# Patient Record
Sex: Female | Born: 1945 | Race: White | Hispanic: No | Smoking: Former smoker
Health system: Southern US, Community
[De-identification: ages and names within clinical notes are randomized; demographics above are authoritative.]

## PROBLEM LIST (undated history)

## (undated) DIAGNOSIS — I639 Cerebral infarction, unspecified: Secondary | ICD-10-CM

## (undated) DIAGNOSIS — G4730 Sleep apnea, unspecified: Secondary | ICD-10-CM

## (undated) DIAGNOSIS — E785 Hyperlipidemia, unspecified: Secondary | ICD-10-CM

## (undated) DIAGNOSIS — G20 Parkinson's disease: Secondary | ICD-10-CM

## (undated) DIAGNOSIS — F32A Depression, unspecified: Secondary | ICD-10-CM

## (undated) DIAGNOSIS — T4145XA Adverse effect of unspecified anesthetic, initial encounter: Secondary | ICD-10-CM

## (undated) DIAGNOSIS — N390 Urinary tract infection, site not specified: Secondary | ICD-10-CM

## (undated) DIAGNOSIS — K219 Gastro-esophageal reflux disease without esophagitis: Secondary | ICD-10-CM

## (undated) DIAGNOSIS — E559 Vitamin D deficiency, unspecified: Secondary | ICD-10-CM

## (undated) DIAGNOSIS — G459 Transient cerebral ischemic attack, unspecified: Secondary | ICD-10-CM

## (undated) DIAGNOSIS — R112 Nausea with vomiting, unspecified: Secondary | ICD-10-CM

## (undated) DIAGNOSIS — G20A1 Parkinson's disease without dyskinesia, without mention of fluctuations: Secondary | ICD-10-CM

## (undated) DIAGNOSIS — F329 Major depressive disorder, single episode, unspecified: Secondary | ICD-10-CM

## (undated) DIAGNOSIS — Z9889 Other specified postprocedural states: Secondary | ICD-10-CM

## (undated) DIAGNOSIS — F419 Anxiety disorder, unspecified: Secondary | ICD-10-CM

## (undated) DIAGNOSIS — T8859XA Other complications of anesthesia, initial encounter: Secondary | ICD-10-CM

## (undated) DIAGNOSIS — M1990 Unspecified osteoarthritis, unspecified site: Secondary | ICD-10-CM

## (undated) DIAGNOSIS — I10 Essential (primary) hypertension: Secondary | ICD-10-CM

## (undated) DIAGNOSIS — R4702 Dysphasia: Secondary | ICD-10-CM

## (undated) HISTORY — PX: WRIST SURGERY: SHX841

## (undated) HISTORY — PX: BACK SURGERY: SHX140

## (undated) HISTORY — PX: VAGINAL HYSTERECTOMY: SUR661

## (undated) HISTORY — DX: Gastro-esophageal reflux disease without esophagitis: K21.9

## (undated) HISTORY — PX: FOOT SURGERY: SHX648

## (undated) HISTORY — PX: OTHER SURGICAL HISTORY: SHX169

## (undated) HISTORY — DX: Parkinson's disease without dyskinesia, without mention of fluctuations: G20.A1

## (undated) HISTORY — DX: Parkinson's disease: G20

## (undated) HISTORY — PX: CARPAL TUNNEL RELEASE: SHX101

## (undated) HISTORY — DX: Transient cerebral ischemic attack, unspecified: G45.9

## (undated) HISTORY — PX: NECK SURGERY: SHX720

## (undated) HISTORY — PX: BURR HOLE W/ STEREOTACTIC INSERTION OF DBS LEADS / INTRAOP MICROELECTRODE RECORDING: SUR171

## (undated) HISTORY — PX: ELBOW SURGERY: SHX618

---

## 2000-02-21 ENCOUNTER — Ambulatory Visit (HOSPITAL_COMMUNITY): Admission: RE | Admit: 2000-02-21 | Discharge: 2000-02-21 | Payer: Self-pay | Admitting: Cardiology

## 2001-02-02 ENCOUNTER — Ambulatory Visit (HOSPITAL_BASED_OUTPATIENT_CLINIC_OR_DEPARTMENT_OTHER): Admission: RE | Admit: 2001-02-02 | Discharge: 2001-02-02 | Payer: Self-pay | Admitting: Orthopedic Surgery

## 2005-01-28 ENCOUNTER — Inpatient Hospital Stay (HOSPITAL_COMMUNITY): Admission: RE | Admit: 2005-01-28 | Discharge: 2005-02-02 | Payer: Self-pay | Admitting: Neurosurgery

## 2006-03-16 ENCOUNTER — Ambulatory Visit (HOSPITAL_COMMUNITY): Admission: RE | Admit: 2006-03-16 | Discharge: 2006-03-16 | Payer: Self-pay | Admitting: Neurosurgery

## 2006-05-07 ENCOUNTER — Ambulatory Visit (HOSPITAL_COMMUNITY): Admission: RE | Admit: 2006-05-07 | Discharge: 2006-05-07 | Payer: Self-pay | Admitting: Neurosurgery

## 2006-05-08 ENCOUNTER — Inpatient Hospital Stay (HOSPITAL_COMMUNITY): Admission: RE | Admit: 2006-05-08 | Discharge: 2006-05-09 | Payer: Self-pay | Admitting: Neurosurgery

## 2006-05-25 ENCOUNTER — Ambulatory Visit (HOSPITAL_COMMUNITY): Admission: RE | Admit: 2006-05-25 | Discharge: 2006-05-26 | Payer: Self-pay | Admitting: Neurosurgery

## 2007-09-23 ENCOUNTER — Ambulatory Visit (HOSPITAL_COMMUNITY): Admission: RE | Admit: 2007-09-23 | Discharge: 2007-09-23 | Payer: Self-pay | Admitting: Neurology

## 2007-10-01 ENCOUNTER — Ambulatory Visit (HOSPITAL_COMMUNITY): Admission: RE | Admit: 2007-10-01 | Discharge: 2007-10-01 | Payer: Self-pay | Admitting: Neurology

## 2007-12-06 ENCOUNTER — Ambulatory Visit: Payer: Self-pay | Admitting: Cardiology

## 2007-12-13 ENCOUNTER — Ambulatory Visit: Payer: Self-pay | Admitting: Cardiology

## 2008-01-04 ENCOUNTER — Ambulatory Visit: Payer: Self-pay | Admitting: Cardiology

## 2008-07-31 ENCOUNTER — Ambulatory Visit (HOSPITAL_COMMUNITY): Admission: RE | Admit: 2008-07-31 | Discharge: 2008-07-31 | Payer: Self-pay | Admitting: Neurology

## 2008-11-21 ENCOUNTER — Encounter: Admission: RE | Admit: 2008-11-21 | Discharge: 2008-11-21 | Payer: Self-pay | Admitting: Neurosurgery

## 2009-01-04 ENCOUNTER — Encounter: Admission: RE | Admit: 2009-01-04 | Discharge: 2009-01-04 | Payer: Self-pay | Admitting: Neurosurgery

## 2009-02-14 DIAGNOSIS — R079 Chest pain, unspecified: Secondary | ICD-10-CM | POA: Insufficient documentation

## 2009-02-14 DIAGNOSIS — E785 Hyperlipidemia, unspecified: Secondary | ICD-10-CM | POA: Insufficient documentation

## 2009-02-14 DIAGNOSIS — I10 Essential (primary) hypertension: Secondary | ICD-10-CM | POA: Insufficient documentation

## 2009-02-14 DIAGNOSIS — R0602 Shortness of breath: Secondary | ICD-10-CM | POA: Insufficient documentation

## 2009-02-14 DIAGNOSIS — E782 Mixed hyperlipidemia: Secondary | ICD-10-CM | POA: Insufficient documentation

## 2009-12-28 ENCOUNTER — Ambulatory Visit (HOSPITAL_COMMUNITY)
Admission: RE | Admit: 2009-12-28 | Discharge: 2009-12-28 | Payer: Self-pay | Source: Home / Self Care | Admitting: Neurosurgery

## 2010-06-15 ENCOUNTER — Encounter: Payer: Self-pay | Admitting: Neurosurgery

## 2010-06-16 ENCOUNTER — Encounter: Payer: Self-pay | Admitting: Neurology

## 2010-06-16 ENCOUNTER — Encounter: Payer: Self-pay | Admitting: Neurosurgery

## 2010-06-24 ENCOUNTER — Inpatient Hospital Stay (HOSPITAL_COMMUNITY)
Admission: EM | Admit: 2010-06-24 | Discharge: 2010-06-28 | DRG: 690 | Disposition: A | Discharge status: Home Health | Payer: Medicare PPO | Attending: Internal Medicine | Admitting: Internal Medicine

## 2010-06-24 DIAGNOSIS — G20 Parkinson's disease: Secondary | ICD-10-CM | POA: Diagnosis present

## 2010-06-24 DIAGNOSIS — N10 Acute tubulo-interstitial nephritis: Principal | ICD-10-CM | POA: Diagnosis present

## 2010-06-24 DIAGNOSIS — F3289 Other specified depressive episodes: Secondary | ICD-10-CM | POA: Diagnosis present

## 2010-06-24 DIAGNOSIS — A498 Other bacterial infections of unspecified site: Secondary | ICD-10-CM | POA: Diagnosis present

## 2010-06-24 DIAGNOSIS — E876 Hypokalemia: Secondary | ICD-10-CM | POA: Diagnosis present

## 2010-06-24 DIAGNOSIS — E871 Hypo-osmolality and hyponatremia: Secondary | ICD-10-CM | POA: Diagnosis present

## 2010-06-24 DIAGNOSIS — K219 Gastro-esophageal reflux disease without esophagitis: Secondary | ICD-10-CM | POA: Diagnosis present

## 2010-06-24 DIAGNOSIS — D696 Thrombocytopenia, unspecified: Secondary | ICD-10-CM | POA: Diagnosis not present

## 2010-06-24 DIAGNOSIS — I10 Essential (primary) hypertension: Secondary | ICD-10-CM | POA: Diagnosis present

## 2010-06-24 DIAGNOSIS — F329 Major depressive disorder, single episode, unspecified: Secondary | ICD-10-CM | POA: Diagnosis present

## 2010-06-24 DIAGNOSIS — G20A1 Parkinson's disease without dyskinesia, without mention of fluctuations: Secondary | ICD-10-CM | POA: Diagnosis present

## 2010-06-24 LAB — URINALYSIS, ROUTINE W REFLEX MICROSCOPIC
Bilirubin Urine: NEGATIVE
Ketones, ur: NEGATIVE mg/dL
Nitrite: POSITIVE — AB
Specific Gravity, Urine: 1.02 (ref 1.005–1.030)
Urine Glucose, Fasting: NEGATIVE mg/dL
Urobilinogen, UA: 0.2 mg/dL (ref 0.0–1.0)
pH: 6 (ref 5.0–8.0)

## 2010-06-24 LAB — COMPREHENSIVE METABOLIC PANEL
ALT: 10 U/L (ref 0–35)
AST: 19 U/L (ref 0–37)
Albumin: 3 g/dL — ABNORMAL LOW (ref 3.5–5.2)
Alkaline Phosphatase: 94 U/L (ref 39–117)
BUN: 9 mg/dL (ref 6–23)
CO2: 27 mEq/L (ref 19–32)
Calcium: 9 mg/dL (ref 8.4–10.5)
Chloride: 97 mEq/L (ref 96–112)
Creatinine, Ser: 0.8 mg/dL (ref 0.4–1.2)
GFR calc Af Amer: >60 mL/min (ref >60)
GFR calc non Af Amer: >60 mL/min (ref >60)
Glucose, Bld: 107 mg/dL — ABNORMAL HIGH (ref 70–99)
Potassium: 3.2 mEq/L — ABNORMAL LOW (ref 3.5–5.1)
Sodium: 133 mEq/L — ABNORMAL LOW (ref 135–145)
Total Bilirubin: 0.8 mg/dL (ref 0.3–1.2)
Total Protein: 6.6 g/dL (ref 6.0–8.3)

## 2010-06-24 LAB — DIFFERENTIAL
Basophils Absolute: 0 10*3/uL (ref 0.0–0.1)
Basophils Relative: 0 % (ref 0–1)
Eosinophils Absolute: 0.1 10*3/uL (ref 0.0–0.7)
Eosinophils Relative: 1 % (ref 0–5)
Lymphocytes Relative: 15 % (ref 12–46)
Lymphs Abs: 1.5 10*3/uL (ref 0.7–4.0)
Monocytes Absolute: 0.7 10*3/uL (ref 0.1–1.0)
Monocytes Relative: 8 % (ref 3–12)
Neutro Abs: 7.4 10*3/uL (ref 1.7–7.7)
Neutrophils Relative %: 76 % (ref 43–77)

## 2010-06-24 LAB — URINE MICROSCOPIC-ADD ON

## 2010-06-24 LAB — CBC
HCT: 39.2 % (ref 36.0–46.0)
Hemoglobin: 14 g/dL (ref 12.0–15.0)
MCH: 31.8 pg (ref 26.0–34.0)
MCHC: 35.7 g/dL (ref 30.0–36.0)
MCV: 89.1 fL (ref 78.0–100.0)
Platelets: 176 10*3/uL (ref 150–400)
RBC: 4.4 MIL/uL (ref 3.87–5.11)
RDW: 11.9 % (ref 11.5–15.5)
WBC: 9.7 10*3/uL (ref 4.0–10.5)

## 2010-06-24 LAB — MRSA PCR SCREENING: MRSA by PCR: NEGATIVE

## 2010-06-25 LAB — DIFFERENTIAL
Basophils Absolute: 0 10*3/uL (ref 0.0–0.1)
Basophils Relative: 0 % (ref 0–1)
Eosinophils Absolute: 0 10*3/uL (ref 0.0–0.7)
Eosinophils Relative: 0 % (ref 0–5)
Lymphocytes Relative: 18 % (ref 12–46)
Lymphs Abs: 1.5 10*3/uL (ref 0.7–4.0)
Monocytes Absolute: 0.8 10*3/uL (ref 0.1–1.0)
Monocytes Relative: 10 % (ref 3–12)
Neutro Abs: 5.7 10*3/uL (ref 1.7–7.7)
Neutrophils Relative %: 72 % (ref 43–77)

## 2010-06-25 LAB — CBC
HCT: 34.8 % — ABNORMAL LOW (ref 36.0–46.0)
Hemoglobin: 12.4 g/dL (ref 12.0–15.0)
MCH: 31.9 pg (ref 26.0–34.0)
MCHC: 35.6 g/dL (ref 30.0–36.0)
MCV: 89.5 fL (ref 78.0–100.0)
Platelets: 155 10*3/uL (ref 150–400)
RBC: 3.89 MIL/uL (ref 3.87–5.11)
RDW: 12 % (ref 11.5–15.5)
WBC: 8 10*3/uL (ref 4.0–10.5)

## 2010-06-25 LAB — BASIC METABOLIC PANEL
BUN: 7 mg/dL (ref 6–23)
CO2: 24 mEq/L (ref 19–32)
Calcium: 8.5 mg/dL (ref 8.4–10.5)
Chloride: 102 mEq/L (ref 96–112)
Creatinine, Ser: 0.92 mg/dL (ref 0.4–1.2)
GFR calc Af Amer: >60 mL/min (ref >60)
GFR calc non Af Amer: >60 mL/min (ref >60)
Glucose, Bld: 109 mg/dL — ABNORMAL HIGH (ref 70–99)
Potassium: 3.8 mEq/L (ref 3.5–5.1)
Sodium: 133 mEq/L — ABNORMAL LOW (ref 135–145)

## 2010-06-26 LAB — CBC
HCT: 35.6 % — ABNORMAL LOW (ref 36.0–46.0)
Hemoglobin: 12.5 g/dL (ref 12.0–15.0)
MCH: 31.5 pg (ref 26.0–34.0)
MCHC: 35.1 g/dL (ref 30.0–36.0)
MCV: 89.7 fL (ref 78.0–100.0)
Platelets: 149 10*3/uL — ABNORMAL LOW (ref 150–400)
RBC: 3.97 MIL/uL (ref 3.87–5.11)
RDW: 11.9 % (ref 11.5–15.5)
WBC: 6.8 10*3/uL (ref 4.0–10.5)

## 2010-06-26 LAB — URINE CULTURE
Colony Count: >=100000
Culture  Setup Time: 201201301700

## 2010-06-26 LAB — COMPREHENSIVE METABOLIC PANEL
ALT: <8 U/L (ref 0–35)
AST: 23 U/L (ref 0–37)
Albumin: 2.6 g/dL — ABNORMAL LOW (ref 3.5–5.2)
Alkaline Phosphatase: 73 U/L (ref 39–117)
BUN: 7 mg/dL (ref 6–23)
CO2: 28 mEq/L (ref 19–32)
Calcium: 8.9 mg/dL (ref 8.4–10.5)
Chloride: 99 mEq/L (ref 96–112)
Creatinine, Ser: 0.94 mg/dL (ref 0.4–1.2)
GFR calc Af Amer: >60 mL/min (ref >60)
GFR calc non Af Amer: 60 mL/min — ABNORMAL LOW (ref >60)
Glucose, Bld: 93 mg/dL (ref 70–99)
Potassium: 3.8 mEq/L (ref 3.5–5.1)
Sodium: 134 mEq/L — ABNORMAL LOW (ref 135–145)
Total Bilirubin: 0.6 mg/dL (ref 0.3–1.2)
Total Protein: 6 g/dL (ref 6.0–8.3)

## 2010-06-26 LAB — SEDIMENTATION RATE: Sed Rate: 61 mm/hr — ABNORMAL HIGH (ref 0–22)

## 2010-06-27 LAB — CBC
HCT: 35.3 % — ABNORMAL LOW (ref 36.0–46.0)
Hemoglobin: 12.4 g/dL (ref 12.0–15.0)
MCH: 31.1 pg (ref 26.0–34.0)
MCHC: 35.1 g/dL (ref 30.0–36.0)
MCV: 88.5 fL (ref 78.0–100.0)
Platelets: 183 10*3/uL (ref 150–400)
RBC: 3.99 MIL/uL (ref 3.87–5.11)
RDW: 11.7 % (ref 11.5–15.5)
WBC: 5 10*3/uL (ref 4.0–10.5)

## 2010-06-27 LAB — BASIC METABOLIC PANEL
BUN: 7 mg/dL (ref 6–23)
CO2: 27 mEq/L (ref 19–32)
Calcium: 8.8 mg/dL (ref 8.4–10.5)
Chloride: 98 mEq/L (ref 96–112)
Creatinine, Ser: 0.77 mg/dL (ref 0.4–1.2)
GFR calc Af Amer: >60 mL/min (ref >60)
GFR calc non Af Amer: >60 mL/min (ref >60)
Glucose, Bld: 133 mg/dL — ABNORMAL HIGH (ref 70–99)
Potassium: 3.4 mEq/L — ABNORMAL LOW (ref 3.5–5.1)
Sodium: 134 mEq/L — ABNORMAL LOW (ref 135–145)

## 2010-06-27 LAB — MAGNESIUM: Magnesium: 1.8 mg/dL (ref 1.5–2.5)

## 2010-06-27 LAB — DIFFERENTIAL
Basophils Absolute: 0 10*3/uL (ref 0.0–0.1)
Basophils Relative: 1 % (ref 0–1)
Eosinophils Absolute: 0.1 10*3/uL (ref 0.0–0.7)
Eosinophils Relative: 2 % (ref 0–5)
Lymphocytes Relative: 24 % (ref 12–46)
Lymphs Abs: 1.2 10*3/uL (ref 0.7–4.0)
Monocytes Absolute: 0.6 10*3/uL (ref 0.1–1.0)
Monocytes Relative: 13 % — ABNORMAL HIGH (ref 3–12)
Neutro Abs: 3.1 10*3/uL (ref 1.7–7.7)
Neutrophils Relative %: 61 % (ref 43–77)

## 2010-06-27 LAB — VITAMIN B12: Vitamin B-12: 557 pg/mL (ref 211–911)

## 2010-06-28 LAB — DIFFERENTIAL
Basophils Absolute: 0.1 10*3/uL (ref 0.0–0.1)
Basophils Relative: 1 % (ref 0–1)
Eosinophils Absolute: 0.2 10*3/uL (ref 0.0–0.7)
Eosinophils Relative: 3 % (ref 0–5)
Lymphocytes Relative: 22 % (ref 12–46)
Lymphs Abs: 1.3 10*3/uL (ref 0.7–4.0)
Monocytes Absolute: 0.8 10*3/uL (ref 0.1–1.0)
Monocytes Relative: 14 % — ABNORMAL HIGH (ref 3–12)
Neutro Abs: 3.5 10*3/uL (ref 1.7–7.7)
Neutrophils Relative %: 60 % (ref 43–77)

## 2010-06-28 LAB — BASIC METABOLIC PANEL
BUN: 6 mg/dL (ref 6–23)
CO2: 29 mEq/L (ref 19–32)
Calcium: 9 mg/dL (ref 8.4–10.5)
Chloride: 102 mEq/L (ref 96–112)
Creatinine, Ser: 0.74 mg/dL (ref 0.4–1.2)
GFR calc Af Amer: >60 mL/min (ref >60)
GFR calc non Af Amer: >60 mL/min (ref >60)
Glucose, Bld: 93 mg/dL (ref 70–99)
Potassium: 3.5 mEq/L (ref 3.5–5.1)
Sodium: 138 mEq/L (ref 135–145)

## 2010-06-28 LAB — CBC
HCT: 33.2 % — ABNORMAL LOW (ref 36.0–46.0)
Hemoglobin: 11.9 g/dL — ABNORMAL LOW (ref 12.0–15.0)
MCH: 31.6 pg (ref 26.0–34.0)
MCHC: 35.8 g/dL (ref 30.0–36.0)
MCV: 88.3 fL (ref 78.0–100.0)
Platelets: 183 10*3/uL (ref 150–400)
RBC: 3.76 MIL/uL — ABNORMAL LOW (ref 3.87–5.11)
RDW: 11.8 % (ref 11.5–15.5)
WBC: 5.8 10*3/uL (ref 4.0–10.5)

## 2010-06-28 LAB — MAGNESIUM: Magnesium: 1.8 mg/dL (ref 1.5–2.5)

## 2010-06-30 LAB — CULTURE, BLOOD (ROUTINE X 2)
Culture: NO GROWTH
Culture: NO GROWTH

## 2010-07-03 NOTE — H&P (Signed)
NAME:  Courtney Tucker, Courtney Tucker NO.:  [id number]  MEDICAL RECORD NO.:  [id number]           PATIENT TYPE:  LOCATION:                                 FACILITY:  PHYSICIAN:  Jeoffrey Massed, MD    DATE OF BIRTH:  1946/05/02  DATE OF ADMISSION: DATE OF DISCHARGE:  LH                             HISTORY & PHYSICAL   PRIMARY CARE PRACTITIONER:  Dr. Samuel Jester in [place], [place].  CHIEF COMPLAINT:  Lower abdominal pain and fever for the past 4-5 days.  HISTORY OF PRESENT ILLNESS:  The patient is a very pleasant 65 year old white female with a past medical history of Parkinson disease, hypertension, questionable ascites, TIAs, history of chronic low back pain status post 2 back surgeries was brought in with the above- mentioned complaint.  Per patient for the past 4-5 days, she has mostly been having lower abdominal pain which she describes as sharp in nature around 6/10 in intensity with no radiation.  It has no particular aggravating or relieving factors as well.  The patient does notice some amount of dysuria and polyuria with nocturia as well.  She claims that she also has a chronic low back pain; however, for the past 4-5 days, the back pain has been much worse than usual.  She describes the pain all over her lower back and not just on her mid back or nor just located to bilateral flank region.  The patient does give a history of intermittent cough; however, there is no history of chest pain or shortness of breath or palpitations.  She also gives a history of on and off headaches for the past 4-5 days.  She claims that she occasionally has loose stools, but has had none today as well.  She does give a history of nausea with no vomiting to me.  She was evaluated in the emergency room, found to be febrile and a urinalysis is suggestive of a urinary tract infection and the hospitalist service has now been asked to admit this patient for further evaluation  and treatment.  ALLERGIES: 1. ASPIRIN. 2. CODEINE.  PAST MEDICAL HISTORY:  Significant for; 1. Parkinson disease. 2. Hypertension. 3. Gastroesophageal reflux disease. 4. Depression. 5. Anxiety disorder. 6. Chronic low back pain. 7. TIA. 8. Dyslipidemia. 9. Chronic urinary incontinence.  PAST SURGICAL HISTORY: 1. L5-S1 laminectomy. 2. Deep brain stimulator implantation. 3. Hysterectomy. 4. Cholecystectomy.  MEDICATIONS AT HOME: 1. Sinemet 25/100 one tablet p.o. t.i.d. 2. Mirapex 0.5 mg 1 tablet p.o. t.i.d. 3. Nexium 40 mg p.o. daily. 4. Benicar 40 mg p.o. daily. 5. Plavix 75 mg p.o. daily. 6. Xanax 0.5 mg p.o. t.i.d. p.r.n. 7. Hydrocortisone/acetaminophen p.r.n. 8. Premarin at an unknown dose. 9. Oxybutynin at an unknown dose.  FAMILY HISTORY:  Her mother had some sort of brain tumor in her 28s. Father had coronary artery disease in his 49s.  SOCIAL HISTORY:  The patient lives with husband and denies any toxic habits.  REVIEW OF SYSTEMS:  A detailed review of 12 systems was done and these are negative except for the ones mentioned in the HPI.  PHYSICAL EXAMINATION:  VITAL SIGNS:  Initial  vital signs showed a temperature of 99.9, heart rate of 91, blood pressure 165/60, respiration of 20 and pulse ox of 95% on 2 L.  T-max in the emergency room is 101.5 degrees Fahrenheit. GENERAL:  Elderly white female lying in bed, does not appear to be in any distress.  She is awake, alert, following all commands and moving all of her extremities. HEENT:  Atraumatic, normocephalic.  Pupils equal, react to light accommodation. NECK:  Supple. CHEST:  Bilaterally clear to auscultation. CARDIOVASCULAR:  Heart sounds are regular.  No murmur is heard. ABDOMEN:  Soft.  There are good bowel sounds heard and there is diffuse lower abdominal tenderness particularly in the suprapubic area.  The rest of the abdomen is very minimally tender without any rigidity, rebound or  guarding. EXTREMITIES:  There is no edema.  She has intact sensation.  She has strong bilateral pedal pulses, which is symmetrical. NEUROLOGY:  The patient is alert, awake and oriented x3.  She is following all commands and easily able to give most of the history.  She has no focal deficits on exam.  She has intact sensation particularly in her lower extremities. MUSCULOSKELETAL:  There is diffuse tenderness in her midline in the lower back as well as in her bilateral flank region.  However, CVA angles are not tender.  LABORATORY DATA: 1. CBC shows a WBC of 9.7, hemoglobin of 14.0, hematocrit of 39.2 and     platelet count of 176. 2. Chemistry shows a sodium of 133, potassium of 3.2, chloride of 97,     bicarb of 27, glucose of 107, BUN of 9, creatinine of 0.80. 3. Total bilirubin is 0.8, alkaline phosphatase is 94, AST is 19, ALT     is 10, total protein is 6.6, albumin is 3.0 and a calcium of 9.0. 4. Urinalyses shows positive nitrites, moderate leukocytes and     microscopic analyzes shows many bacteria and too numerous to count     WBCs.  RADIOLOGICAL STUDIES:  CT of the head without contrast shows no acute intracranial abnormalities.ASSESSMENT: 1. Likely pyelonephritis. 2. History of Parkinson's, appears stable. 3. History of hypertension, relatively well controlled currently.  PLAN: 1. The patient will be admitted to regular floor. 2. She will be gently hydrated and we will provide Rocephin for her     urinary tract infection. 3. Urine cultures have already been sent to the emergency room.  If     she continues to have fever, blood cultures will also be drawn. 4. A chest x-ray and x-ray, KUB of abdomen will also be obtained. 5. We will resume her Sinemet and Mirapex for Parkinson's. 6. We will continue her Benicar for her hypertension. 7. DVT prophylaxis with Lovenox. 8. Code status full code. 9. The patient will be monitored closely and followed on a day to day      basis.  Further plan will depend as the patient's clinical     situation evolves, further laboratory data as well.  Total time spent equals 45 minutes.     Jeoffrey Massed, MD     SG/MEDQ  D:  06/24/2010  T:  06/24/2010  Job:  [id number]  cc:   Samuel Jester Fax: [phone]  Electronically Signed by Jeoffrey Massed  on 07/03/2010 04:29:43 PM

## 2010-07-05 NOTE — Discharge Summary (Signed)
NAME:  Courtney Tucker, BIENAIME NO.:  [id number]  MEDICAL RECORD NO.:  [id number]           PATIENT TYPE:  LOCATION:                                 FACILITY:  PHYSICIAN:  Elliot Cousin, M.D.    DATE OF BIRTH:  December 30, 1945  DATE OF ADMISSION:  06/24/2010 DATE OF DISCHARGE:  02/03/2012LH                              DISCHARGE SUMMARY   DISCHARGE DIAGNOSES: 1. Escherichia coli acute pyelonephritis. 2. Hypertension. 3. Thrombocytopenia, likely secondary to infection, resolved. 4. Hypokalemia. 5. Hyponatremia. 6. Parkinson's disease. 7. Mild deconditioning. 8. Transient rash.  HCTZ was considered but not confirmed as the     etiology.  DISCHARGE MEDICATIONS: 1. Levaquin 500 mg daily for 4 more days. 2. Potassium chloride 20 mEq daily for 7 more days (the patients serum     potassium and blood magnesium levels will need to be reassessed in     1-2 weeks). 3. Benicar 40 mg daily. 4. Hydrocodone/APAP 5/500 mg 1 tablet every 4 hours as needed for     pain. 5. Mirapex 0.5 mg three times daily. 6. Nexium 40 mg daily. 7. Oxybutynin 5 mg four times daily. 8. Plavix 75 mg daily. 9. Premarin 0.3 mg daily. 10.Sinemet 25/100 mg 1 tablet three times daily. 11.Trilipix 135 mg daily. 12.Viibryd 40 mg at bedtime. 13.Vitamin D3 1 tablet daily. 14.Xanax 0.5 mg half a tablet to 1 tablet b.i.d.  DISCHARGE DISPOSITION:  The patient was discharged to home in improved and stable condition on June 28, 2010.  She will follow up with her primary care physician, Dr. Samuel Jester, in 1 week.  CONSULTATIONS:  None.  PROCEDURE PERFORMED: 1. Chest x-ray on June 24, 2010.  The results revealed no acute     findings. 2. Abdominal x-ray on June 24, 2010.  The results revealed no acute     findings.  HISTORY OF PRESENT ILLNESS:  The patient is a 65 year old woman with a past medical history significant for Parkinson's disease, hypertension, depression, and anxiety.  She  presented to the emergency department on June 24, 2010, with a chief complaint of lower abdominal pain, fever, and discomfort with urination.  When she was initially evaluated, she was noted to be mildly hypertensive and febrile with a temperature of 101.5.  Her lab data were significant for a urinalysis that revealed positive nitrites and leukocytes.  She was admitted for further evaluation and management.  HOSPITAL COURSE: 1. E. COLI PYELONEPHRITIS.  A urine culture was ordered.  She received     1 g of Rocephin in the emergency department.  She was subsequently     continued on Rocephin.  After a couple of days of therapy with     Rocephin, the patient became febrile again.  The antibiotic was     changed to Ancef.  The sensitivities revealed that the E. coli was     sensitive to Levaquin.  Antibiotic therapy was subsequently changed     to oral Levaquin.  She became afebrile and remained afebrile during     the remainder of the hospitalization.  Blood cultures which had     been ordered on  admission remained negative.  She improved     symptomatically.  She was discharged to home on 4 more days of     antibiotic therapy with Levaquin after receiving 5 days of     treatment in the hospital. 2. THROMBOCYTOPENIA.  The patient's platelet count was 176 on     admission.  It decreased to a nadir of 149.  A vitamin B12 level     was ordered and it was within normal limits at 557.  Her platelet     count normalized to 183 prior to discharge. 3. HYPERTENSION.  The patient's blood pressure increased off of     Benicar.  Benicar was restarted.  Her blood pressure improved.     Hydrochlorothiazide was added empirically.  However, it was     discontinued as it may have caused a rash.  The patient did     develop a small rash on her back, which was treated with Benadryl     and a topical steroid.  It is unclear whether  the     hydrochlorothiazide was the culprit.  Nevertheless, it was      discontinued.  The patient's blood pressure improved as the Benicar     was increased back to her home dose. 4. HYPOKALEMIA AND HYPONATREMIA.  The patient's serum potassium was 3.2     and her serum sodium was 133 on admission.  She was repleted with     potassium chloride orally and in the IV fluids.  She was hydrated     with normal saline.  Her serum potassium improved to 3.5 and her     serum sodium improved to 138 prior to discharge.  She was     discharged on 7 more days of potassium chloride.  The patient's     potassium chloride level will need to be reassessed in 1-2 weeks.     This will be deferred to her primary care physician, Dr. Charm Barges. 5. PARKINSON'S DISEASE WITH DECONDITIONING.  The patient became     deconditioned due to the infection.  She was maintained on her     parkinsonian medications.  The physical therapist evaluated her.     Following the initial evaluation, the therapist     recommended skilled nursing facility short-term for strengthening.     However, over the course of the hospitalization, the patient's     strengthening and endurance improved.  The therapist no longer     recommended skilled nursing facility placement.  She rather     recommended home health physical therapy.  Home health physical     therapy was ordered.     Elliot Cousin, M.D.     DF/MEDQ  D:  06/28/2010  T:  06/29/2010  Job:  [id number]  cc:   Samuel Jester Fax: [phone]  Electronically Signed by Elliot Cousin M.D. on 07/05/2010 10:16:39 AM

## 2010-08-09 LAB — CBC
HCT: 40.4 % (ref 36.0–46.0)
Hemoglobin: 13.9 g/dL (ref 12.0–15.0)
MCH: 32.9 pg (ref 26.0–34.0)
MCHC: 34.4 g/dL (ref 30.0–36.0)
MCV: 95.7 fL (ref 78.0–100.0)
Platelets: 231 10*3/uL (ref 150–400)
RBC: 4.22 MIL/uL (ref 3.87–5.11)
RDW: 12.2 % (ref 11.5–15.5)
WBC: 6.6 10*3/uL (ref 4.0–10.5)

## 2010-08-09 LAB — BASIC METABOLIC PANEL
BUN: 15 mg/dL (ref 6–23)
CO2: 30 mEq/L (ref 19–32)
Calcium: 9.5 mg/dL (ref 8.4–10.5)
Chloride: 100 mEq/L (ref 96–112)
Creatinine, Ser: 0.71 mg/dL (ref 0.4–1.2)
GFR calc Af Amer: >60 mL/min (ref >60)
GFR calc non Af Amer: >60 mL/min (ref >60)
Glucose, Bld: 99 mg/dL (ref 70–99)
Potassium: 4.3 mEq/L (ref 3.5–5.1)
Sodium: 136 mEq/L (ref 135–145)

## 2010-08-09 LAB — SURGICAL PCR SCREEN
MRSA, PCR: NEGATIVE
Staphylococcus aureus: POSITIVE — AB

## 2010-08-09 LAB — PROTIME-INR
INR: 0.95 (ref 0.00–1.49)
Prothrombin Time: 12.9 seconds (ref 11.6–15.2)

## 2010-08-09 LAB — APTT: aPTT: 28 seconds (ref 24–37)

## 2010-10-01 ENCOUNTER — Emergency Department (HOSPITAL_COMMUNITY)
Admission: EM | Admit: 2010-10-01 | Discharge: 2010-10-01 | Disposition: A | Discharge status: Home / Self Care | Payer: Medicare PPO | Attending: Emergency Medicine | Admitting: Emergency Medicine

## 2010-10-01 ENCOUNTER — Emergency Department (HOSPITAL_COMMUNITY): Payer: Medicare PPO

## 2010-10-01 DIAGNOSIS — F3289 Other specified depressive episodes: Secondary | ICD-10-CM | POA: Insufficient documentation

## 2010-10-01 DIAGNOSIS — Z79899 Other long term (current) drug therapy: Secondary | ICD-10-CM | POA: Insufficient documentation

## 2010-10-01 DIAGNOSIS — G20 Parkinson's disease: Secondary | ICD-10-CM | POA: Insufficient documentation

## 2010-10-01 DIAGNOSIS — S8000XA Contusion of unspecified knee, initial encounter: Secondary | ICD-10-CM | POA: Insufficient documentation

## 2010-10-01 DIAGNOSIS — S7010XA Contusion of unspecified thigh, initial encounter: Secondary | ICD-10-CM | POA: Insufficient documentation

## 2010-10-01 DIAGNOSIS — G20A1 Parkinson's disease without dyskinesia, without mention of fluctuations: Secondary | ICD-10-CM | POA: Insufficient documentation

## 2010-10-01 DIAGNOSIS — Z7902 Long term (current) use of antithrombotics/antiplatelets: Secondary | ICD-10-CM | POA: Insufficient documentation

## 2010-10-01 DIAGNOSIS — Z8673 Personal history of transient ischemic attack (TIA), and cerebral infarction without residual deficits: Secondary | ICD-10-CM | POA: Insufficient documentation

## 2010-10-01 DIAGNOSIS — F329 Major depressive disorder, single episode, unspecified: Secondary | ICD-10-CM | POA: Insufficient documentation

## 2010-10-01 DIAGNOSIS — I10 Essential (primary) hypertension: Secondary | ICD-10-CM | POA: Insufficient documentation

## 2010-10-01 DIAGNOSIS — K219 Gastro-esophageal reflux disease without esophagitis: Secondary | ICD-10-CM | POA: Insufficient documentation

## 2010-10-02 ENCOUNTER — Ambulatory Visit (HOSPITAL_COMMUNITY)
Admit: 2010-10-02 | Discharge: 2010-10-02 | Disposition: A | Discharge status: Home / Self Care | Payer: Medicare PPO | Source: Ambulatory Visit | Attending: Emergency Medicine | Admitting: Emergency Medicine

## 2010-10-02 DIAGNOSIS — M79609 Pain in unspecified limb: Secondary | ICD-10-CM | POA: Insufficient documentation

## 2010-10-02 DIAGNOSIS — M7989 Other specified soft tissue disorders: Secondary | ICD-10-CM | POA: Insufficient documentation

## 2010-10-08 NOTE — Assessment & Plan Note (Signed)
[facility]                          [facility] NOTE   NAME:Courtney Tucker, Courtney Tucker                  MRN:          [id number]  DATE:01/04/2008                            DOB:          November 29, 1945    PRIMARY CARDIOLOGIST:  Luis Abed, MD, [facility]   REASON FOR VISIT:  Scheduled clinic follow up.  Please refer to my  initial consultation note of December 06, 2007, for full details.   At that time, Ms. Taffe was referred to [place] for further evaluation of  intermittent chest discomfort as well as refractory exertional dyspnea.  She had prior history of nonobstructive CAD by cardiac catheterization  in 2001.   We ordered a 2D echo and a dobutamine stress echocardiogram for risk  stratification.  2-D echo showed normal LVF with evidence of diastolic  dysfunction, and no significant valvular abnormalities.  Right  ventricular function was normal.   The dobutamine stress echo was also unremarkable, showing no evidence of  inducible wall motion abnormalities at peak stress.  There were no  diagnostic EKG changes.  Of note, the patient informs me today that she  did have some right-sided jaw discomfort during testing, but this is not  noted in the report.  She states that she similarly had jaw pain prior  to undergoing cardiac catheterization, by Dr. Andee Lineman, in 2001.  At that  time, Dr. Andee Lineman noted evidence of mildly elevated LVEDP with no wall  motion abnormalities.  He suggested further evaluation with pulmonary  function testing.  The patient tells me today that she has never had any  formal lung tests.   Clinically, she continues to have occasional chest pressure, but this is  not strictly correlated with exertion.  She has also not had any recent  jaw pain.  Although she had used some nitroglycerin prior to seeing me a  1 month ago, she has not used any since then.  She continues to have  significant exertional dyspnea and easy fatigability.   However, she  denies any PND, orthopnea, or significant lower extremity edema.   CURRENT MEDICATIONS:  Plavix, Sinemet, Cymbalta, Mirapex, Ditropan,  lisinopril/HCTZ 20/25 mg daily, Trilipix, and Zantac.   PHYSICAL EXAMINATION:  VITAL SIGNS:  Blood pressure 136/82, pulse 105  and regular, and weight 207.  GENERAL:  A 65 year old female, sitting upright, in no distress.  HEENT:  Normocephalic, atraumatic.  NECK:  Palpable carotid pulses; no JVD.  LUNGS:  Clear to auscultation in all fields.  HEART:  RRR (S1S2).  No significant murmurs.  No rubs.  ABDOMEN:  Soft, nontender.  EXTREMITIES:  No significant edema.  NEURO:  Mild generalized dyskinesia, alert and oriented.   IMPRESSION:  1. Persistent exertional dyspnea.      a.     Normal LVF.  2. Intermittent chest discomfort.      a.     Recent negative adequate dobutamine stress echocardiogram.      b.     Nonobstructive CAD by cardiac catheterization in 2001.  3. Severe Parkinson's disease.      a.     Status post deep brain  stimulation surgery in 2007.  4. Hypertension.  5. Dyslipidemia.  6. History of mini strokes.      a.     Treated with Plavix, followed by Dr. Avie Echevaria.   PLAN:  1. Formal pulmonary function tests with DLCO.  Two-view chest x-ray.      Check BNP level.  2. Return clinic follow up with myself and Dr. Myrtis Ser in 2 months.      Rozell Searing, PA-C  Electronically Signed      Luis Abed, MD, [facility]  Electronically Signed   GS/MedQ  DD: 01/04/2008  DT: 01/05/2008  Job #: [id number]   cc:   Samuel Jester

## 2010-10-08 NOTE — Assessment & Plan Note (Signed)
[facility]                          [facility]ICE NOTE   NAME:Laidler, KAE LAUMAN                  MRN:          [id number]  DATE:12/06/2007                            DOB:          28-Jul-1945    PRIMARY CARDIOLOGIST:  Luis Abed, MD, [facility] (new)   REASON FOR CONSULTATION:  Ms. Meares is a 65 year old female with  history of nonobstructive CAD by previous cardiac catheterization, by  Dr. Lewayne Bunting, in September 2001.  She has several cardiac risk factors  and recently complained of some intermittent chest discomfort to Dr.  Samuel Jester.  She thus now presents to Dr. Willa Rough for further  evaluation and recommendations.   Ms. Deuser's risk factors are notable for hypertension, dyslipidemia,  age, and family history.  She has not had any subsequent cardiac  testing, since undergoing a diagnostic coronary angiogram in 2001.  At  that time, she was referred to Dr. Andee Lineman for evaluation of jaw pain.  Her symptoms were worrisome for ischemic heart disease and Dr. Andee Lineman  recommended an invasive workup.  However, this suggested essentially  normal coronary arteries with preserved left ventricular function.  There was also no evidence of peripheral vascular disease, by distal  aortography.  There was a mild elevation of LVEDP.  No further workup  was recommended and the patient reports no subsequent stress testing  since that procedure.   Over the past few months, Ms. Whisonant has noted development of new-  onset exertional dyspnea which has become much more frequent and  significant.  In this context, she has also developed some anterior  chest discomfort described as a heaviness with some radiation to the  right shoulder, but not to the jaw.  There is no radiation down either  arm, as well.  She does have some mild nausea, but no vomiting.  These  episodes are entirely unpredictable, always occurring at rest.  In fact,  she  denies any exertion-induced symptoms.  Of note, however, she is  significantly limited in her mobility, given that she has severe  Parkinson disease.  She does ambulate with the use of a 4-point walker.   Ms. Brocks did receive a prescription for sublingual nitroglycerin  spray in the recent past.  She did use this on only one occasion, with  some amelioration of her chest discomfort.  Again, the episode occurred  while at rest.   Also of note, the patient does suggest some exacerbation of her chest  discomfort with either movement or deep inspiration.   An EKG in our office today indicates an SR at 97 bpm with normal axis  and no acute changes.  At the baseline is uninterpretable in lead V1,  secondary to resting tremor.   ALLERGIES:  1. ASPIRIN intolerance (severe GI upset).  2. CODEINE.   CURRENT MEDICATIONS:  1. Sinemet 50/200 b.i.d.  2. Prilosec.  3. Cymbalta.  4. Mirapex 0.5 b.i.d.  5. Plavix.  6. Ditropan.  7. Lisinopril/HCTZ daily.  8. Premarin.  9. Cholesterol medication.   PAST MEDICAL HISTORY:  1. Parkinson's disease.      a.  Status post deep brain stimulation surgery, December 2007,       at [facility].  2. Hypertension.  3. Dyslipidemia.  4. Possible mini strokes.      a.     Diagnosed by Dr. Avie Echevaria, treated with Plavix.  5. Lower back surgery.   SOCIAL HISTORY:  The patient is married, has 3 children.  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  She has not smoked in 40 years.   FAMILY HISTORY:  Father died at age 76, fatal myocardial infarction.   REVIEW OF SYSTEMS:  As noted per HPI, remaining systems are negative.   PHYSICAL EXAMINATION:  VITAL SIGNS:  Blood pressure 150/94, pulse 97 and  regular, weight 209.  GENERAL:  A 65 year old female sitting upright, in no distress.  HEENT:  Normocephalic and atraumatic.  NECK:  Palpable bilateral carotid pulses without bruits; no JVD.   LUNGS:  Clear to auscultation in all fields.  HEART:  Regular rate and rhythm (S1S2).  No significant murmurs.  No  rubs or gallops.  ABDOMEN:  Soft, nontender, intact bowel sounds.  EXTREMITIES:  Palpable dorsalis pedis pulses with trace pedal edema.  NEUROLOGIC:  Alert and oriented.  Mild generalized dyskinesia with  associated resting tremor of the right arm.   IMPRESSION:  1. Exertional dyspnea.  2. Intermittent chest discomfort.      a.     No clear association with activity or exertion.      b.     Nonobstructive CAD by cardiac catheterization in 2001.      c.     Preserved LVF.  3. Severe Parkinson disease.      a.     Status post deep brain stimulation surgery in 2007 [facility]).  4. Hypertension.  5. Dyslipidemia.  6. History of mini strokes.      a.     Treated with Plavix.   PLAN:  1. Baseline 2D echo for reassessment of LV function and rule out of      underlying structural abnormalities.  2. Dobutamine stress echocardiogram for risk stratification.  3. Schedule early clinic followup with myself and Dr. Myrtis Ser in 1 month,      for review of study results and further recommendations.      Rozell Searing, PA-C  Electronically Signed      Luis Abed, MD, [facility]  Electronically Signed   GS/MedQ  DD: 12/06/2007  DT: 12/07/2007  Job #: [id number]   cc:   Samuel Jester

## 2010-10-11 NOTE — Discharge Summary (Signed)
NAME:  Courtney Tucker, Courtney Tucker           ACCOUNT NO.:  [id number]   MEDICAL RECORD NO.:  [id number]          PATIENT TYPE:  INP   LOCATION:  3027                         FACILITY:  [facility]   PHYSICIAN:  Stefani Dama, M.D.  DATE OF BIRTH:  1945/12/08   DATE OF ADMISSION:  01/28/2005  DATE OF DISCHARGE:  02/02/2005                                 DISCHARGE SUMMARY   ADMITTING DIAGNOSES:  1.  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  2.  Parkinson's disease.   FINAL DIAGNOSES:  1.  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  2.  Parkinson's disease.   CONDITION ON DISCHARGE:  Improving.   HOSPITAL COURSE:  [name] is a 65 year old individual who has  had significant back and bilateral leg pain. She has severe spondylitic  disease at the L5-S1 level and she was taken to the operating room on the  day of admission where she underwent decompression with laminectomy,  diskectomy, posterolateral arthrodesis at the L5-S1 level with interbody  fixation and fusion also being performed.   Postoperatively, the patient had considerable amount of back pain and leg  pain. She was complaining of substantial spasms which may have been an  activation of her parkinsonism. She was restarted on all her preoperative  parkinsonian medications. Despite this, she continued to have significant  difficulties. She was given Valium and it seemed to calm her condition down;  however, she required 5 milligrams on a regular six-hour basis. She cannot  tolerate any narcotic analgesics and for pain control she used Darvocet. At  the time of discharge, she was given prescriptions for both Darvocet and  Valium.   Discharge medications are the same as the admitting medications which  includes Sinemet CR 50/200 one daily, meclizine 25 milligrams a day, Xanax  0.5 milligrams b.i.d., Sinemet CR dosed as the  patient does at home, Mirapex  0.5 milligrams q.i.d., omeprazole 20 milligrams daily, Cymbalta 60  milligrams b.i.d., Seroquel 200 milligrams h.s., Tylenol, Lasix 40  milligrams daily, and Maxalt 10 milligrams on a p.r.n. basis.   CONDITION ON DISCHARGE:  Improving.      Stefani Dama, M.D.  Electronically Signed     HJE/MEDQ  D:  02/02/2005  T:  02/03/2005  Job:  [id number]

## 2010-10-11 NOTE — Op Note (Signed)
NAME:  Courtney Tucker, Courtney Tucker           ACCOUNT NO.:  [id number]   MEDICAL RECORD NO.:  [id number]          PATIENT TYPE:  AMB   LOCATION:  SDS                          FACILITY:  [facility]   PHYSICIAN:  Danae Orleans. Venetia Maxon, M.D.  DATE OF BIRTH:  Jul 04, 1945   DATE OF PROCEDURE:  05/25/2006  DATE OF DISCHARGE:                               OPERATIVE REPORT   PREOPERATIVE DIAGNOSIS:  Parkinson's disease, status post stage I deep  brain stimulator placement.   POSTOPERATIVE DIAGNOSIS:  Parkinson's disease, status post stage I deep  brain stimulator placement.   PROCEDURE:  Stage II deep brain stimulator placement with the  implantable pulse generator and extensions.   SURGEON:  Danae Orleans. Venetia Maxon, M.D.   ANESTHESIA:  General endotracheal anesthesia.   ESTIMATED BLOOD LOSS:  Minimal.   COMPLICATIONS:  None.   DISPOSITION:  Recovery.   INDICATIONS:  [name] is a 65 year old woman with Parkinson's  disease.  She had previously undergone stage I bilateral subthalamic  nucleus stereotactically placed deep brain stimulating electrodes and  this was stage II of planned scheduled staged procedure to implant  extensions and pulse generator.   PROCEDURE:  [name] is brought to the operating room.  She was  placed in the supine position on the operating table.  Her staples from  previous incision were removed.  Her scalp and right upper chest were  then prepped and draped in the usual sterile fashion.  Area of planned  incision was infiltrated with 0.25% Marcaine and 0.5% lidocaine  1:200,000 epinephrine.  Incision was made overlying the previously  placed extensions and then these were exposed.  A separate incision was  made overlying the right supraclavicular region just two fingerbreadths  below the clavicle and subcutaneous tunnel was created for pocket.  Extensions were then tunneled to the pocket and brought to the cranial  incision.  The Silastic covers overlying the extensions  were then  removed and Silastic covers over the electrodes were removed and the  extensions were then connected to the electrodes with new Silastic plug  covers placed.  The torque wrench was used to tighten all connections.  The 2-0 silk ties were placed at either end of the Silastic covers.  The  redundant electrode was then circularized and then after connecting to  the connector battery this was then inserted into the subcutaneous  pocket and after had been torqued tightened appropriately, the wounds  were then closed with 2-0 Vicryl sutures and a 3-0 nylon stitch at the  cranial end and 3-0 subcuticular Vicryl over the upper chest wounds were  dressed with sterile occlusive dressings and the patient was extubated  in the operating  room, taken to the recovery in stable satisfactory condition having  tolerated operation well.  Counts correct at end the case.  Of note, the  right electrode was placed, has a white cover. the left elected to  extension connection and the left-sided electrode was clear.  The left  is 0 through 3, right is 4 through 7 electrodes.      Danae Orleans. Venetia Maxon, M.D.  Electronically Signed  JDS/MEDQ  D:  05/25/2006  T:  05/25/2006  Job:  [id number]

## 2010-10-11 NOTE — Op Note (Signed)
NAME:  Courtney Tucker, FAIT           ACCOUNT NO.:  [id number]   MEDICAL RECORD NO.:  [id number]          PATIENT TYPE:  INP   LOCATION:  NA                           FACILITY:  [facility]   PHYSICIAN:  Danae Orleans. Venetia Maxon, M.D.  DATE OF BIRTH:  June 01, 1945   DATE OF PROCEDURE:  05/08/2006  DATE OF DISCHARGE:                               OPERATIVE REPORT   PREOPERATIVE DIAGNOSIS:  Parkinson's  disease.   POSTOPERATIVE DIAGNOSIS:  Parkinson's  disease.   PROCEDURES:  Bilateral frameless stereotactic MRI and CT-guided  subthalamic nucleus deep brain stimulating electrode placement for  Parkinson's.   SURGEON:  Danae Orleans. Venetia Maxon, M.D.   ASSISTANT:  Georgiann Cocker, RN.   ANESTHESIA:  MAC with IV sedation and local anesthesia.   ESTIMATED BLOOD LOSS:  Minimal.   COMPLICATIONS:  None.   DISPOSITION:  Recovery.   INDICATIONS:  Courtney Tucker was a 65 year old woman with Parkinson's  disease, right side dominant more than left, but also with some left-  sided rigidity and bradykinesia.  It was elected to take her to surgery  for bilateral subthalamic nucleus deep brain stimulating electrode  placement procedure.  Courtney Tucker had previously undergone an MRI  without fiducials.  She came to the office 2 days prior to surgery and  fiducial markers were placed in her scalp.  She then underwent a stealth-  guided CT scan prior to her operation.  The CT and MRI data sets were  merged and surgical planning was performed for electrode placement in  the subthalamic nucleus bilaterally.   PROCEDURE:  [name] was brought to the operating room.  She was  placed in a supine position on the operating table.  Her neck was placed  in neutral alignment and a cervical collar attached to the [place]  frame.  Her soft tissue and bony prominences were padded appropriately,  and she was placed in the semi-sitting position.  Subsequently the scalp  was registered with the clip-on cup frame and stereotactic  registration  system with self, and fiducials were marked (the accuracy was 0.5 mm).  It was therefore elected to proceed with the surgical procedure.   The entry points were marked on the scalp, and then after prepping in  the usual sterile fashion these points were infiltrated with local  lidocaine.  An 11 blade was used to make a stab incision and the twist  drill screw hole was placed to identify the entry point on the skull.  Subsequently, the scalp was prepped and draped in the usual sterile  fashion with a [place] drape and a linear incision was made after  infiltrating the skin and subcutaneous tissues.  The linear incision was  made posterior to the scalp entry points.  The Rayne clips were applied.  The scalp flap was brought forward.  The 14  mm perforator was used to  produce 2 bur holes, and the StemLock  caps were placed.  Initially on  the left side the tower was built and using the Stealth system. After re-  registering the fiducials, the dura was incised in a cruciate fashion  and peel  was cauterized with bipolar electrocautery.  Subsequently,microelectrode recordings were made to the left STN  nucleus, with excellent recordings from about 5 mm above target to 1 mm  below target.  Subsequently the stimulating electrode was placed, and at  relatively low voltage the patient had transient paresthesias in the  right hand and tolerated the voltage up to 3 -- with excellent relief of  rigidity and bradykinesia.  She saw a significant difference in her hand  function and the way it felt, and her leg was also looser.  The  electrode was then locked into position with a StemLock cap, and  subsequently the tower was disassembled.  The cap over the [facility] was  then placed, and the redundant electrode was circularized.  A cap was  placed over this, along with Silastic sleeve (which was anchored with a  2-0 silk ties); and this was placed in the right subgaleal postauricular   region.  Subsequently identical procedure was done on the right side.  On the right side initial microelectrode recordings were suboptimal.  Trajectory was moved anteriorly 2 mm, and again they were not ideal.  Subsequently, after a lateral trajectory produced excellent recordings,  2 mm from the initial target, with very good STN recordings from 5 mm  above target to 1.5 mm below target.  The stimulating electrode was  placed, and with this location there was excellent relief of rigidity  bradykinesia and with transient paresthesias in the left hand.  Again,  the electrode was then locked into position, circularized; Silastic cap  was placed over it.  It was also placed and tunneled into the right  posterior equil region.  This cover was cut short to be able to identify  the right versus left electrodes.  The wounds were then irrigated with  bacitracin and saline.  The Raney clips were removed.  The galea was  reapproximated with 2-0 Vicryl sutures and the skin edges were  reapproximated with staples.  The fiducial markers were removed and a  sterile occlusive head wrap was placed.  The patient was taken to the  recovery room in stable, satisfactory condition -- having tolerated the  procedure well.      Danae Orleans. Venetia Maxon, M.D.  Electronically Signed     JDS/MEDQ  D:  05/08/2006  T:  05/08/2006  Job:  [id number]

## 2010-10-11 NOTE — Op Note (Signed)
NAME:  Courtney Tucker, Courtney Tucker NO.:  [id number]   MEDICAL RECORD NO.:  [id number]          PATIENT TYPE:  INP   LOCATION:  3027                         FACILITY:  [facility]   PHYSICIAN:  Payton Doughty, M.D.      DATE OF BIRTH:  03/30/1946   DATE OF PROCEDURE:  01/28/2005  DATE OF DISCHARGE:                                 OPERATIVE REPORT   PREOPERATIVE DIAGNOSIS:  Spondylosis L5-S1.   POSTOPERATIVE DIAGNOSIS:  Spondylosis L5-S1.   OPERATION/PROCEDURE:  1.  L5-S1 laminectomy, diskectomy.  2.  Back fusion with left-sided Ray threaded fusion cage and a non-segmental      pedicle screw fixation at L5-S1.  3.  Posterolateral arthrodesis at L5-S1.   SURGEON:  Payton Doughty, M.D.   ANESTHESIA:  General endotracheal anesthesia.   PREPARATION:  Prepped and draped in usual fashion and skin cleansed with  alcohol wipe.   COMPLICATIONS:  None.   ASSISTANT:  Nurse assistant:  Basilia Jumbo.  Doctor assistant:  Danae Orleans. Venetia Maxon, M.D.   DESCRIPTION OF PROCEDURE:  This is a 65 year old lady with severe lumbar  spondylosis.  She was taken to the operating room, anesthetized, intubated  and placed prone on the operating table.  Following sterile prep and drape  in the usual sterile fashion, skin was infiltrated with 1% lidocaine with  1:400,000 epinephrine. The skin was incised from the mid L4 to mid S1 and  the lamina of L5 and S1 were exposed bilaterally in the subperiosteal plane  including the transverse processes and sacral ala.  Intraoperative x-ray  confirmed correct level.  The pars intra-articularis lamina and inferior  facet of L5 and the superior facet of S1 were removed bilaterally using the  high-speed drill.  On the right side, the anatomy was fairly abnormal.  The  L5 root diverted from the sac very low and came straight across the disk  space.  On the left side there was severe degenerative change but the L5  root had enough of an axilla that placement of the Ray cages was  feasible.  On the right side, the nerve roots were decompressed but could not be  mobilized.  Following placement of the left sided cage, the pedicles screws  were placed at L5 and S1 using standard landmarks.  Intraoperative x-rays  show good placement of the pedicle screws.  They were attached to the rods.  The sacral ala and transverse processes were decorticated and infused bone  morphogenetic protein was  placed over them on the carrier matrix.  The caps were tightened on the  rods.  Successful layers of 0 Vicryl and 2-0 Vicryl were used to close the  fascia and subcutaneous tissue and skin was closed with 3-0 Vicryl in  running locked fashion.  Betadine and Telfa dressing was applied and patient  returned to the recovery room in good condition.           ______________________________  Payton Doughty, M.D.     MWR/MEDQ  D:  01/28/2005  T:  01/28/2005  Job:  [id number]

## 2010-10-11 NOTE — Op Note (Signed)
NAME:  Courtney Tucker, Courtney Tucker           ACCOUNT NO.:  [id number]   MEDICAL RECORD NO.:  [id number]          PATIENT TYPE:  INP   LOCATION:  NA                           FACILITY:  [facility]   PHYSICIAN:  Payton Doughty, M.D.      DATE OF BIRTH:  16-May-1946   DATE OF PROCEDURE:  DATE OF DISCHARGE:                                 OPERATIVE REPORT   Audio too short to transcribe (less than 5 seconds)           ______________________________  Payton Doughty, M.D.     MWR/MEDQ  D:  01/28/2005  T:  01/28/2005  Job:  [id number]

## 2010-10-11 NOTE — H&P (Signed)
NAME:  Courtney Tucker, Courtney Tucker              ACCOUNT NO.:  [id number]   MEDICAL RECORD NO.:  [id number]           PATIENT TYPE:   LOCATION:                               FACILITY:  [facility]   PHYSICIAN:  Payton Doughty, M.D.      DATE OF BIRTH:  12/28/1950   DATE OF ADMISSION:  01/28/2005  DATE OF DISCHARGE:                                HISTORY & PHYSICAL   ADMITTING DIAGNOSIS:  Spondylosis, L5-S1.   SERVICE:  Neurosurgery.   HISTORY OF PRESENT ILLNESS:  This is a 65 year old right-handed white lady  who underwent an anterior decompression and fusion 8 years ago and did well.  The past year, she developed pain in her back that has been worsening and  getting down into her legs.  If she gets up and walks very far, both legs  bother her.  She has been getting epidural steroids which helped  transiently.  MR shows spondylosis at 4-5 and 5-1, depending upon how you  read it, and she was referred to me.   MEDICAL HISTORY:  1.  Parkinson's disease.  2.  Depression.   MEDICATIONS:  1.  Sinemet CR 50/200 mg twice a day.  2.  Mirapex 0.5 mg three times a day.  3.  Cymbalta 120 mg a day.  4.  Seroquel 200 mg a day.  5.  Prilosec 20 mg a day.  6.  Oxybutynin 5 mg a day.   ALLERGIES:  ASPIRIN and IBUPROFEN.   SURGICAL HISTORY:  1.  Foot surgery in '04.  2.  Carpal tunnel in '03.  3.  Anterior cervical diskectomy in 1997.   SOCIAL HISTORY:  She does not smoke, drinks socially and is on disability  because of Parkinson's and depression.   FAMILY HISTORY:  Mom died at 64 of brain tumor.  Daddy died at 61 of an MI.   REVIEW OF SYSTEMS:  Review of systems remarkable for glasses, balance  disturbance, nasal congestion, leg pain, leg weakness, back pain, leg pain,  arthritis, __________  memory and depression.   PHYSICAL EXAMINATION:  HEENT:  Exam within normal limits.  NECK:  She has a well-healed scar on her neck with good range of motion.  CHEST:  Clear.  CARDIAC:  Regular rate and rhythm.  ABDOMEN:  Nontender with no hepatosplenomegaly.  EXTREMITIES:  Without clubbing or cyanosis.  GU:  Exam is deferred.  PERIPHERAL VASCULAR:  Peripheral pulses are good.  NEUROLOGIC:  She is awake, alert and oriented.  Her cranial nerves are  intact.  Motor exam shows 5/5 strength throughout the upper and lower  extremities.  There are sensory dysesthesias in the L5 distribution  bilaterally.  Reflexes are 1 at the knees, __________ at the ankles and toes  are downgoing bilaterally.  Straight leg raise is negative.   IMAGING STUDIES:  MR shows degenerative changes that was read at 4-5, but  actually I believe the level is 5-1 because when viewing the axial images,  it appears what is being called L5 is actually a sacralized segment.  In any  event, the most degenerated disk  space is at the apex with lumbar lordosis.   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   PLAN:  Plan is for a lumbar laminectomy, diskectomy, posterior lumbar  interbody fusion procedure with Ray threaded fusion cages, posterolateral  arthrodesis and pedicle fixation.  The risks and benefits have been  discussed with her and she wishes to proceed.           ______________________________  Payton Doughty, M.D.     MWR/MEDQ  D:  01/28/2005  T:  01/28/2005  Job:  [id number]

## 2010-10-11 NOTE — Op Note (Signed)
[name]  Patient:    Courtney Tucker, Courtney Tucker Visit Number: [id number] MRN: [id number]          Service Type: DSU Location: [facility] Attending Physician:  Susa Day Dictated by:   Katy Fitch Naaman Plummer., M.D. Proc. Date: 02/02/01 Admit Date:  02/02/2001                             Operative Report  PREOPERATIVE DIAGNOSIS:  Entrapment neuropathy, median nerve, right carpal tunnel.  POSTOPERATIVE DIAGNOSIS:  Entrapment neuropathy, median nerve, right carpal tunnel.  OPERATION PERFORMED:  Release of right transverse carpal ligament.  OPERATING SURGEON:  Josephine Igo, M.D.  ASSISTANT:  Annye Rusk, P.A-C.  ANESTHESIA:  General by mask.  ANESTHESIOLOGIST:  Dr. Gelene Mink.  INDICATIONS:  The patient is a 65 year old woman with a history of carpal tunnel syndrome.  She has had numbness in the median enervated fingers and symptoms unrelieved by splinting, activity modification and anti-inflammatory medication.  Electrodiagnostic studies have confirmed median neuropathy at the right wrist level.  Due to failure to respond to nonoperative measures, the patient is brought to the operating room at this time for release of her right transverse carpal ligament.  DESCRIPTION OF PROCEDURE:  Imogene Gravelle was brought to the operating room and placed in supine position on the operating table.  Following induction anesthesia, the right arm was prepped with Betadine soap and solution and sterilely draped.  Following exsanguination of the limb with an Esmarch bandage, the arterial tourniquet was inflated to 220 mmHg.  The procedure commenced with a short incision in line of the ring finger in the palm.  The subcutaneous tissues were carefully divided revealing the palmar fascia.  This was split longitudinally to reveal the common sensory branch of the median nerve.  These were followed back to the transverse carpal ligament which was  carefully separated from the median nerve.  The ligament was released on its ulnar border extending into the distal forearm.  This widely opened the carpal canal.  No masses or other predicaments were noted.  Bleeding points along the margin of the transverse carpal ligament were electrocauterized with bipolar current.  The wound was repaired with intradermal 3-0 Prolene suture.  A compressive dressing was applied with a volar plaster splint maintaining the wrist in 5 degrees dorsiflexion. Dictated by:   Katy Fitch Naaman Plummer., M.D. Attending Physician:  Susa Day DD:  02/02/01 TD:  02/02/01 Job: [id number]

## 2010-10-11 NOTE — Cardiovascular Report (Signed)
[place]  Patient:    Courtney Tucker, Courtney Tucker                    MRN: [id number] Proc. Date: 02/21/00 Adm. Date:  [id number] Disc. Date: [id number] Attending:  Learta Codding CC:         Murlean Hark, M.D., [facility], [facility], [place]. Jerolyn Shin, [place], [place]   Cardiac Catheterization  DATE OF BIRTH:  December 05, 1945.  REFERRING PHYSICIAN:  Dr. Murlean Hark, [place], [place].  PROCEDURES PERFORMED: 1. Left heart catheterization with selective coronary angiography. 2. Ventriculography. 3. Distal aortography.  DIAGNOSES: 1. No evidence of a flow-limiting coronary artery disease. 2. Normal left ventricular systolic function. 3. No evidence of distal peripheral vascular disease.  HISTORY:  Courtney Tucker is a 65 -year-old white female with multiple cardiac risk factors who presented with new onset exertional substernal chest pain. The patient has now been referred for a diagnostic catheterization to assess her coronary anatomy.  DESCRIPTION OF PROCEDURE:  After informed consent was obtained, the patient was brought to the catheterization laboratory.  The right groin was sterilely prepped and draped.  Then 1% Lidocaine was used to infiltrate the right groin and a 6-French arterial sheath was placed using the modified Seldinger technique.  Subsequently, 6-French preformed JL-4 and JR-4 catheters were used to engage the left and right coronary arteries.  Selective coronary angiography was performed in various projections using manual injections of contrast.  After selective coronary angiography, ventriculography was performed using an angled 6-French pigtail catheter.  The pigtail catheter was placed in the left ventricle and appropriate left side hemodynamics were obtained.  Subsequently, ventriculography was performed in single plane RAO projection using power injections of contrast.  Subsequently, the  pigtail catheter was removed and assessment was made for an aortic valve gradient.  At the termination of the case the catheters and sheaths were removed and manual pressure applied until adequate hemostasis was achieved.  The patient tolerated the procedure well and was transferred to the holding area in stable condition.  FINDINGS:  HEMODYNAMICS: 1. Left ventricular pressure 161/20 mmHg. 2. Aortic pressure 161/86 mmHg.  VENTRICULOGRAPHY:  Ejection fraction quantified to 50-55% with no segmental wall motion abnormalities.  SELECTIVE CORONARY ANGIOGRAPHY:  Left main coronary artery:  There was no evidence of flow-limiting coronary artery disease.  This was a large caliber vessel.  Left anterior descending artery was a large caliber vessel with no evidence of flow-limiting coronary artery disease.  There were two diagonal branches arising from the LAD.  Left circumflex coronary artery showed no evidence of flow-limiting disease. There were two obtuse marginal branches arising from the circumflex coronary artery, as well as a large posterolateral branch.  Right coronary artery was dominant with a large posterior descending artery. There was no evidence of flow-limiting disease in this distribution.  CONCLUSIONS: 1. No  flow-limiting coronary artery disease. 2. Normal left ventricular systolic function. 3. Mildly elevated left ventricular end diastolic pressure. 4. No evidence of peripheral vascular disease assessed by distal aortography.  RECOMMENDATIONS:  Results have been discussed with the patient.  The [facility] [place] [facility] checkup.  The patients symptoms of shortness of breath and chest pain on exertion are not explained by the findings on coronary angiography.  Further work-up may be indicated, including a pulmonary function test.  It has been discussed with the patient and she  will follow up with her primary care physician,  Dr. Leonel Ramsay. DD:  02/21/00 TD:  02/21/00 Job: [id number]

## 2011-02-19 LAB — BUN: BUN: 15

## 2011-02-19 LAB — CREATININE, SERUM
Creatinine, Ser: 0.76
GFR calc Af Amer: >60
GFR calc non Af Amer: >60

## 2011-02-24 ENCOUNTER — Other Ambulatory Visit: Payer: Self-pay | Admitting: Neurosurgery

## 2011-02-24 DIAGNOSIS — M47816 Spondylosis without myelopathy or radiculopathy, lumbar region: Secondary | ICD-10-CM

## 2011-03-27 ENCOUNTER — Other Ambulatory Visit: Payer: Self-pay | Admitting: Neurosurgery

## 2011-03-27 DIAGNOSIS — M47816 Spondylosis without myelopathy or radiculopathy, lumbar region: Secondary | ICD-10-CM

## 2011-04-01 ENCOUNTER — Ambulatory Visit
Admission: RE | Admit: 2011-04-01 | Discharge: 2011-04-01 | Disposition: A | Discharge status: Home / Self Care | Payer: Medicare Other | Source: Ambulatory Visit | Attending: Neurosurgery | Admitting: Neurosurgery

## 2011-04-01 DIAGNOSIS — M47816 Spondylosis without myelopathy or radiculopathy, lumbar region: Secondary | ICD-10-CM

## 2011-04-01 MED ORDER — METHYLPREDNISOLONE ACETATE 40 MG/ML INJ SUSP (RADIOLOG
120.0000 mg | Freq: Once | INTRAMUSCULAR | Status: AC
  Administered 2011-04-01: 120 mg via EPIDURAL

## 2011-04-01 MED ORDER — IOHEXOL 180 MG/ML  SOLN
1.0000 mL | Freq: Once | INTRAMUSCULAR | Status: AC | PRN
  Administered 2011-04-01: 1 mL via EPIDURAL

## 2012-10-10 ENCOUNTER — Other Ambulatory Visit: Payer: Self-pay

## 2012-10-10 MED ORDER — CLOPIDOGREL BISULFATE 75 MG PO TABS: 75.0000 mg | ORAL_TABLET | Freq: Every day | ORAL | Status: DC

## 2012-10-10 NOTE — Telephone Encounter (Signed)
Former [facility] patient assigned to Dr Athar.  

## 2012-10-15 ENCOUNTER — Ambulatory Visit (INDEPENDENT_AMBULATORY_CARE_PROVIDER_SITE_OTHER): Payer: Medicare Other | Admitting: Neurology

## 2012-10-15 ENCOUNTER — Encounter: Payer: Self-pay | Admitting: Neurology

## 2012-10-15 VITALS — BP 118/80 | HR 88 | Temp 97.9°F | Resp 20

## 2012-10-15 DIAGNOSIS — G20 Parkinson's disease: Secondary | ICD-10-CM | POA: Insufficient documentation

## 2012-10-15 DIAGNOSIS — R4702 Dysphasia: Secondary | ICD-10-CM | POA: Insufficient documentation

## 2012-10-15 DIAGNOSIS — G20A1 Parkinson's disease without dyskinesia, without mention of fluctuations: Secondary | ICD-10-CM

## 2012-10-15 DIAGNOSIS — R4789 Other speech disturbances: Secondary | ICD-10-CM

## 2012-10-15 DIAGNOSIS — R269 Unspecified abnormalities of gait and mobility: Secondary | ICD-10-CM

## 2012-10-15 DIAGNOSIS — F482 Pseudobulbar affect: Secondary | ICD-10-CM | POA: Insufficient documentation

## 2012-10-15 HISTORY — PX: PR ANALYZE NEUROSTIM BRAIN, FIRST 1H: 95978

## 2012-10-15 MED ORDER — DEXTROMETHORPHAN-QUINIDINE 20-10 MG PO CAPS: ORAL_CAPSULE | ORAL | Status: DC

## 2012-10-15 NOTE — Progress Notes (Addendum)
Courtney Tucker was seen today in the movement disorders clinic for neurologic consultation.   The consultation is for the evaluation of PD.  I reviewed past med records made available to me, which were primarily surgical notes from Dr. Vertell Limber.  The first symptom(s) the patient noticed was R hand tremor when she was about 67 y/o.  She went to a local neurologist (Dr. 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  Not long thereafter (maybe a year) she saw Dr. Erling Cruz for her neck and when she was checking out, she looked down at the checkout paper and saw that PD was written as a dx.  She ended up with several other neurologists, including a physician at [facility].  They concurred with the dx of PD.  She ultimately went back to Dr. Erling Cruz and she was started on levodopa in her early to mid 1's.  She was initially put on the IR but it caused nausea.  She was changed to the CR and she did better.  She states that mirapex was ultimately added, but she uses it for cramping and RLS.  She has tried to cut back on that with time.   The pt is s/p DBS in the b/l STN with initial battery 05/25/2006.  She had a battery change on 12/28/2009.  The patient finds DBS helpful.  If the battery is off, she has extreme tremor.    Specific Symptoms:  Tremor: yes, rarely in the L hand (but mostly controlled with DBS) Voice: yes, softer and slower and slurred.  Last ST was about 6 months ago. Sleep: doing well right now  Vivid Dreams:  no  Acting out dreams:  yes (sleep talking, screaming).  Klonopin has helped Wet Pillows: no Postural symptoms:  yes  Falls?  yes (last fall 08/2012; in past has fx both arms with fall) Bradykinesia symptoms: slow movements, reduced facial expressions and difficulty getting out of a chair Loss of smell:  no Loss of taste:  no Urinary Incontinence:  yes, wears depends and on ditropan Difficulty Swallowing:  yes Handwriting, micrographia: yes Trouble with ADL's:  yes (has woman come help  her 3 days per week; pt can do it but it takes long time)  Trouble buttoning clothing: yes Depression:  yes (primarily related to family/children stress).  Significant crying without always being sad.   Memory changes:  no Hallucinations:  no  visual distortions: yes N/V:  yes (occasionally, related to eated) Lightheaded:  no (rarely)  Syncope: no Diplopia:  no Dyskinesia:  no (better after DBS)  PREVIOUS MEDICATIONS: Sinemet and Mirapex (patient is maintained on carbidopa/levodopa CR because of dizziness and nausea with immediate release)  ALLERGIES:   Allergies  Allergen Reactions  . Aspirin Other (See Comments)    Severe stomach pain   . Codeine Other (See Comments)    Makes her feel likes she going to pass out    CURRENT MEDICATIONS:     Medication List       These changes are accurate as of: 10/15/2012 11:59 PM. If you have any questions, ask your nurse or doctor.          TAKE these medications       ALPRAZolam 0.5 MG tablet  Commonly known as:  XANAX  Take 0.5 mg by mouth 2 (two) times daily.     amLODipine 10 MG tablet  Commonly known as:  NORVASC  Take 10 mg by mouth daily.     carbidopa-levodopa 25-100  MG per tablet  Commonly known as:  SINEMET CR  Take 1 tablet by mouth 4 (four) times daily.     CLONAZEPAM PO  Take 0.25 mg by mouth daily.     clopidogrel 75 MG tablet  Commonly known as:  PLAVIX  Take 1 tablet (75 mg total) by mouth daily.     Dextromethorphan-Quinidine 20-10 MG Caps  Take once a day for one week then take one tablet twice a day there after. Samples of this drug were given to the patient, quantity78, Lot Number [id number]  Exp 11/2013  Started by:  Octaviano Batty Tat, DO     DULoxetine 60 MG capsule  Commonly known as:  CYMBALTA  Take 60 mg by mouth daily. tid     esomeprazole 40 MG capsule  Commonly known as:  NEXIUM  Take 40 mg by mouth daily before breakfast.     losartan 100 MG tablet  Commonly known as:  COZAAR  Take 100 mg by  mouth daily.     oxybutynin 5 MG tablet  Commonly known as:  DITROPAN  Take 5 mg by mouth 3 (three) times daily.     oxyCODONE-acetaminophen 5-325 MG per tablet  Commonly known as:  PERCOCET/ROXICET  Take 1 tablet by mouth 4 (four) times daily.     pramipexole 0.5 MG tablet  Commonly known as:  MIRAPEX  Take 0.5 mg by mouth 3 (three) times daily.     Vitamin D (Ergocalciferol) 50000 UNITS Caps  Commonly known as:  DRISDOL  Take 50,000 Units by mouth daily.         PAST MEDICAL HISTORY:   Past Medical History  Diagnosis Date  . Parkinson's disease   . Reflux   . TIA (transient ischemic attack)     not really TIA but abnormal MRI per pt    PAST SURGICAL HISTORY:   Past Surgical History  Procedure Laterality Date  . Elbow surgery Left     after fx  . Wrist surgery Right     after fx  . Neck surgery    . Partial hysterectomy    . Back surgery    . Burr hole w/ stereotactic insertion of dbs leads / intraop microelectrode recording      2007  . Battery change      DBS, 12/2009  . Pr analyze neurostim brain, first 1h  10/15/2012         SOCIAL HISTORY:   History   Social History  . Marital Status: Married    Spouse Name: N/A    Number of Children: N/A  . Years of Education: N/A   Occupational History  . Not on file.   Social History Main Topics  . Smoking status: Former Smoker    Quit date: 10/15/1972  . Smokeless tobacco: Never Used  . Alcohol Use: Yes     Comment: 1/2 glass wine rarely  . Drug Use: Not on file  . Sexually Active: Not on file   Other Topics Concern  . Not on file   Social History Narrative  . No narrative on file    FAMILY HISTORY:   Family Status  Relation Status Death Age  . Mother Deceased     GBM  . Father Deceased     CAD  . Brother Alive     healthy  . Child Alive     healthy    ROS:  A complete 10 system review of systems was obtained and was  unremarkable apart from what is mentioned above.  PHYSICAL  EXAMINATION:    VITALS:   Filed Vitals:   10/15/12 1036  BP: 118/80  Pulse: 88  Temp: 97.9 F (36.6 C)  Resp: 20    GEN:  The patient appears stated age and is in NAD. HEENT:  Normocephalic, atraumatic.  The mucous membranes are moist. The superficial temporal arteries are without ropiness or tenderness. CV:  RRR Lungs:  CTAB Neck/HEME:  There are no carotid bruits bilaterally.  Neurological examination:  Orientation: The patient is alert and oriented x3. Fund of knowledge is appropriate.  Recent and remote memory are intact.  Attention and concentration are normal.    Able to name objects and repeat phrases.  There is pathologic, pseudobulbar affect consisting of spontaneous bouts of loud crying. Cranial nerves: There is left facial droop. Pupils are equal round and reactive to light bilaterally. Fundoscopic exam reveals clear margins bilaterally. Extraocular muscles are intact. The visual fields are full to confrontational testing. The speech is hypophonic and dysarthric. Soft palate rises symmetrically and there is no tongue deviation. Hearing is intact to conversational tone. Sensation: Sensation is intact to light and pinprick throughout (facial, trunk, extremities). Vibration is intact at the bilateral big toe. There is no extinction with double simultaneous stimulation. There is no sensory dermatomal level identified. Motor: Strength is 5/5 in the bilateral upper and lower extremities.   Shoulder shrug is equal and symmetric.  There is no pronator drift. Deep tendon reflexes: Deep tendon reflexes are 2/4 at the bilateral biceps, triceps, brachioradialis, patella and achilles. Plantar responses are downgoing bilaterally.  Movement examination: Tone: There is normal tone in the right upper extremity and just slight increase in the left upper extremity with activation.  Tone in the left lower extremity is increased.   Abnormal movements: None while the DBS is activated.  When turned  off, she has a moderate tremor in the right upper extremity and a mild tremor of the left upper extremity. Coordination:  There is definite decremation with RAM's, seen most prominently on the left with finger taps, hand opening and closing and heel taps. Gait and Station: The patient has significant difficulty arising out of a deep-seated chair without the use of the hands. The patient's stride length is markedly decreased and she reports inability to ambulate alone today because of pain in the back.  DBS programming was performed today which is described in more detail on a separate programming procedure note.  In brief, there is no significant changes following programming today.  ASSESSMENT/PLAN:  1.  Idiopathic Parkinson's disease.  -The patient is status post DBS placement in the bilateral STN at the end of 2007.  She had a battery replaced in August, 2011.  -She is currently adequately treated with the symptoms that DBS helps with.  She, however, does continue to have hypophonic and dysarthric speech.  She also continues to have gait instability.  These are not things that are helped by DBS.  It is my recommendation that she attend therapy, including speech/swallowing therapy but she does not wish to proceed.  She will let me know if she changes her mind.  I think that physical and occupational therapy would be of value.  -For now, she will remain on both her Mirapex and carbidopa/levodopa.  As above, she had trouble with the immediate release formulation and is now on controlled release formulation. 2.  pseudobulbar affect, with pathologic crying.  -I explained to the patient that this  is a part of her Parkinson's.  This is different and depression.  We talked about trying to start Nuedexta and she would very much like to try the medication.  We talked about risks, benefits, and side effects including the potential interaction with the Cymbalta and the risk of serotonin syndrome.  She  understands.  She was given samples.  She will take one tablet daily for a week and then increase to one tablet twice a day.  Risks, benefits, side effects and alternative therapies were discussed.  The opportunity to ask questions was given and they were answered to the best of my ability.  The patient expressed understanding and willingness to follow the outlined treatment protocols.  She will call me when the samples run out. 3.  I suspect that the patient does have quite some time (battery, but I will see her back in 2 months and we will recheck this.  In the meantime, I will try to get a copy of Dr. Donald Pore notes to see which settings and contacts they have used in the past. 4.  start time of the visit was 10:45 AM, end time out was 12:15 PM.

## 2012-10-15 NOTE — Patient Instructions (Addendum)
1.  Start nuedexta - 1 tablet daily for a week and then increase to one tablet twice per day.  Call me with questions/concerns/side effects and when you need a RX 2.  Call if you decide you would like to go to therapy 3.  Follow up in 2  months

## 2012-10-15 NOTE — Procedures (Signed)
DBS Programming was performed.    Total time spent programming was 40 minutes.  Device was confirmed to be on.  Soft start was confirmed to be on (8).  Impedences were checked and were within normal limits.  Battery was checked and was determined to be functioning normally and not near the end of life (2.60).  Final settings were as follows:  Left brain electrode:     5-C+           ; Amplitude  3.8   V   ; Pulse width 60 microseconds;   Frequency   180   Hz.  Right brain electrode:     1-C+          ; Amplitude   3.7  V ;  Pulse width 60  microseconds;  Frequency   180    Hz.

## 2012-11-01 ENCOUNTER — Telehealth: Payer: Self-pay

## 2012-11-01 NOTE — Telephone Encounter (Signed)
Pt scheduled to have CT scan this week and wants to know if her DBS needs to be turned off.  Per Dr. Arbutus Leas no she does not need to turn off, but take controller with her incase in gets cut off by machines.  Pt aware.

## 2012-12-08 ENCOUNTER — Ambulatory Visit: Payer: Self-pay | Admitting: Neurology

## 2012-12-15 ENCOUNTER — Ambulatory Visit (INDEPENDENT_AMBULATORY_CARE_PROVIDER_SITE_OTHER): Payer: Medicare Other | Admitting: Neurology

## 2012-12-15 ENCOUNTER — Encounter: Payer: Self-pay | Admitting: Neurology

## 2012-12-15 VITALS — BP 112/64 | HR 68 | Temp 97.9°F | Resp 16

## 2012-12-15 DIAGNOSIS — F329 Major depressive disorder, single episode, unspecified: Secondary | ICD-10-CM

## 2012-12-15 DIAGNOSIS — G20 Parkinson's disease: Secondary | ICD-10-CM

## 2012-12-15 DIAGNOSIS — F482 Pseudobulbar affect: Secondary | ICD-10-CM

## 2012-12-15 NOTE — Progress Notes (Signed)
[name] was seen today in the movement disorders clinic for neurologic consultation.   The consultation is for the evaluation of PD.  I reviewed past med records made available to me, which were primarily surgical notes from Dr. Vertell Limber.  The first symptom(s) the patient noticed was R hand tremor when she was about 67 y/o.  She went to a local neurologist (Dr. 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  Not long thereafter (maybe a year) she saw Dr. Erling Cruz for her neck and when she was checking out, she looked down at the checkout paper and saw that PD was written as a dx.  She ended up with several other neurologists, including a physician at [facility].  They concurred with the dx of PD.  She ultimately went back to Dr. Erling Cruz and she was started on levodopa in her early to mid 43's.  She was initially put on the IR but it caused nausea.  She was changed to the CR and she did better.  She states that mirapex was ultimately added, but she uses it for cramping and RLS.  She has tried to cut back on that with time.   The pt is s/p DBS in the b/l STN with initial battery 05/25/2006.  She had a battery change on 12/28/2009.  The patient finds DBS helpful.  If the battery is off, she has extreme tremor.  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  The biggest problem is really her speech.  She is hypophonic and dysarthric.  Her husband has difficulty understanding her.  She does not think that he will be able to take her out to do speech therapy.  She is willing to do in  home speech therapy.  She admits that her mood is "up and down."  She remains on Cymbalta.  She is not suicidal or homicidal.  She is not particularly active.  She does complain of intermittent tremor in the left hand, but that has been going on for about 18 months.  I did receive Dr. Donald Pore notes since last visit and noted that she has been complaining about  tenderness over the right extension cable since 2005.  Specific Symptoms:  Tremor: yes, rarely in the L hand (but mostly controlled with DBS) Voice: yes, softer and slower and slurred.  Last ST was about 6 months ago. Sleep: doing well right now  Vivid Dreams:  no  Acting out dreams:  yes (sleep talking, screaming).  Klonopin has helped Wet Pillows: no Postural symptoms:  yes  Falls?  yes (last fall 08/2012; in past has fx both arms with fall) Bradykinesia symptoms: slow movements, reduced facial expressions and difficulty getting out of a chair Loss of smell:  no Loss of taste:  no Urinary Incontinence:  yes, wears depends and on ditropan Difficulty Swallowing:  yes Handwriting, micrographia: yes Trouble with ADL's:  yes (has woman come help her 3 days per week; pt can do it but it takes long time)  Trouble buttoning clothing: yes Depression:  yes (primarily related to family/children stress).  Significant crying without always being sad.   Memory changes:  no Hallucinations:  no  visual distortions: yes N/V:  yes (occasionally, related to eated) Lightheaded:  no (rarely)  Syncope: no Diplopia:  no Dyskinesia:  no (better after DBS)  PREVIOUS MEDICATIONS: Sinemet and Mirapex (patient is maintained on carbidopa/levodopa CR because of dizziness and  nausea with immediate release)  ALLERGIES:   Allergies  Allergen Reactions  . Aspirin Other (See Comments)    Severe stomach pain   . Codeine Other (See Comments)    Makes her feel likes she going to pass out    CURRENT MEDICATIONS:     Medication List       This list is accurate as of: 12/15/12 11:17 AM.  Always use your most recent med list.               ALPRAZolam 0.5 MG tablet  Commonly known as:  XANAX  Take 0.5 mg by mouth 2 (two) times daily.     amLODipine 10 MG tablet  Commonly known as:  NORVASC  Take 10 mg by mouth daily.     carbidopa-levodopa 25-100 MG per tablet  Commonly known as:  SINEMET CR  Take  1 tablet by mouth 4 (four) times daily.     CLONAZEPAM PO  Take 0.25 mg by mouth daily.     clopidogrel 75 MG tablet  Commonly known as:  PLAVIX  Take 1 tablet (75 mg total) by mouth daily.     Dextromethorphan-Quinidine 20-10 MG Caps  Take once a day for one week then take one tablet twice a day there after. Samples of this drug were given to the patient, quantity78, Lot Number [id number]  Exp 11/2013     DULoxetine 60 MG capsule  Commonly known as:  CYMBALTA  Take 60 mg by mouth daily. tid     esomeprazole 40 MG capsule  Commonly known as:  NEXIUM  Take 40 mg by mouth daily before breakfast.     losartan 100 MG tablet  Commonly known as:  COZAAR  Take 100 mg by mouth daily.     oxybutynin 5 MG tablet  Commonly known as:  DITROPAN  Take 5 mg by mouth 3 (three) times daily.     oxyCODONE-acetaminophen 5-325 MG per tablet  Commonly known as:  PERCOCET/ROXICET  Take 1 tablet by mouth 4 (four) times daily.     pramipexole 0.5 MG tablet  Commonly known as:  MIRAPEX  Take 0.5 mg by mouth 3 (three) times daily.     Vitamin D (Ergocalciferol) 50000 UNITS Caps  Commonly known as:  DRISDOL  Take 50,000 Units by mouth daily.         PAST MEDICAL HISTORY:   Past Medical History  Diagnosis Date  . Parkinson's disease   . Reflux   . TIA (transient ischemic attack)     not really TIA but abnormal MRI per pt    PAST SURGICAL HISTORY:   Past Surgical History  Procedure Laterality Date  . Elbow surgery Left     after fx  . Wrist surgery Right     after fx  . Neck surgery    . Partial hysterectomy    . Back surgery    . Burr hole w/ stereotactic insertion of dbs leads / intraop microelectrode recording      2007  . Battery change      DBS, 12/2009  . Pr analyze neurostim brain, first 1h  10/15/2012         SOCIAL HISTORY:   History   Social History  . Marital Status: Married    Spouse Name: N/A    Number of Children: N/A  . Years of Education: N/A   Occupational  History  . Not on file.   Social History Main Topics  .  Smoking status: Former Smoker    Quit date: 10/15/1972  . Smokeless tobacco: Never Used  . Alcohol Use: Yes     Comment: 1/2 glass wine rarely  . Drug Use: Not on file  . Sexually Active: Not on file   Other Topics Concern  . Not on file   Social History Narrative  . No narrative on file    FAMILY HISTORY:   Family Status  Relation Status Death Age  . Mother Deceased     GBM  . Father Deceased     CAD  . Brother Alive     healthy  . Child Alive     healthy    ROS:  A complete 10 system review of systems was obtained and was unremarkable apart from what is mentioned above.  PHYSICAL EXAMINATION:    VITALS:   Filed Vitals:   12/15/12 1110  BP: 112/64  Pulse: 68  Temp: 97.9 F (36.6 C)  Resp: 16    GEN:  The patient appears stated age and is in NAD. HEENT:  Normocephalic, atraumatic.  The mucous membranes are moist. The superficial temporal arteries are without ropiness or tenderness. CV:  RRR Lungs:  CTAB Neck/HEME:  There are no carotid bruits bilaterally.  Neurological examination:  Orientation: The patient is alert and oriented x3. Fund of knowledge is appropriate.  Recent and remote memory are intact.  Attention and concentration are normal.    Able to name objects and repeat phrases.  There is pathologic, pseudobulbar affect consisting of spontaneous bouts of loud crying. Cranial nerves: There is left facial droop. Pupils are equal round and reactive to light bilaterally. Fundoscopic exam reveals clear margins bilaterally. Extraocular muscles are intact. The visual fields are full to confrontational testing. The speech is hypophonic and dysarthric. Soft palate rises symmetrically and there is no tongue deviation. Hearing is intact to conversational tone. Sensation: Sensation is intact to light and pinprick throughout (facial, trunk, extremities). Vibration is intact at the bilateral big toe. There is no  extinction with double simultaneous stimulation. There is no sensory dermatomal level identified. Motor: Strength is 5/5 in the bilateral upper and lower extremities.   Shoulder shrug is equal and symmetric.  There is no pronator drift. Deep tendon reflexes: Deep tendon reflexes are 2/4 at the bilateral biceps, triceps, brachioradialis, patella and achilles. Plantar responses are downgoing bilaterally.  Movement examination: Tone: There is normal tone in the right upper extremity and just slight increase in the left upper extremity with activation.  Tone in the left lower extremity is increased.   Abnormal movements: None while the DBS is activated.  When turned off, she has a mild left upper extremity tremor, but none was seen in the right today. Coordination:  There is definite decremation with RAM's, seen most prominently on the left with finger taps, hand opening and closing and heel taps. Gait and Station: The patient has significant difficulty arising out of a deep-seated chair without the use of the hands. The patient's stride length is markedly decreased and she reports inability to ambulate alone today because of pain in the back.  DBS programming was performed today which is described in more detail on a separate programming procedure note.  In brief, there is no significant changes following programming today.  ASSESSMENT/PLAN:  1.  Idiopathic Parkinson's disease.  -The patient is status post DBS placement in the bilateral STN at the end of 2007.  She had a battery replaced in August, 2011.  -She  is currently adequately treated with the symptoms that DBS helps with.  She, however, does continue to have hypophonic and dysarthric speech.  She also continues to have gait instability.  These are not things that are helped by DBS.  It is my recommendation that she attend therapy, including speech/swallowing therapy but she does not wish to proceed.  She is willing to have a speech therapist  come into her call me and we will arrange this.  -For now, she will remain on both her Mirapex and carbidopa/levodopa.  As above, she had trouble with the immediate release formulation and is now on controlled release formulation. 2.  pseudobulbar affect, with pathologic crying.  -Nuedexta was not of value, but she remains on Cymbalta for her depression.   Risks, benefits, side effects and alternative therapies were discussed.  The opportunity to ask questions was given and they were answered to the best of my ability.  The patient expressed understanding and willingness to follow the outlined treatment protocols.  She will call me when the samples run out. 3.  I suspect that the patient does have hopefully at least a year left on her battery, but I will see her back in 3 months and we will recheck this.

## 2012-12-15 NOTE — Procedures (Signed)
DBS Programming was performed.    Total time spent programming was 40 minutes.  Device was confirmed to be on.  Soft start was confirmed to be on (8).  Impedences were checked and were within normal limits.  Battery was checked and was determined to be functioning normally and not near the end of life (2.60).  Final settings were as follows:  Left brain electrode:     5-C+           ; Amplitude  3.9   V   ; Pulse width 60 microseconds;   Frequency   180   Hz.  Right brain electrode:     1-C+          ; Amplitude   3.7  V ;  Pulse width 60  microseconds;  Frequency   180    Hz.

## 2012-12-30 ENCOUNTER — Emergency Department (HOSPITAL_COMMUNITY)
Admission: EM | Admit: 2012-12-30 | Discharge: 2012-12-30 | Disposition: A | Discharge status: Home / Self Care | Payer: Medicare Other | Attending: Emergency Medicine | Admitting: Emergency Medicine

## 2012-12-30 ENCOUNTER — Emergency Department (HOSPITAL_COMMUNITY): Payer: Medicare Other

## 2012-12-30 ENCOUNTER — Encounter (HOSPITAL_COMMUNITY): Payer: Self-pay | Admitting: *Deleted

## 2012-12-30 DIAGNOSIS — G20A1 Parkinson's disease without dyskinesia, without mention of fluctuations: Secondary | ICD-10-CM | POA: Insufficient documentation

## 2012-12-30 DIAGNOSIS — Z79899 Other long term (current) drug therapy: Secondary | ICD-10-CM | POA: Insufficient documentation

## 2012-12-30 DIAGNOSIS — G20 Parkinson's disease: Secondary | ICD-10-CM | POA: Insufficient documentation

## 2012-12-30 DIAGNOSIS — F419 Anxiety disorder, unspecified: Secondary | ICD-10-CM | POA: Diagnosis present

## 2012-12-30 DIAGNOSIS — R4789 Other speech disturbances: Secondary | ICD-10-CM | POA: Insufficient documentation

## 2012-12-30 DIAGNOSIS — F411 Generalized anxiety disorder: Secondary | ICD-10-CM | POA: Insufficient documentation

## 2012-12-30 DIAGNOSIS — Z8673 Personal history of transient ischemic attack (TIA), and cerebral infarction without residual deficits: Secondary | ICD-10-CM | POA: Insufficient documentation

## 2012-12-30 DIAGNOSIS — Z87891 Personal history of nicotine dependence: Secondary | ICD-10-CM | POA: Insufficient documentation

## 2012-12-30 DIAGNOSIS — K219 Gastro-esophageal reflux disease without esophagitis: Secondary | ICD-10-CM | POA: Insufficient documentation

## 2012-12-30 DIAGNOSIS — R079 Chest pain, unspecified: Secondary | ICD-10-CM | POA: Insufficient documentation

## 2012-12-30 DIAGNOSIS — R5381 Other malaise: Secondary | ICD-10-CM | POA: Insufficient documentation

## 2012-12-30 LAB — GLUCOSE, CAPILLARY: Glucose-Capillary: 120 mg/dL — ABNORMAL HIGH (ref 70–99)

## 2012-12-30 NOTE — ED Notes (Signed)
Pt has hx of Parkinson's Disease - states was in a motorized wheelchair when battery stopped working.  Pt had to crawl up ramp to front porch to get help by her husband and aid back into chair.  Pt denies pain from this.  Pt anxious, very tearful.  EMS called out for possible SI.  Upon their arrival, pt and husband were involved in verbal altercation and pt made the statement, "I'm going to kill myself."   Pt admits to making statement stating, her "husband told me he doesn't want me in his bed anymore."  EMS states pt's husband gave her two choices in scene, to go with EMS or he was taking IVC papers.  Pt alert and oriented, very upset at this time.

## 2012-12-30 NOTE — ED Notes (Signed)
Left in c/o family for transport home; instructions and f/u information given/reviewed - verbalizes understanding.
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
## 2013-01-03 ENCOUNTER — Emergency Department (HOSPITAL_COMMUNITY)
Admission: EM | Admit: 2013-01-03 | Discharge: 2013-01-05 | Disposition: A | Discharge status: Other Acute Inpatient Hospital | Payer: Medicare Other | Attending: Emergency Medicine | Admitting: Emergency Medicine

## 2013-01-03 ENCOUNTER — Encounter (HOSPITAL_COMMUNITY): Payer: Self-pay

## 2013-01-03 DIAGNOSIS — R45851 Suicidal ideations: Secondary | ICD-10-CM

## 2013-01-03 DIAGNOSIS — F329 Major depressive disorder, single episode, unspecified: Secondary | ICD-10-CM

## 2013-01-03 DIAGNOSIS — K219 Gastro-esophageal reflux disease without esophagitis: Secondary | ICD-10-CM | POA: Insufficient documentation

## 2013-01-03 DIAGNOSIS — F32A Depression, unspecified: Secondary | ICD-10-CM

## 2013-01-03 DIAGNOSIS — G20 Parkinson's disease: Secondary | ICD-10-CM | POA: Insufficient documentation

## 2013-01-03 DIAGNOSIS — F3289 Other specified depressive episodes: Secondary | ICD-10-CM | POA: Insufficient documentation

## 2013-01-03 DIAGNOSIS — G20A1 Parkinson's disease without dyskinesia, without mention of fluctuations: Secondary | ICD-10-CM | POA: Insufficient documentation

## 2013-01-03 DIAGNOSIS — Z79899 Other long term (current) drug therapy: Secondary | ICD-10-CM | POA: Insufficient documentation

## 2013-01-03 DIAGNOSIS — Z87891 Personal history of nicotine dependence: Secondary | ICD-10-CM | POA: Insufficient documentation

## 2013-01-03 DIAGNOSIS — Z8673 Personal history of transient ischemic attack (TIA), and cerebral infarction without residual deficits: Secondary | ICD-10-CM | POA: Insufficient documentation

## 2013-01-03 HISTORY — DX: Major depressive disorder, single episode, unspecified: F32.9

## 2013-01-03 HISTORY — DX: Depression, unspecified: F32.A

## 2013-01-03 LAB — URINALYSIS, ROUTINE W REFLEX MICROSCOPIC
Bilirubin Urine: NEGATIVE
Glucose, UA: NEGATIVE mg/dL
Hgb urine dipstick: NEGATIVE
Ketones, ur: NEGATIVE mg/dL
Leukocytes, UA: NEGATIVE
Nitrite: NEGATIVE
Protein, ur: NEGATIVE mg/dL
Specific Gravity, Urine: 1.025 (ref 1.005–1.030)
Urobilinogen, UA: 0.2 mg/dL (ref 0.0–1.0)
pH: 6 (ref 5.0–8.0)

## 2013-01-03 LAB — CBC WITH DIFFERENTIAL/PLATELET
Basophils Absolute: 0 10*3/uL (ref 0.0–0.1)
Basophils Relative: 0 % (ref 0–1)
Eosinophils Absolute: 0.2 10*3/uL (ref 0.0–0.7)
Eosinophils Relative: 2 % (ref 0–5)
HCT: 44.5 % (ref 36.0–46.0)
Hemoglobin: 15.4 g/dL — ABNORMAL HIGH (ref 12.0–15.0)
Lymphocytes Relative: 25 % (ref 12–46)
Lymphs Abs: 2.4 10*3/uL (ref 0.7–4.0)
MCH: 31.9 pg (ref 26.0–34.0)
MCHC: 34.6 g/dL (ref 30.0–36.0)
MCV: 92.1 fL (ref 78.0–100.0)
Monocytes Absolute: 0.7 10*3/uL (ref 0.1–1.0)
Monocytes Relative: 7 % (ref 3–12)
Neutro Abs: 6.3 10*3/uL (ref 1.7–7.7)
Neutrophils Relative %: 66 % (ref 43–77)
Platelets: 258 10*3/uL (ref 150–400)
RBC: 4.83 MIL/uL (ref 3.87–5.11)
RDW: 12.6 % (ref 11.5–15.5)
WBC: 9.5 10*3/uL (ref 4.0–10.5)

## 2013-01-03 LAB — RAPID URINE DRUG SCREEN, HOSP PERFORMED
Amphetamines: NOT DETECTED
Barbiturates: NOT DETECTED
Benzodiazepines: POSITIVE — AB
Cocaine: NOT DETECTED
Opiates: NOT DETECTED
Tetrahydrocannabinol: NOT DETECTED

## 2013-01-03 LAB — BASIC METABOLIC PANEL
BUN: 14 mg/dL (ref 6–23)
CO2: 26 mEq/L (ref 19–32)
Calcium: 9.8 mg/dL (ref 8.4–10.5)
Chloride: 102 mEq/L (ref 96–112)
Creatinine, Ser: 0.65 mg/dL (ref 0.50–1.10)
GFR calc Af Amer: >90 mL/min (ref >90)
GFR calc non Af Amer: >90 mL/min (ref >90)
Glucose, Bld: 124 mg/dL — ABNORMAL HIGH (ref 70–99)
Potassium: 3.3 mEq/L — ABNORMAL LOW (ref 3.5–5.1)
Sodium: 139 mEq/L (ref 135–145)

## 2013-01-03 LAB — ETHANOL: Alcohol, Ethyl (B): <11 mg/dL (ref 0–11)

## 2013-01-03 LAB — ACETAMINOPHEN LEVEL: Acetaminophen (Tylenol), Serum: <15 ug/mL (ref 10–30)

## 2013-01-03 MED ORDER — CARBIDOPA-LEVODOPA CR 25-100 MG PO TBCR
1.0000 | EXTENDED_RELEASE_TABLET | Freq: Four times a day (QID) | ORAL | Status: DC
  Administered 2013-01-04: 1 via ORAL
  Filled 2013-01-03 (×11): qty 1

## 2013-01-03 MED ORDER — PANTOPRAZOLE SODIUM 40 MG PO TBEC
40.0000 mg | DELAYED_RELEASE_TABLET | Freq: Every day | ORAL | Status: DC
  Administered 2013-01-04 – 2013-01-05 (×2): 40 mg via ORAL
  Filled 2013-01-03 (×2): qty 1

## 2013-01-03 MED ORDER — OXYCODONE-ACETAMINOPHEN 5-325 MG PO TABS
1.0000 | ORAL_TABLET | Freq: Two times a day (BID) | ORAL | Status: DC | PRN
  Administered 2013-01-04: 1 via ORAL
  Filled 2013-01-03: qty 1

## 2013-01-03 MED ORDER — LOSARTAN POTASSIUM 50 MG PO TABS
100.0000 mg | ORAL_TABLET | Freq: Every day | ORAL | Status: DC
  Administered 2013-01-04 – 2013-01-05 (×2): 100 mg via ORAL
  Filled 2013-01-03 (×3): qty 2

## 2013-01-03 MED ORDER — LORAZEPAM 1 MG PO TABS: 1.0000 mg | ORAL_TABLET | Freq: Four times a day (QID) | ORAL | Status: DC | PRN

## 2013-01-03 MED ORDER — ALPRAZOLAM 0.5 MG PO TABS: 0.5000 mg | ORAL_TABLET | Freq: Every day | ORAL | Status: DC | PRN

## 2013-01-03 MED ORDER — PRAMIPEXOLE DIHYDROCHLORIDE 0.25 MG PO TABS
0.5000 mg | ORAL_TABLET | Freq: Three times a day (TID) | ORAL | Status: DC
  Administered 2013-01-04 – 2013-01-05 (×4): 0.5 mg via ORAL
  Filled 2013-01-03 (×8): qty 2

## 2013-01-03 MED ORDER — CLOPIDOGREL BISULFATE 75 MG PO TABS
75.0000 mg | ORAL_TABLET | Freq: Every day | ORAL | Status: DC
  Administered 2013-01-04 – 2013-01-05 (×2): 75 mg via ORAL
  Filled 2013-01-03 (×2): qty 1

## 2013-01-03 MED ORDER — OXYBUTYNIN CHLORIDE 5 MG PO TABS
5.0000 mg | ORAL_TABLET | Freq: Three times a day (TID) | ORAL | Status: DC
  Administered 2013-01-04 – 2013-01-05 (×4): 5 mg via ORAL
  Filled 2013-01-03 (×8): qty 1

## 2013-01-03 MED ORDER — AMLODIPINE BESYLATE 5 MG PO TABS
10.0000 mg | ORAL_TABLET | Freq: Every day | ORAL | Status: DC
  Administered 2013-01-04 – 2013-01-05 (×2): 10 mg via ORAL
  Filled 2013-01-03 (×2): qty 2
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
## 2013-01-03 NOTE — ED Notes (Signed)
Patient wanded by Engineer, materials R. Joneen Boers. Patient placed in blue paper scrubs and reds socks. Also patient has yellow fall risk bracelet placed at this time. Patient is wheelchair bound at home not able to stand.
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
## 2013-01-03 NOTE — ED Notes (Signed)
Telepsych in progress. 

## 2013-01-04 ENCOUNTER — Telehealth (HOSPITAL_COMMUNITY): Payer: Self-pay | Admitting: Licensed Clinical Social Worker

## 2013-01-04 ENCOUNTER — Emergency Department (HOSPITAL_COMMUNITY): Payer: Medicare Other

## 2013-01-04 DIAGNOSIS — F39 Unspecified mood [affective] disorder: Secondary | ICD-10-CM

## 2013-01-04 MED ORDER — ONDANSETRON 4 MG PO TBDP
4.0000 mg | ORAL_TABLET | Freq: Three times a day (TID) | ORAL | Status: DC | PRN
  Administered 2013-01-04 (×2): 4 mg via ORAL
  Filled 2013-01-04 (×2): qty 1

## 2013-01-04 MED ORDER — CARBIDOPA-LEVODOPA CR 25-100 MG PO TBCR
1.0000 | EXTENDED_RELEASE_TABLET | Freq: Two times a day (BID) | ORAL | Status: DC
  Administered 2013-01-04 – 2013-01-05 (×2): 1 via ORAL
  Filled 2013-01-04 (×4): qty 1

## 2013-01-04 MED ORDER — TRAZODONE HCL 50 MG PO TABS
50.0000 mg | ORAL_TABLET | Freq: Every day | ORAL | Status: DC
Start: 2013-01-05 — End: 2013-01-05
  Filled 2013-01-04: qty 1

## 2013-01-04 MED ORDER — MECLIZINE HCL 12.5 MG PO TABS
25.0000 mg | ORAL_TABLET | Freq: Four times a day (QID) | ORAL | Status: DC | PRN
  Administered 2013-01-04: 25 mg via ORAL
  Filled 2013-01-04: qty 2

## 2013-01-04 NOTE — ED Notes (Signed)
Notified pharmacy to send SR Sinemet.

## 2013-01-04 NOTE — ED Provider Notes (Signed)
Care management is looking into whether the patient can be transferred to [facility]. [place] is requesting ECG and chest x-rays and these are ordered.  ECG shows normal sinus rhythm with a rate of 81, no ectopy. Normal axis. Normal P wave. Normal QRS. Normal intervals. Normal ST and T waves. Impression: normal ECG. Compared with ECG of 12/30/2012, no significant changes are seen. Results for orders placed during the hospital encounter of 01/03/13  CBC WITH DIFFERENTIAL      Result Value Range   WBC 9.5  4.0 - 10.5 K/uL   RBC 4.83  3.87 - 5.11 MIL/uL   Hemoglobin 15.4 (*) 12.0 - 15.0 g/dL   HCT 16.1  09.6 - 04.5 %   MCV 92.1  78.0 - 100.0 fL   MCH 31.9  26.0 - 34.0 pg   MCHC 34.6  30.0 - 36.0 g/dL   RDW 40.9  81.1 - 91.4 %   Platelets 258  150 - 400 K/uL   Neutrophils Relative % 66  43 - 77 %   Neutro Abs 6.3  1.7 - 7.7 K/uL   Lymphocytes Relative 25  12 - 46 %   Lymphs Abs 2.4  0.7 - 4.0 K/uL   Monocytes Relative 7  3 - 12 %   Monocytes Absolute 0.7  0.1 - 1.0 K/uL   Eosinophils Relative 2  0 - 5 %   Eosinophils Absolute 0.2  0.0 - 0.7 K/uL   Basophils Relative 0  0 - 1 %   Basophils Absolute 0.0  0.0 - 0.1 K/uL  BASIC METABOLIC PANEL      Result Value Range   Sodium 139  135 - 145 mEq/L   Potassium 3.3 (*) 3.5 - 5.1 mEq/L   Chloride 102  96 - 112 mEq/L   CO2 26  19 - 32 mEq/L   Glucose, Bld 124 (*) 70 - 99 mg/dL   BUN 14  6 - 23 mg/dL   Creatinine, Ser 7.82  0.50 - 1.10 mg/dL   Calcium 9.8  8.4 - 95.6 mg/dL   GFR calc non Af Amer >90  >90 mL/min   GFR calc Af Amer >90  >90 mL/min  URINALYSIS, ROUTINE W REFLEX MICROSCOPIC      Result Value Range   Color, Urine YELLOW  YELLOW   APPearance CLEAR  CLEAR   Specific Gravity, Urine 1.025  1.005 - 1.030   pH 6.0  5.0 - 8.0   Glucose, UA NEGATIVE  NEGATIVE mg/dL   Hgb urine dipstick NEGATIVE  NEGATIVE   Bilirubin Urine NEGATIVE  NEGATIVE   Ketones, ur NEGATIVE  NEGATIVE mg/dL   Protein, ur NEGATIVE  NEGATIVE mg/dL   Urobilinogen, UA 0.2  0.0 - 1.0 mg/dL   Nitrite NEGATIVE  NEGATIVE   Leukocytes, UA NEGATIVE  NEGATIVE  URINE RAPID DRUG SCREEN (HOSP PERFORMED)      Result Value Range   Opiates NONE DETECTED  NONE DETECTED   Cocaine NONE DETECTED  NONE DETECTED   Benzodiazepines POSITIVE (*) NONE DETECTED   Amphetamines NONE DETECTED  NONE DETECTED   Tetrahydrocannabinol NONE DETECTED  NONE DETECTED   Barbiturates NONE DETECTED  NONE DETECTED  ETHANOL      Result Value Range   Alcohol, Ethyl (B) <11  0 - 11 mg/dL  ACETAMINOPHEN LEVEL      Result Value Range   Acetaminophen (Tylenol), Serum <15.0  10 - 30 ug/mL    Dg Chest Port 1 View  01/04/2013   *RADIOLOGY  REPORT*  Clinical Data: Medical clearance.  Back pain.  PORTABLE CHEST - 1 VIEW  Comparison: Chest x-ray 06/24/2010.  Findings: Electronic device projects over the lower right hemithorax limiting the examination.  Leads extend toward the head, presumably a neurostimulator.  The visualized portions of the thorax demonstrate no consolidative airspace disease, and no pleural effusions. Lung volumes are low.  No evidence of pulmonary edema.  Heart size is normal.  Orthopedic fixation hardware in the lower cervical spine.  IMPRESSION: 1.  Low lung volumes without radiographic evidence of acute cardiopulmonary disease.   Original Report Authenticated By: Trudie Reed, M.D.      Dione Booze, MD 01/04/13 [phone]
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
## 2013-01-04 NOTE — ED Notes (Signed)
Patient lying on bed with eyes closed; equal rise and fall of chest noted.  Patient cries out occasionally.  Sitter remains with patient.

## 2013-01-04 NOTE — ED Notes (Signed)
Pt c/o increased dizziness, worse with position changes.  edp notified.

## 2013-01-04 NOTE — ED Notes (Signed)
Patient ate about 25% of her evening meal - states she still feels a little nausea - no vomiting.  Advised will bring additional med for nausea with evening meds.

## 2013-01-04 NOTE — ED Notes (Signed)
Requesting pain pill for back. See mar

## 2013-01-04 NOTE — ED Notes (Signed)
Pt unable to stand along for Chest xray. Portable ordered and Fredric Mare with radiology aware.

## 2013-01-04 NOTE — ED Notes (Signed)
Pt assisted to and from bedside commode.  Pt had moderate loose BM.  Husband at bedside at this time.  Pt remaining calm.  nad noted.   Sitter at bedside.

## 2013-01-04 NOTE — ED Notes (Signed)
Son phoned and spoke w/patient.
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
## 2013-01-04 NOTE — BH Assessment (Signed)
Pt referred to the following hospitals:  [facility]  [facility]  Pending review at all the above facilities.

## 2013-01-04 NOTE — ED Notes (Signed)
Pt actively vomiting.  meds ordered.

## 2013-01-05 ENCOUNTER — Telehealth (HOSPITAL_COMMUNITY): Payer: Self-pay | Admitting: Licensed Clinical Social Worker

## 2013-01-05 NOTE — ED Notes (Signed)
Pt tolerated lunch meal well. Family members at bedside.

## 2013-01-05 NOTE — ED Notes (Signed)
Report called to dana at [facility], stated that we can not send the pt until she is served w/ comitment papers by Patent examiner.  Magistrate notified.  rn spoke to lawrence and he will "get papers ready", but is unsure of when the pt will be served.
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
## 2013-01-05 NOTE — ED Notes (Signed)
Per patient's spouse she is unable to sit for long periods of time and must be transported via ems

## 2013-01-05 NOTE — ED Provider Notes (Signed)
[name] Psych team has evaluated pt. Pt accepted to [facility], Dr. Perfecto Kingdom. They request IVC paperwork be completed before transfer. This completed. Pt remains cooperative and calm at this time and is aware of her pending transfer.   Laray Anger, DO 01/05/13 1000

## 2013-01-05 NOTE — ED Notes (Signed)
Pt given lunch meal tray, sitter remains at bedside.

## 2013-01-05 NOTE — ED Notes (Signed)
Patient awake. Expressing concerns over recent change from Cymbalta to Effexor. Patient reports Effexor is giving her diarrhea. RN advised patient that she is being transferred to [facility] later today (after 1300) and that the best option at this point is to consult with the psychiatrist at [facility] later today about her concerns, so that the best continuity of care can be provided. Patient agreeable.

## 2013-01-05 NOTE — BH Assessment (Addendum)
Per Courtney Tucker, patient accepted to [facility]. Patient accepted by Dr. Perfecto Kingdom. Nursing report is [phone]. Patient may be transported after 1 pm today.  Courtney Tucker from [place] is requesting that patient is made IVC due to risk of patient wanting to leave when she arrives. Says that patient's who come voluntarily have the right to refuse treatment and in order to avoid this situation Courtney Tucker is requesting IVC. Once completed IVC papers will need to be faxed to [facility] [phone]. Kathleen's direct # to confirm receipt of IVC paperwork is [phone]. Writer has contacted patient's nurse @ [facility] to ask that their EDP completes this task for IVC and to also make aware of patient's acceptance to [facility], accepting physician, nursing report #, and transport time.

## 2013-01-05 NOTE — ED Notes (Signed)
Per ACT, patient accepted at [facility] after 1 pm today. Accepted by Dr Perfecto Kingdom. [facility] requesting that patient be under involuntary commitment paperwork due to "issues" they have had with voluntary patients refusing to sign paperwork upon arrival to center. MD made aware.

## 2013-01-05 NOTE — ED Notes (Signed)
Pt served w/ commitment papers by officer petty from [employer].

## 2013-01-05 NOTE — ED Notes (Signed)
Patient resting with eyes closed. No distress.

## 2013-01-05 NOTE — ED Notes (Signed)
Snack given and patient prepared to go to sleep.  Sitter remains in room observing patient.  Patient calm and cooperative.

## 2013-01-05 NOTE — ED Notes (Signed)
Resting with eyes closed .  No distress

## 2013-01-05 NOTE — ED Provider Notes (Addendum)
Transfer delayed. Per [facility]: "bed is not ready." Pt will remain in the ED for now.   Laray Anger, DO 01/05/13 1533   T/C from [place]: bed ready. Will transfer pt, stable.   Laray Anger, DO 01/05/13 [phone]

## 2013-01-26 ENCOUNTER — Telehealth: Payer: Self-pay | Admitting: Neurology

## 2013-01-26 DIAGNOSIS — R51 Headache: Secondary | ICD-10-CM

## 2013-01-26 NOTE — Telephone Encounter (Signed)
Pt's husband calling pt has been crying for the past 3-4 days.  They changed her meds at behavioral health, but he doesn't know which.  They called her pcp and he suggested having her DBS checked to make sure it is functioning properly.  Mr. Trina Ao would also like her to have a scan of her head done because her mother passed away of a brain tumor at age 67 and pt has told him she is crying so much because of HA.  Please advise.

## 2013-01-27 NOTE — Telephone Encounter (Signed)
Ct scan scheduled for Mon, sept 8 at 1:00 at [facility] CT, pt's voice mail is full, will continue to call with appt info.

## 2013-01-27 NOTE — Telephone Encounter (Signed)
Go ahead and order CT brain without contrast.  Dx:  Headache.  As for the DBS, has she checked to make sure that it is on and functioning with her pt device?  Is she following with psychiatry?  Please let me know.

## 2013-01-28 ENCOUNTER — Telehealth: Payer: Self-pay | Admitting: Neurology

## 2013-01-28 NOTE — Telephone Encounter (Signed)
Pt aware of appt.

## 2013-01-31 ENCOUNTER — Ambulatory Visit (INDEPENDENT_AMBULATORY_CARE_PROVIDER_SITE_OTHER)
Admission: RE | Admit: 2013-01-31 | Discharge: 2013-01-31 | Disposition: A | Discharge status: Home / Self Care | Payer: Medicare Other | Source: Ambulatory Visit | Attending: Neurology | Admitting: Neurology

## 2013-01-31 DIAGNOSIS — R51 Headache: Secondary | ICD-10-CM

## 2013-02-01 ENCOUNTER — Telehealth: Payer: Self-pay | Admitting: Neurology

## 2013-02-01 NOTE — Telephone Encounter (Signed)
Spoke with Leonette Most. Informed that his wife's head CT was ok. He will pass on the information to her. No further questions or concerns voiced at this time.

## 2013-02-01 NOTE — Telephone Encounter (Signed)
Message copied by Benay Spice on Tue Feb 01, 2013  9:50 AM ------      Message from: TAT, REBECCA S      Created: Mon Jan 31, 2013  2:44 PM       Please let pt know that her CT did not show anything new ------

## 2013-02-02 NOTE — Telephone Encounter (Signed)
Lm for [facility] ok to restart therapy and to call if anything else needed.

## 2013-03-09 ENCOUNTER — Telehealth: Payer: Self-pay | Admitting: Neurology

## 2013-03-09 NOTE — Telephone Encounter (Signed)
I have contacted Courtney Tucker and am awaiting a response with what to do next / Courtney Tucker
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
## 2013-03-09 NOTE — Telephone Encounter (Signed)
[name], I think that perhaps we need to ask APS to get involved but do NOT give pt history as we don't want to violate HIPPA.  Please get [name]'s opinion as well

## 2013-03-31 ENCOUNTER — Ambulatory Visit: Payer: Medicare Other | Admitting: Neurology

## 2013-04-05 ENCOUNTER — Ambulatory Visit: Payer: Medicare Other | Admitting: Neurology

## 2013-04-11 ENCOUNTER — Encounter: Payer: Self-pay | Admitting: Neurology

## 2013-04-11 ENCOUNTER — Ambulatory Visit (INDEPENDENT_AMBULATORY_CARE_PROVIDER_SITE_OTHER): Payer: Medicare Other | Admitting: Neurology

## 2013-04-11 ENCOUNTER — Other Ambulatory Visit: Payer: Self-pay

## 2013-04-11 VITALS — BP 138/60 | HR 64 | Temp 97.9°F | Resp 12

## 2013-04-11 DIAGNOSIS — G20A1 Parkinson's disease without dyskinesia, without mention of fluctuations: Secondary | ICD-10-CM

## 2013-04-11 DIAGNOSIS — G20 Parkinson's disease: Secondary | ICD-10-CM
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
## 2013-04-11 NOTE — Procedures (Signed)
DBS Programming was performed.    Total time spent programming was 40 minutes.  Device was confirmed to be on.  Soft start was confirmed to be on.  Impedences were checked and were within normal limits.  Battery was checked and was determined to be functioning normally and just beginning to show some wear.  Final settings were as follows:  Left brain electrode:     5-C+   ; Amplitude  4.0   V   ; Pulse width 60 microseconds;   Frequency  180    Hz.  Right brain electrode:     1C+          ; Amplitude   3.7  V ;  Pulse width 60  microseconds;  Frequency   180    Hz.

## 2013-04-18 ENCOUNTER — Telehealth: Payer: Self-pay

## 2013-04-18 NOTE — Telephone Encounter (Signed)
ST said she will call me back and let us know if pt agrees to barium swallow eval. Then I will put order in.

## 2013-04-18 NOTE — Telephone Encounter (Signed)
That sounds fine to me
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
## 2013-05-04 ENCOUNTER — Ambulatory Visit (INDEPENDENT_AMBULATORY_CARE_PROVIDER_SITE_OTHER): Payer: Medicare Other | Admitting: Neurology

## 2013-05-04 ENCOUNTER — Encounter: Payer: Self-pay | Admitting: Neurology

## 2013-05-04 VITALS — BP 136/62 | HR 64 | Temp 98.1°F | Resp 12 | Ht 62.0 in | Wt 172.8 lb

## 2013-05-04 DIAGNOSIS — F339 Major depressive disorder, recurrent, unspecified: Secondary | ICD-10-CM

## 2013-05-04 DIAGNOSIS — G20 Parkinson's disease: Secondary | ICD-10-CM

## 2013-05-04 NOTE — Procedures (Signed)
DBS Programming was performed.    Total time spent programming was 30 minutes.  Device was confirmed to be on.  Soft start was confirmed to be on.  Impedences were checked and were within normal limits.  Battery was checked and was determined to be functioning normally and not near the end of life.  Final settings were as follows:  Left brain electrode:     5-C+           ; Amplitude  4.0   V   ; Pulse width 60 microseconds;   Frequency  175    Hz.  Right brain electrode:     1-C+          ; Amplitude   3.7  V ;  Pulse width 60  microseconds;  Frequency   175    Hz.

## 2013-05-04 NOTE — Patient Instructions (Signed)
1.  Per our discussions  -Parkinsons disease does not cause auditory hallucinations nor does the medication used to treat it  -I will have Maralyn Sago look at some SNF rehab options to see if there is anything available  -I would not change your parkinsons medications  -Call [facility] today
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
## 2013-05-06 ENCOUNTER — Telehealth: Payer: Self-pay

## 2013-05-06 NOTE — Telephone Encounter (Signed)
Called [facility] in [place] and spoke with the psychiatric nurse about pt's recent office visit and what we are doing to help the pt. I asked her to let the pt's doctor know. She said she will talk with the doctor and call me back with any suggestions on how to help the pt.

## 2013-05-17 ENCOUNTER — Telehealth: Payer: Self-pay

## 2013-05-17 NOTE — Telephone Encounter (Signed)
Called [facility] per Dr.Tat's request and left a voicemail asking that I get a call back to schedule an appointment for the pt to be seen.

## 2013-06-09 ENCOUNTER — Ambulatory Visit: Payer: Self-pay | Admitting: Neurology

## 2013-07-17 ENCOUNTER — Emergency Department (HOSPITAL_COMMUNITY): Payer: Medicare Other

## 2013-07-17 ENCOUNTER — Encounter (HOSPITAL_COMMUNITY): Payer: Self-pay | Admitting: Emergency Medicine

## 2013-07-17 ENCOUNTER — Emergency Department (HOSPITAL_COMMUNITY)
Admission: EM | Admit: 2013-07-17 | Discharge: 2013-07-17 | Disposition: A | Discharge status: Home / Self Care | Payer: Medicare Other | Attending: Emergency Medicine | Admitting: Emergency Medicine

## 2013-07-17 DIAGNOSIS — Z8673 Personal history of transient ischemic attack (TIA), and cerebral infarction without residual deficits: Secondary | ICD-10-CM | POA: Insufficient documentation

## 2013-07-17 DIAGNOSIS — R5383 Other fatigue: Secondary | ICD-10-CM

## 2013-07-17 DIAGNOSIS — K219 Gastro-esophageal reflux disease without esophagitis: Secondary | ICD-10-CM | POA: Insufficient documentation

## 2013-07-17 DIAGNOSIS — Z79899 Other long term (current) drug therapy: Secondary | ICD-10-CM | POA: Insufficient documentation

## 2013-07-17 DIAGNOSIS — R5381 Other malaise: Secondary | ICD-10-CM | POA: Insufficient documentation

## 2013-07-17 DIAGNOSIS — G8929 Other chronic pain: Secondary | ICD-10-CM | POA: Insufficient documentation

## 2013-07-17 DIAGNOSIS — F411 Generalized anxiety disorder: Secondary | ICD-10-CM | POA: Insufficient documentation

## 2013-07-17 DIAGNOSIS — F32A Depression, unspecified: Secondary | ICD-10-CM

## 2013-07-17 DIAGNOSIS — Z87891 Personal history of nicotine dependence: Secondary | ICD-10-CM | POA: Insufficient documentation

## 2013-07-17 DIAGNOSIS — M545 Low back pain, unspecified: Secondary | ICD-10-CM | POA: Insufficient documentation

## 2013-07-17 DIAGNOSIS — R45851 Suicidal ideations: Secondary | ICD-10-CM | POA: Insufficient documentation

## 2013-07-17 DIAGNOSIS — F3289 Other specified depressive episodes: Secondary | ICD-10-CM | POA: Insufficient documentation

## 2013-07-17 DIAGNOSIS — R4789 Other speech disturbances: Secondary | ICD-10-CM | POA: Insufficient documentation

## 2013-07-17 DIAGNOSIS — F329 Major depressive disorder, single episode, unspecified: Secondary | ICD-10-CM | POA: Insufficient documentation

## 2013-07-17 DIAGNOSIS — G20A1 Parkinson's disease without dyskinesia, without mention of fluctuations: Secondary | ICD-10-CM | POA: Insufficient documentation

## 2013-07-17 DIAGNOSIS — G20 Parkinson's disease: Secondary | ICD-10-CM | POA: Insufficient documentation

## 2013-07-17 DIAGNOSIS — Z7902 Long term (current) use of antithrombotics/antiplatelets: Secondary | ICD-10-CM | POA: Insufficient documentation

## 2013-07-17 HISTORY — DX: Anxiety disorder, unspecified: F41.9

## 2013-07-17 LAB — CBC
HCT: 44 % (ref 36.0–46.0)
Hemoglobin: 15.4 g/dL — ABNORMAL HIGH (ref 12.0–15.0)
MCH: 31 pg (ref 26.0–34.0)
MCHC: 35 g/dL (ref 30.0–36.0)
MCV: 88.5 fL (ref 78.0–100.0)
Platelets: 191 10*3/uL (ref 150–400)
RBC: 4.97 MIL/uL (ref 3.87–5.11)
RDW: 13.2 % (ref 11.5–15.5)
WBC: 7.4 10*3/uL (ref 4.0–10.5)

## 2013-07-17 LAB — COMPREHENSIVE METABOLIC PANEL
ALT: 7 U/L (ref 0–35)
AST: 16 U/L (ref 0–37)
Albumin: 3.7 g/dL (ref 3.5–5.2)
Alkaline Phosphatase: 125 U/L — ABNORMAL HIGH (ref 39–117)
BUN: 9 mg/dL (ref 6–23)
CO2: 30 mEq/L (ref 19–32)
Calcium: 9.7 mg/dL (ref 8.4–10.5)
Chloride: 98 mEq/L (ref 96–112)
Creatinine, Ser: 0.79 mg/dL (ref 0.50–1.10)
GFR calc Af Amer: >90 mL/min (ref >90)
GFR calc non Af Amer: 84 mL/min — ABNORMAL LOW (ref >90)
Glucose, Bld: 96 mg/dL (ref 70–99)
Potassium: 3.6 mEq/L — ABNORMAL LOW (ref 3.7–5.3)
Sodium: 142 mEq/L (ref 137–147)
Total Bilirubin: 0.4 mg/dL (ref 0.3–1.2)
Total Protein: 7.5 g/dL (ref 6.0–8.3)

## 2013-07-17 LAB — SALICYLATE LEVEL: Salicylate Lvl: <2 mg/dL — ABNORMAL LOW (ref 2.8–20.0)

## 2013-07-17 LAB — TROPONIN I: Troponin I: <0.3 ng/mL (ref <0.30)

## 2013-07-17 LAB — ETHANOL: Alcohol, Ethyl (B): <11 mg/dL (ref 0–11)

## 2013-07-17 LAB — ACETAMINOPHEN LEVEL: Acetaminophen (Tylenol), Serum: <15 ug/mL (ref 10–30)

## 2013-07-17 MED ORDER — PRAMIPEXOLE DIHYDROCHLORIDE 0.25 MG PO TABS
0.5000 mg | ORAL_TABLET | Freq: Three times a day (TID) | ORAL | Status: DC
  Filled 2013-07-17: qty 2

## 2013-07-17 MED ORDER — DULOXETINE HCL 60 MG PO CPEP: 60.0000 mg | ORAL_CAPSULE | Freq: Every day | ORAL | Status: DC

## 2013-07-17 MED ORDER — CLOPIDOGREL BISULFATE 75 MG PO TABS
75.0000 mg | ORAL_TABLET | Freq: Every day | ORAL | Status: DC
  Filled 2013-07-17: qty 1

## 2013-07-17 MED ORDER — OXYBUTYNIN CHLORIDE 5 MG PO TABS: 5.0000 mg | ORAL_TABLET | Freq: Three times a day (TID) | ORAL | Status: DC

## 2013-07-17 MED ORDER — CLONAZEPAM 0.5 MG PO TABS: 0.5000 mg | ORAL_TABLET | Freq: Every day | ORAL | Status: DC

## 2013-07-17 MED ORDER — CARBIDOPA-LEVODOPA ER 25-100 MG PO TBCR
1.0000 | EXTENDED_RELEASE_TABLET | Freq: Two times a day (BID) | ORAL | Status: DC
  Filled 2013-07-17: qty 1

## 2013-07-17 MED ORDER — AMLODIPINE BESYLATE 10 MG PO TABS: 10.0000 mg | ORAL_TABLET | Freq: Every day | ORAL | Status: DC

## 2013-07-17 MED ORDER — PANTOPRAZOLE SODIUM 40 MG PO TBEC
40.0000 mg | DELAYED_RELEASE_TABLET | Freq: Every day | ORAL | Status: DC
Start: 2013-07-17 — End: 2013-07-18
  Filled 2013-07-17: qty 1

## 2013-07-17 MED ORDER — LOSARTAN POTASSIUM 50 MG PO TABS: 100.0000 mg | ORAL_TABLET | Freq: Every day | ORAL | Status: DC

## 2013-07-17 NOTE — ED Notes (Signed)
Patient transported to X-ray 

## 2013-07-17 NOTE — Discharge Instructions (Signed)
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
## 2013-07-21 ENCOUNTER — Ambulatory Visit: Payer: Self-pay | Admitting: Neurology

## 2013-07-22 ENCOUNTER — Ambulatory Visit: Payer: Self-pay | Admitting: Neurology

## 2013-07-25 ENCOUNTER — Ambulatory Visit: Payer: Self-pay | Admitting: Neurology

## 2013-07-25 ENCOUNTER — Ambulatory Visit (INDEPENDENT_AMBULATORY_CARE_PROVIDER_SITE_OTHER): Payer: Medicare Other | Admitting: Neurology

## 2013-07-25 ENCOUNTER — Encounter: Payer: Self-pay | Admitting: Neurology

## 2013-07-25 VITALS — BP 134/76 | HR 84 | Resp 16 | Ht 62.0 in | Wt 156.0 lb

## 2013-07-25 DIAGNOSIS — R279 Unspecified lack of coordination: Secondary | ICD-10-CM

## 2013-07-25 DIAGNOSIS — F329 Major depressive disorder, single episode, unspecified: Secondary | ICD-10-CM

## 2013-07-25 DIAGNOSIS — G249 Dystonia, unspecified: Secondary | ICD-10-CM

## 2013-07-25 DIAGNOSIS — G20 Parkinson's disease: Secondary | ICD-10-CM

## 2013-07-25 DIAGNOSIS — G20A1 Parkinson's disease without dyskinesia, without mention of fluctuations: Secondary | ICD-10-CM

## 2013-07-25 DIAGNOSIS — F339 Major depressive disorder, recurrent, unspecified: Secondary | ICD-10-CM | POA: Insufficient documentation

## 2013-07-25 NOTE — Procedures (Signed)
DBS Programming was performed.    Total time spent programming was 40 minutes.  Device was confirmed to be on.  Soft start was confirmed to be on.  Impedences were checked and were within normal limits.  Battery was checked and was determined to be  near the end of life.  Final settings were as follows:  Left brain electrode:     5-C+           ; Amplitude  3.8   V   ; Pulse width 60 microseconds;   Frequency   175   Hz.  Right brain electrode:     1-C+          ; Amplitude   3.5  V ;  Pulse width 60  microseconds;  Frequency   175    Hz.
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
## 2013-07-25 NOTE — Patient Instructions (Signed)
1. You have surgery scheduled with Dr Vertell Limber on 08/05/13 to change your DBS battery. STOP PLAVIX TODAY. Call Janett Billow with Dr Melven Sartorius office at [phone] for additional instructions. 2. We will see you back post operatively on 08/19/13 at 2:00 pm.

## 2013-07-26 ENCOUNTER — Other Ambulatory Visit: Payer: Self-pay | Admitting: Neurosurgery

## 2013-07-28 ENCOUNTER — Encounter (HOSPITAL_COMMUNITY): Payer: Self-pay

## 2013-08-03 ENCOUNTER — Encounter (HOSPITAL_COMMUNITY)
Admission: RE | Admit: 2013-08-03 | Discharge: 2013-08-03 | Disposition: A | Discharge status: Home / Self Care | Payer: Medicare Other | Source: Ambulatory Visit | Attending: Neurosurgery | Admitting: Neurosurgery

## 2013-08-03 ENCOUNTER — Encounter (HOSPITAL_COMMUNITY): Payer: Self-pay

## 2013-08-03 DIAGNOSIS — Z01812 Encounter for preprocedural laboratory examination: Secondary | ICD-10-CM

## 2013-08-03 HISTORY — DX: Dysphasia: R47.02

## 2013-08-03 HISTORY — DX: Hyperlipidemia, unspecified: E78.5

## 2013-08-03 HISTORY — DX: Essential (primary) hypertension: I10

## 2013-08-03 LAB — CBC
HCT: 44.4 % (ref 36.0–46.0)
Hemoglobin: 16 g/dL — ABNORMAL HIGH (ref 12.0–15.0)
MCH: 31.4 pg (ref 26.0–34.0)
MCHC: 36 g/dL (ref 30.0–36.0)
MCV: 87.1 fL (ref 78.0–100.0)
Platelets: 217 10*3/uL (ref 150–400)
RBC: 5.1 MIL/uL (ref 3.87–5.11)
RDW: 13 % (ref 11.5–15.5)
WBC: 7 10*3/uL (ref 4.0–10.5)

## 2013-08-03 LAB — BASIC METABOLIC PANEL
BUN: 10 mg/dL (ref 6–23)
CO2: 27 mEq/L (ref 19–32)
Calcium: 9.6 mg/dL (ref 8.4–10.5)
Chloride: 99 mEq/L (ref 96–112)
Creatinine, Ser: 0.71 mg/dL (ref 0.50–1.10)
GFR calc Af Amer: >90 mL/min (ref >90)
GFR calc non Af Amer: 87 mL/min — ABNORMAL LOW (ref >90)
Glucose, Bld: 101 mg/dL — ABNORMAL HIGH (ref 70–99)
Potassium: 3.2 mEq/L — ABNORMAL LOW (ref 3.7–5.3)
Sodium: 140 mEq/L (ref 137–147)

## 2013-08-03 NOTE — Pre-Procedure Instructions (Signed)
Courtney Tucker  08/03/2013   Your procedure is scheduled on:  08-05-2013  Friday   Report to [facility]  2 * 3 at 7:15 AM.   Call this number if you have problems the morning of surgery: [phone]   Remember:   Do not eat food or drink liquids after midnight.    Take these medicines the morning of surgery with A SIP OF WATER:alprazolam as needed,amlodipine,Sinemet CR,cymbalta,nexium,   Ditropan,mirapex,eye drops as needed   Do not wear jewelry, make-up or nail polish.  Do not wear lotions, powders, or perfumes.   Do not shave 48 hours prior to surgery. .  Do not bring valuables to the hospital.  [facility] is not responsible  for any belongings or valuables.               Contacts, dentures or bridgework may not be worn into surgery.  Leave suitcase in the car. After surgery it may be brought to your room.  For patients admitted to the hospital, discharge time is determined by your treatment team.               Patients discharged the day of surgery will not be allowed to drive home.   Name and phone number of your driver:    Special Instructions:   Before surgery, you can play an important role.  Because skin is not sterile, your skin needs to be as free of germs as possible.  You can reduce the number of germs on you skin by washing with CHG (chlorahexidine gluconate) soap before surgery.  CHG is an antiseptic cleaner which kills germs and bonds with the skin to continue killing germs even after washing.  Please DO NOT use if you have an allergy to CHG or antibacterial soaps.  If your skin becomes reddened/irritated stop using the CHG and inform your nurse when you arrive at Short Stay.  Do not shave (including legs and underarms) for at least 48 hours prior to the first CHG shower.  You may shave your face.  Please follow these instructions carefully:   1.  Shower with CHG Soap the night before surgery and the   morning of Surgery.  2.  If  you choose to wash your hair, wash your hair first as usual with your normal shampoo.  3.  After you shampoo, rinse your hair and body thoroughly to remove the  Shampoo.  4.  Use CHG as you would any other liquid soap.  You can apply chg directly  to the skin and wash gently with scrungie or a clean washcloth.  5.  Apply the CHG Soap to your body ONLY FROM THE NECK DOWN.   Do not use on open wounds or open sores.  Avoid contact with your eyes,  ears, mouth and genitals (private parts).  Wash genitals (private parts) with your normal soap.  6.  Wash thoroughly, paying special attention to the area where your surgery will be performed.  7.  Thoroughly rinse your body with warm water from the neck down.  8.  DO NOT shower/wash with your normal soap after using and rinsing o  the CHG Soap.  9.  Pat yourself dry with a clean towel.            10.  Wear clean pajamas.            11.  Place clean sheets on your bed the night of your first shower  and do not sleep with pets.  Day of Surgery  Do not apply any lotions/deodorants the morning of surgery.  Please wear clean clothes to the hospital/surgery center.     Please read over the following fact sheets that you were given: Pain Booklet and Surgical Site Infection Prevention

## 2013-08-04 MED ORDER — CEFAZOLIN SODIUM-DEXTROSE 2-3 GM-% IV SOLR
2.0000 g | INTRAVENOUS | Status: AC
  Administered 2013-08-05: 2 g via INTRAVENOUS
  Filled 2013-08-04: qty 50

## 2013-08-05 ENCOUNTER — Encounter (HOSPITAL_COMMUNITY)
Admission: RE | Disposition: A | Discharge status: Home / Self Care | Payer: Self-pay | Source: Ambulatory Visit | Attending: Neurosurgery

## 2013-08-05 ENCOUNTER — Ambulatory Visit (HOSPITAL_COMMUNITY)
Admission: RE | Admit: 2013-08-05 | Discharge: 2013-08-05 | Disposition: A | Discharge status: Home / Self Care | Payer: Medicare Other | Source: Ambulatory Visit | Attending: Neurosurgery | Admitting: Neurosurgery

## 2013-08-05 ENCOUNTER — Encounter (HOSPITAL_COMMUNITY): Payer: Medicare Other | Admitting: Certified Registered"

## 2013-08-05 ENCOUNTER — Encounter (HOSPITAL_COMMUNITY): Payer: Self-pay | Admitting: *Deleted

## 2013-08-05 ENCOUNTER — Inpatient Hospital Stay (HOSPITAL_COMMUNITY): Payer: Medicare Other | Admitting: Certified Registered"

## 2013-08-05 DIAGNOSIS — I2510 Atherosclerotic heart disease of native coronary artery without angina pectoris: Secondary | ICD-10-CM | POA: Insufficient documentation

## 2013-08-05 DIAGNOSIS — F329 Major depressive disorder, single episode, unspecified: Secondary | ICD-10-CM | POA: Insufficient documentation

## 2013-08-05 DIAGNOSIS — R269 Unspecified abnormalities of gait and mobility: Secondary | ICD-10-CM | POA: Insufficient documentation

## 2013-08-05 DIAGNOSIS — F3289 Other specified depressive episodes: Secondary | ICD-10-CM | POA: Insufficient documentation

## 2013-08-05 DIAGNOSIS — I10 Essential (primary) hypertension: Secondary | ICD-10-CM | POA: Insufficient documentation

## 2013-08-05 DIAGNOSIS — I252 Old myocardial infarction: Secondary | ICD-10-CM | POA: Insufficient documentation

## 2013-08-05 DIAGNOSIS — Z462 Encounter for fitting and adjustment of other devices related to nervous system and special senses: Secondary | ICD-10-CM | POA: Insufficient documentation

## 2013-08-05 DIAGNOSIS — K219 Gastro-esophageal reflux disease without esophagitis: Secondary | ICD-10-CM | POA: Insufficient documentation

## 2013-08-05 DIAGNOSIS — G20 Parkinson's disease: Secondary | ICD-10-CM | POA: Insufficient documentation

## 2013-08-05 DIAGNOSIS — G20A1 Parkinson's disease without dyskinesia, without mention of fluctuations: Secondary | ICD-10-CM | POA: Insufficient documentation

## 2013-08-05 DIAGNOSIS — Z01812 Encounter for preprocedural laboratory examination: Secondary | ICD-10-CM | POA: Insufficient documentation

## 2013-08-05 DIAGNOSIS — I509 Heart failure, unspecified: Secondary | ICD-10-CM | POA: Insufficient documentation

## 2013-08-05 HISTORY — PX: SUBTHALAMIC STIMULATOR BATTERY REPLACEMENT: SHX5405

## 2013-08-05 SURGERY — SUBTHALAMIC STIMULATOR BATTERY REPLACEMENT
Anesthesia: Monitor Anesthesia Care

## 2013-08-05 MED ORDER — PROPOFOL INFUSION 10 MG/ML OPTIME
INTRAVENOUS | Status: DC | PRN
  Administered 2013-08-05: 100 ug/kg/min via INTRAVENOUS

## 2013-08-05 MED ORDER — FENTANYL CITRATE 0.05 MG/ML IJ SOLN: 25.0000 ug | INTRAMUSCULAR | Status: DC | PRN

## 2013-08-05 MED ORDER — LIDOCAINE-EPINEPHRINE 1 %-1:100000 IJ SOLN
INTRAMUSCULAR | Status: DC | PRN
  Administered 2013-08-05: 5 mL via INTRADERMAL

## 2013-08-05 MED ORDER — SODIUM CHLORIDE 0.9 % IJ SOLN
INTRAMUSCULAR | Status: AC
  Filled 2013-08-05: qty 10

## 2013-08-05 MED ORDER — EPHEDRINE SULFATE 50 MG/ML IJ SOLN
INTRAMUSCULAR | Status: AC
  Filled 2013-08-05: qty 1

## 2013-08-05 MED ORDER — PROPOFOL 10 MG/ML IV BOLUS
INTRAVENOUS | Status: AC
  Filled 2013-08-05: qty 20

## 2013-08-05 MED ORDER — MIDAZOLAM HCL 5 MG/5ML IJ SOLN
INTRAMUSCULAR | Status: DC | PRN
Start: 2013-08-05 — End: 2013-08-05
  Administered 2013-08-05: 2 mg via INTRAVENOUS

## 2013-08-05 MED ORDER — VANCOMYCIN HCL 1000 MG IV SOLR
INTRAVENOUS | Status: DC | PRN
  Administered 2013-08-05: 1000 mg

## 2013-08-05 MED ORDER — VANCOMYCIN HCL 1000 MG IV SOLR
INTRAVENOUS | Status: AC
  Filled 2013-08-05: qty 1000

## 2013-08-05 MED ORDER — ROCURONIUM BROMIDE 50 MG/5ML IV SOLN
INTRAVENOUS | Status: AC
  Filled 2013-08-05: qty 1

## 2013-08-05 MED ORDER — BUPIVACAINE HCL (PF) 0.5 % IJ SOLN
INTRAMUSCULAR | Status: DC | PRN
  Administered 2013-08-05: 5 mL

## 2013-08-05 MED ORDER — BACITRACIN ZINC 500 UNIT/GM EX OINT
TOPICAL_OINTMENT | CUTANEOUS | Status: DC | PRN
  Administered 2013-08-05: 1 via TOPICAL

## 2013-08-05 MED ORDER — FENTANYL CITRATE 0.05 MG/ML IJ SOLN
INTRAMUSCULAR | Status: DC | PRN
  Administered 2013-08-05: 50 ug via INTRAVENOUS
  Administered 2013-08-05: 25 ug via INTRAVENOUS

## 2013-08-05 MED ORDER — FENTANYL CITRATE 0.05 MG/ML IJ SOLN
INTRAMUSCULAR | Status: AC
  Filled 2013-08-05: qty 5

## 2013-08-05 MED ORDER — MIDAZOLAM HCL 2 MG/2ML IJ SOLN
INTRAMUSCULAR | Status: AC
  Filled 2013-08-05: qty 2

## 2013-08-05 MED ORDER — LIDOCAINE HCL (CARDIAC) 20 MG/ML IV SOLN
INTRAVENOUS | Status: AC
  Filled 2013-08-05: qty 5

## 2013-08-05 MED ORDER — ONDANSETRON HCL 4 MG/2ML IJ SOLN: 4.0000 mg | Freq: Once | INTRAMUSCULAR | Status: DC | PRN

## 2013-08-05 MED ORDER — LACTATED RINGERS IV SOLN
INTRAVENOUS | Status: DC
  Administered 2013-08-05: 50 mL/h via INTRAVENOUS

## 2013-08-05 SURGICAL SUPPLY — 56 items
BAG DECANTER FOR FLEXI CONT (MISCELLANEOUS) ×2 IMPLANT
BLADE SURG 10 STRL SS (BLADE) ×2 IMPLANT
BLADE SURG 15 STRL LF DISP TIS (BLADE) ×1 IMPLANT
BLADE SURG 15 STRL SS (BLADE) ×1
BOOT SUTURE AID YELLOW STND (SUTURE) ×2 IMPLANT
CANISTER SUCT 3000ML (MISCELLANEOUS) ×2 IMPLANT
CORDS BIPOLAR (ELECTRODE) ×2 IMPLANT
DERMABOND ADHESIVE PROPEN (GAUZE/BANDAGES/DRESSINGS) ×1
DERMABOND ADVANCED .7 DNX6 (GAUZE/BANDAGES/DRESSINGS) ×1 IMPLANT
DRAPE INCISE IOBAN 66X45 STRL (DRAPES) ×2 IMPLANT
DRAPE LAPAROTOMY 100X72 PEDS (DRAPES) ×2 IMPLANT
DRAPE POUCH INSTRU U-SHP 10X18 (DRAPES) ×2 IMPLANT
DRESSING TELFA 8X3 (GAUZE/BANDAGES/DRESSINGS) ×2 IMPLANT
DRSG OPSITE 4X5.5 SM (GAUZE/BANDAGES/DRESSINGS) IMPLANT
DRSG OPSITE POSTOP 4X6 (GAUZE/BANDAGES/DRESSINGS) ×2 IMPLANT
ELECT CAUTERY BLADE 6.4 (BLADE) ×2 IMPLANT
GAUZE SPONGE 4X4 16PLY XRAY LF (GAUZE/BANDAGES/DRESSINGS) ×2 IMPLANT
GLOVE BIO SURGEON STRL SZ8 (GLOVE) ×2 IMPLANT
GLOVE BIOGEL PI IND STRL 8 (GLOVE) ×1 IMPLANT
GLOVE BIOGEL PI IND STRL 8.5 (GLOVE) ×1 IMPLANT
GLOVE BIOGEL PI INDICATOR 8 (GLOVE) ×1
GLOVE BIOGEL PI INDICATOR 8.5 (GLOVE) ×1
GLOVE ECLIPSE 7.5 STRL STRAW (GLOVE) ×2 IMPLANT
GLOVE EXAM NITRILE LRG STRL (GLOVE) IMPLANT
GLOVE EXAM NITRILE MD LF STRL (GLOVE) IMPLANT
GLOVE EXAM NITRILE XL STR (GLOVE) IMPLANT
GLOVE EXAM NITRILE XS STR PU (GLOVE) IMPLANT
GOWN BRE IMP SLV AUR LG STRL (GOWN DISPOSABLE) IMPLANT
GOWN BRE IMP SLV AUR XL STRL (GOWN DISPOSABLE) ×2 IMPLANT
GOWN STRL REIN 2XL LVL4 (GOWN DISPOSABLE) ×2 IMPLANT
KIT BASIN OR (CUSTOM PROCEDURE TRAY) ×2 IMPLANT
KIT ROOM TURNOVER OR (KITS) ×2 IMPLANT
NEEDLE HYPO 25X1 1.5 SAFETY (NEEDLE) ×2 IMPLANT
NEUROSTIM OCTOPOLAR ~~LOC~~ 49X65 (Neuro Prosthesis/Implant) ×2 IMPLANT
NEUROSTIM PROGRAMMER 2.2X3.7 (Neuro Prosthesis/Implant) ×2 IMPLANT
NS IRRIG 1000ML POUR BTL (IV SOLUTION) ×2 IMPLANT
PACK SURGICAL SETUP 50X90 (CUSTOM PROCEDURE TRAY) ×2 IMPLANT
PAD ARMBOARD 7.5X6 YLW CONV (MISCELLANEOUS) ×6 IMPLANT
PATIENT PROGRAMMER MEDTRONIC ×2 IMPLANT
PENCIL BUTTON HOLSTER BLD 10FT (ELECTRODE) ×2 IMPLANT
POCKET ADAPTOR FOR DEEP BRAIN STIMULATOR ×2 IMPLANT
SPONGE GAUZE 4X4 12PLY (GAUZE/BANDAGES/DRESSINGS) ×2 IMPLANT
STAPLER SKIN PROX WIDE 3.9 (STAPLE) ×2 IMPLANT
STOCKINETTE 4X48 STRL (DRAPES) ×2 IMPLANT
STRIP CLOSURE SKIN 1/2X4 (GAUZE/BANDAGES/DRESSINGS) ×2 IMPLANT
SUT ETHILON 3 0 PS 1 (SUTURE) ×4 IMPLANT
SUT SILK 2 0 TIES 10X30 (SUTURE) ×2 IMPLANT
SUT VIC AB 2-0 CP2 18 (SUTURE) ×2 IMPLANT
SUT VIC AB 3-0 SH 8-18 (SUTURE) ×4 IMPLANT
SYR BULB 3OZ (MISCELLANEOUS) ×2 IMPLANT
SYR CONTROL 10ML LL (SYRINGE) ×2 IMPLANT
TOWEL OR 17X24 6PK STRL BLUE (TOWEL DISPOSABLE) ×2 IMPLANT
TOWEL OR 17X26 10 PK STRL BLUE (TOWEL DISPOSABLE) ×2 IMPLANT
TUBE CONNECTING 12X1/4 (SUCTIONS) ×2 IMPLANT
UNDERPAD 30X30 INCONTINENT (UNDERPADS AND DIAPERS) ×2 IMPLANT
WATER STERILE IRR 1000ML POUR (IV SOLUTION) ×2 IMPLANT

## 2013-08-05 NOTE — Transfer of Care (Signed)
Immediate Anesthesia Transfer of Care Note  Patient: Courtney Tucker  Procedure(s) Performed: Procedure(s): SUBTHALAMIC STIMULATOR BATTERY REPLACEMENT (N/A)  Patient Location: PACU  Anesthesia Type:General  Level of Consciousness: awake and alert   Airway & Oxygen Therapy: RA SAO2 98%  Post-op Assessment: Report given to PACU RN and Post -op Vital signs reviewed and stable  Post vital signs: Reviewed and stable  Complications: No apparent anesthesia complications

## 2013-08-05 NOTE — Preoperative (Signed)
Beta Blockers   Reason not to administer Beta Blockers:Not Applicable 

## 2013-08-05 NOTE — Anesthesia Preprocedure Evaluation (Addendum)
Anesthesia Evaluation  Patient identified by MRN, date of birth, ID band Patient awake    Reviewed: Allergy & Precautions, H&P , NPO status , Patient's Chart, lab work & pertinent test results  History of Anesthesia Complications Negative for: history of anesthetic complications  Airway Mallampati: II TM Distance: >3 FB Neck ROM: Full    Dental  (+) Teeth Intact,    Pulmonary neg sleep apnea, neg COPDneg recent URI, former smoker,  breath sounds clear to auscultation        Cardiovascular hypertension, Pt. on medications - angina- CAD, - Past MI and - CHF - dysrhythmias Rhythm:Regular Rate:Normal     Neuro/Psych Depression Parkinsons with bilateral deep brain stimulators, right battery expired, gait impairment TIA   GI/Hepatic Neg liver ROS, GERD-  Medicated and Controlled,  Endo/Other  negative endocrine ROS  Renal/GU negative Renal ROS     Musculoskeletal   Abdominal   Peds  Hematology negative hematology ROS (+)   Anesthesia Other Findings   Reproductive/Obstetrics                          Anesthesia Physical Anesthesia Plan  ASA: III  Anesthesia Plan: MAC   Post-op Pain Management:    Induction: Intravenous  Airway Management Planned: Natural Airway  Additional Equipment: None  Intra-op Plan:   Post-operative Plan:   Informed Consent: I have reviewed the patients History and Physical, chart, labs and discussed the procedure including the risks, benefits and alternatives for the proposed anesthesia with the patient or authorized representative who has indicated his/her understanding and acceptance.   Dental advisory given  Plan Discussed with: CRNA and Surgeon  Anesthesia Plan Comments:         Anesthesia Quick Evaluation

## 2013-08-05 NOTE — Brief Op Note (Signed)
08/05/2013  11:47 AM  PATIENT:  Courtney Tucker  67 y.o. 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  It was elected for patient to undergo IPG revision.  PROCEDURE: Patient was brought to the operating room and given intravenous sedation.  Right upper chest was prepped with betadine scrub and Duraprep.  Area of planned incision was infiltrated with lidocaine.  Prior incision was reopened and the old IPG was externalized.  Adaptor was connected to new IPG which was placed in the pocket.  Wound was irrigated with bacitracin and sprinkled with vancomycin. Then irrigated once more.  Incision was closed with 2-0 Vicryl and 3-0 vicryl sutures and dressed with a sterile occlusive dressing.  Counts were correct at the end of the case.  PLAN OF CARE: Discharge to home after PACU  PATIENT DISPOSITION:  PACU - hemodynamically stable.   Delay start of Pharmacological VTE agent (>24hrs) due to surgical blood loss or risk of bleeding: yes  

## 2013-08-05 NOTE — Op Note (Signed)
08/05/2013  11:47 AM  PATIENT:  Courtney Tucker  68 y.o. 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  It was elected for patient to undergo IPG revision.  PROCEDURE: Patient was brought to the operating room and given intravenous sedation.  Right upper chest was prepped with betadine scrub and Duraprep.  Area of planned incision was infiltrated with lidocaine.  Prior incision was reopened and the old IPG was externalized.  Adaptor was connected to new IPG which was placed in the pocket.  Wound was irrigated with bacitracin and sprinkled with vancomycin. Then irrigated once more.  Incision was closed with 2-0 Vicryl and 3-0 vicryl sutures and dressed with a sterile occlusive dressing.  Counts were correct at the end of the case.  PLAN OF CARE: Discharge to home after PACU  PATIENT DISPOSITION:  PACU - hemodynamically stable.   Delay start of Pharmacological VTE agent (>24hrs) due to surgical blood loss or risk of bleeding: yes

## 2013-08-05 NOTE — H&P (Signed)
  Patient has Deep Brain Stimulator for Parkinson's Disease and now has depleted Implantable Pulse Generator.  She comes in for battery change today.

## 2013-08-05 NOTE — Anesthesia Procedure Notes (Signed)
Procedure Name: MAC Date/Time: 08/05/2013 11:05 AM Performed by: Julian Reil Pre-anesthesia Checklist: Patient identified, Emergency Drugs available, Suction available and Patient being monitored Patient Re-evaluated:Patient Re-evaluated prior to inductionOxygen Delivery Method: Simple face mask Intubation Type: IV induction Placement Confirmation: positive ETCO2 and breath sounds checked- equal and bilateral

## 2013-08-05 NOTE — Interval H&P Note (Signed)
History and Physical Interval Note:  08/05/2013 7:24 AM  Courtney Tucker  has presented today for surgery, with the diagnosis of Parkinsons  The various methods of treatment have been discussed with the patient and family. After consideration of risks, benefits and other options for treatment, the patient has consented to  Procedure(s) with comments: SUBTHALAMIC STIMULATOR BATTERY REPLACEMENT (N/A) - Deep brain stimulator battery replacement as a surgical intervention .  The patient's history has been reviewed, patient examined, no change in status, stable for surgery.  I have reviewed the patient's chart and labs.  Questions were answered to the patient's satisfaction.     STERN,JOSEPH D

## 2013-08-05 NOTE — Anesthesia Postprocedure Evaluation (Signed)
  Anesthesia Post-op Note  Patient: Courtney Tucker  Procedure(s) Performed: Procedure(s): SUBTHALAMIC STIMULATOR BATTERY REPLACEMENT (N/A)  Patient Location: PACU  Anesthesia Type:MAC  Level of Consciousness: awake and alert   Airway and Oxygen Therapy: Patient Spontanous Breathing  Post-op Pain: none  Post-op Assessment: Post-op Vital signs reviewed, Patient's Cardiovascular Status Stable, Respiratory Function Stable, Patent Airway, No signs of Nausea or vomiting and Pain level controlled  Post-op Vital Signs: Reviewed and stable  Complications: No apparent anesthesia complications

## 2013-08-09 ENCOUNTER — Encounter (HOSPITAL_COMMUNITY): Payer: Self-pay | Admitting: Neurosurgery

## 2013-08-11 NOTE — Discharge Summary (Signed)
Physician Discharge Summary  Patient ID: Courtney Tucker MRN: [id number] DOB/AGE: August 08, 1945 68 y.o.  Admit date: 08/05/2013 Discharge date: 08/11/2013  Admission Diagnoses:Parkinson's Disease with Deep Brain Stimulator and depleted Implanted Pulse Generator (IPG)  Discharge Diagnoses: Same Active Problems:   Parkinson disease   Discharged Condition: good  Hospital Course: Revision of IPG  Consults: None  Significant Diagnostic Studies: None  Treatments: surgery: Revision of IPG  Discharge Exam: Blood pressure 136/59, pulse 67, temperature 98.8 F (37.1 C), temperature source Oral, resp. rate 20, SpO2 94.00%. Neurologic: Alert and oriented X 3, normal strength and tone. Normal symmetric reflexes. Normal coordination and gait Wound:CDI  Disposition: Home   Future Appointments Provider Department Dept Phone   08/19/2013 2:30 PM [facility] [phone]       Medication List    ASK your doctor about these medications       ALPRAZolam 0.5 MG tablet  Commonly known as:  XANAX  Take 0.5 mg by mouth 3 (three) times daily as needed for anxiety.     amLODipine 10 MG tablet  Commonly known as:  NORVASC  Take 10 mg by mouth daily.     clopidogrel 75 MG tablet  Commonly known as:  PLAVIX  Take 1 tablet (75 mg total) by mouth daily.     DULoxetine 60 MG capsule  Commonly known as:  CYMBALTA  Take 60 mg by mouth daily.     esomeprazole 40 MG capsule  Commonly known as:  NEXIUM  Take 40 mg by mouth daily before breakfast.     losartan 100 MG tablet  Commonly known as:  COZAAR  Take 100 mg by mouth daily.     oxybutynin 5 MG tablet  Commonly known as:  DITROPAN  Take 5 mg by mouth 3 (three) times daily.     pramipexole 0.5 MG tablet  Commonly known as:  MIRAPEX  Take 0.5 mg by mouth 2 (two) times daily.     SINEMET CR 25-100 MG tablet controlled release  Generic drug:  Carbidopa-Levodopa ER  Take 1 tablet by mouth 2 (two)  times daily.     tetrahydrozoline-zinc 0.05-0.25 % ophthalmic solution  Commonly known as:  VISINE-AC  Place 1 drop into both eyes 3 (three) times daily as needed (dry eyes).     traZODone 50 MG tablet  Commonly known as:  DESYREL  Take 50 mg by mouth at bedtime.     VITAMIN D PO  Take 3 tablets by mouth every morning.         Signed: Peggyann Shoals, MD 08/11/2013, 6:26 PM

## 2013-08-19 ENCOUNTER — Ambulatory Visit (INDEPENDENT_AMBULATORY_CARE_PROVIDER_SITE_OTHER): Payer: Medicare Other | Admitting: Neurology

## 2013-08-19 ENCOUNTER — Encounter: Payer: Self-pay | Admitting: Neurology

## 2013-08-19 VITALS — BP 124/70 | HR 64 | Resp 14 | Ht 62.0 in | Wt 155.5 lb

## 2013-08-19 DIAGNOSIS — F482 Pseudobulbar affect: Secondary | ICD-10-CM

## 2013-08-19 DIAGNOSIS — G20 Parkinson's disease: Secondary | ICD-10-CM

## 2013-08-19 DIAGNOSIS — F329 Major depressive disorder, single episode, unspecified: Secondary | ICD-10-CM

## 2013-08-19 NOTE — Procedures (Signed)
DBS Programming was performed.    Total time spent programming was 40 minutes.  Device was confirmed to be on.  Soft start was confirmed to be on.  Impedences were checked and were within normal limits.  Battery was checked and was determined to be functioning normally and not near the end of life. Pt shown how to use new pt programmer.  Deactived pts ability to adjust settings.   Final settings were as follows:  Left brain electrode:     1-C+           ; Amplitude  3.5   V   ; Pulse width 60 microseconds;   Frequency   170   Hz.  Right brain electrode:     5-C+          ; Amplitude   3.8  V ;  Pulse width 60  microseconds;  Frequency   180    Hz.

## 2013-08-19 NOTE — Patient Instructions (Signed)
1. Stop Mirapex, continue Levodopa.  2. Continue therapy. 3. Follow up 3 months.
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
## 2013-10-11 ENCOUNTER — Ambulatory Visit: Payer: Self-pay | Admitting: Gastroenterology

## 2013-11-09 ENCOUNTER — Encounter: Payer: Self-pay | Admitting: Gastroenterology

## 2013-11-09 ENCOUNTER — Ambulatory Visit (INDEPENDENT_AMBULATORY_CARE_PROVIDER_SITE_OTHER): Payer: Medicare Other | Admitting: Gastroenterology

## 2013-11-09 ENCOUNTER — Other Ambulatory Visit: Payer: Self-pay | Admitting: Gastroenterology

## 2013-11-09 ENCOUNTER — Encounter (INDEPENDENT_AMBULATORY_CARE_PROVIDER_SITE_OTHER): Payer: Self-pay

## 2013-11-09 VITALS — BP 120/73 | HR 91 | Temp 97.8°F | Resp 16 | Ht 62.0 in | Wt 150.0 lb

## 2013-11-09 DIAGNOSIS — R1310 Dysphagia, unspecified: Secondary | ICD-10-CM

## 2013-11-09 DIAGNOSIS — Z1211 Encounter for screening for malignant neoplasm of colon: Secondary | ICD-10-CM

## 2013-11-09 MED ORDER — PEG 3350-KCL-NA BICARB-NACL 420 G PO SOLR: 4000.0000 mL | ORAL | Status: DC

## 2013-11-09 NOTE — Patient Instructions (Signed)
We have scheduled you for a colonoscopy, upper endoscopy and dilation with Dr. Oneida Alar in the near future.  Further recommendations to follow!

## 2013-11-09 NOTE — Progress Notes (Signed)
Primary Care Physician:  Octavio Graves, DO Primary Gastroenterologist:  Dr. Oneida Alar   Chief Complaint  Patient presents with  . Referral    HPI:   Courtney Tucker presents today for a visit prior to initial screening colonoscopy. Occasional constipation. Started on [place] recently. Working well. Scant hematochezia. Intermittent abdominal pain, improved after defecation. Nexium controls reflux. Sometimes has to drink a lot with her food. Solid food dysphagia. Eats small amounts at a time but still has a good appetite. Rare nausea.   Past Medical History  Diagnosis Date  . Parkinson's disease     diagnosed at age 55  . Reflux   . TIA (transient ischemic attack)     not really TIA but abnormal MRI per pt  . Depression   . Anxiety   . Hypertension   . Hyperlipidemia   . Dysphasia     Past Surgical History  Procedure Laterality Date  . Elbow surgery Left     after fx  . Wrist surgery Right     after fx  . Neck surgery    . Partial hysterectomy    . Back surgery    . Burr hole w/ stereotactic insertion of dbs leads / intraop microelectrode recording      2007  . Battery change      DBS, 12/2009  . Pr analyze neurostim brain, first 1h  10/15/2012       . Subthalamic stimulator battery replacement N/A 08/05/2013    Procedure: SUBTHALAMIC STIMULATOR BATTERY REPLACEMENT;  Surgeon: Erline Levine, MD;  Location: [place] [facility];  Service: Neurosurgery;  Laterality: N/A;  . Foot surgery      Current Outpatient Prescriptions  Medication Sig Dispense Refill  . ALPRAZolam (XANAX) 0.5 MG tablet Take 0.5 mg by mouth 3 (three) times daily as needed for anxiety.      Marland Kitchen amLODipine (NORVASC) 10 MG tablet Take 10 mg by mouth daily.      . Carbidopa-Levodopa ER (SINEMET CR) 25-100 MG tablet controlled release Take 1 tablet by mouth 2 (two) times daily.       . Cholecalciferol (VITAMIN D PO) Take 3 tablets by mouth every morning.      . clopidogrel (PLAVIX) 75 MG tablet Take 1  tablet (75 mg total) by mouth daily.  90 tablet  1  . DULoxetine (CYMBALTA) 60 MG capsule Take 60 mg by mouth daily.      Marland Kitchen esomeprazole (NEXIUM) 40 MG capsule Take 40 mg by mouth daily before breakfast.      . HYDROcodone-acetaminophen (NORCO) 10-325 MG per tablet Take 1 tablet by mouth every 6 (six) hours as needed.      . Linaclotide (LINZESS) 145 MCG CAPS capsule Take 145 mcg by mouth daily.      Marland Kitchen losartan (COZAAR) 100 MG tablet Take 100 mg by mouth daily.      Marland Kitchen oxybutynin (DITROPAN) 5 MG tablet Take 5 mg by mouth 3 (three) times daily.      . pramipexole (MIRAPEX) 0.5 MG tablet Take 0.5 mg by mouth 2 (two) times daily.       . promethazine (PHENERGAN) 25 MG tablet Take 25 mg by mouth every 6 (six) hours as needed for nausea or vomiting.      Marland Kitchen tetrahydrozoline-zinc (VISINE-AC) 0.05-0.25 % ophthalmic solution Place 1 drop into both eyes 3 (three) times daily as needed (dry eyes).      . traZODone (DESYREL) 50 MG tablet Take 50  mg by mouth at bedtime.       No current facility-administered medications for this visit.    Allergies as of 11/09/2013 - Review Complete 11/09/2013  Allergen Reaction Noted  . Aspirin Other (See Comments) 04/01/2011  . Codeine Other (See Comments) 04/01/2011  . Oxycodone  07/25/2013    Family History  Problem Relation Age of Onset  . Colon cancer Paternal Uncle     History   Social History  . Marital Status: Married    Spouse Name: N/A    Number of Children: N/A  . Years of Education: N/A   Occupational History  . Not on file.   Social History Main Topics  . Smoking status: Former Smoker    Quit date: 10/15/1972  . Smokeless tobacco: Never Used     Comment: started back smoking close to a pack a day, doesn't smoke the entire cigarette  . Alcohol Use: Yes     Comment: 1/2 glass wine rarely  . Drug Use: No  . Sexual Activity: Not on file   Other Topics Concern  . Not on file   Social History Narrative  . No narrative on file     Review of Systems: Gen: see HPI CV: chest discomfort with anxiety Resp: +DOE GI: see HPI GU : frequent UTIs MS: +muscle weakness, +joint pain Derm: Denies rash, itching, dry skin Psych: +anxiety Heme: Denies bruising, bleeding, and enlarged lymph nodes.  Physical Exam: BP 120/73  Pulse 91  Temp(Src) 97.8 F (36.6 C) (Oral)  Resp 16  Ht 5\' 2"  (1.575 m)  Wt 150 lb (68.04 kg)  BMI 27.43 kg/m2 General:   Alert and oriented. Pleasant and cooperative. Appears weak, in wheelchair.  Head:  Normocephalic and atraumatic. Eyes:  Without icterus, sclera clear and conjunctiva pink.  Ears:  Normal auditory acuity. Nose:  No deformity, discharge,  or lesions. Mouth:  No deformity or lesions, dentures present.  Neck:  Supple, without mass or thyromegaly. Lungs:  Clear to auscultation bilaterally. No wheezes, rales, or rhonchi. No distress.  Heart:  S1, S2 present without murmurs appreciated.  Abdomen:  +BS, soft, non-tender and non-distended. No HSM noted. No guarding or rebound. No masses appreciated.  Rectal:  Deferred  Msk:  Symmetrical without gross deformities. Normal posture. Pulses:  Normal pulses noted. Extremities:  Shuffling gait, consistent with Parkinson's Neurologic:  Alert and  oriented x4 Skin:  Intact without significant lesions or rashes. Psych:  Alert and cooperative. Tearful at times

## 2013-11-14 ENCOUNTER — Encounter (HOSPITAL_COMMUNITY): Payer: Self-pay | Admitting: Pharmacy Technician

## 2013-11-14 DIAGNOSIS — Z462 Encounter for fitting and adjustment of other devices related to nervous system and special senses: Secondary | ICD-10-CM | POA: Insufficient documentation

## 2013-11-14 DIAGNOSIS — Z1211 Encounter for screening for malignant neoplasm of colon: Secondary | ICD-10-CM | POA: Insufficient documentation

## 2013-11-14 DIAGNOSIS — R1310 Dysphagia, unspecified: Secondary | ICD-10-CM | POA: Insufficient documentation

## 2013-11-14 NOTE — Assessment & Plan Note (Signed)
68 year old female with history of Parkinson's, multiple medications, presenting for initial screening colonoscopy. Doing well with Linzess for constipation; scant hematochezia likely benign in nature.   Proceed with colonoscopy with Dr. Oneida Alar in the near future. The risks, benefits, and alternatives have been discussed in detail with the patient. They state understanding and desire to proceed.  Propofol due to polypharmacy

## 2013-11-14 NOTE — Assessment & Plan Note (Signed)
Chronic, solid food dysphagia. May be neurological in nature but unable to exclude GI etiology.   Proceed with upper endoscopy and dilation in the near future with Dr. Oneida Alar . The risks, benefits, and alternatives have been discussed in detail with patient. They have stated understanding and desire to proceed.  Propofol due to polypharmacy

## 2013-11-15 NOTE — Progress Notes (Signed)
cc'd to pcp 

## 2013-11-22 ENCOUNTER — Encounter: Payer: Self-pay | Admitting: Neurology

## 2013-11-22 ENCOUNTER — Ambulatory Visit (INDEPENDENT_AMBULATORY_CARE_PROVIDER_SITE_OTHER): Payer: Medicare Other | Admitting: Neurology

## 2013-11-22 VITALS — BP 114/68 | HR 84 | Resp 14 | Ht 62.0 in | Wt 142.0 lb

## 2013-11-22 DIAGNOSIS — F419 Anxiety disorder, unspecified: Secondary | ICD-10-CM

## 2013-11-22 DIAGNOSIS — F482 Pseudobulbar affect: Secondary | ICD-10-CM

## 2013-11-22 DIAGNOSIS — G20 Parkinson's disease: Secondary | ICD-10-CM

## 2013-11-22 DIAGNOSIS — F411 Generalized anxiety disorder: Secondary | ICD-10-CM

## 2013-11-22 DIAGNOSIS — F331 Major depressive disorder, recurrent, moderate: Secondary | ICD-10-CM

## 2013-11-22 NOTE — Procedures (Signed)
DBS Programming was performed.    Total time spent programming was 40 minutes.  Device was confirmed to be on.  Soft start was confirmed to be on.  Impedences were checked and were within normal limits.  Battery was checked and was determined to be functioning normally and not near the end of life. Pt shown how to use new pt programmer.  Deactived pts ability to adjust settings.   Final settings were as follows:  Left brain electrode:     1-C+           ; Amplitude  3.5   V   ; Pulse width 60 microseconds;   Frequency   170   Hz.  Right brain electrode:     5-C+          ; Amplitude   4.0  V ;  Pulse width 60  microseconds;  Frequency   170    Hz.
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
## 2013-11-22 NOTE — Patient Instructions (Signed)
1. Decrease Carbidopa Levodopa to once daily in the morning for one week and then discontinue. Let us know if you have any problems. Let us know if you re-start medication.

## 2013-11-24 ENCOUNTER — Encounter (HOSPITAL_COMMUNITY): Payer: Self-pay

## 2013-11-24 ENCOUNTER — Encounter (HOSPITAL_COMMUNITY)
Admission: RE | Admit: 2013-11-24 | Discharge: 2013-11-24 | Disposition: A | Discharge status: Home / Self Care | Payer: Medicare Other | Source: Ambulatory Visit | Attending: Gastroenterology | Admitting: Gastroenterology

## 2013-11-24 DIAGNOSIS — Z01812 Encounter for preprocedural laboratory examination: Secondary | ICD-10-CM | POA: Insufficient documentation

## 2013-11-24 HISTORY — DX: Sleep apnea, unspecified: G47.30

## 2013-11-24 HISTORY — DX: Gastro-esophageal reflux disease without esophagitis: K21.9

## 2013-11-24 HISTORY — DX: Unspecified osteoarthritis, unspecified site: M19.90

## 2013-11-24 LAB — BASIC METABOLIC PANEL
Anion gap: 11 (ref 5–15)
BUN: 8 mg/dL (ref 6–23)
CO2: 31 mEq/L (ref 19–32)
Calcium: 9.8 mg/dL (ref 8.4–10.5)
Chloride: 102 mEq/L (ref 96–112)
Creatinine, Ser: 0.69 mg/dL (ref 0.50–1.10)
GFR calc Af Amer: >90 mL/min (ref >90)
GFR calc non Af Amer: 88 mL/min — ABNORMAL LOW (ref >90)
Glucose, Bld: 113 mg/dL — ABNORMAL HIGH (ref 70–99)
Potassium: 3.6 mEq/L — ABNORMAL LOW (ref 3.7–5.3)
Sodium: 144 mEq/L (ref 137–147)

## 2013-11-24 LAB — HEMOGLOBIN AND HEMATOCRIT, BLOOD
HCT: 42.9 % (ref 36.0–46.0)
Hemoglobin: 15.3 g/dL — ABNORMAL HIGH (ref 12.0–15.0)

## 2013-11-24 NOTE — Patient Instructions (Signed)
Courtney Tucker  11/24/2013   Your procedure is scheduled on:  11/29/2013  Report to [place]  270  AM.  Call this number if you have problems the morning of surgery: [phone]   Remember:   Do not eat food or drink liquids after midnight.   Take these medicines the morning of surgery with A SIP OF WATER: xanax, amlodipine, cymbalta, nexium, hydrocodone, cozaar, ditropan   Do not wear jewelry, make-up or nail polish.  Do not wear lotions, powders, or perfumes.   Do not shave 48 hours prior to surgery. Men may shave face and neck.  Do not bring valuables to the hospital.  [facility] is not responsible for any belongings or valuables.               Contacts, dentures or bridgework may not be worn into surgery.  Leave suitcase in the car. After surgery it may be brought to your room.  For patients admitted to the hospital, discharge time is determined by your treatment team.               Patients discharged the day of surgery will not be allowed to drive home.  Name and phone number of your driver: family  Special Instructions: N/A   Please read over the following fact sheets that you were given: Pain Booklet, Coughing and Deep Breathing, Surgical Site Infection Prevention, Anesthesia Post-op Instructions and Care and Recovery After Surgery Colonoscopy A colonoscopy is an exam to look at the entire large intestine (colon). This exam can help find problems such as tumors, polyps, inflammation, and areas of bleeding. The exam takes about 1 hour.  LET [facility] CARE PROVIDER KNOW ABOUT:   Any allergies you have.  All medicines you are taking, including vitamins, herbs, eye drops, creams, and over-the-counter medicines.  Previous problems you or members of your family have had with the use of anesthetics.  Any blood disorders you have.  Previous surgeries you have had.  Medical conditions you have. RISKS AND COMPLICATIONS  Generally, this is a safe  procedure. However, as with any procedure, complications can occur. Possible complications include:  Bleeding.  Tearing or rupture of the colon wall.  Reaction to medicines given during the exam.  Infection (rare). BEFORE THE PROCEDURE   Ask your health care provider about changing or stopping your regular medicines.  You may be prescribed an oral bowel prep. This involves drinking a large amount of medicated liquid, starting the day before your procedure. The liquid will cause you to have multiple loose stools until your stool is almost clear or light green. This cleans out your colon in preparation for the procedure.  Do not eat or drink anything else once you have started the bowel prep, unless your health care provider tells you it is safe to do so.  Arrange for someone to drive you home after the procedure. PROCEDURE   You will be given medicine to help you relax (sedative).  You will lie on your side with your knees bent.  A long, flexible tube with a light and camera on the end (colonoscope) will be inserted through the rectum and into the colon. The camera sends video back to a computer screen as it moves through the colon. The colonoscope also releases carbon dioxide gas to inflate the colon. This helps your health care provider see the area better.  During the exam, your health care  provider may take a small tissue sample (biopsy) to be examined under a microscope if any abnormalities are found.  The exam is finished when the entire colon has been viewed. AFTER THE PROCEDURE   Do not drive for 24 hours after the exam.  You may have a small amount of blood in your stool.  You may pass moderate amounts of gas and have mild abdominal cramping or bloating. This is caused by the gas used to inflate your colon during the exam.  Ask when your test results will be ready and how you will get your results. Make sure you get your test results. Document Released: 05/09/2000  Document Revised: 03/02/2013 Document Reviewed: 01/17/2013 [facility] Patient Information 2015 [place], [place]. This information is not intended to replace advice given to you by your health care provider. Make sure you discuss any questions you have with your health care provider. Esophageal Dilatation The esophagus is the long, narrow tube which carries food and liquid from the mouth to the stomach. Esophageal dilatation is the technique used to stretch a blocked or narrowed portion of the esophagus. This procedure is used when a part of the esophagus has become so narrow that it becomes difficult, painful or even impossible to swallow. This is generally an uncomplicated form of treatment. When this is not successful, chest surgery may be required. This is a much more extensive form of treatment with a longer recovery time. CAUSES  Some of the more common causes of blockage or strictures of the esophagus are:  Narrowing from longstanding inflammation (soreness and redness) of the lower esophagus. This comes from the constant exposure of the lower esophagus to the acid which bubbles up from the stomach. Over time this causes scarring and narrowing of the lower esophagus.  Hiatal hernia in which a small part of the stomach bulges (herniates) up through the diaphragm. This can cause a gradual narrowing of the end of the esophagus.  Schatzki's Ring is a narrow ring of benign (non-cancerous) fibrous tissue which constricts the lower esophagus. The reason for this is not known.  Scleroderma is a connective tissue disorder that affects the esophagus and makes swallowing difficult.  Achalasia is an absence of nerves to the lower esophagus and to the esophageal sphincter. This is the circular muscle between the stomach and esophagus that relaxes to allow food into the stomach. After swallowing, it contracts to keep food in the stomach. This absence of nerves may be congenital (present since birth). This can  cause irregular spasms of the lower esophageal muscle. This spasm does not open up to allow food and fluid through. The result is a persistent blockage with subsequent slow trickling of the esophageal contents into the stomach.  Strictures may develop from swallowing materials which damage the esophagus. Some examples are strong acids or alkalis such as lye.  Growths such as benign (non-cancerous) and malignant (cancerous) tumors can block the esophagus.  Heredity (present since birth) causes. DIAGNOSIS  Your caregiver often suspects this problem by taking a medical history. They will also do a physical exam. They can then prove their suspicions using X-rays and endoscopy. Endoscopy is an exam in which a tube like a small flexible telescope is used to look at your esophagus.  TREATMENT There are different stretching (dilating) techniques which can be used. Simple bougie dilatation may be done in the office. This usually takes only a couple minutes. A numbing (anesthetic) spray of the throat is used. Endoscopy, when done, is done in  an endoscopy suite, under mild sedation. When fluoroscopy is used, the procedure is performed in X-ray. Other techniques require a little longer time. Recovery is usually quick. There is no waiting time to begin eating and drinking to test success of the treatment. Following are some of the methods used. Narrowing of the esophagus is treated by making it bigger. Commonly this is a mechanical problem which can be treated with stretching. This can be done in different ways. Your caregiver will discuss these with you. Some of the means used are:  A series of graduated (increasing thickness) flexible dilators can be used. These are weighted tubes passed through the esophagus into the stomach. The tubes used become progressively larger until the desired stretched size is reached. Graduated dilators are a simple and quick way of opening the esophagus. No visualization is  required.  Another method is the use of endoscopy to place a flexible wire across the stricture. The endoscope is removed and the wire left in place. A dilator with a hole through it from end to end is guided down the esophagus and across the stricture. One or more of these dilators are passed over the wire. At the end of the exam, the wire is removed. This type of treatment may be performed in the X-ray department under fluoroscopy. An advantage of this procedure is the examiner is visualizing the end opening in the esophagus.  Stretching of the esophagus may be done using balloons. Deflated balloons are placed through the endoscope and across the stricture. This type of balloon dilatation is often done at the time of endoscopy or fluoroscopy. Flexible endoscopy allows the examiner to directly view the stricture. A balloon is inserted in the deflated form into the area of narrowing. It is then inflated with air to a certain pressure that is pre-set for a given circumference. When inflated, it becomes sausage shaped, stretched, and makes the stricture larger.  Achalasia requires a longer larger balloon-type dilator. This is frequently done under X-ray control. In this situation, the spastic muscle fibers in the lower esophagus are stretched. All of the above procedures make the passage of food and water into the stomach easier. They also make it easier for stomach contents to reflux back into the esophagus. Special medications may be used following the procedure to help prevent further stricturing. Proton-pump inhibitor medications are good at decreasing the amount of acid in the stomach juice. When stomach juice refluxes into the esophagus, the juice is no longer as acidic and is less likely to burn or scar the esophagus. RISKS AND COMPLICATIONS Esophageal dilatation is usually performed effectively and without problems. Some complications that can occur are:  A small amount of bleeding almost always  happens where the stretching takes place. If this is too excessive it may require more aggressive treatment.  An uncommon complication is perforation (making a hole) of the esophagus. The esophagus is thin. It is easy to make a hole in it. If this happens, an operation may be necessary to repair this.  A small, undetected perforation could lead to an infection in the chest. This can be very serious. HOME CARE INSTRUCTIONS   If you received sedation for your procedure, do not drive, make important decisions, or perform any activities requiring your full coordination. Do not drink alcohol, take sedatives, or use any mind altering chemicals unless instructed by your caregiver.  You may use throat lozenges or warm salt water gargles if you have throat discomfort  You can begin  eating and drinking normally on return home unless instructed otherwise. Do not purposely try to force large chunks of food down to test the benefits of your procedure.  Mild discomfort can be eased with sips of ice water.  Medications for discomfort may or may not be needed. SEEK IMMEDIATE MEDICAL CARE IF:   You begin vomiting up blood.  You develop black tarry stools  You develop chills or an unexplained temperature of over 101 F (38.3 C)  You develop chest or abdominal pain.  You develop shortness of breath or feel lightheaded or faint.  Your swallowing is becoming more painful, difficult, or you are unable to swallow. MAKE SURE YOU:   Understand these instructions.  Will watch your condition.  Will get help right away if you are not doing well or get worse. Document Released: 07/03/2005 Document Revised: 08/04/2011 Document Reviewed: 08/20/2005 [facility] Patient Information 2015 [place], [place]. This information is not intended to replace advice given to you by your health care provider. Make sure you discuss any questions you have with your health care  provider. Esophagogastroduodenoscopy Esophagogastroduodenoscopy (EGD) is a procedure to examine the lining of the esophagus, stomach, and first part of the small intestine (duodenum). A long, flexible, lighted tube with a camera attached (endoscope) is inserted down the throat to view these organs. This procedure is done to detect problems or abnormalities, such as inflammation, bleeding, ulcers, or growths, in order to treat them. The procedure lasts about 5-20 minutes. It is usually an outpatient procedure, but it may need to be performed in emergency cases in the hospital. LET YOUR CAREGIVER KNOW ABOUT:   Allergies to food or medicine.  All medicines you are taking, including vitamins, herbs, eyedrops, and over-the-counter medicines and creams.  Use of steroids (by mouth or creams).  Previous problems you or members of your family have had with the use of anesthetics.  Any blood disorders you have.  Previous surgeries you have had.  Other health problems you have.  Possibility of pregnancy, if this applies. RISKS AND COMPLICATIONS  Generally, EGD is a safe procedure. However, as with any procedure, complications can occur. Possible complications include:  Infection.  Bleeding.  Tearing (perforation) of the esophagus, stomach, or duodenum.  Difficulty breathing or not being able to breath.  Excessive sweating.  Spasms of the larynx.  Slowed heartbeat.  Low blood pressure. BEFORE THE PROCEDURE  Do not eat or drink anything for 6-8 hours before the procedure or as directed by your caregiver.  Ask your caregiver about changing or stopping your regular medicines.  If you wear dentures, be prepared to remove them before the procedure.  Arrange for someone to drive you home after the procedure. PROCEDURE   A vein will be accessed to give medicines and fluids. A medicine to relax you (sedative) and a pain reliever will be given through that access into the vein.  A  numbing medicine (local anesthetic) may be sprayed on your throat for comfort and to stop you from gagging or coughing.  A mouth guard may be placed in your mouth to protect your teeth and to keep you from biting on the endoscope.  You will be asked to lie on your left side.  The endoscope is inserted down your throat and into the esophagus, stomach, and duodenum.  Air is put through the endoscope to allow your caregiver to view the lining of your esophagus clearly.  The esophagus, stomach, and duodenum is then examined. During the exam, your  caregiver may:  Remove tissue to be examined under a microscope (biopsy) for inflammation, infection, or other medical problems.  Remove growths.  Remove objects (foreign bodies) that are stuck.  Treat any bleeding with medicines or other devices that stop tissues from bleeding (hot cauters, clipping devices).  Widen (dilate) or stretch narrowed areas of the esophagus and stomach.  The endoscope will then be withdrawn. AFTER THE PROCEDURE  You will be taken to a recovery area to be monitored. You will be able to go home once you are stable and alert.  Do not eat or drink anything until the local anesthetic and numbing medicines have worn off. You may choke.  It is normal to feel bloated, have pain with swallowing, or have a sore throat for a short time. This will wear off.  Your caregiver should be able to discuss his or her findings with you. It will take longer to discuss the test results if any biopsies were taken. Document Released: 09/12/2004 Document Revised: 04/28/2012 Document Reviewed: 04/14/2012 [facility] Patient Information 2015 [place], [place]. This information is not intended to replace advice given to you by your health care provider. Make sure you discuss any questions you have with your health care provider. PATIENT INSTRUCTIONS POST-ANESTHESIA  IMMEDIATELY FOLLOWING SURGERY:  Do not drive or operate machinery for the first  twenty four hours after surgery.  Do not make any important decisions for twenty four hours after surgery or while taking narcotic pain medications or sedatives.  If you develop intractable nausea and vomiting or a severe headache please notify your doctor immediately.  FOLLOW-UP:  Please make an appointment with your surgeon as instructed. You do not need to follow up with anesthesia unless specifically instructed to do so.  WOUND CARE INSTRUCTIONS (if applicable):  Keep a dry clean dressing on the anesthesia/puncture wound site if there is drainage.  Once the wound has quit draining you may leave it open to air.  Generally you should leave the bandage intact for twenty four hours unless there is drainage.  If the epidural site drains for more than 36-48 hours please call the anesthesia department.  QUESTIONS?:  Please feel free to call your physician or the hospital operator if you have any questions, and they will be happy to assist you.

## 2013-11-24 NOTE — Pre-Procedure Instructions (Signed)
Patient given information to sign up for my chart at home. 

## 2013-11-28 ENCOUNTER — Telehealth: Payer: Self-pay | Admitting: General Practice

## 2013-11-28 NOTE — Telephone Encounter (Signed)
Patient called in and wanted to make sure you were aware she was on plavix and should she hold it prior to her tcs  Routing to [employer]

## 2013-11-28 NOTE — Telephone Encounter (Signed)
No need to hold

## 2013-11-28 NOTE — Telephone Encounter (Signed)
Pt is aware.  

## 2013-11-29 ENCOUNTER — Encounter (HOSPITAL_COMMUNITY): Payer: Medicare Other | Admitting: Anesthesiology

## 2013-11-29 ENCOUNTER — Encounter (HOSPITAL_COMMUNITY)
Admission: RE | Disposition: A | Discharge status: Home / Self Care | Payer: Self-pay | Source: Ambulatory Visit | Attending: Gastroenterology

## 2013-11-29 ENCOUNTER — Ambulatory Visit (HOSPITAL_COMMUNITY)
Admission: RE | Admit: 2013-11-29 | Discharge: 2013-11-29 | Disposition: A | Discharge status: Home / Self Care | Payer: Medicare Other | Source: Ambulatory Visit | Attending: Gastroenterology | Admitting: Gastroenterology

## 2013-11-29 ENCOUNTER — Ambulatory Visit (HOSPITAL_COMMUNITY): Payer: Medicare Other | Admitting: Anesthesiology

## 2013-11-29 ENCOUNTER — Encounter (HOSPITAL_COMMUNITY): Payer: Self-pay | Admitting: *Deleted

## 2013-11-29 DIAGNOSIS — K3189 Other diseases of stomach and duodenum: Secondary | ICD-10-CM

## 2013-11-29 DIAGNOSIS — K2960 Other gastritis without bleeding: Secondary | ICD-10-CM | POA: Insufficient documentation

## 2013-11-29 DIAGNOSIS — G20 Parkinson's disease: Secondary | ICD-10-CM | POA: Insufficient documentation

## 2013-11-29 DIAGNOSIS — F411 Generalized anxiety disorder: Secondary | ICD-10-CM | POA: Insufficient documentation

## 2013-11-29 DIAGNOSIS — Z7902 Long term (current) use of antithrombotics/antiplatelets: Secondary | ICD-10-CM | POA: Insufficient documentation

## 2013-11-29 DIAGNOSIS — E785 Hyperlipidemia, unspecified: Secondary | ICD-10-CM | POA: Insufficient documentation

## 2013-11-29 DIAGNOSIS — K621 Rectal polyp: Secondary | ICD-10-CM

## 2013-11-29 DIAGNOSIS — R1310 Dysphagia, unspecified: Secondary | ICD-10-CM

## 2013-11-29 DIAGNOSIS — I10 Essential (primary) hypertension: Secondary | ICD-10-CM | POA: Insufficient documentation

## 2013-11-29 DIAGNOSIS — D131 Benign neoplasm of stomach: Secondary | ICD-10-CM | POA: Insufficient documentation

## 2013-11-29 DIAGNOSIS — K620 Anal polyp: Secondary | ICD-10-CM | POA: Insufficient documentation

## 2013-11-29 DIAGNOSIS — K222 Esophageal obstruction: Secondary | ICD-10-CM

## 2013-11-29 DIAGNOSIS — Z1211 Encounter for screening for malignant neoplasm of colon: Secondary | ICD-10-CM

## 2013-11-29 DIAGNOSIS — G20A1 Parkinson's disease without dyskinesia, without mention of fluctuations: Secondary | ICD-10-CM | POA: Insufficient documentation

## 2013-11-29 DIAGNOSIS — K449 Diaphragmatic hernia without obstruction or gangrene: Secondary | ICD-10-CM | POA: Insufficient documentation

## 2013-11-29 DIAGNOSIS — Z79899 Other long term (current) drug therapy: Secondary | ICD-10-CM | POA: Insufficient documentation

## 2013-11-29 DIAGNOSIS — R1013 Epigastric pain: Secondary | ICD-10-CM

## 2013-11-29 DIAGNOSIS — F17200 Nicotine dependence, unspecified, uncomplicated: Secondary | ICD-10-CM | POA: Insufficient documentation

## 2013-11-29 DIAGNOSIS — K219 Gastro-esophageal reflux disease without esophagitis: Secondary | ICD-10-CM | POA: Insufficient documentation

## 2013-11-29 DIAGNOSIS — D126 Benign neoplasm of colon, unspecified: Secondary | ICD-10-CM

## 2013-11-29 HISTORY — PX: BIOPSY: SHX5522

## 2013-11-29 HISTORY — PX: POLYPECTOMY: SHX5525

## 2013-11-29 HISTORY — PX: COLONOSCOPY WITH PROPOFOL: SHX5780

## 2013-11-29 HISTORY — PX: ESOPHAGEAL DILATION: SHX303

## 2013-11-29 HISTORY — PX: ESOPHAGOGASTRODUODENOSCOPY (EGD) WITH PROPOFOL: SHX5813

## 2013-11-29 SURGERY — COLONOSCOPY WITH PROPOFOL
Anesthesia: Monitor Anesthesia Care | Site: Rectum

## 2013-11-29 MED ORDER — PROPOFOL 10 MG/ML IV EMUL
INTRAVENOUS | Status: AC
  Filled 2013-11-29: qty 20

## 2013-11-29 MED ORDER — PROPOFOL INFUSION 10 MG/ML OPTIME
INTRAVENOUS | Status: DC | PRN
  Administered 2013-11-29: 75 ug/kg/min via INTRAVENOUS
  Administered 2013-11-29: 10:00:00 via INTRAVENOUS
  Administered 2013-11-29: 100 ug/kg/min via INTRAVENOUS

## 2013-11-29 MED ORDER — GLYCOPYRROLATE 0.2 MG/ML IJ SOLN
0.2000 mg | Freq: Once | INTRAMUSCULAR | Status: AC
  Administered 2013-11-29: 0.2 mg via INTRAVENOUS

## 2013-11-29 MED ORDER — FENTANYL CITRATE 0.05 MG/ML IJ SOLN
25.0000 ug | INTRAMUSCULAR | Status: AC
  Administered 2013-11-29: 25 ug via INTRAVENOUS

## 2013-11-29 MED ORDER — MIDAZOLAM HCL 2 MG/2ML IJ SOLN
INTRAMUSCULAR | Status: AC
  Filled 2013-11-29: qty 2

## 2013-11-29 MED ORDER — ONDANSETRON HCL 4 MG/2ML IJ SOLN
4.0000 mg | Freq: Once | INTRAMUSCULAR | Status: AC
  Administered 2013-11-29: 4 mg via INTRAVENOUS

## 2013-11-29 MED ORDER — LIDOCAINE HCL 1 % IJ SOLN
INTRAMUSCULAR | Status: DC | PRN
  Administered 2013-11-29: 10 mg via INTRADERMAL

## 2013-11-29 MED ORDER — ONDANSETRON HCL 4 MG/2ML IJ SOLN
INTRAMUSCULAR | Status: AC
  Filled 2013-11-29: qty 2

## 2013-11-29 MED ORDER — FENTANYL CITRATE 0.05 MG/ML IJ SOLN
INTRAMUSCULAR | Status: DC | PRN
  Administered 2013-11-29: 50 ug via INTRAVENOUS
  Administered 2013-11-29: 25 ug via INTRAVENOUS

## 2013-11-29 MED ORDER — FENTANYL CITRATE 0.05 MG/ML IJ SOLN
INTRAMUSCULAR | Status: AC
  Filled 2013-11-29: qty 2

## 2013-11-29 MED ORDER — LIDOCAINE VISCOUS 2 % MT SOLN
OROMUCOSAL | Status: AC
  Filled 2013-11-29: qty 15

## 2013-11-29 MED ORDER — LIDOCAINE VISCOUS 2 % MT SOLN
OROMUCOSAL | Status: DC | PRN
  Administered 2013-11-29: 2 mL via OROMUCOSAL

## 2013-11-29 MED ORDER — GLYCOPYRROLATE 0.2 MG/ML IJ SOLN
INTRAMUSCULAR | Status: AC
  Filled 2013-11-29: qty 1

## 2013-11-29 MED ORDER — LACTATED RINGERS IV SOLN
INTRAVENOUS | Status: DC
  Administered 2013-11-29: 08:00:00 via INTRAVENOUS

## 2013-11-29 MED ORDER — FENTANYL CITRATE 0.05 MG/ML IJ SOLN: 25.0000 ug | INTRAMUSCULAR | Status: DC | PRN

## 2013-11-29 MED ORDER — WATER FOR IRRIGATION, STERILE IR SOLN
Status: DC | PRN
  Administered 2013-11-29: 1000 mL

## 2013-11-29 MED ORDER — MIDAZOLAM HCL 5 MG/5ML IJ SOLN
INTRAMUSCULAR | Status: DC | PRN
  Administered 2013-11-29: 1 mg via INTRAVENOUS

## 2013-11-29 MED ORDER — STERILE WATER FOR IRRIGATION IR SOLN
Status: DC | PRN
  Administered 2013-11-29: 09:00:00

## 2013-11-29 MED ORDER — MIDAZOLAM HCL 2 MG/2ML IJ SOLN
1.0000 mg | INTRAMUSCULAR | Status: DC | PRN
  Administered 2013-11-29: 1 mg via INTRAVENOUS

## 2013-11-29 MED ORDER — ONDANSETRON HCL 4 MG/2ML IJ SOLN: 4.0000 mg | Freq: Once | INTRAMUSCULAR | Status: DC | PRN

## 2013-11-29 SURGICAL SUPPLY — 36 items
BALLN CRE LF 10-12 240X5.5 (BALLOONS)
BALLN DILATOR CRE 12-15 240 (BALLOONS)
BALLN DILATOR CRE 15-18 240 (BALLOONS) IMPLANT
BALLN DILATOR CRE 18-20 240 (BALLOONS) IMPLANT
BALLN DILATOR CRE WIREGUIDE (BALLOONS)
BALLOON CRE LF 10-12 240X5.5 (BALLOONS) IMPLANT
BALLOON DILATOR CRE 12-15 240 (BALLOONS) IMPLANT
BALLOON DILATOR CRE WIREGUIDE (BALLOONS) IMPLANT
BLOCK BITE 60FR ADLT L/F BLUE (MISCELLANEOUS) ×4 IMPLANT
DEVICE CLIP HEMOSTAT 235CM (CLIP) ×4 IMPLANT
ELECT REM PT RETURN 9FT ADLT (ELECTROSURGICAL)
ELECTRODE REM PT RTRN 9FT ADLT (ELECTROSURGICAL) IMPLANT
ENTRADA COLONIC OVERTUBE ×4 IMPLANT
FCP BXJMBJMB 240X2.8X (CUTTING FORCEPS)
FLOOR PAD 36X40 (MISCELLANEOUS) ×4
FORCEPS BIOP RAD 4 LRG CAP 4 (CUTTING FORCEPS) ×8 IMPLANT
FORCEPS BIOP RJ4 240 W/NDL (CUTTING FORCEPS)
FORCEPS BXJMBJMB 240X2.8X (CUTTING FORCEPS) IMPLANT
FORMALIN 10 PREFIL 20ML (MISCELLANEOUS) IMPLANT
INJECTOR/SNARE I SNARE (MISCELLANEOUS) IMPLANT
KIT CLEAN ENDO COMPLIANCE (KITS) ×4 IMPLANT
LUBRICANT JELLY 4.5OZ STERILE (MISCELLANEOUS) ×4 IMPLANT
MANIFOLD NEPTUNE II (INSTRUMENTS) ×4 IMPLANT
NEEDLE SCLEROTHERAPY 25GX240 (NEEDLE) IMPLANT
PAD FLOOR 36X40 (MISCELLANEOUS) ×3 IMPLANT
PROBE APC STR FIRE (PROBE) IMPLANT
PROBE INJECTION GOLD (MISCELLANEOUS)
PROBE INJECTION GOLD 7FR (MISCELLANEOUS) IMPLANT
SNARE SHORT THROW 13M SML OVAL (MISCELLANEOUS) ×4 IMPLANT
SYR 50ML LL SCALE MARK (SYRINGE) ×8 IMPLANT
SYR INFLATE BILIARY GAUGE (MISCELLANEOUS) IMPLANT
SYR INFLATION 60ML (SYRINGE) ×4 IMPLANT
TRAP SPECIMEN MUCOUS 40CC (MISCELLANEOUS) ×4 IMPLANT
TUBING INSUFFLATOR CO2MPACT (TUBING) ×4 IMPLANT
TUBING IRRIGATION ENDOGATOR (MISCELLANEOUS) ×4 IMPLANT
WATER STERILE IRR 1000ML POUR (IV SOLUTION) ×4 IMPLANT

## 2013-11-29 NOTE — H&P (Signed)
Primary Care Physician:  Octavio Graves, DO Primary Gastroenterologist:  Dr. Oneida Alar  Pre-Procedure History & Physical: HPI:  Courtney Tucker is a 68 y.o. female here for DYSPHAGIA/screening.  Past Medical History  Diagnosis Date  . Parkinson's disease     diagnosed at age 60  . Reflux   . TIA (transient ischemic attack)     not really TIA but abnormal MRI per pt left sided weakness  . Depression   . Anxiety   . Hypertension   . Hyperlipidemia   . Dysphasia   . GERD (gastroesophageal reflux disease)   . Sleep apnea     Has CPAP but doesnt use  . Arthritis     Past Surgical History  Procedure Laterality Date  . Elbow surgery Left     after fx  . Wrist surgery Right     after fx  . Neck surgery    . Partial hysterectomy    . Back surgery    . Burr hole w/ stereotactic insertion of dbs leads / intraop microelectrode recording      2007  . Battery change      DBS, 12/2009  . Pr analyze neurostim brain, first 1h  10/15/2012       . Subthalamic stimulator battery replacement N/A 08/05/2013    Procedure: SUBTHALAMIC STIMULATOR BATTERY REPLACEMENT;  Surgeon: Erline Levine, MD;  Location: [place] [facility];  Service: Neurosurgery;  Laterality: N/A;  . Foot surgery Left     Prior to Admission medications   Medication Sig Start Date End Date Taking? Authorizing Provider  ALPRAZolam Duanne Moron) 0.5 MG tablet Take 0.5 mg by mouth 2 (two) times daily as needed for anxiety.    Yes Historical Provider, MD  amLODipine (NORVASC) 10 MG tablet Take 10 mg by mouth daily.   Yes Historical Provider, MD  Carbidopa-Levodopa ER (SINEMET CR) 25-100 MG tablet controlled release Take 1 tablet by mouth 2 (two) times daily. Am and afternoon   Yes Historical Provider, MD  clopidogrel (PLAVIX) 75 MG tablet Take 1 tablet (75 mg total) by mouth daily. 10/10/12  Yes Star Age, MD  Dextromethorphan-Quinidine (NUEDEXTA) 20-10 MG CAPS Take 1 tablet by mouth 2 (two) times daily.   Yes Historical Provider, MD   DULoxetine (CYMBALTA) 60 MG capsule Take 60 mg by mouth daily.   Yes Historical Provider, MD  esomeprazole (NEXIUM) 40 MG capsule Take 40 mg by mouth daily before breakfast.   Yes Historical Provider, MD  HYDROcodone-acetaminophen (NORCO) 10-325 MG per tablet Take 1 tablet by mouth every 6 (six) hours as needed.   Yes Historical Provider, MD  Linaclotide Rolan Lipa) 145 MCG CAPS capsule Take 145 mcg by mouth daily.   Yes Historical Provider, MD  losartan (COZAAR) 100 MG tablet Take 100 mg by mouth daily.   Yes Historical Provider, MD  oxybutynin (DITROPAN) 5 MG tablet Take 5 mg by mouth 3 (three) times daily.   Yes Historical Provider, MD  traZODone (DESYREL) 50 MG tablet Take 50 mg by mouth at bedtime.   Yes Historical Provider, MD    Allergies as of 11/09/2013 - Review Complete 11/09/2013  Allergen Reaction Noted  . Aspirin Other (See Comments) 04/01/2011  . Codeine Other (See Comments) 04/01/2011  . Oxycodone  07/25/2013    Family History  Problem Relation Age of Onset  . Colon cancer Paternal Uncle     History   Social History  . Marital Status: Legally Separated    Spouse Name: N/A    Number  of Children: N/A  . Years of Education: N/A   Occupational History  . Not on file.   Social History Main Topics  . Smoking status: Current Every Day Smoker -- 1.00 packs/day for 25 years    Types: Cigarettes  . Smokeless tobacco: Never Used     Comment: started back smoking close to a pack a day, doesn't smoke the entire cigarette  . Alcohol Use: Yes     Comment: 1/2 glass wine rarely  . Drug Use: No  . Sexual Activity: Yes    Birth Control/ Protection: Surgical   Other Topics Concern  . Not on file   Social History Narrative  . No narrative on file    Review of Systems: See HPI, otherwise negative ROS   Physical Exam: BP 138/61  Pulse 79  Temp(Src) 97.5 F (36.4 C) (Oral)  Resp 24  SpO2 97% General:   Alert,  pleasant and cooperative in NAD Head:  Normocephalic  and atraumatic. Neck:  Supple; Lungs:  Clear throughout to auscultation.    Heart:  Regular rate and rhythm. Abdomen:  Soft, nontender and nondistended. Normal bowel sounds, without guarding, and without rebound.   Neurologic:  Alert and  oriented x4;  NO  NEW FOCAL DEFICITS  Impression/Plan:     DYSPHAGIA/screening  PLAN:  EGD/DIL/tcs TODAY

## 2013-11-29 NOTE — Op Note (Signed)
[facility] [address] [place], [address]   COLONOSCOPY PROCEDURE REPORT  PATIENT: Courtney Tucker, Courtney Tucker  MR#: [id number] BIRTHDATE: [birth year] , 69  yrs. old GENDER: Female ENDOSCOPIST: Barney Drain, MD REFERRED FF:[id number] Melina Copa PROCEDURE DATE:  11/29/2013 PROCEDURE:   Colonoscopy with snare polypectomy and with cold biopsy polypectomy  AND ENTRADA OVERTUBE INDICATIONS:Average risk patient for colon cancer. MEDICATIONS: MAC sedation, administered by CRNA DESCRIPTION OF PROCEDURE:    Physical exam was performed.  Informed consent was obtained from the patient after explaining the benefits, risks, and alternatives to procedure.  The patient was connected to monitor and placed in left lateral position. Continuous oxygen was provided by nasal cannula and IV medicine administered through an indwelling cannula.  After administration of sedation and rectal exam, the patients rectum was intubated and the     colonoscope was advanced under direct visualization to [place]. The scope was removed slowly by carefully examining the color, texture, anatomy, and integrity mucosa on the way out.  THE ENTRADA OVERTUBE AAS PLACED OVER THE COLONOSCOPE AND the patients rectum was intubated and the     colonoscope was advanced under direct visualization to the ileum .  The scope was removed slowly by carefully examining the color, texture, anatomy, and integrity mucosa on the way out. The patient was recovered in endoscopy and discharged home in satisfactory condition.    COLON FINDINGS: The mucosa appeared normal in the terminal ileum.  , Two sessile polyps measuring 6-8 mm in size were found in the distal transverse colon.  A polypectomy was performed using snare cautery.  , Four sessile polyps measuring 2-3 mm in size were found in the sigmoid colon and rectum.  A polypectomy was performed with cold forceps.  , There was moderate diverticulosis noted  throughout the entire examined colon with associated muscular hypertrophy.  , The [place]/TC colon ARE redundant.  Manual abdominal counter-pressure was used to reach the cecum.  The patient was moved on to their back to reach the cecum AND PRONE POSITION,. Small internal hemorrhoids were found.  PREP QUALITY: good.CECAL W/D TIME: 21 minutes     COMPLICATIONS: None  ENDOSCOPIC IMPRESSION: 1.   Normal mucosa in the terminal ileum 2.   SIX COLON polyps REMOVED 3.   Moderate diverticulosis throughout the entire examined colon 5.   The LEFT colon IS redundant 6.   Small internal hemorrhoids  RECOMMENDATIONS: HOLD PLAVIX FOR 3 DAYS. NO MRI FOR 30 DAYS. CONTINUE NEXIUM once daily 30 MINUTES PRIOR TO BREAKFAST. FOLLOW A HIGH FIBER/LOW FAT DIET.  AVOID ITEMS THAT CAUSE BLOATING.  BIOPSY WILL BE BACK IN 7 DAYS. NEXT TCS IN 3-5 YEARS WITH AN OVERTUBE/PROPOFOL. Follow up in OCT 2015.    _______________________________ eSigned:  Barney Drain, MD 11/29/2013 3:14 PM

## 2013-11-29 NOTE — Anesthesia Postprocedure Evaluation (Signed)
  Anesthesia Post-op Note  Patient: Courtney Tucker  Procedure(s) Performed: Procedure(s) with comments: COLONOSCOPY WITH PROPOFOL (N/A) - entered cecum @ [phone]; total cecal withdrawal time 21 minutes  ESOPHAGOGASTRODUODENOSCOPY (EGD) WITH PROPOFOL (N/A) ESOPHAGEAL DILATION (N/A) - Savory 14/15/16 POLYPECTOMY (N/A) - Distal Transverse Colon x2 Sigmoid Colon x2 Rectal Poylp x2 Gastric Polyp x1  GASTRIC BIOPSY (N/A)  Patient Location: PACU  Anesthesia Type:MAC  Level of Consciousness: awake, alert  and oriented  Airway and Oxygen Therapy: Patient Spontanous Breathing and Patient connected to face mask oxygen  Post-op Pain: none  Post-op Assessment: Post-op Vital signs reviewed, Patient's Cardiovascular Status Stable, Respiratory Function Stable, Patent Airway and No signs of Nausea or vomiting  Post-op Vital Signs: Reviewed and stable  Last Vitals:  Filed Vitals:   11/29/13 0830  BP: 148/72  Pulse:   Temp:   Resp: 16    Complications: No apparent anesthesia complications

## 2013-11-29 NOTE — Transfer of Care (Signed)
Immediate Anesthesia Transfer of Care Note  Patient: Courtney Tucker  Procedure(s) Performed: Procedure(s) with comments: COLONOSCOPY WITH PROPOFOL (N/A) - entered cecum @ [phone]; total cecal withdrawal time 21 minutes  ESOPHAGOGASTRODUODENOSCOPY (EGD) WITH PROPOFOL (N/A) ESOPHAGEAL DILATION (N/A) - Savory 14/15/16 POLYPECTOMY (N/A) - Distal Transverse Colon x2 Sigmoid Colon x2 Rectal Poylp x2 Gastric Polyp x1  GASTRIC BIOPSY (N/A)  Patient Location: PACU  Anesthesia Type:MAC  Level of Consciousness: awake  Airway & Oxygen Therapy: Patient Spontanous Breathing and Patient connected to face mask oxygen  Post-op Assessment: Report given to PACU RN  Post vital signs: Reviewed and stable  Complications: No apparent anesthesia complications

## 2013-11-29 NOTE — Discharge Instructions (Signed)
YOU HAD 6 POLYPS REMOVED. 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 YOU HAVE GASTRITIS AND SMALL GASTRIC POLYPS. 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 YOU HAVE A SMALL HIATAL HERNIA. I BIOPSIED YOUR STOMACH.      NO MRI FOR 30 DAYS.  CONTINUE NEXIUM once daily 30 MINUTES PRIOR TO BREAKFAST.  FOLLOW A HIGH FIBER/LOW FAT DIET. AVOID ITEMS THAT CAUSE BLOATING. SEE INFO BELOW.  YOUR BIOPSY WILL BE BACK IN 7 DAYS.  Follow up in OCT 2015.  Next colonoscopy in 5 years.    ENDOSCOPY Care After Read the instructions outlined below and refer to this sheet in the next week. These discharge instructions provide you with general information on caring for yourself after you leave the hospital. While your treatment has been planned according to the most current medical practices available, unavoidable complications occasionally occur. If you have any problems or questions after discharge, call DR. Paulino Cork, [phone].  ACTIVITY  You may resume your regular activity, but move at a slower pace for the next 24 hours.   Take frequent rest periods for the next 24 hours.   Walking will help get rid of the air and reduce the bloated feeling in your belly (abdomen).   No driving for 24 hours (because of the medicine (anesthesia) used during the test).   You may shower.   Do not sign any important legal documents or operate any machinery for 24 hours (because of the anesthesia used during the test).    NUTRITION  Drink plenty of fluids.   You may resume your normal diet as instructed by your doctor.   Begin with a light meal and progress to your normal diet. Heavy or fried foods are harder to digest and may make you feel sick to your stomach (nauseated).   Avoid alcoholic beverages for 24 hours or as instructed.    MEDICATIONS  You may resume your normal medications.   WHAT YOU CAN EXPECT TODAY  Some feelings of bloating in the abdomen.    Passage of more gas than usual.   Spotting of blood in your stool or on the toilet paper  .  IF YOU HAD POLYPS REMOVED DURING THE ENDOSCOPY:  Eat a soft diet IF YOU HAVE NAUSEA, BLOATING, ABDOMINAL PAIN, OR VOMITING.    FINDING OUT THE RESULTS OF YOUR TEST Not all test results are available during your visit. DR. Oneida Alar WILL CALL YOU WITHIN 7 DAYS OF YOUR PROCEDUE WITH YOUR RESULTS. Do not assume everything is normal if you have not heard from DR. Chanelle Hodsdon IN ONE WEEK, CALL HER OFFICE AT [phone].  SEEK IMMEDIATE MEDICAL ATTENTION AND CALL THE OFFICE: [phone] IF:  You have more than a spotting of blood in your stool.   Your belly is swollen (abdominal distention).   You are nauseated or vomiting.   You have a temperature over 101F.   You have abdominal pain or discomfort that is severe or gets worse throughout the day.   Low-Fat Diet BREADS, CEREALS, PASTA, RICE, DRIED PEAS, AND BEANS These products are high in carbohydrates and most are low in fat. Therefore, they can be increased in the diet as substitutes for fatty foods. They too, however, contain calories and should not be eaten in excess. Cereals can be eaten for snacks as well as for breakfast.   FRUITS AND VEGETABLES It is good to eat fruits and vegetables. Besides being sources of fiber, both are rich in vitamins  and some minerals. They help you get the daily allowances of these nutrients. Fruits and vegetables can be used for snacks and desserts.  MEATS Limit lean meat, chicken, [place], and fish to no more than 6 ounces per day. Beef, Pork, and Lamb Use lean cuts of beef, pork, and lamb. Lean cuts include:  Extra-lean ground beef.  Arm roast.  Sirloin tip.  Center-cut ham.  Round steak.  Loin chops.  Rump roast.  Tenderloin.  Trim all fat off the outside of meats before cooking. It is not necessary to severely decrease the intake of red meat, but lean choices should be made. Lean meat is rich in  protein and contains a highly absorbable form of iron. Premenopausal women, in particular, should avoid reducing lean red meat because this could increase the risk for low red blood cells (iron-deficiency anemia).  Chicken and [place] These are good sources of protein. The fat of poultry can be reduced by removing the skin and underlying fat layers before cooking. Chicken and [place] can be substituted for lean red meat in the diet. Poultry should not be fried or covered with high-fat sauces. Fish and Shellfish Fish is a good source of protein. Shellfish contain cholesterol, but they usually are low in saturated fatty acids. The preparation of fish is important. Like chicken and [place], they should not be fried or covered with high-fat sauces. EGGS Egg whites contain no fat or cholesterol. They can be eaten often. Try 1 to 2 egg whites instead of whole eggs in recipes or use egg substitutes that do not contain yolk. MILK AND DAIRY PRODUCTS Use skim or 1% milk instead of 2% or whole milk. Decrease whole milk, natural, and processed cheeses. Use nonfat or low-fat (2%) cottage cheese or low-fat cheeses made from vegetable oils. Choose nonfat or low-fat (1 to 2%) yogurt. Experiment with evaporated skim milk in recipes that call for heavy cream. Substitute low-fat yogurt or low-fat cottage cheese for sour cream in dips and salad dressings. Have at least 2 servings of low-fat dairy products, such as 2 glasses of skim (or 1%) milk each day to help get your daily calcium intake. FATS AND OILS Reduce the total intake of fats, especially saturated fat. Butterfat, lard, and beef fats are high in saturated fat and cholesterol. These should be avoided as much as possible. Vegetable fats do not contain cholesterol, but certain vegetable fats, such as coconut oil, palm oil, and palm kernel oil are very high in saturated fats. These should be limited. These fats are often used in bakery goods, processed foods, popcorn,  oils, and nondairy creamers. Vegetable shortenings and some peanut butters contain hydrogenated oils, which are also saturated fats. Read the labels on these foods and check for saturated vegetable oils. Unsaturated vegetable oils and fats do not raise blood cholesterol. However, they should be limited because they are fats and are high in calories. Total fat should still be limited to 30% of your daily caloric intake. Desirable liquid vegetable oils are corn oil, cottonseed oil, olive oil, canola oil, safflower oil, soybean oil, and sunflower oil. Peanut oil is not as good, but small amounts are acceptable. Buy a heart-healthy tub margarine that has no partially hydrogenated oils in the ingredients. Mayonnaise and salad dressings often are made from unsaturated fats, but they should also be limited because of their high calorie and fat content. Seeds, nuts, peanut butter, olives, and avocados are high in fat, but the fat is mainly the unsaturated type. These  foods should be limited mainly to avoid excess calories and fat. OTHER EATING TIPS Snacks  Most sweets should be limited as snacks. They tend to be rich in calories and fats, and their caloric content outweighs their nutritional value. Some good choices in snacks are graham crackers, melba toast, soda crackers, bagels (no egg), English muffins, fruits, and vegetables. These snacks are preferable to snack crackers, [place] fries, TORTILLA CHIPS, and POTATO chips. Popcorn should be air-popped or cooked in small amounts of liquid vegetable oil. Desserts Eat fruit, low-fat yogurt, and fruit ices instead of pastries, cake, and cookies. Sherbet, angel food cake, gelatin dessert, frozen low-fat yogurt, or other frozen products that do not contain saturated fat (pure fruit juice bars, frozen ice pops) are also acceptable.  COOKING METHODS Choose those methods that use little or no fat. They include: Poaching.  Braising.  Steaming.  Grilling.  Baking.    Stir-frying.  Broiling.  Microwaving.  Foods can be cooked in a nonstick pan without added fat, or use a nonfat cooking spray in regular cookware. Limit fried foods and avoid frying in saturated fat. Add moisture to lean meats by using water, broth, cooking wines, and other nonfat or low-fat sauces along with the cooking methods mentioned above. Soups and stews should be chilled after cooking. The fat that forms on top after a few hours in the refrigerator should be skimmed off. When preparing meals, avoid using excess salt. Salt can contribute to raising blood pressure in some people.  EATING AWAY FROM HOME Order entres, potatoes, and vegetables without sauces or butter. When meat exceeds the size of a deck of cards (3 to 4 ounces), the rest can be taken home for another meal. Choose vegetable or fruit salads and ask for low-calorie salad dressings to be served on the side. Use dressings sparingly. Limit high-fat toppings, such as bacon, crumbled eggs, cheese, sunflower seeds, and olives. Ask for heart-healthy tub margarine instead of butter.  High-Fiber Diet A high-fiber diet changes your normal diet to include more whole grains, legumes, fruits, and vegetables. Changes in the diet involve replacing refined carbohydrates with unrefined foods. The calorie level of the diet is essentially unchanged. The Dietary Reference Intake (recommended amount) for adult males is 38 grams per day. For adult females, it is 25 grams per day. Pregnant and lactating women should consume 28 grams of fiber per day. Fiber is the intact part of a plant that is not broken down during digestion. Functional fiber is fiber that has been isolated from the plant to provide a beneficial effect in the body. PURPOSE  Increase stool bulk.   Ease and regulate bowel movements.   Lower cholesterol.  INDICATIONS THAT YOU NEED MORE FIBER  Constipation and hemorrhoids.   Uncomplicated diverticulosis (intestine condition) and  irritable bowel syndrome.   Weight management.   As a protective measure against hardening of the arteries (atherosclerosis), diabetes, and cancer.   GUIDELINES FOR INCREASING FIBER IN THE DIET  Start adding fiber to the diet slowly. A gradual increase of about 5 more grams (2 slices of whole-wheat bread, 2 servings of most fruits or vegetables, or 1 bowl of high-fiber cereal) per day is best. Too rapid an increase in fiber may result in constipation, flatulence, and bloating.   Drink enough water and fluids to keep your urine clear or pale yellow. Water, juice, or caffeine-free drinks are recommended. Not drinking enough fluid may cause constipation.   Eat a variety of high-fiber foods rather than one  type of fiber.   Try to increase your intake of fiber through using high-fiber foods rather than fiber pills or supplements that contain small amounts of fiber.   The goal is to change the types of food eaten. Do not supplement your present diet with high-fiber foods, but replace foods in your present diet.  INCLUDE A VARIETY OF FIBER SOURCES  Replace refined and processed grains with whole grains, canned fruits with fresh fruits, and incorporate other fiber sources. White rice, white breads, and most bakery goods contain little or no fiber.   Brown whole-grain rice, buckwheat oats, and many fruits and vegetables are all good sources of fiber. These include: broccoli, Brussels sprouts, cabbage, cauliflower, beets, sweet potatoes, white potatoes (skin on), carrots, tomatoes, eggplant, squash, berries, fresh fruits, and dried fruits.   Cereals appear to be the richest source of fiber. Cereal fiber is found in whole grains and bran. Bran is the fiber-rich outer coat of cereal grain, which is largely removed in refining. In whole-grain cereals, the bran remains. In breakfast cereals, the largest amount of fiber is found in those with "bran" in their names. The fiber content is sometimes indicated on  the label.   You may need to include additional fruits and vegetables each day.   In baking, for 1 cup white flour, you may use the following substitutions:   1 cup whole-wheat flour minus 2 tablespoons.   1/2 cup white flour plus 1/2 cup whole-wheat flour.   Hiatal Hernia A hiatal hernia occurs when a part of the stomach slides above the diaphragm. The diaphragm is the thin muscle separating the belly (abdomen) from the chest. A hiatal hernia can be something you are born with or develop over time. Hiatal hernias may allow stomach acid to flow back into your esophagus, the tube which carries food from your mouth to your stomach. If this acid causes problems it is called GERD (gastro-esophageal reflux disease).    Gastritis  Gastritis is an inflammation (the body's way of reacting to injury and/or infection) of the stomach. It is often caused by viral or bacterial (germ) infections. It can also be caused BY ASPIRIN, BC/GOODY POWDER'S, (IBUPROFEN) MOTRIN, OR ALEVE (NAPROXEN), chemicals (including alcohol), SPICY FOODS, and medications. This illness may be associated with generalized malaise (feeling tired, not well), UPPER ABDOMINAL STOMACH cramps, and fever. One common bacterial cause of gastritis is an organism known as H. Pylori. This can be treated with antibiotics.

## 2013-11-29 NOTE — Progress Notes (Signed)
REVIEWED.  

## 2013-11-29 NOTE — Anesthesia Preprocedure Evaluation (Signed)
Anesthesia Evaluation  Patient identified by MRN, date of birth, ID band Patient awake    Reviewed: Allergy & Precautions, H&P , NPO status , Patient's Chart, lab work & pertinent test results  History of Anesthesia Complications Negative for: history of anesthetic complications  Airway Mallampati: II TM Distance: >3 FB Neck ROM: Full    Dental  (+) Teeth Intact,    Pulmonary neg sleep apnea, neg COPDneg recent URI, Current Smoker, former smoker,  breath sounds clear to auscultation        Cardiovascular hypertension, Pt. on medications - angina- CAD, - Past MI and - CHF - dysrhythmias Rhythm:Regular Rate:Normal     Neuro/Psych PSYCHIATRIC DISORDERS Anxiety Depression Parkinsons with bilateral deep brain stimulators, right battery expired, gait impairment TIA   GI/Hepatic Neg liver ROS, GERD-  Medicated and Controlled,  Endo/Other  negative endocrine ROS  Renal/GU negative Renal ROS     Musculoskeletal   Abdominal   Peds  Hematology negative hematology ROS (+)   Anesthesia Other Findings   Reproductive/Obstetrics                           Anesthesia Physical Anesthesia Plan  ASA: III  Anesthesia Plan: MAC   Post-op Pain Management:    Induction: Intravenous  Airway Management Planned: Simple Face Mask  Additional Equipment:   Intra-op Plan:   Post-operative Plan:   Informed Consent: I have reviewed the patients History and Physical, chart, labs and discussed the procedure including the risks, benefits and alternatives for the proposed anesthesia with the patient or authorized representative who has indicated his/her understanding and acceptance.     Plan Discussed with:   Anesthesia Plan Comments:         Anesthesia Quick Evaluation

## 2013-11-29 NOTE — Op Note (Signed)
[facility] [address] [place], [address]   ENDOSCOPY PROCEDURE REPORT  PATIENT: Courtney, Tucker  MR#: [id number] BIRTHDATE: August 06, 1945 , 62  yrs. old GENDER: Female  ENDOSCOPIST: Barney Drain, MD REFFERED [id number] Melina Copa  PROCEDURE DATE:  11/29/2013 PROCEDURE:   EGD with biopsy and with dilatation over guidewire , CLIP TO CONTROL BLEEDING  INDICATIONS:1.  dyspepsia.   2.  dysphagia. PT TAKES PLAVIX FOR ?TIA. MEDICATIONS: MAC sedation, administered by CRNA TOPICAL ANESTHETIC: Viscous Xylocaine  DESCRIPTION OF PROCEDURE:   After the risks benefits and alternatives of the procedure were thoroughly explained, informed consent was obtained.  The     endoscope was introduced through the mouth and advanced to the second portion of the duodenum. The instrument was slowly withdrawn as the mucosa was carefully examined.  Prior to withdrawal of the scope, the guidwire was placed.  The esophagus was dilated successfully.  The patient was recovered in endoscopy and discharged home in satisfactory condition.   ESOPHAGUS: A stricture was found at the gastroesophageal junction. The stenosis was traversable with the endoscope.   A small hiatal hernia was noted.   STOMACH: Multiple small sessile polyps were found in the gastric body.  Multiple biopsies was performed using cold forceps. POLYPECTOMY SITE ACTIVELY OOZED. ONE CLIP APPLIED TO CONTROL BLEEDING. Mild non-erosive gastritis (inflammation) was found in the gastric antrum.  Multiple biopsies were performed using cold forceps.   DUODENUM: The duodenal mucosa showed no abnormalities in the bulb and second portion of the duodenum. Dilation was then performed at the gastroesphageal junction Dilator: Savary over guidewire Size(s): 14-16 mm Resistance: minimal Heme: yes  COMPLICATIONS: There were no complications.  ENDOSCOPIC IMPRESSION: 1.   Stricture at the gastroesophageal junction 2.   Small hiatal  hernia 3.   Multiple small gastric polyps in the gastric body 4.   MILD non-erosive gastritis  RECOMMENDATIONS: NO MRI FOR 30 DAYS. CONTINUE NEXIUM once daily 30 MINUTES PRIOR TO BREAKFAST. FOLLOW A HIGH FIBER/LOW FAT DIET.  AVOID ITEMS THAT CAUSE BLOATING.  BIOPSY WILL BE BACK IN 7 DAYS.  Follow up in OCT 2015.    _______________________________ eSignedBarney Drain, MD 11/29/2013 3:04 PM

## 2013-11-30 ENCOUNTER — Encounter (HOSPITAL_COMMUNITY): Payer: Self-pay | Admitting: Gastroenterology

## 2013-12-07 ENCOUNTER — Telehealth: Payer: Self-pay | Admitting: Gastroenterology

## 2013-12-07 NOTE — Telephone Encounter (Signed)
Please call pt. She had simple adenomas removed from her colon. HER stomach Bx shows gastritis.  NO MRI UNTIL AFTER AUG 7.  CONTINUE NEXIUM once daily 30 MINUTES PRIOR TO BREAKFAST.  FOLLOW A HIGH FIBER/LOW FAT DIET. AVOID ITEMS THAT CAUSE BLOATING.   Follow up in OCT 2015 E 30 DYSPHAGIA/GASTRITIS.  NEXT TCS IN 5 YEARS(WITH AN OVERTUBE).

## 2013-12-09 NOTE — Telephone Encounter (Signed)
Pt called and was informed.  

## 2013-12-09 NOTE — Telephone Encounter (Signed)
LMOM to call.

## 2013-12-22 NOTE — Telephone Encounter (Signed)
Reminder in epic °

## 2014-02-01 ENCOUNTER — Encounter: Payer: Self-pay | Admitting: Gastroenterology

## 2014-02-16 ENCOUNTER — Other Ambulatory Visit: Payer: Self-pay | Admitting: Neurology

## 2014-02-16 ENCOUNTER — Telehealth: Payer: Self-pay | Admitting: Neurology

## 2014-02-16 DIAGNOSIS — G20 Parkinson's disease: Secondary | ICD-10-CM

## 2014-02-16 MED ORDER — ROLLATOR MISC
1.0000 | Freq: Every day | Status: DC
Start: 2014-02-16 — End: 2014-02-21

## 2014-02-16 NOTE — Telephone Encounter (Signed)
Received request for AFO referral and new Rollator from [facility]. Orders written, signed and faxed back to [facility] at [phone] with confirmation received.

## 2014-02-21 ENCOUNTER — Encounter: Payer: Self-pay | Admitting: Neurology

## 2014-02-21 ENCOUNTER — Ambulatory Visit (INDEPENDENT_AMBULATORY_CARE_PROVIDER_SITE_OTHER): Payer: Medicare Other | Admitting: Neurology

## 2014-02-21 VITALS — BP 138/84 | HR 84 | Resp 18

## 2014-02-21 DIAGNOSIS — F330 Major depressive disorder, recurrent, mild: Secondary | ICD-10-CM

## 2014-02-21 DIAGNOSIS — G20 Parkinson's disease: Secondary | ICD-10-CM

## 2014-02-21 DIAGNOSIS — F482 Pseudobulbar affect: Secondary | ICD-10-CM
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
## 2014-02-21 NOTE — Procedures (Signed)
DBS Programming was performed.    Total time spent programming was 30 minutes.  Device was confirmed to be on.  Soft start was confirmed to be on.  Impedences were checked and were within normal limits.  Battery was checked and was determined to be functioning normally and not near the end of life.  Final settings were as follows:  Left brain electrode:     1-C+           ; Amplitude  3.5   V   ; Pulse width 60 microseconds;   Frequency   160   Hz.  Right brain electrode:     5-C+          ; Amplitude   4.1  V ;  Pulse width 60  microseconds;  Frequency   160    Hz.

## 2014-03-01 ENCOUNTER — Telehealth: Payer: Self-pay | Admitting: Neurology

## 2014-03-01 NOTE — Telephone Encounter (Signed)
Spoke with [employer] [facility] and they would like to continue PT 2 times a week for 3 weeks. Approval given.

## 2014-05-29 ENCOUNTER — Encounter: Payer: Self-pay | Admitting: Neurology

## 2014-05-29 ENCOUNTER — Ambulatory Visit (INDEPENDENT_AMBULATORY_CARE_PROVIDER_SITE_OTHER): Payer: Medicare Other | Admitting: Neurology

## 2014-05-29 VITALS — BP 132/78 | HR 88

## 2014-05-29 DIAGNOSIS — G20 Parkinson's disease: Secondary | ICD-10-CM

## 2014-05-29 DIAGNOSIS — F330 Major depressive disorder, recurrent, mild: Secondary | ICD-10-CM

## 2014-05-29 DIAGNOSIS — F482 Pseudobulbar affect: Secondary | ICD-10-CM
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
## 2014-05-29 NOTE — Procedures (Signed)
DBS Programming was performed.   Detailed notes are no separate neurophysiologic worksheet.  Total time spent programming was 30 minutes.  Device was confirmed to be on.  Soft start was confirmed to be on.  Impedences were checked and were within normal limits.  Battery was checked and was determined to be functioning normally and not near the end of life.  Final settings were as follows:  Left brain electrode:     1-C+           ; Amplitude  3.5   V   ; Pulse width 60 microseconds;   Frequency   170   Hz.  Right brain electrode:     5-C+          ; Amplitude   4.2  V ;  Pulse width 60  microseconds;  Frequency   170    Hz.

## 2014-09-28 ENCOUNTER — Encounter: Payer: Self-pay | Admitting: Neurology

## 2014-09-28 ENCOUNTER — Ambulatory Visit (INDEPENDENT_AMBULATORY_CARE_PROVIDER_SITE_OTHER): Payer: Medicare Other | Admitting: Neurology

## 2014-09-28 VITALS — BP 152/70 | HR 68

## 2014-09-28 DIAGNOSIS — G20 Parkinson's disease: Secondary | ICD-10-CM

## 2014-09-28 DIAGNOSIS — F482 Pseudobulbar affect: Secondary | ICD-10-CM | POA: Diagnosis not present

## 2014-09-28 DIAGNOSIS — L309 Dermatitis, unspecified: Secondary | ICD-10-CM

## 2014-09-28 NOTE — Patient Instructions (Signed)
1. You can use CeraVe or Aquaphor for your skin.  2. We have referred you to [facility] for physical and speech therapy. They will contact you directly to set up care. They can be reached at [phone].
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
## 2014-09-28 NOTE — Procedures (Signed)
DBS Programming was performed.   Detailed notes are no separate neurophysiologic worksheet.  Total time spent programming was 20 minutes.  Device was confirmed to be on.  Soft start was confirmed to be on.  Impedences were checked and were within normal limits.  Battery was checked and was determined to be functioning normally and not near the end of life.  Final settings were as follows:  Left brain electrode:     1-C+           ; Amplitude  3.5   V   ; Pulse width 60 microseconds;   Frequency   160   Hz.  Right brain electrode:     5-C+          ; Amplitude   4.2  V ;  Pulse width 60  microseconds;  Frequency   160    Hz.

## 2015-02-01 ENCOUNTER — Ambulatory Visit: Payer: Self-pay | Admitting: Neurology

## 2015-02-15 ENCOUNTER — Ambulatory Visit: Payer: Self-pay | Admitting: Neurology

## 2015-02-23 ENCOUNTER — Encounter: Payer: Self-pay | Admitting: Neurology

## 2015-02-23 ENCOUNTER — Ambulatory Visit (INDEPENDENT_AMBULATORY_CARE_PROVIDER_SITE_OTHER): Payer: Medicare Other | Admitting: Neurology

## 2015-02-23 VITALS — BP 140/82 | HR 84

## 2015-02-23 DIAGNOSIS — G20A1 Parkinson's disease without dyskinesia, without mention of fluctuations: Secondary | ICD-10-CM | POA: Insufficient documentation

## 2015-02-23 DIAGNOSIS — G2581 Restless legs syndrome: Secondary | ICD-10-CM

## 2015-02-23 DIAGNOSIS — G20 Parkinson's disease: Secondary | ICD-10-CM | POA: Diagnosis not present

## 2015-02-23 NOTE — Procedures (Signed)
DBS Programming was performed.   Detailed notes are no separate neurophysiologic worksheet.  Total time spent programming was 20 minutes.  Device was confirmed to be on.  Soft start was confirmed to be on.  Impedences were checked and were within normal limits.  Battery was checked and was determined to be functioning normally and not near the end of life.  Final settings were as follows:  Left brain electrode:     1-C+           ; Amplitude  3.5   V   ; Pulse width 60 microseconds;   Frequency   150   Hz.  Right brain electrode:     5-C+          ; Amplitude   4.2  V ;  Pulse width 60  microseconds;  Frequency   150    Hz.
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
## 2015-03-15 ENCOUNTER — Telehealth: Payer: Self-pay | Admitting: Neurology

## 2015-03-15 NOTE — Telephone Encounter (Signed)
I have no appts tomorrow.  Any time next week for an extended DBS appt (45 min)

## 2015-03-15 NOTE — Telephone Encounter (Signed)
Pt called to inform that she is having a lot of shaking on lt side/lt arm/call back @[phone]

## 2015-03-15 NOTE — Telephone Encounter (Signed)
Appt made with patient for next week.

## 2015-03-15 NOTE — Telephone Encounter (Signed)
Spoke with patient. She states she has had increased tremor for awhile but it got significantly worse starting yesterday. She checked her device and it is turned on. Please advise.

## 2015-03-22 ENCOUNTER — Encounter: Payer: Self-pay | Admitting: Neurology

## 2015-03-22 ENCOUNTER — Ambulatory Visit (INDEPENDENT_AMBULATORY_CARE_PROVIDER_SITE_OTHER): Payer: Medicare Other | Admitting: Neurology

## 2015-03-22 VITALS — BP 144/86 | HR 78 | Ht 62.0 in | Wt 142.0 lb

## 2015-03-22 DIAGNOSIS — Z720 Tobacco use: Secondary | ICD-10-CM | POA: Diagnosis not present

## 2015-03-22 DIAGNOSIS — G20 Parkinson's disease: Secondary | ICD-10-CM

## 2015-03-22 NOTE — Addendum Note (Signed)
Addended by: Gerda Diss A on: 03/22/2015 03:04 PM   Modules accepted: Orders
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
## 2015-03-22 NOTE — Procedures (Signed)
DBS Programming was performed.   Detailed notes are no separate neurophysiologic worksheet.  Total time spent programming was 25 minutes.  Device was confirmed to be on.  Soft start was confirmed to be on.  Impedences were checked and were within normal limits.  Battery was checked and was determined to be functioning normally and not near the end of life.  Final settings were as follows:  Left brain electrode:     1-C+           ; Amplitude  3.5   V   ; Pulse width 60 microseconds;   Frequency   160   Hz.  Right brain electrode:     5-C+          ; Amplitude   4.3  V ;  Pulse width 60  microseconds;  Frequency   160    Hz.

## 2015-03-26 ENCOUNTER — Telehealth: Payer: Self-pay | Admitting: Neurology

## 2015-03-26 NOTE — Telephone Encounter (Signed)
Wendy/from speech therapy//[phone]//called for a nsg eval for a UTI

## 2015-03-26 NOTE — Telephone Encounter (Signed)
Left message on machine for Wendy to call back.   

## 2015-03-27 ENCOUNTER — Telehealth: Payer: Self-pay | Admitting: Neurology

## 2015-03-27 NOTE — Telephone Encounter (Signed)
If Abigail Butts calls back, to inform her if patient is having UTI symptoms she needs to follow up with her PCP.

## 2015-03-27 NOTE — Telephone Encounter (Signed)
Received report from [facility] that patient's clopidogrel interacts with esomeprazole magnesium. We do not prescribe either medication. Interaction noted.

## 2015-03-28 ENCOUNTER — Other Ambulatory Visit (HOSPITAL_COMMUNITY): Payer: Self-pay | Admitting: Neurology

## 2015-03-28 ENCOUNTER — Telehealth: Payer: Self-pay | Admitting: Neurology

## 2015-03-28 DIAGNOSIS — R1310 Dysphagia, unspecified: Secondary | ICD-10-CM

## 2015-03-28 DIAGNOSIS — R1319 Other dysphagia: Secondary | ICD-10-CM

## 2015-03-28 NOTE — Telephone Encounter (Signed)
Spoke with Abigail Butts - made her aware for UTI symptoms patient needs to see PCP.   She states patient is having trouble with choking on liquids. She is requested MBE. Okay to schedule?

## 2015-03-28 NOTE — Telephone Encounter (Signed)
Patient made aware of information below. 

## 2015-03-28 NOTE — Telephone Encounter (Signed)
A call back from speech therapist/nsg//to request answer to pt UTI tx/and a modified barium study/call back @ [phone]

## 2015-03-28 NOTE — Telephone Encounter (Signed)
We have scheduled you at [facility] for your modified barium swallow on 04/06/2015 at 1:00 pm. Please arrive 15 minutes prior and go to 1st floor radiology. If you need to reschedule for any reason please call [phone].   LMOM for patient to call back to give her the above message.

## 2015-03-28 NOTE — Telephone Encounter (Signed)
PT returned your call/Dawn CB# [phone]

## 2015-03-28 NOTE — Telephone Encounter (Signed)
yes

## 2015-03-28 NOTE — Telephone Encounter (Signed)
Left message on machine for Wendy to call back.   

## 2015-04-06 ENCOUNTER — Ambulatory Visit (HOSPITAL_COMMUNITY): Payer: Medicare Other

## 2015-04-06 ENCOUNTER — Ambulatory Visit (HOSPITAL_COMMUNITY): Admission: RE | Admit: 2015-04-06 | Payer: Medicare Other | Source: Ambulatory Visit

## 2015-04-11 ENCOUNTER — Other Ambulatory Visit (HOSPITAL_COMMUNITY): Payer: Self-pay | Admitting: Neurology

## 2015-04-11 DIAGNOSIS — R1310 Dysphagia, unspecified: Secondary | ICD-10-CM

## 2015-04-16 ENCOUNTER — Ambulatory Visit (HOSPITAL_COMMUNITY)
Admission: RE | Admit: 2015-04-16 | Discharge: 2015-04-16 | Disposition: A | Discharge status: Home / Self Care | Payer: Medicare Other | Source: Ambulatory Visit | Attending: Neurology | Admitting: Neurology

## 2015-04-16 DIAGNOSIS — R1310 Dysphagia, unspecified: Secondary | ICD-10-CM | POA: Diagnosis not present

## 2015-04-16 DIAGNOSIS — R1319 Other dysphagia: Secondary | ICD-10-CM

## 2015-04-17 ENCOUNTER — Telehealth: Payer: Self-pay | Admitting: Neurology

## 2015-05-11 NOTE — Telephone Encounter (Signed)
Error

## 2015-06-26 ENCOUNTER — Encounter: Payer: Self-pay | Admitting: Neurology

## 2015-06-26 ENCOUNTER — Ambulatory Visit (INDEPENDENT_AMBULATORY_CARE_PROVIDER_SITE_OTHER): Payer: Medicare Other | Admitting: Neurology

## 2015-06-26 VITALS — BP 130/80 | HR 64

## 2015-06-26 DIAGNOSIS — F482 Pseudobulbar affect: Secondary | ICD-10-CM | POA: Diagnosis not present

## 2015-06-26 DIAGNOSIS — G20 Parkinson's disease: Secondary | ICD-10-CM

## 2015-06-26 NOTE — Procedures (Signed)
DBS Programming was performed.   Detailed notes are no separate neurophysiologic worksheet.  Total time spent programming was 25 minutes.  Device was confirmed to be on.  Soft start was confirmed to be on.  Impedences were checked and were within normal limits.  Battery was checked and was determined to be functioning normally and not near the end of life (2.76).  Final settings were as follows:  Left brain electrode:     1-C+           ; Amplitude  3.5   V   ; Pulse width 60 microseconds;   Frequency   150   Hz.  Right brain electrode:     5-C+          ; Amplitude   4.3  V ;  Pulse width 60  microseconds;  Frequency   150    Hz.
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
## 2015-07-11 DIAGNOSIS — N3281 Overactive bladder: Secondary | ICD-10-CM | POA: Insufficient documentation

## 2015-10-25 ENCOUNTER — Ambulatory Visit: Payer: Self-pay | Admitting: Neurology

## 2015-11-13 ENCOUNTER — Telehealth: Payer: Self-pay | Admitting: Neurology

## 2015-11-13 ENCOUNTER — Encounter: Payer: Self-pay | Admitting: Neurology

## 2015-11-13 ENCOUNTER — Ambulatory Visit (INDEPENDENT_AMBULATORY_CARE_PROVIDER_SITE_OTHER): Payer: Medicare Other | Admitting: Neurology

## 2015-11-13 VITALS — BP 130/80 | HR 60

## 2015-11-13 DIAGNOSIS — F482 Pseudobulbar affect: Secondary | ICD-10-CM

## 2015-11-13 DIAGNOSIS — G20 Parkinson's disease: Secondary | ICD-10-CM | POA: Diagnosis not present

## 2015-11-13 DIAGNOSIS — Z9689 Presence of other specified functional implants: Secondary | ICD-10-CM
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
## 2015-11-13 NOTE — Telephone Encounter (Signed)
Referral faxed to [facility] at [phone] with confirmation received. They will contact the patient to schedule.  

## 2015-11-13 NOTE — Procedures (Signed)
DBS Programming was performed.   Detailed notes are no separate neurophysiologic worksheet.  Total time spent programming was 20 minutes.  Device was confirmed to be on.  Soft start was confirmed to be on.  Impedences were checked and were within normal limits.  Battery was checked and was determined to be at end of life (2.58).  Therefore, frequencies turned down.   Final settings were as follows:  Left brain electrode:     1-C+           ; Amplitude  3.5   V   ; Pulse width 60 microseconds;   Frequency   140   Hz.  Right brain electrode:     5-C+          ; Amplitude   4.3  V ;  Pulse width 60  microseconds;  Frequency   140    Hz.

## 2015-11-16 ENCOUNTER — Telehealth: Payer: Self-pay | Admitting: Neurology

## 2015-11-16 NOTE — Telephone Encounter (Signed)
We sent a message to Dr. Melven Sartorius nurse and he will check into this. Referral sent on Tuesday. Patient made aware.

## 2015-11-16 NOTE — Telephone Encounter (Signed)
Pt states that her batteries need to be changed in her DBS please call [phone]

## 2015-11-19 DIAGNOSIS — Z4542 Encounter for adjustment and management of neuropacemaker (brain) (peripheral nerve) (spinal cord): Secondary | ICD-10-CM | POA: Insufficient documentation

## 2015-11-20 ENCOUNTER — Other Ambulatory Visit: Payer: Self-pay | Admitting: Neurosurgery

## 2015-11-23 ENCOUNTER — Encounter (HOSPITAL_COMMUNITY): Payer: Self-pay | Admitting: *Deleted

## 2015-11-23 MED ORDER — CEFAZOLIN SODIUM-DEXTROSE 2-4 GM/100ML-% IV SOLN
2.0000 g | INTRAVENOUS | Status: AC
  Administered 2015-11-26: 2 g via INTRAVENOUS
  Filled 2015-11-23: qty 100

## 2015-11-26 ENCOUNTER — Ambulatory Visit (HOSPITAL_COMMUNITY): Payer: Medicare Other | Admitting: Anesthesiology

## 2015-11-26 ENCOUNTER — Encounter (HOSPITAL_COMMUNITY)
Admission: RE | Disposition: A | Discharge status: Home / Self Care | Payer: Self-pay | Source: Ambulatory Visit | Attending: Neurosurgery

## 2015-11-26 ENCOUNTER — Observation Stay (HOSPITAL_COMMUNITY)
Admission: RE | Admit: 2015-11-26 | Discharge: 2015-11-27 | Disposition: A | Discharge status: Home / Self Care | Payer: Medicare Other | Source: Ambulatory Visit | Attending: Neurosurgery | Admitting: Neurosurgery

## 2015-11-26 ENCOUNTER — Encounter (HOSPITAL_COMMUNITY): Payer: Self-pay | Admitting: *Deleted

## 2015-11-26 DIAGNOSIS — Z4542 Encounter for adjustment and management of neuropacemaker (brain) (peripheral nerve) (spinal cord): Secondary | ICD-10-CM | POA: Diagnosis present

## 2015-11-26 DIAGNOSIS — G20 Parkinson's disease: Secondary | ICD-10-CM | POA: Diagnosis not present

## 2015-11-26 DIAGNOSIS — G20A1 Parkinson's disease without dyskinesia, without mention of fluctuations: Secondary | ICD-10-CM | POA: Diagnosis present

## 2015-11-26 DIAGNOSIS — Z8673 Personal history of transient ischemic attack (TIA), and cerebral infarction without residual deficits: Secondary | ICD-10-CM | POA: Insufficient documentation

## 2015-11-26 DIAGNOSIS — Z7902 Long term (current) use of antithrombotics/antiplatelets: Secondary | ICD-10-CM | POA: Diagnosis not present

## 2015-11-26 DIAGNOSIS — Z23 Encounter for immunization: Secondary | ICD-10-CM | POA: Diagnosis not present

## 2015-11-26 DIAGNOSIS — Z79899 Other long term (current) drug therapy: Secondary | ICD-10-CM | POA: Insufficient documentation

## 2015-11-26 DIAGNOSIS — F418 Other specified anxiety disorders: Secondary | ICD-10-CM | POA: Insufficient documentation

## 2015-11-26 DIAGNOSIS — G4730 Sleep apnea, unspecified: Secondary | ICD-10-CM | POA: Diagnosis not present

## 2015-11-26 DIAGNOSIS — I10 Essential (primary) hypertension: Secondary | ICD-10-CM | POA: Diagnosis not present

## 2015-11-26 DIAGNOSIS — F17210 Nicotine dependence, cigarettes, uncomplicated: Secondary | ICD-10-CM | POA: Diagnosis not present

## 2015-11-26 DIAGNOSIS — K219 Gastro-esophageal reflux disease without esophagitis: Secondary | ICD-10-CM | POA: Diagnosis not present

## 2015-11-26 HISTORY — DX: Other specified postprocedural states: Z98.890

## 2015-11-26 HISTORY — DX: Adverse effect of unspecified anesthetic, initial encounter: T41.45XA

## 2015-11-26 HISTORY — DX: Nausea with vomiting, unspecified: R11.2

## 2015-11-26 HISTORY — DX: Urinary tract infection, site not specified: N39.0

## 2015-11-26 HISTORY — DX: Other complications of anesthesia, initial encounter: T88.59XA

## 2015-11-26 HISTORY — PX: SUBTHALAMIC STIMULATOR BATTERY REPLACEMENT: SHX5405

## 2015-11-26 LAB — CBC
HCT: 45.5 % (ref 36.0–46.0)
Hemoglobin: 15.1 g/dL — ABNORMAL HIGH (ref 12.0–15.0)
MCH: 31.9 pg (ref 26.0–34.0)
MCHC: 33.2 g/dL (ref 30.0–36.0)
MCV: 96.2 fL (ref 78.0–100.0)
Platelets: 176 10*3/uL (ref 150–400)
RBC: 4.73 MIL/uL (ref 3.87–5.11)
RDW: 12.7 % (ref 11.5–15.5)
WBC: 7.1 10*3/uL (ref 4.0–10.5)

## 2015-11-26 LAB — BASIC METABOLIC PANEL
Anion gap: 8 (ref 5–15)
BUN: 13 mg/dL (ref 6–20)
CO2: 25 mmol/L (ref 22–32)
Calcium: 9.5 mg/dL (ref 8.9–10.3)
Chloride: 104 mmol/L (ref 101–111)
Creatinine, Ser: 0.85 mg/dL (ref 0.44–1.00)
GFR calc Af Amer: >60 mL/min (ref >60)
GFR calc non Af Amer: >60 mL/min (ref >60)
Glucose, Bld: 106 mg/dL — ABNORMAL HIGH (ref 65–99)
Potassium: 4.1 mmol/L (ref 3.5–5.1)
Sodium: 137 mmol/L (ref 135–145)

## 2015-11-26 LAB — SURGICAL PCR SCREEN
MRSA, PCR: NEGATIVE
Staphylococcus aureus: NEGATIVE

## 2015-11-26 SURGERY — SUBTHALAMIC STIMULATOR BATTERY REPLACEMENT
Anesthesia: General

## 2015-11-26 MED ORDER — LIDOCAINE 2% (20 MG/ML) 5 ML SYRINGE
INTRAMUSCULAR | Status: DC | PRN
  Administered 2015-11-26: 60 mg via INTRAVENOUS

## 2015-11-26 MED ORDER — ALPRAZOLAM 0.5 MG PO TABS: 0.5000 mg | ORAL_TABLET | Freq: Two times a day (BID) | ORAL | Status: DC | PRN

## 2015-11-26 MED ORDER — METHOCARBAMOL 500 MG PO TABS
500.0000 mg | ORAL_TABLET | Freq: Four times a day (QID) | ORAL | Status: DC | PRN
  Administered 2015-11-26: 500 mg via ORAL

## 2015-11-26 MED ORDER — LACTATED RINGERS IV SOLN
INTRAVENOUS | Status: DC
  Administered 2015-11-26 (×3): via INTRAVENOUS

## 2015-11-26 MED ORDER — DOCUSATE SODIUM 100 MG PO CAPS
100.0000 mg | ORAL_CAPSULE | Freq: Two times a day (BID) | ORAL | Status: DC
  Administered 2015-11-26 – 2015-11-27 (×2): 100 mg via ORAL
  Filled 2015-11-26 (×2): qty 1

## 2015-11-26 MED ORDER — ERGOCALCIFEROL 1.25 MG (50000 UT) PO CAPS
50000.0000 [IU] | ORAL_CAPSULE | ORAL | Status: DC
  Administered 2015-11-26: 50000 [IU] via ORAL
  Filled 2015-11-26: qty 1

## 2015-11-26 MED ORDER — PRAMIPEXOLE DIHYDROCHLORIDE 1 MG PO TABS
1.0000 mg | ORAL_TABLET | Freq: Every day | ORAL | Status: DC
  Administered 2015-11-27: 1 mg via ORAL
  Filled 2015-11-26: qty 1

## 2015-11-26 MED ORDER — PANTOPRAZOLE SODIUM 40 MG PO TBEC
40.0000 mg | DELAYED_RELEASE_TABLET | Freq: Every day | ORAL | Status: DC
  Administered 2015-11-27: 40 mg via ORAL
  Filled 2015-11-26: qty 1

## 2015-11-26 MED ORDER — DEXTROSE 5 % IV SOLN
10.0000 mg | INTRAVENOUS | Status: DC | PRN
  Administered 2015-11-26: 25 ug/min via INTRAVENOUS

## 2015-11-26 MED ORDER — POLYETHYLENE GLYCOL 3350 17 G PO PACK: 17.0000 g | PACK | Freq: Every day | ORAL | Status: DC | PRN

## 2015-11-26 MED ORDER — PROPOFOL 10 MG/ML IV BOLUS
INTRAVENOUS | Status: AC
  Filled 2015-11-26: qty 20

## 2015-11-26 MED ORDER — FENTANYL CITRATE (PF) 250 MCG/5ML IJ SOLN
INTRAMUSCULAR | Status: AC
  Filled 2015-11-26: qty 5

## 2015-11-26 MED ORDER — LOSARTAN POTASSIUM 50 MG PO TABS
100.0000 mg | ORAL_TABLET | Freq: Every day | ORAL | Status: DC
  Administered 2015-11-27: 100 mg via ORAL
  Filled 2015-11-26: qty 2

## 2015-11-26 MED ORDER — METHOCARBAMOL 1000 MG/10ML IJ SOLN
500.0000 mg | Freq: Four times a day (QID) | INTRAMUSCULAR | Status: DC | PRN
  Filled 2015-11-26: qty 5

## 2015-11-26 MED ORDER — SODIUM CHLORIDE 0.9 % IV SOLN: 250.0000 mL | INTRAVENOUS | Status: DC

## 2015-11-26 MED ORDER — BACITRACIN ZINC 500 UNIT/GM EX OINT
TOPICAL_OINTMENT | CUTANEOUS | Status: DC | PRN
  Administered 2015-11-26: 1 via TOPICAL

## 2015-11-26 MED ORDER — PHENOL 1.4 % MT LIQD: 1.0000 | OROMUCOSAL | Status: DC | PRN

## 2015-11-26 MED ORDER — DULOXETINE HCL 60 MG PO CPEP
120.0000 mg | ORAL_CAPSULE | Freq: Every day | ORAL | Status: DC
  Administered 2015-11-27: 120 mg via ORAL
  Filled 2015-11-26 (×2): qty 2

## 2015-11-26 MED ORDER — HYDROCODONE-ACETAMINOPHEN 5-325 MG PO TABS
1.0000 | ORAL_TABLET | ORAL | Status: DC | PRN
  Administered 2015-11-26 (×2): 2 via ORAL
  Filled 2015-11-26: qty 2

## 2015-11-26 MED ORDER — BUPIVACAINE HCL (PF) 0.5 % IJ SOLN
INTRAMUSCULAR | Status: DC | PRN
  Administered 2015-11-26: 10 mL

## 2015-11-26 MED ORDER — FENTANYL 12 MCG/HR TD PT72
12.5000 ug | MEDICATED_PATCH | TRANSDERMAL | Status: DC
  Administered 2015-11-26: 12.5 ug via TRANSDERMAL
  Filled 2015-11-26: qty 1

## 2015-11-26 MED ORDER — MENTHOL 3 MG MT LOZG: 1.0000 | LOZENGE | OROMUCOSAL | Status: DC | PRN

## 2015-11-26 MED ORDER — AMLODIPINE BESYLATE 5 MG PO TABS
5.0000 mg | ORAL_TABLET | Freq: Every day | ORAL | Status: DC
  Administered 2015-11-27: 5 mg via ORAL
  Filled 2015-11-26: qty 1

## 2015-11-26 MED ORDER — CHLORHEXIDINE GLUCONATE CLOTH 2 % EX PADS: 6.0000 | MEDICATED_PAD | Freq: Once | CUTANEOUS | Status: DC

## 2015-11-26 MED ORDER — HYDROCODONE-ACETAMINOPHEN 10-325 MG PO TABS: 1.0000 | ORAL_TABLET | Freq: Four times a day (QID) | ORAL | Status: DC | PRN

## 2015-11-26 MED ORDER — 0.9 % SODIUM CHLORIDE (POUR BTL) OPTIME
TOPICAL | Status: DC | PRN
  Administered 2015-11-26: 1000 mL

## 2015-11-26 MED ORDER — HYDROMORPHONE HCL 1 MG/ML IJ SOLN: 0.5000 mg | INTRAMUSCULAR | Status: DC | PRN

## 2015-11-26 MED ORDER — FLEET ENEMA 7-19 GM/118ML RE ENEM: 1.0000 | ENEMA | Freq: Once | RECTAL | Status: DC | PRN

## 2015-11-26 MED ORDER — CEFAZOLIN IN D5W 1 GM/50ML IV SOLN
1.0000 g | Freq: Three times a day (TID) | INTRAVENOUS | Status: AC
  Administered 2015-11-26 (×2): 1 g via INTRAVENOUS
  Filled 2015-11-26 (×2): qty 50

## 2015-11-26 MED ORDER — MIDAZOLAM HCL 2 MG/2ML IJ SOLN
INTRAMUSCULAR | Status: AC
  Filled 2015-11-26: qty 2

## 2015-11-26 MED ORDER — ACETAMINOPHEN 650 MG RE SUPP: 650.0000 mg | RECTAL | Status: DC | PRN

## 2015-11-26 MED ORDER — VANCOMYCIN HCL 1000 MG IV SOLR
INTRAVENOUS | Status: DC | PRN
  Administered 2015-11-26: 1000 mg

## 2015-11-26 MED ORDER — DEXAMETHASONE SODIUM PHOSPHATE 10 MG/ML IJ SOLN
INTRAMUSCULAR | Status: DC | PRN
  Administered 2015-11-26: 4 mg via INTRAVENOUS

## 2015-11-26 MED ORDER — FENTANYL CITRATE (PF) 100 MCG/2ML IJ SOLN
25.0000 ug | INTRAMUSCULAR | Status: DC | PRN
  Administered 2015-11-26: 50 ug via INTRAVENOUS

## 2015-11-26 MED ORDER — DARIFENACIN HYDROBROMIDE ER 7.5 MG PO TB24
7.5000 mg | ORAL_TABLET | Freq: Every day | ORAL | Status: DC
  Administered 2015-11-26 – 2015-11-27 (×2): 7.5 mg via ORAL
  Filled 2015-11-26 (×2): qty 1

## 2015-11-26 MED ORDER — FAMOTIDINE IN NACL 20-0.9 MG/50ML-% IV SOLN
20.0000 mg | Freq: Two times a day (BID) | INTRAVENOUS | Status: DC
  Administered 2015-11-26: 20 mg via INTRAVENOUS
  Filled 2015-11-26 (×2): qty 50

## 2015-11-26 MED ORDER — PROPOFOL 10 MG/ML IV BOLUS
INTRAVENOUS | Status: DC | PRN
  Administered 2015-11-26: 50 mg via INTRAVENOUS
  Administered 2015-11-26: 150 mg via INTRAVENOUS

## 2015-11-26 MED ORDER — ALUM & MAG HYDROXIDE-SIMETH 200-200-20 MG/5ML PO SUSP: 30.0000 mL | Freq: Four times a day (QID) | ORAL | Status: DC | PRN

## 2015-11-26 MED ORDER — LINACLOTIDE 145 MCG PO CAPS
145.0000 ug | ORAL_CAPSULE | Freq: Every day | ORAL | Status: DC | PRN
  Administered 2015-11-27: 145 ug via ORAL
  Filled 2015-11-26 (×2): qty 1

## 2015-11-26 MED ORDER — PHENYLEPHRINE 40 MCG/ML (10ML) SYRINGE FOR IV PUSH (FOR BLOOD PRESSURE SUPPORT)
PREFILLED_SYRINGE | INTRAVENOUS | Status: DC | PRN
  Administered 2015-11-26 (×3): 80 ug via INTRAVENOUS

## 2015-11-26 MED ORDER — FENTANYL CITRATE (PF) 100 MCG/2ML IJ SOLN
INTRAMUSCULAR | Status: AC
  Filled 2015-11-26: qty 2

## 2015-11-26 MED ORDER — SODIUM CHLORIDE 0.9% FLUSH: 3.0000 mL | INTRAVENOUS | Status: DC | PRN

## 2015-11-26 MED ORDER — ONDANSETRON HCL 4 MG/2ML IJ SOLN: 4.0000 mg | INTRAMUSCULAR | Status: DC | PRN

## 2015-11-26 MED ORDER — BISACODYL 10 MG RE SUPP: 10.0000 mg | Freq: Every day | RECTAL | Status: DC | PRN

## 2015-11-26 MED ORDER — VANCOMYCIN HCL 1000 MG IV SOLR
INTRAVENOUS | Status: AC
  Filled 2015-11-26: qty 1000

## 2015-11-26 MED ORDER — EPHEDRINE SULFATE-NACL 50-0.9 MG/10ML-% IV SOSY
PREFILLED_SYRINGE | INTRAVENOUS | Status: DC | PRN
  Administered 2015-11-26 (×2): 5 mg via INTRAVENOUS

## 2015-11-26 MED ORDER — LIDOCAINE-EPINEPHRINE 1 %-1:100000 IJ SOLN
INTRAMUSCULAR | Status: DC | PRN
  Administered 2015-11-26: 10 mL

## 2015-11-26 MED ORDER — FENTANYL CITRATE (PF) 100 MCG/2ML IJ SOLN
INTRAMUSCULAR | Status: DC | PRN
  Administered 2015-11-26: 50 ug via INTRAVENOUS
  Administered 2015-11-26 (×2): 25 ug via INTRAVENOUS

## 2015-11-26 MED ORDER — MUPIROCIN 2 % EX OINT
1.0000 "application " | TOPICAL_OINTMENT | Freq: Once | CUTANEOUS | Status: AC
  Administered 2015-11-26: 1 via TOPICAL

## 2015-11-26 MED ORDER — ONDANSETRON HCL 4 MG/2ML IJ SOLN
INTRAMUSCULAR | Status: DC | PRN
  Administered 2015-11-26: 4 mg via INTRAVENOUS

## 2015-11-26 MED ORDER — DEXTROMETHORPHAN-QUINIDINE 20-10 MG PO CAPS
1.0000 | ORAL_CAPSULE | Freq: Two times a day (BID) | ORAL | Status: DC
  Administered 2015-11-26 – 2015-11-27 (×3): 1 via ORAL
  Filled 2015-11-26 (×3): qty 1

## 2015-11-26 MED ORDER — KCL IN DEXTROSE-NACL 20-5-0.45 MEQ/L-%-% IV SOLN: INTRAVENOUS | Status: DC

## 2015-11-26 MED ORDER — MUPIROCIN 2 % EX OINT
TOPICAL_OINTMENT | CUTANEOUS | Status: AC
  Filled 2015-11-26: qty 22

## 2015-11-26 MED ORDER — HYDROCODONE-ACETAMINOPHEN 5-325 MG PO TABS
ORAL_TABLET | ORAL | Status: AC
Start: 2015-11-26 — End: 2015-11-27
  Filled 2015-11-26: qty 2

## 2015-11-26 MED ORDER — METHOCARBAMOL 500 MG PO TABS
ORAL_TABLET | ORAL | Status: AC
  Filled 2015-11-26: qty 1

## 2015-11-26 MED ORDER — SODIUM CHLORIDE 0.9% FLUSH
3.0000 mL | Freq: Two times a day (BID) | INTRAVENOUS | Status: DC
  Administered 2015-11-27: 3 mL via INTRAVENOUS

## 2015-11-26 MED ORDER — PNEUMOCOCCAL VAC POLYVALENT 25 MCG/0.5ML IJ INJ
0.5000 mL | INJECTION | INTRAMUSCULAR | Status: AC
  Administered 2015-11-27: 0.5 mL via INTRAMUSCULAR
  Filled 2015-11-26: qty 0.5

## 2015-11-26 MED ORDER — ACETAMINOPHEN 325 MG PO TABS: 650.0000 mg | ORAL_TABLET | ORAL | Status: DC | PRN

## 2015-11-26 MED ORDER — MIDAZOLAM HCL 5 MG/5ML IJ SOLN
INTRAMUSCULAR | Status: DC | PRN
  Administered 2015-11-26: 1 mg via INTRAVENOUS

## 2015-11-26 SURGICAL SUPPLY — 41 items
CANISTER SUCT 3000ML PPV (MISCELLANEOUS) ×2 IMPLANT
DECANTER SPIKE VIAL GLASS SM (MISCELLANEOUS) ×2 IMPLANT
DERMABOND ADVANCED (GAUZE/BANDAGES/DRESSINGS) ×1
DERMABOND ADVANCED .7 DNX12 (GAUZE/BANDAGES/DRESSINGS) ×1 IMPLANT
DRAPE LAPAROTOMY 100X72 PEDS (DRAPES) ×2 IMPLANT
DRAPE POUCH INSTRU U-SHP 10X18 (DRAPES) ×2 IMPLANT
DRSG OPSITE POSTOP 4X6 (GAUZE/BANDAGES/DRESSINGS) IMPLANT
DRSG OPSITE POSTOP 4X8 (GAUZE/BANDAGES/DRESSINGS) ×2 IMPLANT
DURAPREP 26ML APPLICATOR (WOUND CARE) ×2 IMPLANT
GAUZE SPONGE 4X4 16PLY XRAY LF (GAUZE/BANDAGES/DRESSINGS) IMPLANT
GLOVE BIO SURGEON STRL SZ8 (GLOVE) ×6 IMPLANT
GLOVE BIOGEL PI IND STRL 8 (GLOVE) ×1 IMPLANT
GLOVE BIOGEL PI IND STRL 8.5 (GLOVE) ×1 IMPLANT
GLOVE BIOGEL PI INDICATOR 8 (GLOVE) ×1
GLOVE BIOGEL PI INDICATOR 8.5 (GLOVE) ×1
GLOVE ECLIPSE 7.5 STRL STRAW (GLOVE) ×4 IMPLANT
GLOVE ECLIPSE 8.0 STRL XLNG CF (GLOVE) ×2 IMPLANT
GLOVE EXAM NITRILE LRG STRL (GLOVE) IMPLANT
GLOVE EXAM NITRILE MD LF STRL (GLOVE) IMPLANT
GLOVE EXAM NITRILE XL STR (GLOVE) IMPLANT
GLOVE EXAM NITRILE XS STR PU (GLOVE) IMPLANT
GLOVE INDICATOR 7.0 STRL GRN (GLOVE) ×2 IMPLANT
GLOVE INDICATOR 8.0 STRL GRN (GLOVE) ×2 IMPLANT
GLOVE SURG SS PI 6.5 STRL IVOR (GLOVE) ×4 IMPLANT
GOWN STRL REUS W/ TWL LRG LVL3 (GOWN DISPOSABLE) ×1 IMPLANT
GOWN STRL REUS W/ TWL XL LVL3 (GOWN DISPOSABLE) ×1 IMPLANT
GOWN STRL REUS W/TWL 2XL LVL3 (GOWN DISPOSABLE) ×2 IMPLANT
GOWN STRL REUS W/TWL LRG LVL3 (GOWN DISPOSABLE) ×1
GOWN STRL REUS W/TWL XL LVL3 (GOWN DISPOSABLE) ×1
KIT BASIN OR (CUSTOM PROCEDURE TRAY) ×2 IMPLANT
KIT ROOM TURNOVER OR (KITS) ×2 IMPLANT
NEEDLE HYPO 25X1 1.5 SAFETY (NEEDLE) ×2 IMPLANT
NEUROSTIM OCTOPOLAR ~~LOC~~ 49X65 (Neuro Prosthesis/Implant) ×2 IMPLANT
NS IRRIG 1000ML POUR BTL (IV SOLUTION) ×2 IMPLANT
PACK LAMINECTOMY NEURO (CUSTOM PROCEDURE TRAY) ×2 IMPLANT
PAD ARMBOARD 7.5X6 YLW CONV (MISCELLANEOUS) ×6 IMPLANT
SUT VIC AB 2-0 CP2 18 (SUTURE) ×2 IMPLANT
SUT VIC AB 3-0 SH 8-18 (SUTURE) ×6 IMPLANT
TOWEL OR 17X24 6PK STRL BLUE (TOWEL DISPOSABLE) ×2 IMPLANT
TOWEL OR 17X26 10 PK STRL BLUE (TOWEL DISPOSABLE) ×2 IMPLANT
WATER STERILE IRR 1000ML POUR (IV SOLUTION) ×2 IMPLANT

## 2015-11-26 NOTE — Anesthesia Procedure Notes (Signed)
Procedure Name: LMA Insertion Date/Time: 11/26/2015 11:33 AM Performed by: Bethel Born Pre-anesthesia Checklist: Patient identified, Emergency Drugs available, Suction available, Patient being monitored and Timeout performed Patient Re-evaluated:Patient Re-evaluated prior to inductionOxygen Delivery Method: Circle system utilized Preoxygenation: Pre-oxygenation with 100% oxygen Intubation Type: IV induction Ventilation: Mask ventilation without difficulty LMA: LMA inserted LMA Size: 4.0 Number of attempts: 1 Tube secured with: Tape Dental Injury: Teeth and Oropharynx as per pre-operative assessment

## 2015-11-26 NOTE — Transfer of Care (Signed)
Immediate Anesthesia Transfer of Care Note  Patient: Courtney Tucker  Procedure(s) Performed: Procedure(s): Implantable pulse generator battery change for deep brain stimulator (N/A)  Patient Location: PACU  Anesthesia Type:General  Level of Consciousness: awake, alert , oriented and patient cooperative  Airway & Oxygen Therapy: Patient Spontanous Breathing and Patient connected to nasal cannula oxygen  Post-op Assessment: Report given to RN and Post -op Vital signs reviewed and stable  Post vital signs: Reviewed and stable  Last Vitals:  Filed Vitals:   11/26/15 0700 11/26/15 1227  BP: 167/65   Pulse: 67   Temp: 36.5 C 36.8 C  Resp: 18     Last Pain:  Filed Vitals:   11/26/15 1228  PainSc: 3       Patients Stated Pain Goal: 2 ([id number])  Complications: No apparent anesthesia complications

## 2015-11-26 NOTE — H&P (Signed)
  Patient ID:   [id number] Patient: Courtney Tucker  Date of Birth: 04/21/1946 Visit Type: Office Visit   Date: 11/19/2015 02:00 PM Provider: Marchia Meiers. Vertell Limber MD   This 70 year old female presents for Parkinson's Disease.  History of Present Illness: 1.  Parkinson's Disease    Coby Bartoli returns to discuss replacement of her deep brain stimulator implanted pulse generator.  She has followed with Dr. Wells Guiles Tat for adjustments, who found her IPG to be "end-of-life" at her appointment last week.  Of note, she remains on Plavix following her stroke.        PAST MEDICAL/SURGICAL HISTORY   (Detailed)     PAST MEDICAL HISTORY, SURGICAL HISTORY, FAMILY HISTORY, SOCIAL HISTORY AND REVIEW OF SYSTEMS  11/19/2015, which I have signed.  Family History  (Detailed)   SOCIAL HISTORY  (Detailed) Tobacco use reviewed. Preferred language is Unknown.   Smoking status: Current every day smoker.  SMOKING STATUS Use Status Type Smoking Status Usage Per Day Years Used Total Pack Years  yes Cigarette Current every day smoker      TOBACCO CESSATION INFORMATION Date Order Status Description Code Tobacco Cessation Information  11/19/2015     Smoking cessation education    HOME ENVIRONMENT/SAFETY The patient has not fallen in the last year.        MEDICATIONS(added, continued or stopped this visit):   ALLERGIES:    Vitals Date Temp F BP Pulse Ht In Wt Lb BMI BSA Pain Score  11/19/2015  143/81 84 62 156 28.53  3/10      IMPRESSION The patient needs to have her IPG replaced. She will need to be off her plavix for one week before surgery.  Patient is on an anti-coagulant, anti-inflammatory or supplement that may increase bleeding time. Patient advised to stop medicine prior to surgery.  Comments:  Patient will stop Plavix now in preparation for her July 3 surgery.  Assessment/Plan # Detail Type Description   1. Assessment Parkinsons disease (G20).       2.  Assessment End of battery life of deep brain stimulator (Z46.2).         Pain Assessment/Treatment Pain Scale: 3/10. Method: Numeric Pain Intensity Scale. Location: head. Duration: varies. Quality: discomforting. Pain Assessment/Treatment follow-up plan of care: Patient is taking medications prescribed.  Fall Risk Plan The patient has not fallen in the last year.  Schedule IPG replacement for 7/3. She will come off her plavix today.              Provider:  Marchia Meiers. Vertell Limber MD  11/19/2015 03:08 PM Dictation edited by: Johnella Moloney    CC Providers: [place],  [place]  [id number]   [facility] [place], [place] [address]-              Electronically signed by Marchia Meiers. Vertell Limber MD on 11/20/2015 01:51 PM

## 2015-11-26 NOTE — Interval H&P Note (Signed)
History and Physical Interval Note:  11/26/2015 9:18 AM  Courtney Tucker  has presented today for surgery, with the diagnosis of end of battery life for deep brain stimulator  The various methods of treatment have been discussed with the patient and family. After consideration of risks, benefits and other options for treatment, the patient has consented to  Procedure(s) with comments: Implantable pulse generator battery change for deep brain stimulator (N/A) - Implantable pulse generator battery change for deep brain stimulator as a surgical intervention .  The patient's history has been reviewed, patient examined, no change in status, stable for surgery.  I have reviewed the patient's chart and labs.  Questions were answered to the patient's satisfaction.     STERN,JOSEPH D

## 2015-11-26 NOTE — Progress Notes (Signed)
Awake, alert, conversant.  MAEW.  Doing well following IPG exchange.

## 2015-11-26 NOTE — Brief Op Note (Signed)
11/26/2015  12:42 PM  PATIENT:  Courtney Tucker  70 y.o. female  PRE-OPERATIVE DIAGNOSIS:  end of battery life for deep brain stimulator  POST-OPERATIVE DIAGNOSIS:  end of battery life for deep brain stimulator  PROCEDURE:  Procedure(s): Implantable pulse generator battery change for deep brain stimulator (N/A)  SURGEON:  Surgeon(s) and Role:    * Erline Levine, MD - Primary  PHYSICIAN ASSISTANT:   ASSISTANTS: none   ANESTHESIA:   general  EBL:  Total I/O In: 1050 [I.V.:1050] Out: 10 [Blood:10]  BLOOD ADMINISTERED:none  DRAINS: none   LOCAL MEDICATIONS USED:  LIDOCAINE   SPECIMEN:  No Specimen  DISPOSITION OF SPECIMEN:  N/A  COUNTS:  YES  TOURNIQUET:  * No tourniquets in log *  DICTATION: Patient has implanted subthalamic stimulator electrodes and IPG, which is now depleted.  It was elected for patient to undergo IPG revision.  PROCEDURE: Patient was brought to the operating room and given intravenous sedation.  Right upper chest was prepped with betadine scrub and Duraprep.  Area of planned incision was infiltrated with lidocaine.  Prior incision was reopened and the old IPG was externalized.  Adaptor was connected to new IPG which was placed in the pocket.  Wound was irrigated with vancomycin irrigation. Then irrigated once more.  Incision was closed with 2-0 Vicryl and 3-0 vicryl sutures and dressed with a sterile occlusive dressing.  Counts were correct at the end of the case.  PLAN OF CARE: Admit for overnight observation  PATIENT DISPOSITION:  PACU - hemodynamically stable.   Delay start of Pharmacological VTE agent (>24hrs) due to surgical blood loss or risk of bleeding: yes

## 2015-11-26 NOTE — Anesthesia Postprocedure Evaluation (Signed)
Anesthesia Post Note  Patient: TERIKA NISKANEN  Procedure(s) Performed: Procedure(s) (LRB): Implantable pulse generator battery change for deep brain stimulator (N/A)  Patient location during evaluation: PACU Anesthesia Type: General Level of consciousness: awake and alert Pain management: pain level controlled Vital Signs Assessment: post-procedure vital signs reviewed and stable Respiratory status: spontaneous breathing, nonlabored ventilation, respiratory function stable and patient connected to nasal cannula oxygen Cardiovascular status: blood pressure returned to baseline and stable Postop Assessment: no signs of nausea or vomiting Anesthetic complications: no    Last Vitals:  Filed Vitals:   11/26/15 1245 11/26/15 1300  BP: 154/89 150/73  Pulse: 98 94  Temp:    Resp: 18 21    Last Pain:  Filed Vitals:   11/26/15 1302  PainSc: 2     LLE Motor Response: Purposeful movement (11/26/15 1300) LLE Sensation: Full sensation (11/26/15 1300) RLE Motor Response: Purposeful movement (11/26/15 1300) RLE Sensation: Full sensation (11/26/15 1300)      FITZGERALD,W. EDMOND

## 2015-11-26 NOTE — Anesthesia Preprocedure Evaluation (Signed)
Anesthesia Evaluation  Patient identified by MRN, date of birth, ID band Patient awake    Reviewed: Allergy & Precautions, H&P , NPO status , Patient's Chart, lab work & pertinent test results  History of Anesthesia Complications (+) PONV  Airway Mallampati: II  TM Distance: >3 FB Neck ROM: Full    Dental no notable dental hx. (+) Teeth Intact, Dental Advisory Given   Pulmonary sleep apnea , Current Smoker,    Pulmonary exam normal breath sounds clear to auscultation       Cardiovascular hypertension, Pt. on medications  Rhythm:Regular Rate:Normal     Neuro/Psych Anxiety Depression Parkinson's Dz TIA   GI/Hepatic Neg liver ROS, GERD  Medicated and Controlled,  Endo/Other  negative endocrine ROS  Renal/GU negative Renal ROS  negative genitourinary   Musculoskeletal  (+) Arthritis , Osteoarthritis,    Abdominal   Peds  Hematology negative hematology ROS (+)   Anesthesia Other Findings   Reproductive/Obstetrics negative OB ROS                             Anesthesia Physical Anesthesia Plan  ASA: III  Anesthesia Plan: General   Post-op Pain Management:    Induction: Intravenous  Airway Management Planned: LMA and Oral ETT  Additional Equipment:   Intra-op Plan:   Post-operative Plan: Extubation in OR  Informed Consent: I have reviewed the patients History and Physical, chart, labs and discussed the procedure including the risks, benefits and alternatives for the proposed anesthesia with the patient or authorized representative who has indicated his/her understanding and acceptance.   Dental advisory given  Plan Discussed with: CRNA  Anesthesia Plan Comments:         Anesthesia Quick Evaluation

## 2015-11-26 NOTE — Op Note (Signed)
11/26/2015  12:42 PM  PATIENT:  Courtney Tucker  70 y.o. female  PRE-OPERATIVE DIAGNOSIS:  end of battery life for deep brain stimulator  POST-OPERATIVE DIAGNOSIS:  end of battery life for deep brain stimulator  PROCEDURE:  Procedure(s): Implantable pulse generator battery change for deep brain stimulator (N/A)  SURGEON:  Surgeon(s) and Role:    * Erline Levine, MD - Primary  PHYSICIAN ASSISTANT:   ASSISTANTS: none   ANESTHESIA:   general  EBL:  Total I/O In: 1050 [I.V.:1050] Out: 10 [Blood:10]  BLOOD ADMINISTERED:none  DRAINS: none   LOCAL MEDICATIONS USED:  LIDOCAINE   SPECIMEN:  No Specimen  DISPOSITION OF SPECIMEN:  N/A  COUNTS:  YES  TOURNIQUET:  * No tourniquets in log *  DICTATION: Patient has implanted subthalamic stimulator electrodes and IPG, which is now depleted.  It was elected for patient to undergo IPG revision.  PROCEDURE: Patient was brought to the operating room and given intravenous sedation.  Right upper chest was prepped with betadine scrub and Duraprep.  Area of planned incision was infiltrated with lidocaine.  Prior incision was reopened and the old IPG was externalized.  Adaptor was connected to new IPG which was placed in the pocket.  Wound was irrigated with vancomycin irrigation. Then irrigated once more.  Incision was closed with 2-0 Vicryl and 3-0 vicryl sutures and dressed with a sterile occlusive dressing.  Counts were correct at the end of the case.  PLAN OF CARE: Admit for overnight observation  PATIENT DISPOSITION:  PACU - hemodynamically stable.   Delay start of Pharmacological VTE agent (>24hrs) due to surgical blood loss or risk of bleeding: yes

## 2015-11-27 DIAGNOSIS — Z4542 Encounter for adjustment and management of neuropacemaker (brain) (peripheral nerve) (spinal cord): Secondary | ICD-10-CM | POA: Diagnosis not present

## 2015-11-27 NOTE — Discharge Instructions (Signed)
You may remove the dressing tomorrow. You may shower, simply pat the wound dry afterwards. Resume your normal diet.  Call if your temperature is greater than 101.5, if the wound drains, if the wound becomes very red or tender.

## 2015-11-27 NOTE — Care Management Note (Signed)
Case Management Note  Patient Details  Name: Courtney Tucker MRN: [id number] Date of Birth: 1946/04/21  Subjective/Objective:                    Action/Plan: Pt discharging home with self care. No further needs per CM.   Expected Discharge Date:                  Expected Discharge Plan:  Home/Self Care  In-House Referral:     Discharge planning Services     Post Acute Care Choice:    Choice offered to:     DME Arranged:    DME Agency:     HH Arranged:    [place] Agency:     Status of Service:  Completed, signed off  If discussed at [employer] of Stay Meetings, dates discussed:    Additional Comments:  Pollie Friar, RN 11/27/2015, 9:49 AM

## 2015-11-27 NOTE — Discharge Summary (Signed)
  Physician Discharge Summary  Patient ID: Courtney Tucker MRN: [id number] DOB/AGE: March 10, 1946 70 y.o.  Admit date: 11/26/2015 Discharge date: 11/27/2015  Admission Diagnoses:Parkinson's disease  Discharge Diagnoses:  Active Problems:   Parkinson's disease [facility])   Discharged Condition: good  Hospital Course: Courtney Tucker was admitted and taken to the OR for a battery change for her DBS. She is doing well, ambulating, voiding, and tolerating a regular diet. She will be discharged home today. The wound is clean, dry, and without signs of infection.   Treatments: surgery: Implantable pulse generator battery change for deep brain stimulator (N/A)   Discharge Exam: Blood pressure 121/62, pulse 82, temperature 98.8 F (37.1 C), temperature source Oral, resp. rate 18, height 5\' 2"  (1.575 m), weight 69.854 kg (154 lb), SpO2 99 %. General appearance: alert, cooperative, appears stated age and no distress Neurologic: Grossly normal at her neurologic baseline.  Disposition: 01-Home or Self Care end of battery life for deep brain stimulator    Medication List    TAKE these medications        ALPRAZolam 0.5 MG tablet  Commonly known as:  XANAX  Take 0.5 mg by mouth 2 (two) times daily as needed for anxiety.     amLODipine 10 MG tablet  Commonly known as:  NORVASC  Take 5 mg by mouth daily.     clopidogrel 75 MG tablet  Commonly known as:  PLAVIX  Take 75 mg by mouth daily.     DULoxetine 60 MG capsule  Commonly known as:  CYMBALTA  Take 120 mg by mouth daily.     ergocalciferol 50000 units capsule  Commonly known as:  VITAMIN D2  Take 50,000 Units by mouth 2 (two) times a week.     esomeprazole 40 MG capsule  Commonly known as:  NEXIUM  Take 40 mg by mouth daily before breakfast.     fentaNYL 12 MCG/HR  Commonly known as:  DURAGESIC - dosed mcg/hr  Place 12.5 mcg onto the skin every 3 (three) days.     HYDROcodone-acetaminophen 10-325 MG tablet  Commonly known as:   NORCO  Take 1 tablet by mouth every 6 (six) hours as needed for moderate pain.     LINZESS 145 MCG Caps capsule  Generic drug:  linaclotide  Take 145 mcg by mouth daily as needed (for stomach).     losartan 100 MG tablet  Commonly known as:  COZAAR  Take 100 mg by mouth daily.     NUEDEXTA 20-10 MG Caps  Generic drug:  Dextromethorphan-Quinidine  Take 1 tablet by mouth 2 (two) times daily.     OVER THE COUNTER MEDICATION  Apply 1 application topically 2 (two) times daily as needed (for pain). "[place] Gel"     pramipexole 1 MG tablet  Commonly known as:  MIRAPEX  Take 1 mg by mouth daily.     solifenacin 10 MG tablet  Commonly known as:  VESICARE  Take 10 mg by mouth daily.           Follow-up Information    Follow up with STERN,JOSEPH D, MD In 2 weeks.   Specialty:  Neurosurgery   Why:  Please call the office to make an appointment   Contact information:   [address] [place] [address] [place] [address] [phone]       Signed: Winfield Cunas 11/27/2015, 7:38 AM

## 2015-11-27 NOTE — Progress Notes (Signed)
Pt for discharge home today. Discharge orders received. IV and telemetry dcd with dressing clean dry and intact to lower back. Discharge instructions and prescriptions given with verbalized understanding. Family at bedside to assist with discharge. Staff brought patient to lobby via wheelchair at this time. Transported to home by family member.

## 2015-11-28 ENCOUNTER — Encounter (HOSPITAL_COMMUNITY): Payer: Self-pay | Admitting: Neurosurgery

## 2015-12-04 ENCOUNTER — Telehealth: Payer: Self-pay | Admitting: Neurology

## 2015-12-04 NOTE — Telephone Encounter (Signed)
Received call from [place], [place] with [name], to continue therapy 2 times a week for 6 weeks. [phone].
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
## 2016-02-11 ENCOUNTER — Ambulatory Visit: Payer: Self-pay | Admitting: Neurology
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
## 2016-02-19 ENCOUNTER — Ambulatory Visit (INDEPENDENT_AMBULATORY_CARE_PROVIDER_SITE_OTHER): Payer: Medicare Other | Admitting: Neurology

## 2016-02-19 ENCOUNTER — Encounter: Payer: Self-pay | Admitting: Neurology

## 2016-02-19 VITALS — BP 114/60 | HR 62

## 2016-02-19 DIAGNOSIS — Z720 Tobacco use: Secondary | ICD-10-CM | POA: Diagnosis not present

## 2016-02-19 DIAGNOSIS — Z9689 Presence of other specified functional implants: Secondary | ICD-10-CM

## 2016-02-19 DIAGNOSIS — G20 Parkinson's disease: Secondary | ICD-10-CM

## 2016-02-19 NOTE — Procedures (Signed)
DBS Programming was performed.   Detailed notes are no separate neurophysiologic worksheet.  Total time spent programming was 20 minutes.  Device was confirmed to be on.  Soft start was confirmed to be on.  Impedences were checked and were within normal limits.  Battery was checked and was determined to be at end of life (2.94).    Final settings were as follows:  Left brain electrode:     1-C+           ; Amplitude  3.5   V   ; Pulse width 60 microseconds;   Frequency   150   Hz.  Right brain electrode:     5-C+          ; Amplitude   4.3  V ;  Pulse width 60  microseconds;  Frequency   150    Hz.

## 2016-05-05 ENCOUNTER — Telehealth: Payer: Self-pay | Admitting: Neurology

## 2016-05-05 NOTE — Telephone Encounter (Signed)
-----   Message from Elenora Fender sent at 05/05/2016 11:05 AM EST ----- Regarding: Mignon (PT) from [facility] called regarding Rudie Javier [birth year]. He said that she was unavailable for start of care on Sunday 05/04/16, so it has been rescheduled for start of care on 05/05/16. Thank you

## 2016-05-06 ENCOUNTER — Telehealth: Payer: Self-pay | Admitting: Neurology

## 2016-05-06 NOTE — Telephone Encounter (Signed)
Courtney with [name] called to let us know patient would see PT, OT, ST- 2 times weekly for 6 weeks.

## 2016-05-15 ENCOUNTER — Telehealth: Payer: Self-pay | Admitting: Neurology

## 2016-05-15 NOTE — Telephone Encounter (Signed)
[name] called and said PT had to miss two visits this week due to her being sick/Courtney Tucker CB# [phone]
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
## 2016-06-20 ENCOUNTER — Ambulatory Visit: Payer: Self-pay | Admitting: Neurology

## 2016-06-20 DIAGNOSIS — Z029 Encounter for administrative examinations, unspecified: Secondary | ICD-10-CM

## 2016-06-23 ENCOUNTER — Encounter: Payer: Self-pay | Admitting: Neurology

## 2016-07-08 ENCOUNTER — Emergency Department (HOSPITAL_COMMUNITY)
Admission: EM | Admit: 2016-07-08 | Discharge: 2016-07-09 | Disposition: A | Discharge status: Home / Self Care | Payer: Medicare Other | Attending: Emergency Medicine | Admitting: Emergency Medicine

## 2016-07-08 ENCOUNTER — Emergency Department (HOSPITAL_COMMUNITY): Payer: Medicare Other

## 2016-07-08 ENCOUNTER — Encounter (HOSPITAL_COMMUNITY): Payer: Self-pay | Admitting: *Deleted

## 2016-07-08 DIAGNOSIS — G20 Parkinson's disease: Secondary | ICD-10-CM | POA: Diagnosis not present

## 2016-07-08 DIAGNOSIS — F17210 Nicotine dependence, cigarettes, uncomplicated: Secondary | ICD-10-CM | POA: Diagnosis not present

## 2016-07-08 DIAGNOSIS — R45851 Suicidal ideations: Secondary | ICD-10-CM | POA: Diagnosis not present

## 2016-07-08 DIAGNOSIS — M546 Pain in thoracic spine: Secondary | ICD-10-CM | POA: Diagnosis not present

## 2016-07-08 DIAGNOSIS — M545 Low back pain: Secondary | ICD-10-CM | POA: Insufficient documentation

## 2016-07-08 DIAGNOSIS — B9789 Other viral agents as the cause of diseases classified elsewhere: Secondary | ICD-10-CM

## 2016-07-08 DIAGNOSIS — Z79899 Other long term (current) drug therapy: Secondary | ICD-10-CM | POA: Insufficient documentation

## 2016-07-08 DIAGNOSIS — J069 Acute upper respiratory infection, unspecified: Secondary | ICD-10-CM | POA: Diagnosis not present

## 2016-07-08 DIAGNOSIS — R05 Cough: Secondary | ICD-10-CM | POA: Diagnosis present

## 2016-07-08 DIAGNOSIS — I10 Essential (primary) hypertension: Secondary | ICD-10-CM | POA: Insufficient documentation

## 2016-07-08 LAB — CBC WITH DIFFERENTIAL/PLATELET
Basophils Absolute: 0 10*3/uL (ref 0.0–0.1)
Basophils Relative: 1 %
Eosinophils Absolute: 0 10*3/uL (ref 0.0–0.7)
Eosinophils Relative: 0 %
HCT: 41.3 % (ref 36.0–46.0)
Hemoglobin: 14.5 g/dL (ref 12.0–15.0)
Lymphocytes Relative: 24 %
Lymphs Abs: 1 10*3/uL (ref 0.7–4.0)
MCH: 32.4 pg (ref 26.0–34.0)
MCHC: 35.1 g/dL (ref 30.0–36.0)
MCV: 92.4 fL (ref 78.0–100.0)
Monocytes Absolute: 0.6 10*3/uL (ref 0.1–1.0)
Monocytes Relative: 15 %
Neutro Abs: 2.5 10*3/uL (ref 1.7–7.7)
Neutrophils Relative %: 60 %
Platelets: 149 10*3/uL — ABNORMAL LOW (ref 150–400)
RBC: 4.47 MIL/uL (ref 3.87–5.11)
RDW: 12.1 % (ref 11.5–15.5)
WBC: 4.1 10*3/uL (ref 4.0–10.5)

## 2016-07-08 LAB — BASIC METABOLIC PANEL
Anion gap: 7 (ref 5–15)
BUN: 12 mg/dL (ref 6–20)
CO2: 26 mmol/L (ref 22–32)
Calcium: 8.8 mg/dL — ABNORMAL LOW (ref 8.9–10.3)
Chloride: 103 mmol/L (ref 101–111)
Creatinine, Ser: 0.8 mg/dL (ref 0.44–1.00)
GFR calc Af Amer: >60 mL/min (ref >60)
GFR calc non Af Amer: >60 mL/min (ref >60)
Glucose, Bld: 114 mg/dL — ABNORMAL HIGH (ref 65–99)
Potassium: 3.3 mmol/L — ABNORMAL LOW (ref 3.5–5.1)
Sodium: 136 mmol/L (ref 135–145)

## 2016-07-08 LAB — RAPID STREP SCREEN (MED CTR MEBANE ONLY): Streptococcus, Group A Screen (Direct): NEGATIVE

## 2016-07-08 MED ORDER — ONDANSETRON HCL 4 MG PO TABS: 4.0000 mg | ORAL_TABLET | Freq: Three times a day (TID) | ORAL | Status: DC | PRN

## 2016-07-08 MED ORDER — DEXTROMETHORPHAN-QUINIDINE 20-10 MG PO CAPS: 1.0000 | ORAL_CAPSULE | Freq: Two times a day (BID) | ORAL | Status: DC

## 2016-07-08 MED ORDER — LOSARTAN POTASSIUM 50 MG PO TABS
100.0000 mg | ORAL_TABLET | Freq: Every day | ORAL | Status: DC
  Filled 2016-07-08 (×3): qty 2

## 2016-07-08 MED ORDER — PRAMIPEXOLE DIHYDROCHLORIDE 0.25 MG PO TABS
0.5000 mg | ORAL_TABLET | Freq: Every day | ORAL | Status: DC
  Administered 2016-07-08: 0.5 mg via ORAL
  Filled 2016-07-08 (×3): qty 2

## 2016-07-08 MED ORDER — LINACLOTIDE 145 MCG PO CAPS
145.0000 ug | ORAL_CAPSULE | Freq: Every day | ORAL | Status: DC | PRN
  Filled 2016-07-08: qty 1

## 2016-07-08 MED ORDER — PANTOPRAZOLE SODIUM 40 MG PO TBEC: 40.0000 mg | DELAYED_RELEASE_TABLET | Freq: Every day | ORAL | Status: DC

## 2016-07-08 MED ORDER — CLOPIDOGREL BISULFATE 75 MG PO TABS
75.0000 mg | ORAL_TABLET | Freq: Every day | ORAL | Status: DC
Start: 2016-07-08 — End: 2016-07-09
  Administered 2016-07-08: 75 mg via ORAL
  Filled 2016-07-08: qty 1

## 2016-07-08 MED ORDER — DULOXETINE HCL 30 MG PO CPEP
120.0000 mg | ORAL_CAPSULE | Freq: Every day | ORAL | Status: DC
  Administered 2016-07-08: 120 mg via ORAL
  Filled 2016-07-08: qty 4

## 2016-07-08 MED ORDER — ALPRAZOLAM 0.5 MG PO TABS: 0.5000 mg | ORAL_TABLET | Freq: Three times a day (TID) | ORAL | Status: DC | PRN

## 2016-07-08 MED ORDER — ACETAMINOPHEN 325 MG PO TABS: 650.0000 mg | ORAL_TABLET | ORAL | Status: DC | PRN

## 2016-07-08 MED ORDER — SODIUM CHLORIDE 0.9 % IV BOLUS (SEPSIS)
1000.0000 mL | Freq: Once | INTRAVENOUS | Status: AC
  Administered 2016-07-08: 1000 mL via INTRAVENOUS

## 2016-07-08 MED ORDER — ALUM & MAG HYDROXIDE-SIMETH 200-200-20 MG/5ML PO SUSP: 30.0000 mL | ORAL | Status: DC | PRN

## 2016-07-08 MED ORDER — AMLODIPINE BESYLATE 5 MG PO TABS: 10.0000 mg | ORAL_TABLET | Freq: Every day | ORAL | Status: DC

## 2016-07-08 NOTE — ED Notes (Signed)
Patient transported to X-ray 

## 2016-07-08 NOTE — ED Provider Notes (Signed)
[facility] DEPT Provider Note   CSN: [id number] Arrival date & time: 07/08/16  1222  By signing my name below, I, Neta Mends, attest that this documentation has been prepared under the direction and in the presence of Forde Dandy, MD . Electronically Signed: Neta Mends, ED Scribe. 07/08/2016. 1:16 PM.    History   Chief Complaint Chief Complaint  Patient presents with  . Back Pain  . Sore Throat   The history is provided by the patient. No language interpreter was used.   HPI Comments:  ZIQI REIMER is a 71 y.o. female who presents to the Emergency Department complaining of a persistent cough x 6 days. Pt complains of associated cough, back pain, ear pain, headache, sore throat, congestion, rhinorrhea. Pt was seen at [facility] ED 5 days ago for nausea and vomiting which has since resolved. Her URI symptoms came on after returning from [facility] ED.  Pt states that when she coughs all of her symptoms worsen. Pt reports sick contact with sister who has similar symptoms. No alleviating factors noted for current symptoms. Pt denies fever, diarrhea.   Past Medical History:  Diagnosis Date  . Anxiety   . Arthritis   . Complication of anesthesia   . Depression   . Dysphasia   . GERD (gastroesophageal reflux disease)   . Hyperlipidemia   . Hypertension   . Parkinson's disease ([place])    diagnosed at age 6  . PONV (postoperative nausea and vomiting)    "last time"  . Reflux   . Sleep apnea    Has CPAP but doesnt use  . TIA (transient ischemic attack)    not really TIA but abnormal MRI per pt left sided weakness  . UTI (lower urinary tract infection)     Patient Active Problem List   Diagnosis Date Noted  . 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 Parkinson's disease ([place]) 02/23/2015  . Encounter for screening colonoscopy 11/14/2013  . Dysphagia, unspecified(787.20) 11/14/2013  . Parkinson disease ([place]) 08/05/2013  . Depression, major  07/25/2013  . Anxiety 12/30/2012  . Paralysis agitans ([place]) 10/15/2012  . 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 Abnormality of gait 10/15/2012  . Dysphasia 10/15/2012  . HYPERLIPIDEMIA-MIXED 02/14/2009  . HYPERTENSION, UNSPECIFIED 02/14/2009  . DYSPNEA 02/14/2009  . CHEST PAIN-UNSPECIFIED 02/14/2009    Past Surgical History:  Procedure Laterality Date  . BACK SURGERY    . Battery change     DBS, 12/2009  . BIOPSY N/A 11/29/2013   Procedure: GASTRIC BIOPSY;  Surgeon: Danie Binder, MD;  Location: [facility] ORS;  Service: Endoscopy;  Laterality: N/A;  . BURR HOLE W/ STEREOTACTIC INSERTION OF DBS LEADS / INTRAOP MICROELECTRODE RECORDING     2007  . CARPAL TUNNEL RELEASE Right   . COLONOSCOPY WITH PROPOFOL N/A 11/29/2013   Procedure: COLONOSCOPY WITH PROPOFOL;  Surgeon: Danie Binder, MD;  Location: [facility] ORS;  Service: Endoscopy;  Laterality: N/A;  entered cecum @ [phone]; total cecal withdrawal time 21 minutes   . ELBOW SURGERY Left    after fx  . ESOPHAGEAL DILATION N/A 11/29/2013   Procedure: ESOPHAGEAL DILATION;  Surgeon: Danie Binder, MD;  Location: [facility] ORS;  Service: Endoscopy;  Laterality: N/A;  [place] 14/15/16  . ESOPHAGOGASTRODUODENOSCOPY (EGD) WITH PROPOFOL N/A 11/29/2013   Procedure: ESOPHAGOGASTRODUODENOSCOPY (EGD) WITH PROPOFOL;  Surgeon: Danie Binder, MD;  Location: [facility];  Service: Endoscopy;  Laterality: N/A;  . FOOT SURGERY Left    "something with 2nd 2."  .  NECK SURGERY     Fusion  . POLYPECTOMY N/A 11/29/2013   Procedure: POLYPECTOMY;  Surgeon: Danie Binder, MD;  Location: [facility];  Service: Endoscopy;  Laterality: N/A;  Distal Transverse Colon x2   . PR ANALYZE NEUROSTIM BRAIN, FIRST 1H  10/15/2012      . SUBTHALAMIC STIMULATOR BATTERY REPLACEMENT N/A 08/05/2013   Procedure: SUBTHALAMIC STIMULATOR BATTERY REPLACEMENT;  Surgeon: Erline Levine, MD;  Location: [facility]URO ORS;  Service: Neurosurgery;  Laterality: N/A;  . SUBTHALAMIC STIMULATOR BATTERY REPLACEMENT N/A 11/26/2015    Procedure: Implantable pulse generator battery change for deep brain stimulator;  Surgeon: Erline Levine, MD;  Location: [place] NEURO ORS;  Service: Neurosurgery;  Laterality: N/A;  . VAGINAL HYSTERECTOMY     "partial"  . WRIST SURGERY Right    after fx    OB History    No data available       Home Medications    Prior to Admission medications   Medication Sig Start Date End Date Taking? Authorizing Provider  ALPRAZolam Duanne Moron) 0.5 MG tablet Take 0.5 mg by mouth 2 (two) times daily as needed for anxiety.    Yes Historical Provider, MD  amLODipine (NORVASC) 10 MG tablet Take 10 mg by mouth daily.    Yes Historical Provider, MD  clopidogrel (PLAVIX) 75 MG tablet Take 75 mg by mouth daily.  01/24/15  Yes Historical Provider, MD  Dextromethorphan-Quinidine (NUEDEXTA) 20-10 MG CAPS Take 1 tablet by mouth 2 (two) times daily.   Yes Historical Provider, MD  DULoxetine (CYMBALTA) 60 MG capsule Take 120 mg by mouth daily.    Yes Historical Provider, MD  ergocalciferol (VITAMIN D2) 50000 units capsule Take 50,000 Units by mouth 2 (two) times a week.   Yes Historical Provider, MD  esomeprazole (NEXIUM) 40 MG capsule Take 40 mg by mouth daily before breakfast.   Yes Historical Provider, MD  fentaNYL (DURAGESIC - DOSED MCG/HR) 50 MCG/HR Place 50 mcg onto the skin every 3 (three) days.  01/31/16  Yes Historical Provider, MD  guaiFENesin-codeine 100-10 MG/5ML syrup Take 5 mLs by mouth every 6 (six) hours as needed for cough.  07/08/16  Yes Historical Provider, MD  HYDROcodone-acetaminophen (NORCO) 10-325 MG per tablet Take 1 tablet by mouth every 6 (six) hours as needed for moderate pain.    Yes Historical Provider, MD  Linaclotide (LINZESS) 145 MCG CAPS capsule Take 145 mcg by mouth daily as needed (for stomach).    Yes Historical Provider, MD  losartan (COZAAR) 100 MG tablet Take 100 mg by mouth daily.   Yes Historical Provider, MD  OVER THE COUNTER MEDICATION Apply 1 application topically 2 (two) times daily  as needed (for pain). "[place] Gel"   Yes Historical Provider, MD  pramipexole (MIRAPEX) 0.5 MG tablet Take 0.5 mg by mouth at bedtime.  06/18/16  Yes Historical Provider, MD  solifenacin (VESICARE) 10 MG tablet Take 10 mg by mouth daily.   Yes Historical Provider, MD    Family History Family History  Problem Relation Age of Onset  . Colon cancer Paternal Uncle     Social History Social History  Substance Use Topics  . Smoking status: Current Every Day Smoker    Packs/day: 0.25    Years: 25.00    Types: Cigarettes  . Smokeless tobacco: Never Used  . Alcohol use Yes     Comment: 1/2 glass wine rarely     Allergies   Aspirin; Codeine; and Oxycodone   Review of Systems Review of Systems  10 systems reviewed and all are negative for acute change except as noted in the HPI.    Physical Exam Updated Vital Signs BP 131/67   Pulse 63   Temp 98.1 F (36.7 C) (Oral)   Resp 17   SpO2 96%   Physical Exam Physical Exam  Nursing note and vitals reviewed. Constitutional: Well developed, well nourished, non-toxic, and in no acute distress. Disheveled appearance.  Head: Normocephalic and atraumatic.  Mouth/Throat: Oropharynx is clear and dry mucous membranes.  Neck: Normal range of motion. Neck supple.  Cardiovascular: Normal rate and regular rhythm.   Pulmonary/Chest: Effort normal and breath sounds normal.  Abdominal: Soft. There is no tenderness. There is no rebound and no guarding.  Musculoskeletal: Normal range of motion. Tenderness to low lumbar and upper thoracic spine, no stepoffs or deformities.  Neurological: Alert, no facial droop, fluent speech, moves all extremities symmetrically Skin: Skin is warm and dry.  Psychiatric: Cooperative   ED Treatments / Results  DIAGNOSTIC STUDIES:  Oxygen Saturation is 95% on RA, adequate by my interpretation.    COORDINATION OF CARE:  1:16 PM Discussed treatment plan with pt at bedside and pt agreed to plan.   Labs (all  labs ordered are listed, but only abnormal results are displayed) Labs Reviewed  CBC WITH DIFFERENTIAL/PLATELET - Abnormal; Notable for the following:       Result Value   Platelets 149 (*)    All other components within normal limits  BASIC METABOLIC PANEL - Abnormal; Notable for the following:    Potassium 3.3 (*)    Glucose, Bld 114 (*)    Calcium 8.8 (*)    All other components within normal limits  RAPID STREP SCREEN (NOT AT [facility])  CULTURE, GROUP A STREP [facility])    EKG  EKG Interpretation None       Radiology Dg Chest 2 View  Result Date: 07/08/2016 CLINICAL DATA:  71 year old female with cough congestion and thoracolumbar pain for 6 days with no known injury. Initial encounter. EXAM: CHEST  2 VIEW COMPARISON:  [facility] Chest radiographs 07/17/2013 FINDINGS: Seated upright AP and lateral views of the chest. Generator device for deep brain stimulator again projects over the right chest. Previous cervical ACDF. Osteopenia. Stable visualized osseous structures. Stable cholecystectomy clips. Negative visible bowel gas pattern. Lung volumes are within normal limits. Normal cardiac size and mediastinal contours. Visualized tracheal air column is within normal limits. The lungs are clear. No pneumothorax or pleural effusion. IMPRESSION: 1.  No acute cardiopulmonary abnormality. 2. Generator device for deep brain stimulator and cervical ACDF changes again noted. Electronically Signed   By: Genevie Ann M.D.   On: 07/08/2016 14:53   Dg Thoracic Spine 2 View  Result Date: 07/08/2016 CLINICAL DATA:  71 year old female with cough congestion and thoracolumbar pain for 6 days with no known injury. Initial encounter. EXAM: THORACIC SPINE 2 VIEWS COMPARISON:  Two-view chest radiographs 07/17/2013. [facility] CT cervical spine 11/03/2012 FINDINGS: Lower cervical ACDF changes down to C7 again noted and grossly stable since 2014 ; including evidence of chronic C7 cortical screw  fracture. Normal thoracic segmentation. Thoracic vertebral height and alignment appears stable since 2015. No thoracic compression fracture identified. The visible upper lumbar levels appear stable with the exception of progressed degenerative L1-L2 endplate spurring. Posterior ribs appear intact. Right chest DBS stimulator generator in place. Thoracic visceral contours appear stable. Stable cholecystectomy clips. Negative visible bowel gas pattern, but there is an 18 mm thin capsule like  device projecting in the left upper abdomen, possibly in the stomach or large bowel (arrows). IMPRESSION: 1.  No acute osseous abnormality identified in the thoracic spine. 2. 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 3. Questionable retained endoscopy capsule in the left upper quadrant (arrows). Electronically Signed   By: Genevie Ann M.D.   On: 07/08/2016 14:57   Dg Lumbar Spine Complete  Result Date: 07/08/2016 CLINICAL DATA:  71 year old female with cough congestion and thoracolumbar pain for 6 days with no known injury. Initial encounter. EXAM: LUMBAR SPINE - COMPLETE 4+ VIEW COMPARISON:  Thoracic spine study today reported separately. [facility] abdominal series 07/03/16, and CT lumbar spine 10/24/2015 FINDINGS: A different numbering system is used today compared to that on the 2017 CT. Normal thoracic segmentation was demonstrated on the thoracic series today with the lowest ribs at T12. By this numbering system the previous spinal fusion was at L5-S1 (described as L4-L5 on the prior CT). Correlation with radiographs is recommended prior to any operative intervention. The spinal hardware appears intact. Mild retrolisthesis of the adjacent segment (L4-L5 today) may have increased since May 2017. Other lumbar vertebral height and alignment appears stable, with advanced upper lumbar disc degeneration including vacuum disc and bulky endplate osteophytes. Grossly intact sacrum and SI joints.  Stable cholecystectomy clips. Non obstructed bowel gas pattern. With retained stool in the colon. 18-20 mm cylindrical foreign body re - demonstrated in the left upper quadrant. This might have been present on 07/03/2016, but was subtle on that exam in part related to mild motion artifact. IMPRESSION: 1. Advanced lumbar spine degeneration with no acute osseous abnormality identified. Note that due to transitional anatomy the numbering system on this study differs from the Lumbar Spine CT on 10/24/2015. 2. Question retained endoscopy capsule in the left upper quadrant, probably was present on the 07/03/2016 comparison also. Electronically Signed   By: Genevie Ann M.D.   On: 07/08/2016 15:05    Procedures Procedures (including critical care time)  Medications Ordered in ED Medications  alum & mag hydroxide-simeth (MAALOX/MYLANTA) 200-200-20 MG/5ML suspension 30 mL (not administered)  ondansetron (ZOFRAN) tablet 4 mg (not administered)  acetaminophen (TYLENOL) tablet 650 mg (not administered)  ALPRAZolam (XANAX) tablet 0.5 mg (not administered)  amLODipine (NORVASC) tablet 10 mg (10 mg Oral Not Given 07/09/16 0015)  clopidogrel (PLAVIX) tablet 75 mg (75 mg Oral Given 07/08/16 2349)  Dextromethorphan-Quinidine 20-10 MG CAPS 1 tablet (1 tablet Oral Not Given 07/09/16 0016)  DULoxetine (CYMBALTA) DR capsule 120 mg (120 mg Oral Given 07/08/16 2348)  pantoprazole (PROTONIX) EC tablet 40 mg (40 mg Oral Refused 07/09/16 0016)  linaclotide (LINZESS) capsule 145 mcg (not administered)  losartan (COZAAR) tablet 100 mg (100 mg Oral Not Given 07/09/16 0016)  pramipexole (MIRAPEX) tablet 0.5 mg (0.5 mg Oral Given 07/08/16 2349)  sodium chloride 0.9 % bolus 1,000 mL (0 mLs Intravenous Stopped 07/08/16 2101)     Initial Impression / Assessment and Plan / ED Course  I have reviewed the triage vital signs and the nursing notes.  Pertinent labs & imaging results that were available during my care of the patient were  reviewed by me and considered in my medical decision making (see chart for details).     History of Parkinson's disease and suitable for affect with pathological crying who presents with several days of cough, congestion, sore throat and runny nose. Vital signs within normal limits. She is nontoxic in no acute distress. Presentation seems consistent with  likely viral respiratory illness. Chest x-ray visualized and does not show evidence of pneumonia. Blood work otherwise reassuring. X-rays spine Visualized and without fracture. Discussed supportive management for likely viral respiratory illness.  During ED course, patient tearful and expresses suicidal ideation. All seems very passive without plan. Does endorse that her husband recently left her and was cheating on her. Denies homicidal  Thoughts or hallucinations. Will consult TTS at this time.   Final Clinical Impressions(s) / ED Diagnoses   Final diagnoses:  Viral URI with cough  Suicidal ideation    New Prescriptions New Prescriptions   No medications on file   I personally performed the services described in this documentation, which was scribed in my presence. The recorded information has been reviewed and is accurate.     Forde Dandy, MD 07/09/16 [id number]

## 2016-07-08 NOTE — ED Notes (Signed)
Pt's daughter called and reported that pt has been vomiting and having diarrhea x 2 weeks and just hasn't felt well. She reports pt has been staying in the bed due to not feeling well from cold symptoms recently. Daughter is [name] and can be reached at [phone]. Pt gave verbal consent to speak with daughter about her care.

## 2016-07-08 NOTE — ED Triage Notes (Signed)
When pt asked if she is suicidal during triage questions, pt states, "no, but I just want to die". Pt continues to deny suicidal thoughts, but also continues to state, "i just want to die".

## 2016-07-08 NOTE — ED Triage Notes (Signed)
Pt c/o sore throat, coughing, back pain that started 6 days ago. Pt was seen at [facility] ER on 07/03/16 for n/v/constipation and symptoms started when she got home from. BP 115/63, HR 100, O2 sat 98% on RA for EMS. Pt crying at time of triage.
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
## 2016-07-09 NOTE — ED Notes (Addendum)
This nurse spoke with pts daughter that lives in [place]. Pts daughter states "jimmy (pts son) lives close to pts house he can let her in". This nurse was given Jimmy's number, no one answered the phone.

## 2016-07-09 NOTE — ED Notes (Signed)
Pt was apparently discharged from the department approx 0900 this am.  Pt removed from track board at this time by this charge nurse.

## 2016-07-09 NOTE — ED Notes (Signed)
Spoke with pts sister that she resides with and she states that she is at home and will be there for EMS to bring her.  Pts sister's name is Lurlean Leyden [phone].
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
## 2016-07-09 NOTE — ED Notes (Signed)
Pt states she "does not have a key to her house". Pt states that she lives with her sister and her sisters husband. Pt gave this nurse her home number to call and get in touch with her sister at home. No one answered the phone. Pt is unable to get into her house any other way.

## 2016-07-09 NOTE — ED Provider Notes (Signed)
Contacted by [facility] who evaluated pt in the ED and felt she was appropriate for outpatient management. Cleared for discharge. Resources provided.  Disposition: Discharge  Condition: Good  I have discussed the results, Dx and Tx plan with the patient who expressed understanding and agree(s) with the plan. Discharge instructions discussed at great length. The patient was given strict return precautions who verbalized understanding of the instructions. No further questions at time of discharge.    New Prescriptions   No medications on file    Follow Up: Octavio Graves, DO [place] [address] [phone]  Schedule an appointment as soon as possible for a visit  As needed      Fatima Blank, MD 07/09/16 0003

## 2016-07-10 LAB — CULTURE, GROUP A STREP (THRC)

## 2016-07-11 ENCOUNTER — Ambulatory Visit: Payer: Self-pay | Admitting: Neurology
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
## 2016-08-13 ENCOUNTER — Ambulatory Visit: Payer: Self-pay | Admitting: Neurology
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
## 2016-08-21 ENCOUNTER — Ambulatory Visit (INDEPENDENT_AMBULATORY_CARE_PROVIDER_SITE_OTHER): Payer: Medicare Other | Admitting: Neurology

## 2016-08-21 ENCOUNTER — Encounter: Payer: Self-pay | Admitting: Neurology

## 2016-08-21 VITALS — BP 110/70 | HR 65

## 2016-08-21 DIAGNOSIS — Z720 Tobacco use: Secondary | ICD-10-CM | POA: Diagnosis not present

## 2016-08-21 DIAGNOSIS — Z9689 Presence of other specified functional implants: Secondary | ICD-10-CM | POA: Diagnosis not present

## 2016-08-21 DIAGNOSIS — G20 Parkinson's disease: Secondary | ICD-10-CM | POA: Diagnosis not present

## 2016-08-21 NOTE — Procedures (Signed)
DBS Programming was performed.   Detailed notes are no separate neurophysiologic worksheet.  Total time spent programming was 20 minutes.  Device was confirmed to be on.  Soft start was confirmed to be on.  Impedences were checked and were within normal limits.  Battery was checked and was determined to be at end of life (2.94).    Final settings were as follows:  Left brain electrode:     1-C+           ; Amplitude  3.5   V   ; Pulse width 60 microseconds;   Frequency   140   Hz.  Right brain electrode:     5-C+          ; Amplitude   4.4  V ;  Pulse width 60  microseconds;  Frequency   140    Hz.

## 2016-10-27 ENCOUNTER — Encounter: Payer: Self-pay | Admitting: Gastroenterology

## 2016-12-25 ENCOUNTER — Telehealth: Payer: Self-pay | Admitting: Neurology

## 2016-12-25 NOTE — Telephone Encounter (Signed)
Done

## 2016-12-25 NOTE — Telephone Encounter (Signed)
Agree with above.  Symptoms may be worse because of increased stress.  Please emphasize importance of medication compliance and follow-up with PCP to exclude any causes for infection.
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
## 2016-12-25 NOTE — Telephone Encounter (Signed)
PT's daughter Courtney Tucker said she believes that PT's pace maker needs to be turned up, she is having problems CB# [phone]
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
## 2017-02-23 ENCOUNTER — Encounter: Payer: Self-pay | Admitting: Neurology

## 2017-02-23 ENCOUNTER — Encounter: Payer: Self-pay | Admitting: Psychology

## 2017-02-23 ENCOUNTER — Ambulatory Visit (INDEPENDENT_AMBULATORY_CARE_PROVIDER_SITE_OTHER): Payer: Medicare Other | Admitting: Neurology

## 2017-02-23 VITALS — BP 128/62 | HR 84

## 2017-02-23 DIAGNOSIS — G20 Parkinson's disease: Secondary | ICD-10-CM

## 2017-02-23 DIAGNOSIS — Z720 Tobacco use: Secondary | ICD-10-CM | POA: Diagnosis not present

## 2017-02-23 DIAGNOSIS — F482 Pseudobulbar affect: Secondary | ICD-10-CM | POA: Diagnosis not present

## 2017-02-23 DIAGNOSIS — F329 Major depressive disorder, single episode, unspecified: Secondary | ICD-10-CM

## 2017-02-23 DIAGNOSIS — Z9689 Presence of other specified functional implants: Secondary | ICD-10-CM

## 2017-02-23 DIAGNOSIS — G20A1 Parkinson's disease without dyskinesia, without mention of fluctuations: Secondary | ICD-10-CM

## 2017-02-23 DIAGNOSIS — F32A Depression, unspecified: Secondary | ICD-10-CM

## 2017-02-23 NOTE — Procedures (Signed)
DBS Programming was performed.   Detailed notes are no separate neurophysiologic worksheet.  Total time spent programming was 20 minutes.  Device was confirmed to be on.  Soft start was confirmed to be on.  Impedences were checked and were within normal limits.  Battery was checked and was determined to be at end of life (2.90).    Final settings were as follows:  Left brain electrode:     1-C+           ; Amplitude  3.6   V   ; Pulse width 60 microseconds;   Frequency   140   Hz.  Right brain electrode:     5-C+          ; Amplitude   4.4  V ;  Pulse width 60  microseconds;  Frequency   140    Hz.
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
## 2017-02-23 NOTE — Addendum Note (Signed)
Addended byAnnamaria Helling on: 02/23/2017 02:43 PM   Modules accepted: Orders
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
## 2017-05-01 ENCOUNTER — Encounter: Payer: Self-pay | Admitting: Psychology

## 2017-05-01 ENCOUNTER — Ambulatory Visit: Payer: Medicare Other | Admitting: Neurology

## 2017-05-01 NOTE — Progress Notes (Signed)
Met with patient and daughter today in my office. They missed their appointment today in the clinic, but since they were here  I wanted to check in with resources and PACE referral. The patient and daughter had a referral to go look at [facility] and they did not show up. The referral coordinator contacted them back and  left a message, but that call was never returned.  I encouraged them to go to look at [facility] after leaving my office, since they were located close by. They reported that they would and I provided them with directions. The daughter also reported that they are looking into a day program called [facility] [place]. Pollie Meyer is the contact there [phone].

## 2017-06-12 ENCOUNTER — Ambulatory Visit: Payer: Self-pay | Admitting: Neurology

## 2017-08-04 ENCOUNTER — Ambulatory Visit: Payer: Self-pay | Admitting: Neurology

## 2017-09-08 NOTE — Progress Notes (Signed)
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 to decrease.  - Approximately 3 mins were spent counseling patient cessation techniques. We discussed various methods to help quit smoking, including deciding on a date to quit, joining a support group, pharmacological agents- nicotine gum/patch/lozenges, chantix,   - I will reassess her progress at future visit. 5.  Follow up is anticipated in the next few months, sooner should new neurologic issues arise.  Much greater than 50% of this visit was spent in counseling and coordinating care.  Total face to face time:  30 min

## 2017-09-10 ENCOUNTER — Ambulatory Visit (INDEPENDENT_AMBULATORY_CARE_PROVIDER_SITE_OTHER): Payer: Medicare Other | Admitting: Neurology

## 2017-09-10 ENCOUNTER — Encounter: Payer: Self-pay | Admitting: Neurology

## 2017-09-10 VITALS — BP 130/68 | HR 80

## 2017-09-10 DIAGNOSIS — Z720 Tobacco use: Secondary | ICD-10-CM | POA: Diagnosis not present

## 2017-09-10 DIAGNOSIS — Z9689 Presence of other specified functional implants: Secondary | ICD-10-CM | POA: Diagnosis not present

## 2017-09-10 DIAGNOSIS — F482 Pseudobulbar affect: Secondary | ICD-10-CM | POA: Diagnosis not present

## 2017-09-10 DIAGNOSIS — G20 Parkinson's disease: Secondary | ICD-10-CM

## 2017-09-10 DIAGNOSIS — F17210 Nicotine dependence, cigarettes, uncomplicated: Secondary | ICD-10-CM | POA: Diagnosis not present

## 2017-09-10 MED ORDER — CARBIDOPA-LEVODOPA ER 25-100 MG PO TBCR: 1.0000 | EXTENDED_RELEASE_TABLET | Freq: Three times a day (TID) | ORAL | 5 refills | Status: DC

## 2017-09-10 MED ORDER — DEXTROMETHORPHAN-QUINIDINE 20-10 MG PO CAPS: 1.0000 | ORAL_CAPSULE | Freq: Two times a day (BID) | ORAL | 0 refills | Status: DC

## 2017-09-10 MED ORDER — DEXTROMETHORPHAN-QUINIDINE 20-10 MG PO CAPS: 1.0000 | ORAL_CAPSULE | Freq: Two times a day (BID) | ORAL | 5 refills | Status: DC

## 2017-09-10 NOTE — Procedures (Signed)
DBS Programming was performed.   Detailed notes are no separate neurophysiologic worksheet.  Total time spent programming was 20 minutes.  Device was confirmed to be on.  Soft start was confirmed to be on.  Impedences were checked and were within normal limits.  Battery was checked and was determined to be at end of life (2.82).    Final settings were as follows:  Left brain electrode:     1-C+           ; Amplitude  3.6   V   ; Pulse width 60 microseconds;   Frequency   140   Hz.  Right brain electrode:     5-C+          ; Amplitude   4.4  V ;  Pulse width 60  microseconds;  Frequency   140    Hz.

## 2017-09-10 NOTE — Addendum Note (Signed)
Addended byAnnamaria Helling on: 09/10/2017 12:52 PM   Modules accepted: Orders

## 2017-09-10 NOTE — Patient Instructions (Signed)
Start Carbidopa Levodopa 25/100 CR as follows:  1 tablet at 9 am for one week Then 1 tablet at 9 am and 1 tablet at 1 pm for one week Then 1 tablet at 9 am, 1 tablet at 1 pm, and 1 tablet at 5 pm

## 2017-09-14 ENCOUNTER — Telehealth: Payer: Self-pay | Admitting: Neurology

## 2017-09-14 NOTE — Telephone Encounter (Signed)
Received approval for Nuedexta valid until 05/25/2018.

## 2017-10-23 ENCOUNTER — Telehealth: Payer: Self-pay | Admitting: Neurology

## 2017-10-23 NOTE — Telephone Encounter (Signed)
Patient's son Pollie Friar [phone]) called in stating patient has had a recent change in speech. States the last two weeks she has been more slurred and no one can understand her. She had a fall a couple weeks ago, but he states no head injury. No recent medicine change, besides from our office at the end of April.  I advised them to go to urgent care/ER for evaluation to r/o stroke.  Dr. Carles Collet Juluis Rainier.

## 2017-10-26 NOTE — Telephone Encounter (Signed)
Left message on machine for patient's son to call back.   

## 2017-10-26 NOTE — Telephone Encounter (Signed)
I agree with you.  Her speech is not good at baseline but with a fall need to r/o head injury.  [facility] can potentially do this as well.  I don't see that she went to ER (at least in our system) so please follow back up with them

## 2017-10-27 NOTE — Telephone Encounter (Signed)
Tried to call again with no answer  

## 2018-03-10 NOTE — Progress Notes (Signed)
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 send referral to home health 2.  pseudobulbar affect, with pathologic crying.  -unable to afford nuedexta 3.  Anxiety/depression  - refer to psychiatry  -complex living/family situation with no stable caregiver with several family members/caregivers with addiction issues.  This contributes to her depression 4. Tobacco abuse  -Currently smoking 3 packs/day    - Patient was informed of the dangers of tobacco abuse including stroke, cancer, and MI, as well as benefits of tobacco cessation.  - Patient not willing to quit at this time.  - Approximately 3 mins were spent counseling patient cessation techniques. We discussed various methods to help quit smoking, including deciding  on a date to quit, joining a support group, pharmacological agents- nicotine gum/patch/lozenges, chantix,   - I will reassess her progress at future visit.  5. Follow up is anticipated in the next 5 months, sooner should new neurologic issues arise.  Much greater than 50% of this visit was spent in counseling and coordinating care.  Total face to face time:  25 min, not including DBS time

## 2018-03-12 ENCOUNTER — Ambulatory Visit (INDEPENDENT_AMBULATORY_CARE_PROVIDER_SITE_OTHER): Payer: Medicare Other | Admitting: Neurology

## 2018-03-12 ENCOUNTER — Encounter: Payer: Self-pay | Admitting: Neurology

## 2018-03-12 VITALS — BP 142/80 | HR 68

## 2018-03-12 DIAGNOSIS — G20 Parkinson's disease: Secondary | ICD-10-CM | POA: Diagnosis not present

## 2018-03-12 DIAGNOSIS — Z720 Tobacco use: Secondary | ICD-10-CM | POA: Diagnosis not present

## 2018-03-12 DIAGNOSIS — F329 Major depressive disorder, single episode, unspecified: Secondary | ICD-10-CM | POA: Diagnosis not present

## 2018-03-12 DIAGNOSIS — F32A Depression, unspecified: Secondary | ICD-10-CM

## 2018-03-12 NOTE — Patient Instructions (Addendum)
1. We are referring you to Dr Vertell Limber at [facility]. They will call you directly to set up an appointment date and time. If you do not hear from them they can be contacted directly at [phone].  2. We will send a referral to [name] to see if they will restart physical therapy.   3. We will send a referral to behavioral health for psychiatry. You will have to call them directly to schedule. They can be reached at [phone].

## 2018-03-12 NOTE — Procedures (Signed)
DBS Programming was performed.   Detailed notes are no separate neurophysiologic worksheet.  Total time spent programming was 3 minutes.  Device was confirmed to be on.  Soft start was confirmed to be on.  Impedences were checked and were within normal limits.  Battery was checked and was determined to be at end of life (2.57).    Final settings were as follows:  Left brain electrode:     1-C+           ; Amplitude  3.6   V   ; Pulse width 60 microseconds;   Frequency   140   Hz.  Right brain electrode:     5-C+          ; Amplitude   4.4  V ;  Pulse width 60  microseconds;  Frequency   140    Hz.

## 2018-03-16 ENCOUNTER — Telehealth: Payer: Self-pay | Admitting: *Deleted

## 2018-03-16 NOTE — Telephone Encounter (Signed)
-----   Message from [place], DO sent at 03/16/2018 12:42 PM EDT ----- Can you call and make sure that this patient has appt with Dr. Vertell Limber.  She needs Battery change asap with Dr. Vertell Limber.  So does Tillie Fantasia so if you can ask about him on same phone call that would help.  Both were seen here last week but Vertell Limber was on vacation last week.  Thanks!

## 2018-03-23 NOTE — Telephone Encounter (Signed)
This encounter was created in error - please disregard.

## 2018-03-23 NOTE — Addendum Note (Signed)
Addended by: Chester Holstein on: 03/23/2018 02:49 PM   Modules accepted: Level of Service, SmartSet

## 2018-03-25 ENCOUNTER — Other Ambulatory Visit: Payer: Self-pay | Admitting: Neurosurgery

## 2018-03-26 ENCOUNTER — Telehealth: Payer: Self-pay | Admitting: Neurology

## 2018-03-26 NOTE — Telephone Encounter (Signed)
[facility] called for speech therapy orders 2x week for 2 weeks. Order given.

## 2018-03-26 NOTE — H&P (Signed)
Patient ID:   [id number] Patient: Courtney Tucker  Date of Birth: May 17, 1946 Visit Type: Office Visit   Date: 03/24/2018 02:15 PM Provider: Marchia Meiers. Vertell Limber MD   This 72 year old female presents for Parkinson's Disease.  HISTORY OF PRESENT ILLNESS:  1.  Parkinson's Disease  Patient was referred by Dr. Carles Collet for battery change of her IPG for her DBS. Patient has h/o prior stroke and Parkinson's and has considerable difficulty speaking. Her daughter accompanied her to her appointment today.         Medical/Surgical/Interim History Reviewed, no change.  Last detailed document date:11/19/2015.     Family History:  Reviewed, no changes.  Last detailed document date:11/19/2015.   Social History: Reviewed, no changes. Last detailed document date: 11/19/2015.    MEDICATIONS: (added, continued or stopped this visit) Started Medication Directions Instruction Stopped   COZAAR take 1 tablet by oral route  every day     Cymbalta Take 1 tablet by mouth daily     hydrocodone  ORAL take 1 tablet by mouth as needed for pain     Mirapex take 1 tablet by oral route  every day     Norvasc take 1 tablet by oral route  every day     Plavix take 1 tablet by oral route once     Vesicare take 1 tablet by oral route  every day     Xanax take 1 tablet by oral route 3 times every day       ALLERGIES: Ingredient Reaction Medication Name Comment  MULTIVITAMIN WITH MINERALS COMBINATION NO.22  Tozal   ASPIRIN     LUTEIN  Tozal   DOCOSAHEXANOIC ACID  Tozal   OMEGA-3 FATTY ACIDS  Tozal   EICOSAPENTAENOIC ACID  Tozal       PHYSICAL EXAM:   Vitals Date Temp F BP Pulse Ht In Wt Lb BMI BSA Pain Score  03/24/2018  145/70 82 62 140 25.61  4/10      IMPRESSION:   Patient needs to have the battery in her IPG changed.  Patient is on an anti-coagulant, anti-inflammatory or supplement that may increase bleeding time. Patient advised to stop medicine prior to surgery.  Comments:  Patient  will stop her Plavix after tomorrow's dose in preparation of November 5th surgery  PLAN:  1. IPG battery changed scheduled 2. Follow-up after battery change  Orders: Instruction(s)/Education: Assessment Instruction  I10 Hypertension education  Z68.25 Lifestyle education regarding diet   Completed Orders (this encounter) Order Details Reason Side Interpretation Result Initial Treatment Date Region  Hypertension education Continue to monitor blood pressure , if blood pressure continues elevated contact primary care        Lifestyle education regarding diet Patient encourage to eat a well balance diet         Assessment/Plan   # Detail Type Description   1. Assessment End of battery life of deep brain stimulator (Z46.2).       2. Assessment Parkinsons disease (G20).       3. Assessment Essential (primary) hypertension (I10).       4. Assessment Body mass index (BMI) 25.0-25.9, adult (H06.23).   Plan Orders Today's instructions / counseling include(s) Lifestyle education regarding diet. Clinical information/comments: Patient encourage to eat a well balance diet.         Pain Management Plan Pain Scale: 4/10. Method: Numeric Pain Intensity Scale. Location: Parkinson's. Onset: 06/18/2012. Duration: varies. Quality: discomforting. Pain management follow-up plan of care: Patient taken medication as  prescribed.  Fall Risk Plan The patient has not fallen in the last year.              Provider:  Marchia Meiers. Vertell Limber MD  03/24/2018 08:27 PM Dictation edited by: Mirian Mo    CC Providers: [address] [place],  [place]  [id number]   Wells Guiles Tat  [address] [place], [place] [address]               Electronically signed by Marchia Meiers. Vertell Limber MD on 03/25/2018 06:54 PM

## 2018-03-27 ENCOUNTER — Encounter (HOSPITAL_COMMUNITY): Payer: Self-pay | Admitting: *Deleted

## 2018-03-27 NOTE — Progress Notes (Signed)
Spoke to patient's daughter for preop call. Per chart patient has trouble speaking and daughter confirmed this. Denies CP, shob, or cardiology visit.

## 2018-03-30 ENCOUNTER — Encounter (HOSPITAL_COMMUNITY)
Admission: RE | Disposition: A | Discharge status: Home / Self Care | Payer: Self-pay | Source: Ambulatory Visit | Attending: Neurosurgery

## 2018-03-30 ENCOUNTER — Ambulatory Visit (HOSPITAL_COMMUNITY)
Admission: RE | Admit: 2018-03-30 | Discharge: 2018-03-30 | Disposition: A | Discharge status: Home / Self Care | Payer: Medicare Other | Source: Ambulatory Visit | Attending: Neurosurgery | Admitting: Neurosurgery

## 2018-03-30 ENCOUNTER — Ambulatory Visit (HOSPITAL_COMMUNITY): Payer: Medicare Other

## 2018-03-30 ENCOUNTER — Encounter (HOSPITAL_COMMUNITY): Payer: Self-pay | Admitting: Urology

## 2018-03-30 DIAGNOSIS — I10 Essential (primary) hypertension: Secondary | ICD-10-CM | POA: Insufficient documentation

## 2018-03-30 DIAGNOSIS — Z8673 Personal history of transient ischemic attack (TIA), and cerebral infarction without residual deficits: Secondary | ICD-10-CM | POA: Insufficient documentation

## 2018-03-30 DIAGNOSIS — G20 Parkinson's disease: Secondary | ICD-10-CM | POA: Insufficient documentation

## 2018-03-30 DIAGNOSIS — Z462 Encounter for fitting and adjustment of other devices related to nervous system and special senses: Secondary | ICD-10-CM | POA: Insufficient documentation

## 2018-03-30 DIAGNOSIS — Z886 Allergy status to analgesic agent status: Secondary | ICD-10-CM | POA: Insufficient documentation

## 2018-03-30 HISTORY — PX: PULSE GENERATOR IMPLANT: SHX5370

## 2018-03-30 LAB — CBC
HCT: 44.7 % (ref 36.0–46.0)
Hemoglobin: 14.6 g/dL (ref 12.0–15.0)
MCH: 31.5 pg (ref 26.0–34.0)
MCHC: 32.7 g/dL (ref 30.0–36.0)
MCV: 96.3 fL (ref 80.0–100.0)
Platelets: 238 10*3/uL (ref 150–400)
RBC: 4.64 MIL/uL (ref 3.87–5.11)
RDW: 12.4 % (ref 11.5–15.5)
WBC: 6.2 10*3/uL (ref 4.0–10.5)
nRBC: 0 % (ref 0.0–0.2)

## 2018-03-30 LAB — BASIC METABOLIC PANEL
Anion gap: 9 (ref 5–15)
BUN: 9 mg/dL (ref 8–23)
CO2: 28 mmol/L (ref 22–32)
Calcium: 9.3 mg/dL (ref 8.9–10.3)
Chloride: 105 mmol/L (ref 98–111)
Creatinine, Ser: 0.68 mg/dL (ref 0.44–1.00)
GFR calc Af Amer: >60 mL/min (ref >60)
GFR calc non Af Amer: >60 mL/min (ref >60)
Glucose, Bld: 106 mg/dL — ABNORMAL HIGH (ref 70–99)
Potassium: 3.7 mmol/L (ref 3.5–5.1)
Sodium: 142 mmol/L (ref 135–145)

## 2018-03-30 SURGERY — UNILATERAL PULSE GENERATOR IMPLANT
Anesthesia: General | Laterality: Right

## 2018-03-30 MED ORDER — VANCOMYCIN HCL 1000 MG IV SOLR
INTRAVENOUS | Status: AC
  Filled 2018-03-30: qty 1000

## 2018-03-30 MED ORDER — PROPOFOL 10 MG/ML IV BOLUS
INTRAVENOUS | Status: DC | PRN
  Administered 2018-03-30: 100 mg via INTRAVENOUS

## 2018-03-30 MED ORDER — CEFAZOLIN SODIUM-DEXTROSE 2-4 GM/100ML-% IV SOLN
2.0000 g | INTRAVENOUS | Status: AC
  Administered 2018-03-30: 2 g via INTRAVENOUS

## 2018-03-30 MED ORDER — ONDANSETRON HCL 4 MG/2ML IJ SOLN
INTRAMUSCULAR | Status: DC | PRN
  Administered 2018-03-30: 4 mg via INTRAVENOUS

## 2018-03-30 MED ORDER — LIDOCAINE 2% (20 MG/ML) 5 ML SYRINGE
INTRAMUSCULAR | Status: AC
  Filled 2018-03-30: qty 5

## 2018-03-30 MED ORDER — BUPIVACAINE HCL (PF) 0.5 % IJ SOLN
INTRAMUSCULAR | Status: DC | PRN
  Administered 2018-03-30: 10 mL

## 2018-03-30 MED ORDER — BACITRACIN ZINC 500 UNIT/GM EX OINT
TOPICAL_OINTMENT | CUTANEOUS | Status: AC
  Filled 2018-03-30: qty 28.35

## 2018-03-30 MED ORDER — LIDOCAINE HCL (CARDIAC) PF 100 MG/5ML IV SOSY
PREFILLED_SYRINGE | INTRAVENOUS | Status: DC | PRN
  Administered 2018-03-30: 60 mg via INTRATRACHEAL

## 2018-03-30 MED ORDER — PROPOFOL 10 MG/ML IV BOLUS
INTRAVENOUS | Status: AC
  Filled 2018-03-30: qty 20

## 2018-03-30 MED ORDER — BUPIVACAINE HCL (PF) 0.5 % IJ SOLN
INTRAMUSCULAR | Status: AC
  Filled 2018-03-30: qty 30

## 2018-03-30 MED ORDER — CHLORHEXIDINE GLUCONATE CLOTH 2 % EX PADS: 6.0000 | MEDICATED_PAD | Freq: Once | CUTANEOUS | Status: DC

## 2018-03-30 MED ORDER — ONDANSETRON HCL 4 MG/2ML IJ SOLN
INTRAMUSCULAR | Status: AC
  Filled 2018-03-30: qty 2

## 2018-03-30 MED ORDER — SUCCINYLCHOLINE CHLORIDE 200 MG/10ML IV SOSY
PREFILLED_SYRINGE | INTRAVENOUS | Status: AC
  Filled 2018-03-30: qty 10

## 2018-03-30 MED ORDER — FENTANYL CITRATE (PF) 250 MCG/5ML IJ SOLN
INTRAMUSCULAR | Status: DC | PRN
  Administered 2018-03-30 (×2): 25 ug via INTRAVENOUS
  Administered 2018-03-30: 50 ug via INTRAVENOUS
  Administered 2018-03-30 (×2): 25 ug via INTRAVENOUS

## 2018-03-30 MED ORDER — 0.9 % SODIUM CHLORIDE (POUR BTL) OPTIME
TOPICAL | Status: DC | PRN
  Administered 2018-03-30: 1000 mL

## 2018-03-30 MED ORDER — FENTANYL CITRATE (PF) 250 MCG/5ML IJ SOLN
INTRAMUSCULAR | Status: AC
  Filled 2018-03-30: qty 5

## 2018-03-30 MED ORDER — FENTANYL CITRATE (PF) 100 MCG/2ML IJ SOLN: 25.0000 ug | INTRAMUSCULAR | Status: DC | PRN

## 2018-03-30 MED ORDER — PROPOFOL 500 MG/50ML IV EMUL
INTRAVENOUS | Status: DC | PRN
  Administered 2018-03-30: 150 ug/kg/min via INTRAVENOUS

## 2018-03-30 MED ORDER — DEXAMETHASONE SODIUM PHOSPHATE 10 MG/ML IJ SOLN
INTRAMUSCULAR | Status: AC
  Filled 2018-03-30: qty 1

## 2018-03-30 MED ORDER — LIDOCAINE-EPINEPHRINE 1 %-1:100000 IJ SOLN
INTRAMUSCULAR | Status: DC | PRN
  Administered 2018-03-30: 10 mL

## 2018-03-30 MED ORDER — LIDOCAINE-EPINEPHRINE 1 %-1:100000 IJ SOLN
INTRAMUSCULAR | Status: AC
  Filled 2018-03-30: qty 1

## 2018-03-30 MED ORDER — LACTATED RINGERS IV SOLN
INTRAVENOUS | Status: DC
  Administered 2018-03-30 (×2): via INTRAVENOUS

## 2018-03-30 MED ORDER — DEXAMETHASONE SODIUM PHOSPHATE 10 MG/ML IJ SOLN
INTRAMUSCULAR | Status: DC | PRN
  Administered 2018-03-30: 10 mg via INTRAVENOUS

## 2018-03-30 MED ORDER — VANCOMYCIN HCL 1000 MG IV SOLR
INTRAVENOUS | Status: DC | PRN
  Administered 2018-03-30: 1000 mg

## 2018-03-30 MED ORDER — CEFAZOLIN SODIUM-DEXTROSE 2-4 GM/100ML-% IV SOLN
INTRAVENOUS | Status: AC
  Filled 2018-03-30: qty 100

## 2018-03-30 MED ORDER — ROCURONIUM BROMIDE 50 MG/5ML IV SOSY
PREFILLED_SYRINGE | INTRAVENOUS | Status: AC
  Filled 2018-03-30: qty 5

## 2018-03-30 MED ORDER — ONDANSETRON HCL 4 MG/2ML IJ SOLN: 4.0000 mg | Freq: Once | INTRAMUSCULAR | Status: DC | PRN

## 2018-03-30 MED ORDER — ETOMIDATE 2 MG/ML IV SOLN
INTRAVENOUS | Status: AC
  Filled 2018-03-30: qty 10

## 2018-03-30 SURGICAL SUPPLY — 50 items
BANDAGE ADH SHEER 1  50/CT (GAUZE/BANDAGES/DRESSINGS) IMPLANT
BLADE CLIPPER SURG (BLADE) IMPLANT
BOOT SUTURE AID YELLOW STND (SUTURE) IMPLANT
CANISTER SUCT 3000ML PPV (MISCELLANEOUS) ×3 IMPLANT
CLIP RANEY DISP (INSTRUMENTS) IMPLANT
COVER BACK TABLE 60X90IN (DRAPES) IMPLANT
COVER WAND RF STERILE (DRAPES) ×6 IMPLANT
DECANTER SPIKE VIAL GLASS SM (MISCELLANEOUS) ×3 IMPLANT
DERMABOND ADVANCED (GAUZE/BANDAGES/DRESSINGS) ×2
DERMABOND ADVANCED .7 DNX12 (GAUZE/BANDAGES/DRESSINGS) ×1 IMPLANT
DRAPE C-ARM 42X72 X-RAY (DRAPES) IMPLANT
DRAPE CAMERA CLOSED 9X96 (DRAPES) ×3 IMPLANT
DRAPE ORTHO SPLIT 77X108 STRL (DRAPES) ×4
DRAPE SURG ORHT 6 SPLT 77X108 (DRAPES) ×2 IMPLANT
DRSG OPSITE POSTOP 4X6 (GAUZE/BANDAGES/DRESSINGS) ×3 IMPLANT
DRSG TELFA 3X8 NADH (GAUZE/BANDAGES/DRESSINGS) IMPLANT
DURAPREP 26ML APPLICATOR (WOUND CARE) ×3 IMPLANT
GAUZE 4X4 16PLY RFD (DISPOSABLE) IMPLANT
GAUZE SPONGE 4X4 12PLY STRL (GAUZE/BANDAGES/DRESSINGS) IMPLANT
GLOVE BIO SURGEON STRL SZ8 (GLOVE) ×3 IMPLANT
GLOVE BIOGEL PI IND STRL 7.5 (GLOVE) ×3 IMPLANT
GLOVE BIOGEL PI INDICATOR 7.5 (GLOVE) ×6
GLOVE ECLIPSE 8.0 STRL XLNG CF (GLOVE) ×3 IMPLANT
GLOVE INDICATOR 8.0 STRL GRN (GLOVE) ×3 IMPLANT
GLOVE INDICATOR 8.5 STRL (GLOVE) ×3 IMPLANT
GOWN STRL REUS W/ TWL LRG LVL3 (GOWN DISPOSABLE) ×1 IMPLANT
GOWN STRL REUS W/ TWL XL LVL3 (GOWN DISPOSABLE) ×1 IMPLANT
GOWN STRL REUS W/TWL 2XL LVL3 (GOWN DISPOSABLE) ×3 IMPLANT
GOWN STRL REUS W/TWL LRG LVL3 (GOWN DISPOSABLE) ×2
GOWN STRL REUS W/TWL XL LVL3 (GOWN DISPOSABLE) ×2
KIT BASIN OR (CUSTOM PROCEDURE TRAY) ×3 IMPLANT
KIT REMOVER STAPLE SKIN (MISCELLANEOUS) IMPLANT
MARKER SKIN DUAL TIP RULER LAB (MISCELLANEOUS) IMPLANT
NEEDLE HYPO 25X1 1.5 SAFETY (NEEDLE) ×3 IMPLANT
NEEDLE SPNL 18GX3.5 QUINCKE PK (NEEDLE) IMPLANT
NEUROSTIM OCTOPOLAR ~~LOC~~ 49X65 (Neuro Prosthesis/Implant) ×3 IMPLANT
NEUROSTIM PROGRAMMER 2.2X3.7 (NEUROSURGERY SUPPLIES) ×3 IMPLANT
NS IRRIG 1000ML POUR BTL (IV SOLUTION) ×3 IMPLANT
PACK LAMINECTOMY NEURO (CUSTOM PROCEDURE TRAY) ×3 IMPLANT
PAD ARMBOARD 7.5X6 YLW CONV (MISCELLANEOUS) ×3 IMPLANT
STAPLER SKIN PROX WIDE 3.9 (STAPLE) ×3 IMPLANT
SUT ETHILON 3 0 PS 1 (SUTURE) IMPLANT
SUT SILK 2 0 TIES 10X30 (SUTURE) IMPLANT
SUT VIC AB 2-0 CP2 18 (SUTURE) ×3 IMPLANT
SUT VIC AB 2-0 CT2 18 VCP726D (SUTURE) IMPLANT
SUT VIC AB 3-0 SH 8-18 (SUTURE) ×6 IMPLANT
SYR CONTROL 10ML LL (SYRINGE) ×3 IMPLANT
TOWEL GREEN STERILE (TOWEL DISPOSABLE) ×3 IMPLANT
TOWEL GREEN STERILE FF (TOWEL DISPOSABLE) ×3 IMPLANT
WATER STERILE IRR 1000ML POUR (IV SOLUTION) ×3 IMPLANT

## 2018-03-30 NOTE — Anesthesia Postprocedure Evaluation (Signed)
Anesthesia Post Note  Patient: Courtney Tucker  Procedure(s) Performed: Right Implantable pulse generator change (Right )     Patient location during evaluation: PACU Anesthesia Type: General Level of consciousness: awake and alert, oriented and awake Pain management: pain level controlled Vital Signs Assessment: post-procedure vital signs reviewed and stable Respiratory status: spontaneous breathing, nonlabored ventilation and respiratory function stable Cardiovascular status: blood pressure returned to baseline and stable Postop Assessment: no apparent nausea or vomiting Anesthetic complications: no    Last Vitals:  Vitals:   03/30/18 1359 03/30/18 1415  BP: 129/62 (!) 107/59  Pulse: 100 95  Resp: 20 12  Temp: 36.5 C   SpO2: 95% 94%    Last Pain:  Vitals:   03/30/18 1200  TempSrc:   PainSc: [place]

## 2018-03-30 NOTE — Op Note (Signed)
03/30/2018  1:46 PM  PATIENT:  Courtney Tucker  72 y.o. female  PRE-OPERATIVE DIAGNOSIS:  End of battery life for deep brain stimulator  POST-OPERATIVE DIAGNOSIS:  End of battery life for deep brain stimulator  PROCEDURE:  Procedure(s): Right Implantable pulse generator change (Right)  SURGEON:  Surgeon(s) and Role:    Erline Levine, MD - Primary  PHYSICIAN ASSISTANT: None  ASSISTANTS: Poteat, RN   ANESTHESIA:   LMA  EBL:  20 mL   BLOOD ADMINISTERED:none  DRAINS: none   LOCAL MEDICATIONS USED:  MARCAINE    and LIDOCAINE   SPECIMEN:  No Specimen  DISPOSITION OF SPECIMEN:  N/A  COUNTS:  YES  TOURNIQUET:  * No tourniquets in log *  DICTATION: DICTATION: Patient has implanted subthalamic stimulator electrodes and IPG, which is now depleted.  It was elected for patient to undergo IPG revision.  PROCEDURE: Patient was brought to the operating room and given intravenous sedation.  Right upper chest was prepped with betadine scrub and Duraprep.  Area of planned incision was infiltrated with lidocaine.  Prior incision was reopened and the old IPG was externalized.  Adaptor was reconnected to new IPG which was placed in the pocket.  Wound was irrigated with vancomycin. Then irrigated once more.  Incision was closed with 2-0 Vicryl and 3-0 vicryl sutures and dressed with Dermabond and a sterile occlusive dressing.  Counts were correct at the end of the case.  PLAN OF CARE: Discharge to home after PACU  PATIENT DISPOSITION:  PACU - hemodynamically stable.   Delay start of Pharmacological VTE agent (>24hrs) due to surgical blood loss or risk of bleeding: yes

## 2018-03-30 NOTE — Anesthesia Preprocedure Evaluation (Addendum)
Anesthesia Evaluation  Patient identified by MRN, date of birth, ID band Patient awake    Reviewed: Allergy & Precautions, NPO status , Patient's Chart, lab work & pertinent test results  History of Anesthesia Complications (+) PONV and history of anesthetic complications  Airway Mallampati: II  TM Distance: >3 FB Neck ROM: Full    Dental  (+) Teeth Intact, Dental Advisory Given   Pulmonary sleep apnea , Current Smoker,    Pulmonary exam normal breath sounds clear to auscultation       Cardiovascular hypertension, Pt. on medications Normal cardiovascular exam Rhythm:Regular Rate:Normal     Neuro/Psych PSYCHIATRIC DISORDERS Anxiety Depression  End of battery life for deep brain stimulator; PD TIA   GI/Hepatic Neg liver ROS, GERD  Medicated,  Endo/Other  negative endocrine ROS  Renal/GU negative Renal ROS     Musculoskeletal  (+) Arthritis ,   Abdominal   Peds  Hematology  (+) Blood dyscrasia (Plavix), ,   Anesthesia Other Findings Day of surgery medications reviewed with the patient.  Reproductive/Obstetrics                             Anesthesia Physical Anesthesia Plan  ASA: III  Anesthesia Plan: General   Post-op Pain Management:    Induction: Intravenous  PONV Risk Score and Plan: 3 and Propofol infusion and Treatment may vary due to age or medical condition  Airway Management Planned: LMA  Additional Equipment:   Intra-op Plan:   Post-operative Plan: Extubation in OR  Informed Consent: I have reviewed the patients History and Physical, chart, labs and discussed the procedure including the risks, benefits and alternatives for the proposed anesthesia with the patient or authorized representative who has indicated his/her understanding and acceptance.   Dental advisory given  Plan Discussed with: CRNA  Anesthesia Plan Comments:        Anesthesia Quick  Evaluation

## 2018-03-30 NOTE — Transfer of Care (Signed)
Immediate Anesthesia Transfer of Care Note  Patient: Courtney Tucker  Procedure(s) Performed: Right Implantable pulse generator change (Right )  Patient Location: PACU  Anesthesia Type:General  Level of Consciousness: awake, drowsy and patient cooperative  Airway & Oxygen Therapy: Patient Spontanous Breathing and Patient connected to face mask oxygen  Post-op Assessment: Report given to RN, Post -op Vital signs reviewed and stable and Patient moving all extremities  Post vital signs: Reviewed and stable  Last Vitals:  Vitals Value Taken Time  BP 129/62 03/30/2018  1:57 PM  Temp    Pulse 99 03/30/2018  1:58 PM  Resp 26 03/30/2018  1:58 PM  SpO2 96 % 03/30/2018  1:58 PM  Vitals shown include unvalidated device data.  Last Pain:  Vitals:   03/30/18 1200  TempSrc:   PainSc: 4       Patients Stated Pain Goal: 4 (97/67/34 1937)  Complications: No apparent anesthesia complications

## 2018-03-30 NOTE — Anesthesia Procedure Notes (Addendum)
Procedure Name: LMA Insertion Date/Time: 03/30/2018 1:00 PM Performed by: Kathryne Hitch, CRNA Pre-anesthesia Checklist: Patient identified, Emergency Drugs available, Suction available, Patient being monitored and Timeout performed Patient Re-evaluated:Patient Re-evaluated prior to induction Oxygen Delivery Method: Circle system utilized Preoxygenation: Pre-oxygenation with 100% oxygen Induction Type: IV induction Ventilation: Mask ventilation without difficulty LMA: LMA inserted LMA Size: 4.0 Number of attempts: 1 Tube secured with: Tape Dental Injury: Teeth and Oropharynx as per pre-operative assessment

## 2018-03-30 NOTE — Brief Op Note (Signed)
03/30/2018  1:46 PM  PATIENT:  Courtney Tucker  72 y.o. female  PRE-OPERATIVE DIAGNOSIS:  End of battery life for deep brain stimulator  POST-OPERATIVE DIAGNOSIS:  End of battery life for deep brain stimulator  PROCEDURE:  Procedure(s): Right Implantable pulse generator change (Right)  SURGEON:  Surgeon(s) and Role:    Erline Levine, MD - Primary  PHYSICIAN ASSISTANT: None  ASSISTANTS: Poteat, RN   ANESTHESIA:   LMA  EBL:  20 mL   BLOOD ADMINISTERED:none  DRAINS: none   LOCAL MEDICATIONS USED:  MARCAINE    and LIDOCAINE   SPECIMEN:  No Specimen  DISPOSITION OF SPECIMEN:  N/A  COUNTS:  YES  TOURNIQUET:  * No tourniquets in log *  DICTATION: DICTATION: Patient has implanted subthalamic stimulator electrodes and IPG, which is now depleted.  It was elected for patient to undergo IPG revision.  PROCEDURE: Patient was brought to the operating room and given intravenous sedation.  Right upper chest was prepped with betadine scrub and Duraprep.  Area of planned incision was infiltrated with lidocaine.  Prior incision was reopened and the old IPG was externalized.  Adaptor was reconnected to new IPG which was placed in the pocket.  Wound was irrigated with vancomycin. Then irrigated once more.  Incision was closed with 2-0 Vicryl and 3-0 vicryl sutures and dressed with Dermabond and a sterile occlusive dressing.  Counts were correct at the end of the case.  PLAN OF CARE: Discharge to home after PACU  PATIENT DISPOSITION:  PACU - hemodynamically stable.   Delay start of Pharmacological VTE agent (>24hrs) due to surgical blood loss or risk of bleeding: yes

## 2018-03-30 NOTE — Interval H&P Note (Signed)
History and Physical Interval Note:  03/30/2018 11:48 AM  Courtney Tucker  has presented today for surgery, with the diagnosis of End of battery life for deep brain stimulator  The various methods of treatment have been discussed with the patient and family. After consideration of risks, benefits and other options for treatment, the patient has consented to  Procedure(s) with comments: Right Implantable pulse generator change (Right) - Right Implantable pulse generator change as a surgical intervention .  The patient's history has been reviewed, patient examined, no change in status, stable for surgery.  I have reviewed the patient's chart and labs.  Questions were answered to the patient's satisfaction.     Peggyann Shoals

## 2018-03-31 ENCOUNTER — Encounter (HOSPITAL_COMMUNITY): Payer: Self-pay | Admitting: Neurosurgery

## 2018-04-13 ENCOUNTER — Telehealth: Payer: Self-pay | Admitting: Neurology

## 2018-04-13 DIAGNOSIS — G20 Parkinson's disease: Secondary | ICD-10-CM

## 2018-04-13 NOTE — Telephone Encounter (Signed)
Okay to write

## 2018-04-13 NOTE — Telephone Encounter (Signed)
ok 

## 2018-04-13 NOTE — Telephone Encounter (Signed)
PT is calling in stating the patient needs an order for a 3-in-1 commode. Please fax this over to [facility]. If you need to call the patient it's [phone] or the PT is [phone]. Thanks!

## 2018-04-13 NOTE — Telephone Encounter (Signed)
Order faxed to [facility] at [phone].

## 2018-04-14 ENCOUNTER — Telehealth: Payer: Self-pay | Admitting: Neurology

## 2018-04-14 NOTE — Telephone Encounter (Signed)
Dr. Carles Collet - please advise.

## 2018-04-14 NOTE — Telephone Encounter (Signed)
Dr.Stern's office called [facility]) wanting the patient's March Appt moved up because she just had surgery and is needing to be seen. Is there a certain work in spot she needs to be in? Let me know and I can call the daughter to confirm appt. Thanks!

## 2018-04-15 NOTE — Telephone Encounter (Signed)
Left message on machine for patient to call back.  To let her know she doesn't need a sooner appt for device, as it is set.

## 2018-04-15 NOTE — Telephone Encounter (Signed)
I don't need to change her stimulator.  I got a message from dusty after they replaced her battery that he set her back up as I had previously set her.  Is there something that the patient is concerned about?  My biggest concern for her is the depression and this isn't something that I treat in her and relates to her complex living/home situation.

## 2018-06-11 ENCOUNTER — Telehealth: Payer: Self-pay | Admitting: Neurology

## 2018-06-11 NOTE — Telephone Encounter (Signed)
[name] called needing to request a Sooner appointment for this patient. She said that her Stimulator needs turning on, that she is having more problems speaking. Please Call. Thanks

## 2018-06-14 NOTE — Telephone Encounter (Signed)
Spoke with Arsenio Loader, physical therapist with [name], and discussed patient. Made her aware DBS was turned on and reset after battery replacement (patient/daughter keep telling her it is completely off). Made her aware this will not help speech. Patient did complete ST, but is not utilizing what she was taught in therapy. PT states patient has good days/bad days and will sometimes walk for her but admits bad days usually seem anxiety related. Made her aware we will keep her appt in March for follow up of DBS.

## 2018-08-13 ENCOUNTER — Telehealth: Payer: Self-pay | Admitting: Neurology

## 2018-08-13 NOTE — Telephone Encounter (Signed)
What makes them think that DBS needs turned "up?"  She often says that but when she gets in here I have never found that is the case.  Her speech and balance is the biggest issue.  I have never found her to be rigid.

## 2018-08-13 NOTE — Telephone Encounter (Signed)
Daughter called back and spoke with After hours nurse. She had mentioned in previous call that her mom was shaking more and that's why she felt she needed to be seen or have her DBS adjusted. Thanks

## 2018-08-13 NOTE — Telephone Encounter (Signed)
My assumption is this is going to be a non-urgent appt and will have to wait to be scheduled. Dr. Carles Collet - please confirm.

## 2018-08-13 NOTE — Telephone Encounter (Signed)
LMOM letting Dawn know that we could not bring them in for a visit at this time.

## 2018-08-13 NOTE — Telephone Encounter (Signed)
Let pt/daughter know that we are only seeing urgents/emergencies right now via [place].  We will have to r/s when [place] changes policy when safer for her to get out.

## 2018-08-13 NOTE — Telephone Encounter (Signed)
Left message on machine for patient/daughter to call back.

## 2018-08-13 NOTE — Telephone Encounter (Signed)
Patient's daughter called regarding her mom needing to be seen and have her DBS turned up? I called to reschedule her appt but she feels she needs to be seen. Please Advise. Thanks

## 2018-08-16 ENCOUNTER — Ambulatory Visit: Payer: Self-pay | Admitting: Neurology

## 2018-08-17 ENCOUNTER — Encounter: Payer: Self-pay | Admitting: Neurology

## 2018-09-19 ENCOUNTER — Other Ambulatory Visit: Payer: Self-pay | Admitting: Neurology

## 2018-10-15 ENCOUNTER — Ambulatory Visit: Payer: Medicare Other | Admitting: Neurology

## 2018-10-15 ENCOUNTER — Telehealth: Payer: Self-pay | Admitting: Neurology

## 2018-10-15 NOTE — Telephone Encounter (Signed)
Pt/daughter requested in person visit and had appt today and then cx the visit, stating could not come.

## 2018-10-19 ENCOUNTER — Telehealth: Payer: Self-pay | Admitting: Neurology

## 2018-10-19 ENCOUNTER — Other Ambulatory Visit: Payer: Self-pay | Admitting: Neurology

## 2018-10-19 NOTE — Telephone Encounter (Signed)
Not sure what device needed replaced battery. Called for additional information.  No answer  Are you aware of what she is talking about?

## 2018-10-19 NOTE — Telephone Encounter (Signed)
Requested Prescriptions   Pending Prescriptions Disp Refills  . NUEDEXTA 20-10 MG capsule [Pharmacy Med Name: NUEDEXTA 20-10 MG CAPSULE] 60 capsule 5    Sig: TAKE 1 CAPSULE BY MOUTH TWICE A DAY   Rx last filled: Rx not on current medication list.   Pt last seen: 03/12/18  Follow up appt scheduled: 01/11/19   ASSESSMENT/PLAN:  1.  Idiopathic Parkinson's disease.             -The patient is status post DBS placement in the bilateral STN at the end of 2007.  She had a battery replaced on 08/05/13 and 11/26/15.  Battery needs replacement.  Will send referral             -continue carbidopa/levodopa 25/100 tid.             -continue pramipexole, 0.5 mg q hs             -in maintenance PT now             -asks about PT; not sure insurance will pay since just finished but will send referral to home health 2.  pseudobulbar affect, with pathologic crying.             -unable to afford nuedexta  Please advise pt has no show her last 3 appts. This Rx was addressed during last visit but able to afford it.

## 2018-10-19 NOTE — Telephone Encounter (Signed)
Pt had an appointment scheduled with me last week (and was worked in at the request of her daughter) but her daughter cancelled that appoinment.   I am aware that she had a battery change and her battery settings were put back where I previously had them set at, which is appropriate.  We have discussed many times that turning up the DBS is going to make speech worse.  They need to make sure that they keep appointments as in office appointments are very, very limited right now during the pandemic.  At this point, the earliest appointment is where she is now scheduled in august.

## 2018-10-19 NOTE — Telephone Encounter (Signed)
Daughter wanted you to know that they replaced the battery but haven't turned it up, it is currently all the way down. She just want you to know incase there is a way she can turn it up. The patient is slow in movement and speech. Thanks!

## 2018-10-19 NOTE — Telephone Encounter (Signed)
noted 

## 2018-10-25 ENCOUNTER — Telehealth: Payer: Self-pay | Admitting: Neurology

## 2018-10-25 NOTE — Telephone Encounter (Signed)
Please advise 

## 2018-10-25 NOTE — Telephone Encounter (Signed)
I would advise going to the ER if that is new.  If not new, then she should f/u with her eye doctor.

## 2018-10-25 NOTE — Telephone Encounter (Signed)
Pt's caregiver Tiffany calling to inform that pt has been having double vision since yesterday. She is not sue if it is due to a medication that pt is taking or stress related. She would like some advise.

## 2018-10-26 NOTE — Telephone Encounter (Signed)
Called spoke with patient daughter Priseis Cratty she states that she is on her way home now from [place] she will speak with Tiffany to make sure her mother is ok she will contact eye doctor for appt when she arrives home.

## 2018-11-02 ENCOUNTER — Ambulatory Visit: Payer: Medicare Other | Admitting: Neurology

## 2018-11-11 ENCOUNTER — Encounter: Payer: Self-pay | Admitting: Gastroenterology

## 2018-12-30 ENCOUNTER — Telehealth: Payer: Self-pay | Admitting: Neurology

## 2018-12-30 NOTE — Telephone Encounter (Signed)
Spoke with Dawn ([facility]) daughter she was informed of provider response

## 2018-12-30 NOTE — Telephone Encounter (Signed)
Patient would like a referral to [facility]  Fax @ [phone] she canceled her follow up with Dr Tat

## 2018-12-30 NOTE — Telephone Encounter (Signed)
She will need to get that from her PCP if she would like to transfer care to [facility].

## 2019-01-03 ENCOUNTER — Encounter: Payer: Self-pay | Admitting: Neurology

## 2019-01-05 ENCOUNTER — Telehealth: Payer: Self-pay | Admitting: Neurology

## 2019-01-05 NOTE — Telephone Encounter (Signed)
Patient dismissed from [facility]  by Wells Guiles Tat, DO, effective 01/03/19. Dismissal Letter sent out by 1st class mail. [employer]

## 2019-01-11 ENCOUNTER — Encounter: Payer: Self-pay | Admitting: Neurology

## 2019-01-11 ENCOUNTER — Encounter: Payer: Medicare Other | Admitting: Neurology

## 2019-03-20 ENCOUNTER — Emergency Department (HOSPITAL_COMMUNITY): Payer: Medicare Other

## 2019-03-20 ENCOUNTER — Other Ambulatory Visit: Payer: Self-pay

## 2019-03-20 ENCOUNTER — Emergency Department (HOSPITAL_COMMUNITY)
Admission: EM | Admit: 2019-03-20 | Discharge: 2019-03-20 | Disposition: A | Discharge status: Home / Self Care | Payer: Medicare Other | Attending: Emergency Medicine | Admitting: Emergency Medicine

## 2019-03-20 ENCOUNTER — Encounter (HOSPITAL_COMMUNITY): Payer: Self-pay | Admitting: Emergency Medicine

## 2019-03-20 DIAGNOSIS — M25552 Pain in left hip: Secondary | ICD-10-CM | POA: Diagnosis not present

## 2019-03-20 DIAGNOSIS — W050XXA Fall from non-moving wheelchair, initial encounter: Secondary | ICD-10-CM | POA: Insufficient documentation

## 2019-03-20 DIAGNOSIS — M79662 Pain in left lower leg: Secondary | ICD-10-CM | POA: Diagnosis present

## 2019-03-20 DIAGNOSIS — I10 Essential (primary) hypertension: Secondary | ICD-10-CM | POA: Diagnosis not present

## 2019-03-20 DIAGNOSIS — Y9389 Activity, other specified: Secondary | ICD-10-CM | POA: Diagnosis not present

## 2019-03-20 DIAGNOSIS — S81812A Laceration without foreign body, left lower leg, initial encounter: Secondary | ICD-10-CM | POA: Insufficient documentation

## 2019-03-20 DIAGNOSIS — M25562 Pain in left knee: Secondary | ICD-10-CM | POA: Diagnosis not present

## 2019-03-20 DIAGNOSIS — M545 Low back pain: Secondary | ICD-10-CM | POA: Diagnosis not present

## 2019-03-20 DIAGNOSIS — Y999 Unspecified external cause status: Secondary | ICD-10-CM | POA: Diagnosis not present

## 2019-03-20 DIAGNOSIS — Y92122 Bedroom in nursing home as the place of occurrence of the external cause: Secondary | ICD-10-CM | POA: Diagnosis not present

## 2019-03-20 DIAGNOSIS — Z79899 Other long term (current) drug therapy: Secondary | ICD-10-CM | POA: Diagnosis not present

## 2019-03-20 DIAGNOSIS — F17210 Nicotine dependence, cigarettes, uncomplicated: Secondary | ICD-10-CM | POA: Diagnosis not present

## 2019-03-20 DIAGNOSIS — G20 Parkinson's disease: Secondary | ICD-10-CM | POA: Insufficient documentation

## 2019-03-20 NOTE — ED Notes (Signed)
Gave report to Julianne Rice at [facility]

## 2019-03-20 NOTE — ED Provider Notes (Signed)
[facility] EMERGENCY DEPARTMENT Provider Note   CSN: [id number] Arrival date & time: 03/20/19  1039     History   Chief Complaint Chief Complaint  Patient presents with   Fall    HPI Courtney Tucker is a 73 y.o. female.       Courtney Tucker is a 73 y.o. female past medical history of hypertension, hyperlipidemia, Parkinson's and TIA, who resides at [facility],  presents to the Emergency Department for evaluation after a near fall from her wheelchair. Patient states she fell from her wheelchair this morning.  She complains of left lower leg, tailbone and knee pain.  Caregiver from the facility was contacted to obtain history information.  She states that patient was sitting in her wheelchair and reaching into the closet to get something and twisted her ankle nearly falling out of the chair.  Caregiver states there was no fall onto the floor or head injury.  Patient came to the nursing facility 2 days ago and was believed to be a placement from a DSS evaluation. Pt denies shortness of breath, neck pain, chest pain or head injury.      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/13/2015   Parkinson's disease ([place]) 02/23/2015   Encounter for screening colonoscopy 11/14/2013   Dysphagia, unspecified(787.20) 11/14/2013   Parkinson disease ([place]) 08/05/2013    Depression, major 07/25/2013   Anxiety 12/30/2012   Paralysis agitans ([place]) 10/15/2012   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   Abnormality of gait 10/15/2012   Dysphasia 10/15/2012   HYPERLIPIDEMIA-MIXED 02/14/2009   HYPERTENSION, UNSPECIFIED 02/14/2009   DYSPNEA 02/14/2009   CHEST PAIN-UNSPECIFIED 02/14/2009    Past Surgical History:  Procedure Laterality Date   BACK SURGERY     Battery change     DBS, 12/2009   BIOPSY N/A 11/29/2013   Procedure: GASTRIC BIOPSY;  Surgeon: Danie Binder, MD;  Location: [facility];  Service: Endoscopy;  Laterality: N/A;   BURR HOLE W/ STEREOTACTIC INSERTION OF DBS LEADS / INTRAOP MICROELECTRODE RECORDING     2007   CARPAL TUNNEL RELEASE Right    COLONOSCOPY WITH PROPOFOL N/A 11/29/2013   Procedure: COLONOSCOPY WITH PROPOFOL;  Surgeon: Danie Binder, MD;  Location: [facility];  Service: Endoscopy;  Laterality: N/A;  entered cecum @ [phone]; total cecal withdrawal time 21 minutes    ELBOW SURGERY Left    after fx   ESOPHAGEAL DILATION N/A 11/29/2013   Procedure: ESOPHAGEAL DILATION;  Surgeon: Danie Binder, MD;  Location: [facility];  Service: Endoscopy;  Laterality: N/A;  [place] 14/15/16   ESOPHAGOGASTRODUODENOSCOPY (EGD) WITH PROPOFOL N/A 11/29/2013   Procedure: ESOPHAGOGASTRODUODENOSCOPY (EGD) WITH PROPOFOL;  Surgeon: Danie Binder, MD;  Location: [facility];  Service: Endoscopy;  Laterality: N/A;   FOOT SURGERY Left    "something with 2nd 2."   NECK SURGERY     Fusion   POLYPECTOMY N/A 11/29/2013   Procedure: POLYPECTOMY;  Surgeon: Danie Binder, MD;  Location: [facility];  Service: Endoscopy;  Laterality: N/A;  Distal Transverse Colon x2    PR ANALYZE NEUROSTIM BRAIN, FIRST 1H  10/15/2012       PULSE GENERATOR IMPLANT Right 03/30/2018   Procedure: Right Implantable pulse generator change;  Surgeon: Erline Levine, MD;  Location: [place];  Service: Neurosurgery;  Laterality: Right;   SUBTHALAMIC STIMULATOR BATTERY REPLACEMENT N/A 08/05/2013   Procedure:  SUBTHALAMIC STIMULATOR BATTERY REPLACEMENT;  Surgeon: Erline Levine, MD;  Location: [place] [facility];  Service: Neurosurgery;  Laterality: N/A;   SUBTHALAMIC STIMULATOR BATTERY REPLACEMENT N/A 11/26/2015   Procedure: Implantable pulse generator battery change for deep brain stimulator;  Surgeon: Erline Levine, MD;  Location: [place] [facility];  Service: Neurosurgery;  Laterality: N/A;   VAGINAL HYSTERECTOMY     "partial"   WRIST SURGERY Right    after fx     OB History   No obstetric history on file.      Home Medications    Prior to Admission medications   Medication Sig Start Date End Date Taking? Authorizing Provider  ALPRAZolam Duanne Moron) 0.5 MG tablet Take 0.5 mg by mouth 2 (two) times daily as needed for anxiety.     [provider]  amLODipine (NORVASC) 10 MG tablet Take 10 mg by mouth daily.     [provider]  Carbidopa-Levodopa ER (SINEMET CR) 25-100 MG tablet controlled release TAKE 1 TABLET BY MOUTH THREE TIMES A DAY 09/20/18   Tat, Eustace Quail, DO  DULoxetine (CYMBALTA) 60 MG capsule Take 120 mg by mouth daily.     [provider]  losartan (COZAAR) 100 MG tablet Take 100 mg by mouth daily.    [provider]  Multiple Vitamin (MULTIVITAMIN WITH MINERALS) TABS tablet Take 1 tablet by mouth daily.    [provider]  omeprazole (PRILOSEC) 20 MG capsule Take 20 mg by mouth daily as needed (heartburn).     [provider]  polyethylene glycol (MIRALAX / GLYCOLAX) packet Take 17 g by mouth daily as needed for moderate constipation.     [provider]  pramipexole (MIRAPEX) 0.5 MG tablet Take 0.5-1 mg by mouth at bedtime.  06/18/16   [provider]    Family History Family History  Problem Relation Age of Onset   Colon cancer Paternal Uncle     Social History Social History   Tobacco Use   Smoking status: Current Every Day Smoker    Packs/day: 0.50    Years: 25.00    Pack years: 12.50    Types: Cigarettes     Smokeless tobacco: Never Used  Substance Use Topics   Alcohol use: Yes    Comment: 1/2 glass wine rarely   Drug use: No     Allergies   Oxycodone, Aspirin, and Codeine   Review of Systems Review of Systems  Constitutional: Negative for fever.  Cardiovascular: Negative for chest pain.  Gastrointestinal: Negative for abdominal pain, nausea and vomiting.  Musculoskeletal: Positive for arthralgias (left knee and lower leg pain). Negative for joint swelling.  Skin: Negative for color change.  Neurological: Negative for dizziness and syncope.     Physical Exam Updated Vital Signs BP (!) 133/59    Pulse 71    Temp 97.8 F (36.6 C) (  Oral)    Resp 17    SpO2 95%   Physical Exam Vitals signs and nursing note reviewed.  Constitutional:      Comments: Patient is frail and elderly appearing.   HENT:     Head: Atraumatic.  Neck:     Musculoskeletal: Normal range of motion. No muscular tenderness.  Cardiovascular:     Rate and Rhythm: Normal rate and regular rhythm.     Pulses: Normal pulses.  Pulmonary:     Effort: Pulmonary effort is normal.     Breath sounds: Normal breath sounds.  Chest:     Chest wall: No tenderness.  Abdominal:     General: There is no distension.     Palpations: Abdomen is soft.     Tenderness: There is no abdominal tenderness. There is no guarding.  Skin:    General: Skin is warm.     Capillary Refill: Capillary refill takes less than 2 seconds.     Comments: 2.5 cm skin tear to the anterior left lower leg.  No hematoma, no active bleeding.  No abrasions  Neurological:     Mental Status: She is alert and oriented to person, place, and time.     Sensory: No sensory deficit.     Motor: Motor function is intact.     Comments: CN II-XII grossly intact.  Speech is slow, but appropriate.   Speaks in whispers only.        ED Treatments / Results  Labs (all labs ordered are listed, but only abnormal results are displayed) Labs Reviewed - No data  to display  EKG None  Radiology Dg Lumbar Spine Complete  Result Date: 03/20/2019 CLINICAL DATA:  Fall, pain EXAM: LUMBAR SPINE - COMPLETE 4+ VIEW COMPARISON:  07/08/2016 lumbar spine radiograph FINDINGS: This report assumes 5 non rib-bearing lumbar vertebrae. Slight dextrocurvature of the lumbar spine. Status post bilateral posterior spinal fixation at L4-5 with bilateral pedicle screws and bone cage in the L4-5 disc space, with no evidence of hardware fracture or loosening. Lumbar vertebral body heights are preserved, with no fracture. Severe multilevel lumbar degenerative disc disease, most prominent at L1-2 and L2-3, worsened. Stable 4 mm retrolisthesis at L1-2 and L2-3. Moderate bilateral lumbar facet arthropathy. No aggressive appearing focal osseous lesions. Surgical clips overlie the right upper quadrant. IMPRESSION: 1. No lumbar spine fracture. 2. Status post bilateral posterior spinal fixation at L4-5, with no hardware complication. 3. Severe multilevel lumbar degenerative disc disease, worsened. 4. Stable multilevel lumbar spondylolisthesis. Electronically Signed   By: Ilona Sorrel M.D.   On: 03/20/2019 13:20   Dg Ankle Complete Left  Result Date: 03/20/2019 CLINICAL DATA:  Fall, left ankle pain EXAM: LEFT ANKLE COMPLETE - 3+ VIEW COMPARISON:  None. FINDINGS: Generalized soft tissue swelling. No fracture or subluxation no suspicious focal osseous lesions. Tiny Achilles left calcaneal spur. No radiopaque foreign body. IMPRESSION: Generalized soft tissue swelling in the left ankle. No fracture or subluxation in the left ankle. Electronically Signed   By: Ilona Sorrel M.D.   On: 03/20/2019 13:16   Dg Knee Complete 4 Views Left  Result Date: 03/20/2019 CLINICAL DATA:  Fall, left knee pain EXAM: LEFT KNEE - COMPLETE 4+ VIEW COMPARISON:  None. FINDINGS: No fracture or dislocation. Suboptimally oblique positioning of the lateral view, precluding accurate assessment for joint effusion. No  suspicious focal osseous lesion. Minimal lateral and patellofemoral compartment osteoarthritis. No radiopaque foreign body. IMPRESSION: No fracture or dislocation in the left knee. 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 Electronically Signed   By: Ilona Sorrel M.D.   On: 03/20/2019 13:13   Dg Hip Unilat W Or Wo Pelvis 2-3 Views Left  Result Date: 03/20/2019 CLINICAL DATA:  Fall, left hip pain EXAM: DG HIP (WITH OR WITHOUT PELVIS) 2-3V LEFT COMPARISON:  12/30/2012 pelvic radiograph FINDINGS: No pelvic fracture or diastasis. No left hip fracture or dislocation. No suspicious focal osseous lesions. Posterior bilateral fixation hardware in the lower lumbar spine. IMPRESSION: No pelvic fracture. No left hip fracture or dislocation. Electronically Signed   By: Ilona Sorrel M.D.   On: 03/20/2019 13:15    Procedures Procedures (including critical care time)  Medications Ordered in ED Medications - No data to display   Initial Impression / Assessment and Plan / ED Course  I have reviewed the triage vital signs and the nursing notes.  Pertinent labs & imaging results that were available during my care of the patient were reviewed by me and considered in my medical decision making (see chart for details).      pt sent here for evaluation after a near fall from a wheelchair.  Pt complained of left knee, hip pain.  No head injury , LOC per my discussion with her caregiver.  XRays reassuring.    Small skin tear to left lower leg was cleaned and bandaged.  Pt medically screened here and appears appropriate for d/c back to the nursing facility.    Pt also seen by Dr. Sabra Heck     Final Clinical Impressions(s) / ED Diagnoses   Final diagnoses:  Fall from wheelchair, initial encounter    ED Discharge Orders    None       Kem Parkinson, [place] 03/20/19 1419    Noemi Chapel, MD 03/20/19 1456

## 2019-03-20 NOTE — Discharge Instructions (Addendum)
XRays today are negative for any fractures.  Tylenol every 4 hrs if needed for pain.  Follow-up with her doctor

## 2019-03-20 NOTE — ED Triage Notes (Signed)
Pt from [facility]. Pt was in her wheelchair, reaching for something and fell out.  Pt c/o of left hip and left knee pain.  No rotation or shortening noted.  Skin tear to left shin

## 2019-03-20 NOTE — ED Notes (Signed)
[facility] called to transport patient back to facility. 

## 2019-03-29 ENCOUNTER — Emergency Department (HOSPITAL_COMMUNITY): Payer: Medicare Other

## 2019-03-29 ENCOUNTER — Other Ambulatory Visit: Payer: Self-pay

## 2019-03-29 ENCOUNTER — Encounter (HOSPITAL_COMMUNITY): Payer: Self-pay | Admitting: Emergency Medicine

## 2019-03-29 ENCOUNTER — Inpatient Hospital Stay (HOSPITAL_COMMUNITY)
Admission: EM | Admit: 2019-03-29 | Discharge: 2019-04-04 | DRG: 069 | Disposition: A | Discharge status: Skilled Nursing Facility | Payer: Medicare Other | Attending: Internal Medicine | Admitting: Internal Medicine

## 2019-03-29 DIAGNOSIS — I10 Essential (primary) hypertension: Secondary | ICD-10-CM | POA: Diagnosis present

## 2019-03-29 DIAGNOSIS — I69322 Dysarthria following cerebral infarction: Secondary | ICD-10-CM

## 2019-03-29 DIAGNOSIS — Z9682 Presence of neurostimulator: Secondary | ICD-10-CM

## 2019-03-29 DIAGNOSIS — K5900 Constipation, unspecified: Secondary | ICD-10-CM | POA: Diagnosis present

## 2019-03-29 DIAGNOSIS — Z800 Family history of malignant neoplasm of digestive organs: Secondary | ICD-10-CM

## 2019-03-29 DIAGNOSIS — Z885 Allergy status to narcotic agent status: Secondary | ICD-10-CM

## 2019-03-29 DIAGNOSIS — F17210 Nicotine dependence, cigarettes, uncomplicated: Secondary | ICD-10-CM | POA: Diagnosis present

## 2019-03-29 DIAGNOSIS — G4730 Sleep apnea, unspecified: Secondary | ICD-10-CM | POA: Diagnosis present

## 2019-03-29 DIAGNOSIS — G20A1 Parkinson's disease without dyskinesia, without mention of fluctuations: Secondary | ICD-10-CM | POA: Diagnosis present

## 2019-03-29 DIAGNOSIS — L03116 Cellulitis of left lower limb: Secondary | ICD-10-CM | POA: Diagnosis not present

## 2019-03-29 DIAGNOSIS — Z886 Allergy status to analgesic agent status: Secondary | ICD-10-CM

## 2019-03-29 DIAGNOSIS — F482 Pseudobulbar affect: Secondary | ICD-10-CM | POA: Diagnosis present

## 2019-03-29 DIAGNOSIS — I69354 Hemiplegia and hemiparesis following cerebral infarction affecting left non-dominant side: Secondary | ICD-10-CM

## 2019-03-29 DIAGNOSIS — F419 Anxiety disorder, unspecified: Secondary | ICD-10-CM | POA: Diagnosis present

## 2019-03-29 DIAGNOSIS — I69321 Dysphasia following cerebral infarction: Secondary | ICD-10-CM

## 2019-03-29 DIAGNOSIS — E785 Hyperlipidemia, unspecified: Secondary | ICD-10-CM | POA: Diagnosis present

## 2019-03-29 DIAGNOSIS — R55 Syncope and collapse: Secondary | ICD-10-CM | POA: Diagnosis present

## 2019-03-29 DIAGNOSIS — M1990 Unspecified osteoarthritis, unspecified site: Secondary | ICD-10-CM | POA: Diagnosis present

## 2019-03-29 DIAGNOSIS — G20 Parkinson's disease: Secondary | ICD-10-CM | POA: Diagnosis present

## 2019-03-29 DIAGNOSIS — G459 Transient cerebral ischemic attack, unspecified: Principal | ICD-10-CM | POA: Diagnosis present

## 2019-03-29 DIAGNOSIS — Z20828 Contact with and (suspected) exposure to other viral communicable diseases: Secondary | ICD-10-CM | POA: Diagnosis present

## 2019-03-29 DIAGNOSIS — K219 Gastro-esophageal reflux disease without esophagitis: Secondary | ICD-10-CM | POA: Diagnosis present

## 2019-03-29 DIAGNOSIS — F339 Major depressive disorder, recurrent, unspecified: Secondary | ICD-10-CM | POA: Diagnosis present

## 2019-03-29 DIAGNOSIS — F329 Major depressive disorder, single episode, unspecified: Secondary | ICD-10-CM | POA: Diagnosis present

## 2019-03-29 DIAGNOSIS — Z9689 Presence of other specified functional implants: Secondary | ICD-10-CM

## 2019-03-29 DIAGNOSIS — M79601 Pain in right arm: Secondary | ICD-10-CM | POA: Diagnosis not present

## 2019-03-29 DIAGNOSIS — R400 Somnolence: Secondary | ICD-10-CM

## 2019-03-29 LAB — URINALYSIS, COMPLETE (UACMP) WITH MICROSCOPIC
Bacteria, UA: NONE SEEN
Bilirubin Urine: NEGATIVE
Glucose, UA: NEGATIVE mg/dL
Hgb urine dipstick: NEGATIVE
Ketones, ur: NEGATIVE mg/dL
Leukocytes,Ua: NEGATIVE
Nitrite: NEGATIVE
Protein, ur: NEGATIVE mg/dL
Specific Gravity, Urine: 1.018 (ref 1.005–1.030)
pH: 5 (ref 5.0–8.0)

## 2019-03-29 LAB — COMPREHENSIVE METABOLIC PANEL
ALT: 45 U/L — ABNORMAL HIGH (ref 0–44)
AST: 25 U/L (ref 15–41)
Albumin: 4.2 g/dL (ref 3.5–5.0)
Alkaline Phosphatase: 161 U/L — ABNORMAL HIGH (ref 38–126)
Anion gap: 8 (ref 5–15)
BUN: 26 mg/dL — ABNORMAL HIGH (ref 8–23)
CO2: 27 mmol/L (ref 22–32)
Calcium: 9.4 mg/dL (ref 8.9–10.3)
Chloride: 103 mmol/L (ref 98–111)
Creatinine, Ser: 0.97 mg/dL (ref 0.44–1.00)
GFR calc Af Amer: >60 mL/min (ref >60)
GFR calc non Af Amer: 58 mL/min — ABNORMAL LOW (ref >60)
Glucose, Bld: 103 mg/dL — ABNORMAL HIGH (ref 70–99)
Potassium: 4 mmol/L (ref 3.5–5.1)
Sodium: 138 mmol/L (ref 135–145)
Total Bilirubin: 0.3 mg/dL (ref 0.3–1.2)
Total Protein: 7.5 g/dL (ref 6.5–8.1)

## 2019-03-29 LAB — CBC WITH DIFFERENTIAL/PLATELET
Abs Immature Granulocytes: 0.01 10*3/uL (ref 0.00–0.07)
Basophils Absolute: 0 10*3/uL (ref 0.0–0.1)
Basophils Relative: 1 %
Eosinophils Absolute: 0.3 10*3/uL (ref 0.0–0.5)
Eosinophils Relative: 4 %
HCT: 42.7 % (ref 36.0–46.0)
Hemoglobin: 14.1 g/dL (ref 12.0–15.0)
Immature Granulocytes: 0 %
Lymphocytes Relative: 33 %
Lymphs Abs: 2 10*3/uL (ref 0.7–4.0)
MCH: 31.9 pg (ref 26.0–34.0)
MCHC: 33 g/dL (ref 30.0–36.0)
MCV: 96.6 fL (ref 80.0–100.0)
Monocytes Absolute: 0.7 10*3/uL (ref 0.1–1.0)
Monocytes Relative: 11 %
Neutro Abs: 3.1 10*3/uL (ref 1.7–7.7)
Neutrophils Relative %: 51 %
Platelets: 258 10*3/uL (ref 150–400)
RBC: 4.42 MIL/uL (ref 3.87–5.11)
RDW: 12.1 % (ref 11.5–15.5)
WBC: 6.1 10*3/uL (ref 4.0–10.5)
nRBC: 0 % (ref 0.0–0.2)

## 2019-03-29 LAB — ETHANOL: Alcohol, Ethyl (B): <10 mg/dL (ref <10)

## 2019-03-29 LAB — RAPID URINE DRUG SCREEN, HOSP PERFORMED
Amphetamines: NOT DETECTED
Barbiturates: NOT DETECTED
Benzodiazepines: POSITIVE — AB
Cocaine: NOT DETECTED
Opiates: NOT DETECTED
Tetrahydrocannabinol: NOT DETECTED

## 2019-03-29 LAB — AMMONIA: Ammonia: 15 umol/L (ref 9–35)

## 2019-03-29 LAB — LACTIC ACID, PLASMA: Lactic Acid, Venous: 1.3 mmol/L (ref 0.5–1.9)

## 2019-03-29 MED ORDER — VANCOMYCIN HCL IN DEXTROSE 750-5 MG/150ML-% IV SOLN: 750.0000 mg | INTRAVENOUS | Status: DC

## 2019-03-29 MED ORDER — OXYBUTYNIN CHLORIDE 5 MG PO TABS
5.0000 mg | ORAL_TABLET | Freq: Two times a day (BID) | ORAL | Status: DC
  Administered 2019-03-29 – 2019-04-04 (×12): 5 mg via ORAL
  Filled 2019-03-29 (×12): qty 1

## 2019-03-29 MED ORDER — IOHEXOL 300 MG/ML  SOLN
100.0000 mL | Freq: Once | INTRAMUSCULAR | Status: AC | PRN
  Administered 2019-03-29: 100 mL via INTRAVENOUS

## 2019-03-29 MED ORDER — CLOPIDOGREL BISULFATE 75 MG PO TABS
75.0000 mg | ORAL_TABLET | Freq: Every day | ORAL | Status: DC
  Administered 2019-03-30 – 2019-04-04 (×6): 75 mg via ORAL
  Filled 2019-03-29 (×6): qty 1

## 2019-03-29 MED ORDER — PRAMIPEXOLE DIHYDROCHLORIDE 1 MG PO TABS
1.5000 mg | ORAL_TABLET | Freq: Every day | ORAL | Status: DC
  Administered 2019-03-29 – 2019-04-03 (×6): 1.5 mg via ORAL
  Filled 2019-03-29 (×5): qty 2

## 2019-03-29 MED ORDER — CARBIDOPA-LEVODOPA ER 25-100 MG PO TBCR
1.0000 | EXTENDED_RELEASE_TABLET | Freq: Three times a day (TID) | ORAL | Status: DC
  Administered 2019-03-29 – 2019-04-04 (×15): 1 via ORAL
  Filled 2019-03-29 (×17): qty 1

## 2019-03-29 MED ORDER — ENOXAPARIN SODIUM 40 MG/0.4ML ~~LOC~~ SOLN
40.0000 mg | Freq: Every day | SUBCUTANEOUS | Status: DC
  Administered 2019-03-29 – 2019-04-03 (×6): 40 mg via SUBCUTANEOUS
  Filled 2019-03-29 (×6): qty 0.4

## 2019-03-29 MED ORDER — VANCOMYCIN HCL 1.25 G IV SOLR
1250.0000 mg | Freq: Once | INTRAVENOUS | Status: AC
  Administered 2019-03-29: 1250 mg via INTRAVENOUS
  Filled 2019-03-29: qty 1250

## 2019-03-29 MED ORDER — DULOXETINE HCL 60 MG PO CPEP
60.0000 mg | ORAL_CAPSULE | Freq: Two times a day (BID) | ORAL | Status: DC
  Administered 2019-03-29 – 2019-03-30 (×2): 60 mg via ORAL
  Filled 2019-03-29 (×2): qty 1

## 2019-03-29 MED ORDER — TRAZODONE HCL 50 MG PO TABS
100.0000 mg | ORAL_TABLET | Freq: Every day | ORAL | Status: DC
  Administered 2019-03-29: 100 mg via ORAL
  Filled 2019-03-29: qty 2

## 2019-03-29 MED ORDER — ACETAMINOPHEN 500 MG PO TABS
500.0000 mg | ORAL_TABLET | Freq: Four times a day (QID) | ORAL | Status: DC | PRN
  Administered 2019-03-30 – 2019-04-03 (×4): 500 mg via ORAL
  Filled 2019-03-29 (×4): qty 1

## 2019-03-29 NOTE — ED Triage Notes (Signed)
Pt from [facility] and says she can't move her right arm. The patient is very groggy during triage and incompressible. Ems reports being called out for right arm and rib pain. The patient told EMS on their arrival that she had a syncopal episode today but denies hitting her head. She fell two weeks ago and was seen at [facility].

## 2019-03-29 NOTE — ED Notes (Signed)
Pt states she is incontinent, pure wick placed for urine collection, MD notified

## 2019-03-29 NOTE — ED Provider Notes (Signed)
Medical screening examination/treatment/procedure(s) were conducted as a shared visit with non-physician practitioner(s) and myself.  I personally evaluated the patient during the encounter.  Clinical Impression:   Final diagnoses:  TIA (transient ischemic attack)      Has ttp with squeezing the forearm and over the ribs - and RUQ - no rashes present - no bruising - no trauma - ? Syncopal episode today - unwitnessed - no hitting head - some slight change in mental status - labs and imaging.  Speech is baseline - she states she passed out but can't give me any history regarding events.  She is well appearin g- normal VS and normal EKG.  Family member comes in now states that the arm is actually weak and not in pain, I do not detect this on my exam but due to fluctuating neurologic symptoms will be admitted for this   Noemi Chapel, MD 03/30/19 1456

## 2019-03-29 NOTE — ED Provider Notes (Signed)
[facility] EMERGENCY DEPARTMENT Provider Note   CSN: [id number] Arrival date & time: 03/29/19  1356     History   Chief Complaint Chief Complaint  Patient presents with  . Arm Pain    HPI Courtney Tucker is a 73 y.o. female who  has a past medical history of Anxiety, Arthritis, Complication of anesthesia, Depression, Dysphasia, GERD (gastroesophageal reflux disease), Hyperlipidemia, Hypertension, Parkinson's disease ([place]), PONV (postoperative nausea and vomiting), Reflux, Sleep apnea, TIA (transient ischemic attack), and UTI (lower urinary tract infection). She presents with R arm pain and syncope. The patient is very somnolent and has a hx of dysarthria which limits hx and makes history intake very difficult. The paitent states that she lost consciousness while sitting in her bed earlier today.  She complains of aching pain in her right forearm.  She states that it just began this morning.  She denies any injuries to the area.  She denies pain in her shoulder or chest.  Patient is extremely sleepy and is unaware of why she is so somnolent.  She states the only thing that she is taken today is Tylenol.  Review of the patient's MAR shows that her alprazolam was discontinued on the 31st.  She takes trazodone but her last pill was given at 8 PM last night.  She is also complaining of pain on the right chest wall and right abdomen.  She denies nausea vomiting or diarrhea.  She states that her speech is at baseline.  She denies any recent falls.     HPI  Past Medical History:  Diagnosis Date  . Anxiety   . Arthritis   . Complication of anesthesia   . Depression   . Dysphasia   . GERD (gastroesophageal reflux disease)   . Hyperlipidemia   . Hypertension   . Parkinson's disease ([place])    diagnosed at age 23  . PONV (postoperative nausea and vomiting)    states it has been a long time since getting sick  . Reflux   . Sleep apnea    Has CPAP but doesnt use  . TIA (transient  ischemic attack)    not really TIA but abnormal MRI per pt left sided weakness  . UTI (lower urinary tract infection)     Patient Active Problem List   Diagnosis Date Noted  . 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 Parkinson's disease ([place]) 02/23/2015  . Encounter for screening colonoscopy 11/14/2013  . Dysphagia, unspecified(787.20) 11/14/2013  . Parkinson disease ([place]) 08/05/2013  . Depression, major 07/25/2013  . Anxiety 12/30/2012  . Paralysis agitans ([place]) 10/15/2012  . 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 Abnormality of gait 10/15/2012  . Dysphasia 10/15/2012  . HYPERLIPIDEMIA-MIXED 02/14/2009  . HYPERTENSION, UNSPECIFIED 02/14/2009  . DYSPNEA 02/14/2009  . CHEST PAIN-UNSPECIFIED 02/14/2009    Past Surgical History:  Procedure Laterality Date  . BACK SURGERY    . Battery change     DBS, 12/2009  . BIOPSY N/A 11/29/2013   Procedure: GASTRIC BIOPSY;  Surgeon: Danie Binder, MD;  Location: [facility];  Service: Endoscopy;  Laterality: N/A;  . BURR HOLE W/ STEREOTACTIC INSERTION OF DBS LEADS / INTRAOP MICROELECTRODE RECORDING     2007  . CARPAL TUNNEL RELEASE Right   . COLONOSCOPY WITH PROPOFOL N/A 11/29/2013   Procedure: COLONOSCOPY WITH PROPOFOL;  Surgeon: Danie Binder, MD;  Location: [facility];  Service: Endoscopy;  Laterality: N/A;  entered cecum @ [phone]; total cecal withdrawal time 21 minutes   .  ELBOW SURGERY Left    after fx  . ESOPHAGEAL DILATION N/A 11/29/2013   Procedure: ESOPHAGEAL DILATION;  Surgeon: Danie Binder, MD;  Location: [facility];  Service: Endoscopy;  Laterality: N/A;  [place] 14/15/16  . ESOPHAGOGASTRODUODENOSCOPY (EGD) WITH PROPOFOL N/A 11/29/2013   Procedure: ESOPHAGOGASTRODUODENOSCOPY (EGD) WITH PROPOFOL;  Surgeon: Danie Binder, MD;  Location: [facility];  Service: Endoscopy;  Laterality: N/A;  . FOOT SURGERY Left    "something with 2nd 2."  . NECK SURGERY     Fusion  . POLYPECTOMY N/A 11/29/2013   Procedure: POLYPECTOMY;  Surgeon: Danie Binder, MD;   Location: [facility];  Service: Endoscopy;  Laterality: N/A;  Distal Transverse Colon x2   . PR ANALYZE NEUROSTIM BRAIN, FIRST 1H  10/15/2012      . PULSE GENERATOR IMPLANT Right 03/30/2018   Procedure: Right Implantable pulse generator change;  Surgeon: Erline Levine, MD;  Location: [place];  Service: Neurosurgery;  Laterality: Right;  . SUBTHALAMIC STIMULATOR BATTERY REPLACEMENT N/A 08/05/2013   Procedure: SUBTHALAMIC STIMULATOR BATTERY REPLACEMENT;  Surgeon: Erline Levine, MD;  Location: [place] [facility];  Service: Neurosurgery;  Laterality: N/A;  . SUBTHALAMIC STIMULATOR BATTERY REPLACEMENT N/A 11/26/2015   Procedure: Implantable pulse generator battery change for deep brain stimulator;  Surgeon: Erline Levine, MD;  Location: [place] [facility];  Service: Neurosurgery;  Laterality: N/A;  . VAGINAL HYSTERECTOMY     "partial"  . WRIST SURGERY Right    after fx     OB History   No obstetric history on file.      Home Medications    Prior to Admission medications   Medication Sig Start Date End Date Taking? Authorizing Provider  ALPRAZolam Duanne Moron) 0.5 MG tablet Take 0.5 mg by mouth 2 (two) times daily as needed for anxiety.     [provider]  amLODipine (NORVASC) 10 MG tablet Take 10 mg by mouth daily.     [provider]  Carbidopa-Levodopa ER (SINEMET CR) 25-100 MG tablet controlled release TAKE 1 TABLET BY MOUTH THREE TIMES A DAY 09/20/18   Tat, Eustace Quail, DO  DULoxetine (CYMBALTA) 60 MG capsule Take 120 mg by mouth daily.     [provider]  losartan (COZAAR) 100 MG tablet Take 100 mg by mouth daily.    [provider]  Multiple Vitamin (MULTIVITAMIN WITH MINERALS) TABS tablet Take 1 tablet by mouth daily.    [provider]  omeprazole (PRILOSEC) 20 MG capsule Take 20 mg by mouth daily as needed (heartburn).     [provider]  polyethylene glycol (MIRALAX / GLYCOLAX) packet Take 17 g by mouth daily as needed for moderate constipation.      [provider]  pramipexole (MIRAPEX) 0.5 MG tablet Take 0.5-1 mg by mouth at bedtime.  06/18/16   [provider]    Family History Family History  Problem Relation Age of Onset  . Colon cancer Paternal Uncle     Social History Social History   Tobacco Use  . Smoking status: Current Every Day Smoker    Packs/day: 0.50    Years: 25.00    Pack years: 12.50    Types: Cigarettes  . Smokeless tobacco: Never Used  Substance Use Topics  . Alcohol use: Yes    Comment: 1/2 glass wine rarely  . Drug use: No     Allergies   Oxycodone, Aspirin, and Codeine   Review of Systems Review of Systems Ten systems reviewed and are negative for  acute change, except as noted in the HPI.    Physical Exam Updated Vital Signs BP 125/62 (BP Location: Left Arm)   Pulse 69   Temp 97.9 F (36.6 C) (Oral)   Resp 16   Ht 5\' 2"  (1.575 m)   Wt 67.1 kg   SpO2 96%   BMI 27.07 kg/m   Physical Exam Vitals signs and nursing note reviewed.  Constitutional:      General: She is not in acute distress.    Appearance: She is well-developed. She is not diaphoretic.  HENT:     Head: Normocephalic and atraumatic.  Eyes:     General: No scleral icterus.    Conjunctiva/sclera: Conjunctivae normal.  Neck:     Musculoskeletal: Normal range of motion.  Cardiovascular:     Rate and Rhythm: Normal rate and regular rhythm.     Heart sounds: Normal heart sounds. No murmur. No friction rub. No gallop.   Pulmonary:     Effort: Pulmonary effort is normal. No respiratory distress.     Breath sounds: Normal breath sounds.  Chest:     Chest wall: Tenderness present. No mass, lacerations, deformity, swelling, crepitus or edema. There is no dullness to percussion.    Abdominal:     General: Bowel sounds are normal. There is no distension.     Palpations: Abdomen is soft. There is no mass.     Tenderness: There is abdominal tenderness in the right upper quadrant. There is no guarding.     Musculoskeletal:     Comments: Equal 2+ radial pulses bilaterally. RUE exam Inspection- No erythema, swelling, atrophy, hypertrophy, abrasions, or lacerations noted. Palpation-TTP R forearm without swelling heat or erythema. compartments soft ROM- Full ROM about the shoulder, elbow, wrist. Strength- 5/5 AIN/PIN/U NV- SILT M/R/U, +2 Radial +2 ulnar pulse   Skin:    General: Skin is warm and dry.  Neurological:     Mental Status: She is oriented to person, place, and time. She is lethargic.     GCS: GCS eye subscore is 3. GCS verbal subscore is 5. GCS motor subscore is 6.  Psychiatric:        Behavior: Behavior normal.      ED Treatments / Results  Labs (all labs ordered are listed, but only abnormal results are displayed) Labs Reviewed  CULTURE, BLOOD (ROUTINE X 2)  CULTURE, BLOOD (ROUTINE X 2)  URINE CULTURE  SARS CORONAVIRUS 2 (TAT 6-24 HRS)  COMPREHENSIVE METABOLIC PANEL  CBC WITH DIFFERENTIAL/PLATELET  URINALYSIS, COMPLETE (UACMP) WITH MICROSCOPIC  AMMONIA  LACTIC ACID, PLASMA  RAPID URINE DRUG SCREEN, HOSP PERFORMED  ETHANOL  CBG MONITORING, ED    EKG None  Radiology No results found.  Procedures Procedures (including critical care time)  Medications Ordered in ED Medications - No data to display   Initial Impression / Assessment and Plan / ED Course  I have reviewed the triage vital signs and the nursing notes.  Pertinent labs & imaging results that were available during my care of the patient were reviewed by me and considered in my medical decision making (see chart for details).  Clinical Course as of Mar 29 2119  Tue Mar 29, 2019  1958 On reevaluation of the patient her sister is at bedside.  Patient now saying that after passing out today she was unable to move the arm.  She is also claiming that she has a history of multiple previous TIAs.  She continues to feel altered, and very tired.   [  AH]    Clinical Course User Index [AH] Margarita Mail, PA-C       CC:Arm pain  weakness/ abdominal pain and syncope VS:  Vitals:   03/29/19 1544 03/29/19 1630 03/29/19 1800 03/29/19 2000  BP:  (!) 131/53 135/78 (!) 118/57  Pulse:  84 86 78  Resp:  16 14 17   Temp: 97.7 F (36.5 C)     TempSrc: Rectal     SpO2:  97% 99% 94%  Weight:      Height:       [id number] is gathered by patient and EMR, sister who is at bedside. DDX: The differential diagnosis of weakness includes but is not limited to neurologic causes (GBS, myasthenia gravis, CVA, MS, ALS, transverse myelitis, spinal cord injury, CVA, botulism, ) and other causes: ACS, Arrhythmia, syncope, orthostatic hypotension, sepsis, hypoglycemia, electrolyte disturbance, hypothyroidism, respiratory failure, symptomatic anemia, dehydration, heat injury, polypharmacy, malignancy. The differential for syncope is extensive and includes, but is not limited to: arrythmia (Vtach, SVT, SSS, sinus arrest, AV block, bradycardia) aortic stenosis, AMI, HOCM, PE, atrial myxoma, pulmonary hypertension, orthostatic hypotension, (hypovolemia, drug effect, GB syndrome, micturition, cough, swall) carotid sinus sensitivity, Seizure, TIA/CVA, hypoglycemia,  Vertigo. Labs: I reviewed the labs. CMP shows slightly elevated BUN, normal creatinine.  Mildly elevated alkaline phosphatase and ALT.  CBC shows no abnormalities, no elevated white blood cell count.  Ammonia level, lactic acid ethanol level within normal limits.  UDS positive for benzodiazepine.  UA is negative for any kind of infection Imaging: I personally reviewed the images (CT abdomen and pelvis with contrast, CT head and C-spine and 1 view chest x-ray) which show(s) no acute intracranial or cervical pathology, CT abdomen shows large stool burden, 1 view chest x-ray shows no evidence of consolidation or edema on my interpretation. EKG: Unable to obtain EKG secondary to electrical interference from the patient's implantable stimulator. MDM: Patient  here with vague altered mentation and somnolence, she reports some weakness and inability to move the right arm earlier concern for potential TIA.  Patient will need admission for further work-up, MRI of the brain and TIA work-up. Patient disposition: Admit Patient condition: Stable. The patient appears reasonably stabilized for admission considering the current resources, flow, and capabilities available in the ED at this time, and I doubt any other [facility] requiring further screening and/or treatment in the ED prior to admission.   Final Clinical Impressions(s) / ED Diagnoses   Final diagnoses:  TIA (transient ischemic attack)    ED Discharge Orders    None       Margarita Mail, PA-C 03/29/19 2126    Noemi Chapel, MD 03/30/19 [phone]

## 2019-03-29 NOTE — Progress Notes (Signed)
Pharmacy Antibiotic Note  Courtney Tucker is a 73 y.o. female admitted on 03/29/2019 with possible TIA.  Pharmacy has been consulted for vancomycin dosing for cellulitis.  Plan: Vancomycin 1250mg  IV x 1, then 750mg  IV q24h for estimated AUC 405 (goal AUC 400-550) Follow up renal function & cultures, levels as needed  Height: 5\' 2"  (157.5 cm) Weight: 148 lb (67.1 kg) IBW/kg (Calculated) : 50.1  Temp (24hrs), Avg:97.8 F (36.6 C), Min:97.7 F (36.5 C), Max:97.9 F (36.6 C)  Recent Labs  Lab 03/29/19 1448  WBC 6.1  CREATININE 0.97  LATICACIDVEN 1.3    Estimated Creatinine Clearance: 46.4 mL/min (by C-G formula based on SCr of 0.97 mg/dL).    Allergies  Allergen Reactions  . Oxycodone Other (See Comments)    Can tolerate low dose mixed with tynlelol HALLUCINATIONS   . Aspirin Nausea And Vomiting and Other (See Comments)    Severe stomach pain   . Codeine Nausea Only    Makes her feel very sick     Antimicrobials this admission:  11/3 Vancomycin >>  Dose adjustments this admission:   Microbiology results:  11/3 BCx: 11/3 UCx: 11/3 SARS CoV-2:  Thank you for allowing pharmacy to be a part of this patient's care.  Peggyann Juba, PharmD, BCPS Pharmacy: [phone] 03/29/2019 9:20 PM

## 2019-03-29 NOTE — H&P (Signed)
History and Physical:    Courtney Tucker   [id number] DOB: 1945/09/25 DOA: 03/29/2019  Referring MD/provider: Lawanda Cousins  PCP: Scotty Court, DO   Patient coming from: Home  Chief Complaint: Inability to move right arm and an episode of syncope earlier today.  History of Present Illness:   Courtney Tucker is an 73 y.o. 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  Extensive work-up was essentially normal except for marked stool volume which was likely cause of abdominal pain.  As patient was getting ready to be discharged however patient noted that in addition to right arm pain she had had an episode where she could not use her right upper extremity.  This led to referral to [facility] for admission for TIA work-up.  History as per patient however she has very difficult to understand speech so history is somewhat limited in detail.  Patient tells me that she has been very sleepy since this morning.  She is not sure if this has anything to do with her medications.  She notes that before breakfast she had an episode where she was completely unable to move her right arm.  She demonstrates that she had to lift up her right arm with her left hand in order to move it.  She also notes that she was unable to move her right fingers.  She was fed breakfast by the nursing staff at her SNF.  She also believes she had an episode of syncope while she was sitting in her chair.  She did not fall out of her chair.  She does not know how long that lasted.  She denies any injury.  Patient is not sure when her right upper extremity functioning returned however notes that by the time she came here she was able to use her right arm.  She did  have some pain in her right arm which she had initially discussed with the ED provider but notes the right arm pain is much improved.  Patient sister who is at bedside is concerned about an area of warmth and redness in her left lower extremity around a abrasion she sustained during a fall last week.   Patient herself denies any fevers or chills.  She denies feeling confused.  She does think she is much more awake now than when she came in earlier.  She does not remember the syncope episode and is not quite sure how she knows that she had syncope.  Patient denies headache, nausea or vomiting.  Patient denies any diarrhea in fact she is quite constipated.  No cough, no shortness of breath, no chest pain or palpitations.   ED Course:  The patient underwent multiple imaging and laboratory studies all of which were essentially negative.  Patient was observed for about 6 hours and she woke up slowly over the period of the 6 hours.   ROS:   ROS   Review of Systems: As per HPI.  Past Medical History:   Past Medical History:  Diagnosis Date   Anxiety    Arthritis    Complication of anesthesia    Depression    Dysphasia    GERD (gastroesophageal reflux disease)    Hyperlipidemia    Hypertension    Parkinson's disease ([place])  diagnosed at age 51   PONV (postoperative nausea and vomiting)    states it has been a long time since getting sick   Reflux    Sleep apnea    Has CPAP but doesnt use   TIA (transient ischemic attack)    not really TIA but abnormal MRI per pt left sided weakness   UTI (lower urinary tract infection)     Past Surgical History:   Past Surgical History:  Procedure Laterality Date   BACK SURGERY     Battery change     DBS, 12/2009   BIOPSY N/A 11/29/2013   Procedure: GASTRIC BIOPSY;  Surgeon: Danie Binder, MD;  Location: [facility];  Service: Endoscopy;  Laterality: N/A;   BURR HOLE W/ STEREOTACTIC INSERTION OF DBS LEADS / INTRAOP MICROELECTRODE  RECORDING     2007   CARPAL TUNNEL RELEASE Right    COLONOSCOPY WITH PROPOFOL N/A 11/29/2013   Procedure: COLONOSCOPY WITH PROPOFOL;  Surgeon: Danie Binder, MD;  Location: [facility];  Service: Endoscopy;  Laterality: N/A;  entered cecum @ [phone]; total cecal withdrawal time 21 minutes    ELBOW SURGERY Left    after fx   ESOPHAGEAL DILATION N/A 11/29/2013   Procedure: ESOPHAGEAL DILATION;  Surgeon: Danie Binder, MD;  Location: [facility];  Service: Endoscopy;  Laterality: N/A;  [place] 14/15/16   ESOPHAGOGASTRODUODENOSCOPY (EGD) WITH PROPOFOL N/A 11/29/2013   Procedure: ESOPHAGOGASTRODUODENOSCOPY (EGD) WITH PROPOFOL;  Surgeon: Danie Binder, MD;  Location: [facility];  Service: Endoscopy;  Laterality: N/A;   FOOT SURGERY Left    "something with 2nd 2."   NECK SURGERY     Fusion   POLYPECTOMY N/A 11/29/2013   Procedure: POLYPECTOMY;  Surgeon: Danie Binder, MD;  Location: [facility];  Service: Endoscopy;  Laterality: N/A;  Distal Transverse Colon x2    PR ANALYZE NEUROSTIM BRAIN, FIRST 1H  10/15/2012       PULSE GENERATOR IMPLANT Right 03/30/2018   Procedure: Right Implantable pulse generator change;  Surgeon: Erline Levine, MD;  Location: [facility];  Service: Neurosurgery;  Laterality: Right;   SUBTHALAMIC STIMULATOR BATTERY REPLACEMENT N/A 08/05/2013   Procedure: SUBTHALAMIC STIMULATOR BATTERY REPLACEMENT;  Surgeon: Erline Levine, MD;  Location: [facility];  Service: Neurosurgery;  Laterality: N/A;   SUBTHALAMIC STIMULATOR BATTERY REPLACEMENT N/A 11/26/2015   Procedure: Implantable pulse generator battery change for deep brain stimulator;  Surgeon: Erline Levine, MD;  Location: [facility];  Service: Neurosurgery;  Laterality: N/A;   VAGINAL HYSTERECTOMY     "partial"   WRIST SURGERY Right    after fx    Social History:   Social History   Socioeconomic History   Marital status: Widowed    Spouse name: Not on file   Number of children: Not on file   Years of education: Not on file    Highest education level: Not on file  Occupational History   Not on file  Social Needs   Financial resource strain: Not on file   Food insecurity    Worry: Not on file    Inability: Not on file   Transportation needs    Medical: Not on file    Non-medical: Not on file  Tobacco Use   Smoking status: Current Every Day Smoker    Packs/day: 0.50    Years: 25.00    Pack years: 12.50    Types: Cigarettes   Smokeless tobacco: Never Used  Substance and Sexual Activity   Alcohol use: Yes  Comment: 1/2 glass wine rarely   Drug use: No   Sexual activity: Not on file  Lifestyle   Physical activity    Days per week: Not on file    Minutes per session: Not on file   Stress: Not on file  Relationships   Social connections    Talks on phone: Not on file    Gets together: Not on file    Attends religious service: Not on file    Active member of club or organization: Not on file    Attends meetings of clubs or organizations: Not on file    Relationship status: Not on file   Intimate partner violence    Fear of current or ex partner: Not on file    Emotionally abused: Not on file    Physically abused: Not on file    Forced sexual activity: Not on file  Other Topics Concern   Not on file  Social History Narrative   Not on file    Allergies   Oxycodone, Aspirin, and Codeine  Family history:   Family History  Problem Relation Age of Onset   Colon cancer Paternal Uncle     Current Medications:   Prior to Admission medications   Medication Sig Start Date End Date Taking? Authorizing Provider  acetaminophen (TYLENOL) 500 MG tablet Take 500 mg by mouth every 6 (six) hours as needed for mild pain or moderate pain.   Yes [provider]  amLODipine (NORVASC) 10 MG tablet Take 10 mg by mouth daily.    Yes [provider]  Carbidopa-Levodopa ER (SINEMET CR) 25-100 MG tablet controlled release TAKE 1 TABLET BY MOUTH THREE TIMES A DAY Patient  taking differently: Take 1 tablet by mouth every morning.  09/20/18  Yes Tat, Eustace Quail, DO  Cholecalciferol (VITAMIN D) 50 MCG (2000 UT) tablet Take 2,000 Units by mouth daily.   Yes [provider]  clopidogrel (PLAVIX) 75 MG tablet Take 75 mg by mouth daily.   Yes [provider]  DULoxetine (CYMBALTA) 60 MG capsule Take 60 mg by mouth 2 (two) times daily.    Yes [provider]  losartan (COZAAR) 100 MG tablet Take 100 mg by mouth daily.   Yes [provider]  oxybutynin (DITROPAN) 5 MG tablet Take 5 mg by mouth 2 (two) times daily.   Yes [provider]  pramipexole (MIRAPEX) 1 MG tablet Take 1.5 mg by mouth at bedtime.  06/18/16  Yes [provider]  traZODone (DESYREL) 100 MG tablet Take 100 mg by mouth at bedtime.   Yes [provider]    Physical Exam:   Vitals:   03/29/19 1544 03/29/19 1630 03/29/19 1800 03/29/19 2000  BP:  (!) 131/53 135/78 (!) 118/57  Pulse:  84 86 78  Resp:  16 14 17   Temp: 97.7 F (36.5 C)     TempSrc: Rectal     SpO2:  97% 99% 94%  Weight:      Height:         Physical Exam: Blood pressure (!) 118/57, pulse 78, temperature 97.7 F (36.5 C), temperature source Rectal, resp. rate 17, height 5\' 2"  (1.575 m), weight 67.1 kg, SpO2 94 %. Gen: Chronically ill-appearing female looking much older than stated age lying flat in the stretcher in no acute distress.  Patient's speech is at baseline per patient's sister who is at bedside. Eyes: Sclerae anicteric. Conjunctiva mildly injected. Chest: Moderately good air entry bilaterally with no adventitious  sounds.  CV: Distant, regular, no audible murmurs. Abdomen: NABS, soft, obese, may be slightly distended but not tympanic and it is nontender to my exam.  No rebound tenderness.  Extremities: Patient has an abrasion on anterior shin with surrounding warmth and erythema and increased edema on the left leg.  Neuro: Alert and oriented times 3; bilateral  upper extremity with intact flexion extension abduction and abduction.  Grip strength is 4- out of 5 bilaterally.  She has marked dysarthria as noted previously but this is patient's baseline per her sister.  I am unable to elicit any reflexes either biceps, triceps or brachioradialis.  I do not see any tremor.  No rigidity. Psych: Patient is cooperative, logical and coherent.  Data Review:    Labs: Basic Metabolic Panel: Recent Labs  Lab 03/29/19 1448  NA 138  K 4.0  CL 103  CO2 27  GLUCOSE 103*  BUN 26*  CREATININE 0.97  CALCIUM 9.4   Liver Function Tests: Recent Labs  Lab 03/29/19 1448  AST 25  ALT 45*  ALKPHOS 161*  BILITOT 0.3  PROT 7.5  ALBUMIN 4.2   No results for input(s): LIPASE, AMYLASE in the last 168 hours. Recent Labs  Lab 03/29/19 1448  AMMONIA 15   CBC: Recent Labs  Lab 03/29/19 1448  WBC 6.1  NEUTROABS 3.1  HGB 14.1  HCT 42.7  MCV 96.6  PLT 258   Cardiac Enzymes: No results for input(s): CKTOTAL, CKMB, CKMBINDEX, TROPONINI in the last 168 hours.  BNP (last 3 results) No results for input(s): PROBNP in the last 8760 hours. CBG: No results for input(s): GLUCAP in the last 168 hours.  Urinalysis    Component Value Date/Time   COLORURINE YELLOW 03/29/2019 1433   APPEARANCEUR CLEAR 03/29/2019 1433   LABSPEC 1.018 03/29/2019 1433   PHURINE 5.0 03/29/2019 1433   GLUCOSEU NEGATIVE 03/29/2019 1433   HGBUR NEGATIVE 03/29/2019 1433   BILIRUBINUR NEGATIVE 03/29/2019 1433   KETONESUR NEGATIVE 03/29/2019 1433   PROTEINUR NEGATIVE 03/29/2019 1433   UROBILINOGEN 0.2 01/03/2013 2125   NITRITE NEGATIVE 03/29/2019 1433   LEUKOCYTESUR NEGATIVE 03/29/2019 1433      Radiographic Studies: Dg Chest 1 View  Result Date: 03/29/2019 CLINICAL DATA:  Altered mental status EXAM: CHEST  1 VIEW COMPARISON:  12/30/2018 FINDINGS: Cardiac shadow is stable. Stimulator pack is again seen over the right chest wall. The lungs are well aerated bilaterally. No  focal infiltrate or sizable effusion is seen. No acute bony abnormality is noted. Postsurgical changes in the cervical spine are seen. IMPRESSION: No acute abnormality noted. Electronically Signed   By: Inez Catalina M.D.   On: 03/29/2019 15:44   Ct Head Wo Contrast  Result Date: 03/29/2019 CLINICAL DATA:  Syncopal episode today as well as a fall 2 weeks ago. Right arm and rib pain. Right arm weakness. EXAM: CT HEAD WITHOUT CONTRAST CT CERVICAL SPINE WITHOUT CONTRAST TECHNIQUE: Multidetector CT imaging of the head and cervical spine was performed following the standard protocol without intravenous contrast. Multiplanar CT image reconstructions of the cervical spine were also generated. COMPARISON:  Head CT 08/05/2017. FINDINGS: CT HEAD FINDINGS Brain: Deep brain stimulator leads remain in place extending to the subthalamic regions bilaterally. Within limitations of artifact associated with the leads, no acute infarct, intracranial hemorrhage, mass, midline shift, or extra-axial fluid collection is identified. Hypodensities in the cerebral white matter bilaterally are similar to the prior study and nonspecific but compatible with mild chronic small vessel ischemic disease. The ventricles  and sulci are within normal limits for age. Vascular: Calcified atherosclerosis at the skull base. No hyperdense vessel. Skull: No fracture or focal osseous lesion. Sinuses/Orbits: Visualized paranasal sinuses and mastoid air cells are clear. Orbits are unremarkable. Other: None. CT CERVICAL SPINE FINDINGS Alignment: Slight anterolisthesis of C6 on C7, C7 on T1, and T1 on T2. Skull base and vertebrae: No acute fracture or suspicious osseous lesion. C5-C7 ACDF with solid arthrodesis at C5-6. Lucency through the C6-7 disc space with endplate sclerosis suggesting pseudoarthrosis. Soft tissues and spinal canal: No evidence of prevertebral soft tissue swelling within limitations of streak artifact. Disc levels: Mild disc space  narrowing at C3-4 and C7-T1 with moderate narrowing at C4-5. Moderate multilevel facet arthrosis, right greater than left. Mild-to-moderate right neural foraminal stenosis at C4-5 due to uncovertebral and facet spurring. Upper chest: Mild motion artifact and nonspecific mosaic attenuation in the lung apices. No pneumothorax. Other: None. IMPRESSION: 1. No evidence of acute intracranial abnormality. 2. Mild chronic small vessel ischemic disease. 3. No evidence of acute cervical spine fracture. 4. 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 Electronically Signed   By: Logan Bores M.D.   On: 03/29/2019 16:45   Ct Cervical Spine Wo Contrast  Result Date: 03/29/2019 CLINICAL DATA:  Syncopal episode today as well as a fall 2 weeks ago. Right arm and rib pain. Right arm weakness. EXAM: CT HEAD WITHOUT CONTRAST CT CERVICAL SPINE WITHOUT CONTRAST TECHNIQUE: Multidetector CT imaging of the head and cervical spine was performed following the standard protocol without intravenous contrast. Multiplanar CT image reconstructions of the cervical spine were also generated. COMPARISON:  Head CT 08/05/2017. FINDINGS: CT HEAD FINDINGS Brain: Deep brain stimulator leads remain in place extending to the subthalamic regions bilaterally. Within limitations of artifact associated with the leads, no acute infarct, intracranial hemorrhage, mass, midline shift, or extra-axial fluid collection is identified. Hypodensities in the cerebral white matter bilaterally are similar to the prior study and nonspecific but compatible with mild chronic small vessel ischemic disease. The ventricles and sulci are within normal limits for age. Vascular: Calcified atherosclerosis at the skull base. No hyperdense vessel. Skull: No fracture or focal osseous lesion. Sinuses/Orbits: Visualized paranasal sinuses and mastoid air cells are clear. Orbits are unremarkable. Other: None. CT CERVICAL SPINE FINDINGS Alignment: Slight  anterolisthesis of C6 on C7, C7 on T1, and T1 on T2. Skull base and vertebrae: No acute fracture or suspicious osseous lesion. C5-C7 ACDF with solid arthrodesis at C5-6. Lucency through the C6-7 disc space with endplate sclerosis suggesting pseudoarthrosis. Soft tissues and spinal canal: No evidence of prevertebral soft tissue swelling within limitations of streak artifact. Disc levels: Mild disc space narrowing at C3-4 and C7-T1 with moderate narrowing at C4-5. Moderate multilevel facet arthrosis, right greater than left. Mild-to-moderate right neural foraminal stenosis at C4-5 due to uncovertebral and facet spurring. Upper chest: Mild motion artifact and nonspecific mosaic attenuation in the lung apices. No pneumothorax. Other: None. IMPRESSION: 1. No evidence of acute intracranial abnormality. 2. Mild chronic small vessel ischemic disease. 3. No evidence of acute cervical spine fracture. 4. 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 Electronically Signed   By: Logan Bores M.D.   On: 03/29/2019 16:45   Ct Abdomen Pelvis W Contrast  Result Date: 03/29/2019 CLINICAL DATA:  Right-sided abdominal pain. Syncopal episode. EXAM: CT ABDOMEN AND PELVIS WITH CONTRAST TECHNIQUE: Multidetector CT imaging of the abdomen and pelvis was performed using the standard  protocol following bolus administration of intravenous contrast. CONTRAST:  169mL OMNIPAQUE IOHEXOL 300 MG/ML  SOLN COMPARISON:  None. FINDINGS: Lower Chest: No acute findings. Hepatobiliary: No hepatic masses identified. Prior cholecystectomy. No evidence of biliary obstruction. Pancreas:  No mass or inflammatory changes. Spleen: Within normal limits in size and appearance. Adrenals/Urinary Tract: No masses identified. A 2 mm calculus is seen in the upper pole of the right kidney. No evidence of ureteral calculi or hydronephrosis. Unremarkable unopacified urinary bladder. Stomach/Bowel: Small hiatal hernia. No evidence of  obstruction, inflammatory process or abnormal fluid collections. Normal appendix visualized. Large stool burden noted throughout the colon. Vascular/Lymphatic: No pathologically enlarged lymph nodes. No abdominal aortic aneurysm. Reproductive: Prior hysterectomy noted. Adnexal regions are unremarkable in appearance. Other:  None. Musculoskeletal:  No suspicious bone lesions identified. IMPRESSION: No evidence of appendicitis or other acute findings. Large stool burden noted; recommend clinical correlation for possible constipation. Tiny nonobstructing right renal calculus. Small hiatal hernia. Electronically Signed   By: Marlaine Hind M.D.   On: 03/29/2019 18:08    EKG: Independently reviewed.  Severe artifact due to DBS which may affect accuracy of interpretation.  NSR at 75.  Normal intervals.  Normal axis.  No acute ST-T wave changes.   Assessment/Plan:   Principal Problem:   TIA (transient ischemic attack) Active Problems:   Essential hypertension   Pseudobulbar affect   Depression, major   Parkinson's disease ([place])   S/P deep brain stimulator placement  73 year old female with advanced Parkinson's disease was evaluated for multiple medical complaints but is now being admitted for rule out TIA given episode of inability to use right upper extremity.  R/O TIA Patient's right upper extremity functionality is similar to her left upper extremity. Will order carotid Dopplers and echocardiogram. Patient is unable to have an MRI due to her DBS. Patient is already on Plavix which I am continuing, further antiplatelet therapy can be prescribed per neurology as warranted. Neurology consult requested.  CELLULITIS  Patient appears to have cellulitis on her left lower extremity, will treat with vancomycin given residence in an SNF.  SOMNOLENCE Patient is awake and alert and at her baseline according to her sister. There may be some level of hangover from her medications which resolved with  time.  SYNCOPE  It is unclear to me if patient actually had syncope versus just fell asleep given her marked somnolence. Will place patient on telemetry overnight.  Further work-up is warranted.  CONSTIPATION Patient can get an enema in the morning if warranted.  HTN We will hold amlodipine and losartan overnight for permissive hypertension.  These can be restarted in the morning if warranted.  DEPRESSION Continue duloxetine and trazodone. It may be possible that 100 mg of trazodone is causing hangover/somnolence in the morning. This can be addressed with her outpatient physician.  PARKINSONS DISEASE WITH DBS Continue Sinemet and Mirapex.     Other information:   DVT prophylaxis: Lovenox ordered. Code Status: Full code. Family Communication: Patient sister was at bedside throughout Disposition Plan: SNF Consults called: Neurology Admission status: Observation   Anahi Belmar Tublu Hiba Garry [facility]  If 7PM-7AM, please contact night-coverage [email or web address] Password [facility] 03/29/2019, 8:38 PM

## 2019-03-30 ENCOUNTER — Other Ambulatory Visit: Payer: Self-pay

## 2019-03-30 ENCOUNTER — Observation Stay (HOSPITAL_COMMUNITY): Payer: Medicare Other

## 2019-03-30 ENCOUNTER — Observation Stay (HOSPITAL_BASED_OUTPATIENT_CLINIC_OR_DEPARTMENT_OTHER): Payer: Medicare Other

## 2019-03-30 DIAGNOSIS — F482 Pseudobulbar affect: Secondary | ICD-10-CM | POA: Diagnosis present

## 2019-03-30 DIAGNOSIS — G4730 Sleep apnea, unspecified: Secondary | ICD-10-CM | POA: Diagnosis present

## 2019-03-30 DIAGNOSIS — R55 Syncope and collapse: Secondary | ICD-10-CM | POA: Diagnosis present

## 2019-03-30 DIAGNOSIS — G459 Transient cerebral ischemic attack, unspecified: Secondary | ICD-10-CM

## 2019-03-30 DIAGNOSIS — F17210 Nicotine dependence, cigarettes, uncomplicated: Secondary | ICD-10-CM | POA: Diagnosis present

## 2019-03-30 DIAGNOSIS — Z9682 Presence of neurostimulator: Secondary | ICD-10-CM | POA: Diagnosis not present

## 2019-03-30 DIAGNOSIS — K5900 Constipation, unspecified: Secondary | ICD-10-CM | POA: Diagnosis present

## 2019-03-30 DIAGNOSIS — I69322 Dysarthria following cerebral infarction: Secondary | ICD-10-CM | POA: Diagnosis not present

## 2019-03-30 DIAGNOSIS — Z20828 Contact with and (suspected) exposure to other viral communicable diseases: Secondary | ICD-10-CM | POA: Diagnosis present

## 2019-03-30 DIAGNOSIS — K219 Gastro-esophageal reflux disease without esophagitis: Secondary | ICD-10-CM | POA: Diagnosis present

## 2019-03-30 DIAGNOSIS — M1990 Unspecified osteoarthritis, unspecified site: Secondary | ICD-10-CM | POA: Diagnosis present

## 2019-03-30 DIAGNOSIS — I10 Essential (primary) hypertension: Secondary | ICD-10-CM | POA: Diagnosis present

## 2019-03-30 DIAGNOSIS — I69354 Hemiplegia and hemiparesis following cerebral infarction affecting left non-dominant side: Secondary | ICD-10-CM | POA: Diagnosis not present

## 2019-03-30 DIAGNOSIS — Z886 Allergy status to analgesic agent status: Secondary | ICD-10-CM | POA: Diagnosis not present

## 2019-03-30 DIAGNOSIS — E785 Hyperlipidemia, unspecified: Secondary | ICD-10-CM | POA: Diagnosis present

## 2019-03-30 DIAGNOSIS — M79601 Pain in right arm: Secondary | ICD-10-CM | POA: Diagnosis present

## 2019-03-30 DIAGNOSIS — I69321 Dysphasia following cerebral infarction: Secondary | ICD-10-CM | POA: Diagnosis not present

## 2019-03-30 DIAGNOSIS — F329 Major depressive disorder, single episode, unspecified: Secondary | ICD-10-CM | POA: Diagnosis present

## 2019-03-30 DIAGNOSIS — F419 Anxiety disorder, unspecified: Secondary | ICD-10-CM | POA: Diagnosis present

## 2019-03-30 DIAGNOSIS — Z885 Allergy status to narcotic agent status: Secondary | ICD-10-CM | POA: Diagnosis not present

## 2019-03-30 DIAGNOSIS — G20 Parkinson's disease: Secondary | ICD-10-CM | POA: Diagnosis present

## 2019-03-30 DIAGNOSIS — Z800 Family history of malignant neoplasm of digestive organs: Secondary | ICD-10-CM | POA: Diagnosis not present

## 2019-03-30 DIAGNOSIS — L03116 Cellulitis of left lower limb: Secondary | ICD-10-CM | POA: Diagnosis present

## 2019-03-30 LAB — BASIC METABOLIC PANEL
Anion gap: 10 (ref 5–15)
BUN: 21 mg/dL (ref 8–23)
CO2: 25 mmol/L (ref 22–32)
Calcium: 9 mg/dL (ref 8.9–10.3)
Chloride: 104 mmol/L (ref 98–111)
Creatinine, Ser: 0.8 mg/dL (ref 0.44–1.00)
GFR calc Af Amer: >60 mL/min (ref >60)
GFR calc non Af Amer: >60 mL/min (ref >60)
Glucose, Bld: 100 mg/dL — ABNORMAL HIGH (ref 70–99)
Potassium: 4 mmol/L (ref 3.5–5.1)
Sodium: 139 mmol/L (ref 135–145)

## 2019-03-30 LAB — LIPID PANEL
Cholesterol: 178 mg/dL (ref 0–200)
HDL: 45 mg/dL (ref >40)
LDL Cholesterol: 108 mg/dL — ABNORMAL HIGH (ref 0–99)
Total CHOL/HDL Ratio: 4 RATIO
Triglycerides: 127 mg/dL (ref <150)
VLDL: 25 mg/dL (ref 0–40)

## 2019-03-30 LAB — URINE CULTURE

## 2019-03-30 LAB — MRSA PCR SCREENING: MRSA by PCR: NEGATIVE

## 2019-03-30 LAB — GLUCOSE, CAPILLARY: Glucose-Capillary: 87 mg/dL (ref 70–99)

## 2019-03-30 LAB — ECHOCARDIOGRAM COMPLETE
Height: 62 in
Weight: 2368 oz

## 2019-03-30 LAB — HEMOGLOBIN A1C
Hgb A1c MFr Bld: 5.6 % (ref 4.8–5.6)
Mean Plasma Glucose: 114.02 mg/dL

## 2019-03-30 LAB — SARS CORONAVIRUS 2 (TAT 6-24 HRS): SARS Coronavirus 2: NEGATIVE

## 2019-03-30 MED ORDER — CEFAZOLIN SODIUM-DEXTROSE 1-4 GM/50ML-% IV SOLN
1.0000 g | Freq: Three times a day (TID) | INTRAVENOUS | Status: DC
  Administered 2019-03-30 – 2019-03-31 (×3): 1 g via INTRAVENOUS
  Filled 2019-03-30 (×6): qty 50

## 2019-03-30 MED ORDER — SORBITOL 70 % SOLN
960.0000 mL | TOPICAL_OIL | Freq: Once | ORAL | Status: DC
  Filled 2019-03-30: qty 473

## 2019-03-30 NOTE — Evaluation (Signed)
Physical Therapy Evaluation Patient Details Name: ORTHA Tucker MRN: [id number] DOB: Dec 21, 1945 Today's Date: 03/30/2019   History of Present Illness  Courtney Tucker is an 73 y.o. 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  Extensive work-up was essentially normal except for marked stool volume which was likely cause of abdominal pain.  As patient was getting ready to be discharged however patient noted that in addition to right arm pain she had had an episode where she could not use her right upper extremity.  This led to referral to [facility] hospitalist for admission for TIA work-up.    Clinical Impression  Patient presents with significant weakness, and decreased tolerance to activity. Currently patient is mod/ max assist for bed mobility and max assist with transfers. Patient required mod verbal cueing for positioning at EOB, and demos a leftward trunk lean. Patient unable to maintain seated balance without use of BUE. Patient was max assist with sit to stand, but was able to maintain standing briefly (<1 minute), with therapist hand hold assist before needing to rest due to fatigue. Patient currently unable to ambulate due to BLE weakness/ inability to maintain standing without assistance. Patient was max A stand pivot transfer from bed to chair. Patient left upright in chair, phone and call bell in reach, chair alarm on and nursing notified of mobility status. Patient will benefit from continued physical therapy in hospital and recommended venue below to increase strength, balance, endurance for safe ADLs and gait.      Follow Up Recommendations SNF;Supervision/Assistance - 24 hour;Supervision for mobility/OOB    Equipment  Recommendations       Recommendations for Other Services       Precautions / Restrictions Precautions Precautions: Fall Restrictions Weight Bearing Restrictions: No      Mobility  Bed Mobility Overal bed mobility: Needs Assistance Bed Mobility: Supine to Sit     Supine to sit: Max assist        Transfers Overall transfer level: Needs assistance Equipment used: 1 person hand held assist Transfers: Sit to/from Stand;Stand Pivot Transfers Sit to Stand: Max assist;Mod assist Stand pivot transfers: Max assist          Ambulation/Gait Ambulation/Gait assistance: Total assist Gait Distance (Feet): 0 Feet            Stairs            Wheelchair Mobility    Modified Rankin (Stroke Patients Only)       Balance Overall balance assessment: Needs assistance Sitting-balance support: Bilateral upper extremity supported;Feet supported Sitting balance-Leahy Scale: Poor Sitting balance - Comments: Seated at EOB Postural control: Left lateral lean Standing balance support: Bilateral upper extremity supported Standing balance-Leahy Scale: Poor Standing balance comment: Standing with RW                             Pertinent Vitals/Pain Pain Assessment: No/denies pain    Home Living Family/patient expects to be discharged to:: Assisted living               Home Equipment: Walker - 4 wheels;Wheelchair - manual Additional Comments: Patient states she has her own wheelchair and walker, but that equipment needed for bathing and toiletting is available to assisted living facility.  Prior Function Level of Independence: Needs assistance   Gait / Transfers Assistance Needed: unable to ambulate without assist at living facility  ADL's / Homemaking Assistance Needed: Assist from staff at living facility        Hand Dominance        Extremity/Trunk Assessment   Upper Extremity Assessment Upper Extremity Assessment: Defer to OT  evaluation    Lower Extremity Assessment Lower Extremity Assessment: Generalized weakness    Cervical / Trunk Assessment Cervical / Trunk Assessment: Normal  Communication   Communication: Expressive difficulties  Cognition Arousal/Alertness: Awake/alert Behavior During Therapy: WFL for tasks assessed/performed Overall Cognitive Status: Within Functional Limits for tasks assessed                                        General Comments      Exercises     Assessment/Plan    PT Assessment Patient needs continued PT services  PT Problem List Decreased strength;Decreased activity tolerance;Decreased balance;Decreased mobility       PT Treatment Interventions Gait training;Functional mobility training;Therapeutic activities;Therapeutic exercise;Wheelchair mobility training;Patient/family education    PT Goals (Current goals can be found in the Care Plan section)  Acute Rehab PT Goals Patient Stated Goal: Return to assisted living facility PT Goal Formulation: With patient Time For Goal Achievement: 04/13/19 Potential to Achieve Goals: Good    Frequency Min 3X/week   Barriers to discharge        Co-evaluation               AM-PAC PT "6 Clicks" Mobility  Outcome Measure Help needed turning from your back to your side while in a flat bed without using bedrails?: A Lot Help needed moving from lying on your back to sitting on the side of a flat bed without using bedrails?: A Lot Help needed moving to and from a bed to a chair (including a wheelchair)?: A Lot Help needed standing up from a chair using your arms (e.g., wheelchair or bedside chair)?: A Lot Help needed to walk in hospital room?: Total Help needed climbing 3-5 steps with a railing? : Total 6 Click Score: 10    End of Session Equipment Utilized During Treatment: Gait belt Activity Tolerance: Patient limited by fatigue;Patient tolerated treatment well Patient left: in chair;with call  bell/phone within reach;with chair alarm set Nurse Communication: Mobility status PT Visit Diagnosis: Unsteadiness on feet (R26.81);Muscle weakness (generalized) (M62.81);Other abnormalities of gait and mobility (R26.89);Difficulty in walking, not elsewhere classified (R26.2)    Time: [id number] PT Time Calculation (min) (ACUTE ONLY): 30 min   Charges:   PT Evaluation $PT Eval Moderate Complexity: 1 Mod PT Treatments $Therapeutic Activity: 8-22 mins        2:15 PM, 03/30/19 Josue Hector PT DPT  Physical Therapist with [facility]  [phone]

## 2019-03-30 NOTE — Plan of Care (Signed)
  Problem: Acute Rehab PT Goals(only PT should resolve) Goal: Pt Will Go Sit To Supine/Side Flowsheets (Taken 03/30/2019 1416) Pt will go Sit to Supine/Side: with moderate assist Goal: Patient Will Transfer Sit To/From Stand Flowsheets (Taken 03/30/2019 1416) Patient will transfer sit to/from stand: with moderate assist Goal: Pt Will Transfer Bed To Chair/Chair To Bed Flowsheets (Taken 03/30/2019 1416) Pt will Transfer Bed to Chair/Chair to Bed: with mod assist   2:19 PM, 03/30/19 Josue Hector PT DPT  Physical Therapist with [facility]  [phone]

## 2019-03-30 NOTE — TOC Initial Note (Signed)
Transition of Care [facility]) - Initial/Assessment Note    Patient Details  Name: Courtney Tucker MRN: [id number] Date of Birth: [birth year]  [facility] CM/SW Contact:    Shade Flood, LCSW Phone Number: 03/30/2019, 3:17 PM  Clinical Narrative:                  Pt admitted from [facility] ALF. PT evaluated and stating pt needs mod/max assist with transfers so if ALF can't handle that, then they are recommending SNF. Spoke with Tammy at [facility] to update. Per Tammy, they will come assess pt in the AM but she feels pt is at baseline. Pt has only been at [facility] since 03/17/19. She was placed there by APS. Per Tammy, pt is not to have contact with her daughter, [name]. She may have contact with her sister, [name].   APS worker is Ulice Bold at [phone] with [place]. Voicemail message left for Tiara requesting return call.   Updated MD. Anticipating DC tomorrow. Will follow and assist as needed.   Expected Discharge Plan: Assisted Living Barriers to Discharge: Continued Medical Work up   Patient Goals and CMS Choice        Expected Discharge Plan and Services Expected Discharge Plan: Assisted Living In-house Referral: Clinical Social Work     Living arrangements for the past 2 months: Single Family Home, [place]                                      Prior Living Arrangements/Services Living arrangements for the past 2 months: [place], [place] Lives with:: Facility Resident Patient language and need for interpreter reviewed:: Yes Do you feel safe going back to the place where you live?: Yes      Need for Family Participation in Patient Care: No (Comment) Care giver support system in place?: Yes (comment)   Criminal Activity/Legal Involvement Pertinent to Current Situation/Hospitalization: No - Comment as needed  Activities of Daily Living Home Assistive Devices/Equipment: Eyeglasses ADL  Screening (condition at time of admission) Patient's cognitive ability adequate to safely complete daily activities?: No Is the patient deaf or have difficulty hearing?: No Does the patient have difficulty seeing, even when wearing glasses/contacts?: No Does the patient have difficulty concentrating, remembering, or making decisions?: No Patient able to express need for assistance with ADLs?: Yes Does the patient have difficulty dressing or bathing?: Yes Independently performs ADLs?: No Communication: Independent Dressing (OT): Dependent Is this a change from baseline?: Pre-admission baseline Grooming: Dependent Is this a change from baseline?: Pre-admission baseline Feeding: Needs assistance Is this a change from baseline?: Pre-admission baseline Bathing: Dependent Is this a change from baseline?: Pre-admission baseline Toileting: Dependent Is this a change from baseline?: Pre-admission baseline In/Out Bed: Needs assistance Is this a change from baseline?: Pre-admission baseline Walks in Home: Dependent Is this a change from baseline?: Pre-admission baseline Does the patient have difficulty walking or climbing stairs?: Yes Weakness of Legs: Both Weakness of Arms/Hands: Both  Permission Sought/Granted                  Emotional Assessment Appearance:: Appears stated age     Orientation: : Oriented to Self, Oriented to Place, Oriented to  Time, Oriented to Situation Alcohol / Substance Use: Not Applicable Psych Involvement: No (comment)  Admission diagnosis:  TIA (transient ischemic attack) [G45.9] Patient Active Problem  List   Diagnosis Date Noted  . Syncope 03/30/2019  . TIA (transient ischemic attack) 03/29/2019  . 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 Parkinson's disease ([place]) 02/23/2015  . Encounter for screening colonoscopy 11/14/2013  . Dysphagia, unspecified(787.20) 11/14/2013  . Parkinson disease ([place]) 08/05/2013  . Depression, major 07/25/2013   . Anxiety 12/30/2012  . Paralysis agitans ([place]) 10/15/2012  . 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 Abnormality of gait 10/15/2012  . Dysphasia 10/15/2012  . HYPERLIPIDEMIA-MIXED 02/14/2009  . Essential hypertension 02/14/2009  . DYSPNEA 02/14/2009  . CHEST PAIN-UNSPECIFIED 02/14/2009   PCP:  Scotty Court, DO Pharmacy:   [facility] - [place], [place] [address] Phone: [phone] Fax: [phone]  [facility] - [place], [place] [id number] [place] [id number] [place] [address] Phone: [phone] Fax: [place] [address] [place] [address] Phone: [phone] Fax: [phone]  [facility] [address], [place] mail services [place] [address] Phone: [phone] Fax: [phone]  [id number] - [place], [place] [address] [place] [address] Phone: [phone] Fax: [place], [place] [address] Phone: [phone] Fax: [phone]     Social Determinants of Health (SDOH) Interventions    Readmission Risk Interventions Readmission Risk Prevention Plan 03/30/2019  Medication Screening Complete  Transportation Screening Complete  Some recent data might be hidden

## 2019-03-30 NOTE — Plan of Care (Signed)
Legal guardian Courtney Tucker indicated that pt's daughter Courtney Tucker is not to see or obtain information about the pt, however, the other daughter [name], is allowed to visit as well as obtain information. Contact information for the APS worker is in the chart for additional questions.

## 2019-03-30 NOTE — Progress Notes (Signed)
*  PRELIMINARY RESULTS* Echocardiogram 2D Echocardiogram has been performed.  Leavy Cella 03/30/2019, 10:25 AM

## 2019-03-30 NOTE — Consult Note (Signed)
[place] A. Merlene Laughter, MD     [email or web address]          Courtney Tucker is an 73 y.o. female.   ASSESSMENT/PLAN: 1.  Episode of syncope/loss of consciousness associated with right upper extremity weakness.  Etiology unclear but given the amnesia and on clear loss of time seizure is a possibility.  Given the patient's history of Parkinson disease and multiple medications orthostatic hypotension is also possible.  It is also possible that she may have had an infarct but this was not seen on imaging and would be unlikely to [place] because of loss of consciousness.  An EEG is suggested.  Orthostatics are also recommended.   Patient is 73 year old white female who has a very long history of Parkinson disease.  She tells me she has had Parkinson disease for 37 years.  She apparently is being followed concurrently in [place] at [facility].  She is a resident at a local nursing home.  Patient reports that she lost consciousness and blacked out.  She does not know how long she was out.  When she regained consciousness she noticed that the right upper extremity was weak.  She tells me that her left side is usually weaker than the right due to the Parkinson disease.  The patient is status post deep brain stimulator for Parkinson disease.  She does not report having palpitations or chest pain or shortness of breath with this event.  She does not report headaches.  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  She reports episodic diplopia but this is a longstanding issue.  Patient does report that when she regained consciousness she did have soreness on the left part of her lip.  She denies bowel incontinence.  She did have urine incontinence but she tells me this is a chronic ongoing issue.  The review of systems otherwise negative.    GENERAL: This a very pleasant female who is in discomfort but no acute distress.  HEENT: The neck is supple and no  obvious trauma appreciated.  I do not see clear evidence of tongue trauma or lip trauma.  There is reduced eye blink rate noted.  ABDOMEN: soft  EXTREMITIES: No edema   BACK: Normal  SKIN: Normal by inspection.    MENTAL STATUS: She is awake, lucid and conversational.  She has severe dysarthria.  She does follow commands briskly.  CRANIAL NERVES: Pupils are equal, round and reactive to light and accomodation; extra ocular movements are full, there is no significant nystagmus; visual fields are full; upper and lower facial muscles are normal in strength and symmetric, there is mild flattening of the nasolabial fold on the left side; tongue is midline; uvula is midline; shoulder elevation is normal.  MOTOR: She is noted to have episodic increased tone of the legs bilaterally.  She tells me this happens suggestive of clonus.  Left hip flexion is 4 and right 4+.  Right dorsiflexion is 4+ left 4+.  Deltoids above 4/5.  Triceps 4+ and biceps 5.  COORDINATION: Left finger to nose is normal, right finger to nose is normal, No rest tremor; no intention tremor; no postural tremor; there is moderate to severe cogwheeling bilaterally.  There is a markedly impaired hand dexterity as evidenced by impaired finger tapping and rapid hand opening closing maneuver.  REFLEXES: Deep tendon reflexes are symmetrical and normal. Plantar reflexes are flexor on the left and permanently extensor on the right.  SENSATION: Normal to pain.  Blood pressure (!) 154/35, pulse 88, temperature 98.2 F (36.8 C), temperature source Oral, resp. rate 17, height 5\' 2"  (1.575 m), weight 67.1 kg, SpO2 96 %.  Past Medical History:  Diagnosis Date   Anxiety    Arthritis    Complication of anesthesia    Depression    Dysphasia    GERD (gastroesophageal reflux disease)    Hyperlipidemia    Hypertension    Parkinson's disease ([place])    diagnosed at age 35   PONV (postoperative nausea and vomiting)    states  it has been a long time since getting sick   Reflux    Sleep apnea    Has CPAP but doesnt use   TIA (transient ischemic attack)    not really TIA but abnormal MRI per pt left sided weakness   UTI (lower urinary tract infection)     Past Surgical History:  Procedure Laterality Date   BACK SURGERY     Battery change     DBS, 12/2009   BIOPSY N/A 11/29/2013   Procedure: GASTRIC BIOPSY;  Surgeon: Danie Binder, MD;  Location: [facility];  Service: Endoscopy;  Laterality: N/A;   BURR HOLE W/ STEREOTACTIC INSERTION OF DBS LEADS / INTRAOP MICROELECTRODE RECORDING     2007   CARPAL TUNNEL RELEASE Right    COLONOSCOPY WITH PROPOFOL N/A 11/29/2013   Procedure: COLONOSCOPY WITH PROPOFOL;  Surgeon: Danie Binder, MD;  Location: [facility];  Service: Endoscopy;  Laterality: N/A;  entered cecum @ [phone]; total cecal withdrawal time 21 minutes    ELBOW SURGERY Left    after fx   ESOPHAGEAL DILATION N/A 11/29/2013   Procedure: ESOPHAGEAL DILATION;  Surgeon: Danie Binder, MD;  Location: [facility];  Service: Endoscopy;  Laterality: N/A;  [place] 14/15/16   ESOPHAGOGASTRODUODENOSCOPY (EGD) WITH PROPOFOL N/A 11/29/2013   Procedure: ESOPHAGOGASTRODUODENOSCOPY (EGD) WITH PROPOFOL;  Surgeon: Danie Binder, MD;  Location: [facility];  Service: Endoscopy;  Laterality: N/A;   FOOT SURGERY Left    "something with 2nd 2."   NECK SURGERY     Fusion   POLYPECTOMY N/A 11/29/2013   Procedure: POLYPECTOMY;  Surgeon: Danie Binder, MD;  Location: [facility];  Service: Endoscopy;  Laterality: N/A;  Distal Transverse Colon x2    PR ANALYZE NEUROSTIM BRAIN, FIRST 1H  10/15/2012       PULSE GENERATOR IMPLANT Right 03/30/2018   Procedure: Right Implantable pulse generator change;  Surgeon: Erline Levine, MD;  Location: [place];  Service: Neurosurgery;  Laterality: Right;   SUBTHALAMIC STIMULATOR BATTERY REPLACEMENT N/A 08/05/2013   Procedure: SUBTHALAMIC STIMULATOR BATTERY REPLACEMENT;  Surgeon: Erline Levine, MD;  Location: [place]  [facility];  Service: Neurosurgery;  Laterality: N/A;   SUBTHALAMIC STIMULATOR BATTERY REPLACEMENT N/A 11/26/2015   Procedure: Implantable pulse generator battery change for deep brain stimulator;  Surgeon: Erline Levine, MD;  Location: [place] [facility];  Service: Neurosurgery;  Laterality: N/A;   VAGINAL HYSTERECTOMY     "partial"   WRIST SURGERY Right    after fx    Family History  Problem Relation Age of Onset   Colon cancer Paternal Uncle     Social History:  reports that she quit smoking about 4 weeks ago. Her smoking use included cigarettes. She has a 12.50 pack-year smoking history. She has never used smokeless tobacco. She reports current alcohol use. She reports that she does not use drugs.  Allergies:  Allergies  Allergen Reactions   Oxycodone Other (See Comments)  Can tolerate low dose mixed with tynlelol HALLUCINATIONS    Aspirin Nausea And Vomiting and Other (See Comments)    Severe stomach pain    Codeine Nausea Only    Makes her feel very sick     Medications: Prior to Admission medications   Medication Sig Start Date End Date Taking? Authorizing Provider  acetaminophen (TYLENOL) 500 MG tablet Take 500 mg by mouth every 6 (six) hours as needed for mild pain or moderate pain.   Yes [provider]  amLODipine (NORVASC) 10 MG tablet Take 10 mg by mouth daily.    Yes [provider]  Carbidopa-Levodopa ER (SINEMET CR) 25-100 MG tablet controlled release TAKE 1 TABLET BY MOUTH THREE TIMES A DAY Patient taking differently: Take 1 tablet by mouth every morning.  09/20/18  Yes Tat, Eustace Quail, DO  Cholecalciferol (VITAMIN D) 50 MCG (2000 UT) tablet Take 2,000 Units by mouth daily.   Yes [provider]  clopidogrel (PLAVIX) 75 MG tablet Take 75 mg by mouth daily.   Yes [provider]  DULoxetine (CYMBALTA) 60 MG capsule Take 60 mg by mouth 2 (two) times daily.    Yes [provider]  losartan (COZAAR) 100 MG tablet Take 100 mg  by mouth daily.   Yes [provider]  oxybutynin (DITROPAN) 5 MG tablet Take 5 mg by mouth 2 (two) times daily.   Yes [provider]  pramipexole (MIRAPEX) 1 MG tablet Take 1.5 mg by mouth at bedtime.  06/18/16  Yes [provider]  traZODone (DESYREL) 100 MG tablet Take 100 mg by mouth at bedtime.   Yes [provider]    Scheduled Meds:  Carbidopa-Levodopa ER  1 tablet Oral TID   clopidogrel  75 mg Oral Daily   enoxaparin (LOVENOX) injection  40 mg Subcutaneous QHS   oxybutynin  5 mg Oral BID   pramipexole  1.5 mg Oral QHS   sorbitol, milk of mag, mineral oil, glycerin (SMOG) enema  960 mL Rectal Once   Continuous Infusions:   ceFAZolin (ANCEF) IV 1 g (03/30/19 1707)   PRN Meds:.acetaminophen     Results for orders placed or performed during the hospital encounter of 03/29/19 (from the past 48 hour(s))  Urinalysis, Complete w Microscopic     Status: None   Collection Time: 03/29/19  2:33 PM  Result Value Ref Range   Color, Urine YELLOW YELLOW   APPearance CLEAR CLEAR   Specific Gravity, Urine 1.018 1.005 - 1.030   pH 5.0 5.0 - 8.0   Glucose, UA NEGATIVE NEGATIVE mg/dL   Hgb urine dipstick NEGATIVE NEGATIVE   Bilirubin Urine NEGATIVE NEGATIVE   Ketones, ur NEGATIVE NEGATIVE mg/dL   Protein, ur NEGATIVE NEGATIVE mg/dL   Nitrite NEGATIVE NEGATIVE   Leukocytes,Ua NEGATIVE NEGATIVE   RBC / HPF 0-5 0 - 5 RBC/hpf   WBC, UA 0-5 0 - 5 WBC/hpf   Bacteria, UA NONE SEEN NONE SEEN   Squamous Epithelial / LPF 0-5 0 - 5    Comment: Performed at [facility], [address] [place], [place] [address]  Urine rapid drug screen (hosp performed)     Status: Abnormal   Collection Time: 03/29/19  2:34 PM  Result Value Ref Range   Opiates NONE DETECTED NONE DETECTED   Cocaine NONE DETECTED NONE DETECTED   Benzodiazepines POSITIVE (A) NONE DETECTED   Amphetamines NONE DETECTED NONE DETECTED   Tetrahydrocannabinol NONE DETECTED NONE DETECTED    Barbiturates NONE DETECTED NONE  DETECTED    Comment: (NOTE) DRUG SCREEN FOR MEDICAL PURPOSES ONLY.  IF CONFIRMATION IS NEEDED FOR ANY PURPOSE, NOTIFY LAB WITHIN 5 DAYS. LOWEST DETECTABLE LIMITS FOR URINE DRUG SCREEN Drug Class                     Cutoff (ng/mL) Amphetamine and metabolites    1000 Barbiturate and metabolites    200 Benzodiazepine                 [id number] Tricyclics and metabolites     300 Opiates and metabolites        300 Cocaine and metabolites        300 THC                            50 Performed at [facility], [address], [place], [place] [address]   SARS CORONAVIRUS 2 (TAT 6-24 HRS) Nasopharyngeal Nasopharyngeal Swab     Status: None   Collection Time: 03/29/19  2:40 PM   Specimen: Nasopharyngeal Swab  Result Value Ref Range   SARS Coronavirus 2 NEGATIVE NEGATIVE    Comment: (NOTE) SARS-CoV-2 target nucleic acids are NOT DETECTED. The SARS-CoV-2 RNA is generally detectable in upper and lower respiratory specimens during the acute phase of infection. Negative results do not preclude SARS-CoV-2 infection, do not rule out co-infections with other pathogens, and should not be used as the sole basis for treatment or other patient management decisions. Negative results must be combined with clinical observations, patient history, and epidemiological information. The expected result is Negative. Fact Sheet for Patients: SugarRoll.be Fact Sheet for Healthcare Providers: https://www.woods-mathews.com/ This test is not yet approved or cleared by the [place] FDA and  has been authorized for detection and/or diagnosis of SARS-CoV-2 by FDA under an Emergency Use Authorization (EUA). This EUA will remain  in effect (meaning this test can be used) for the duration of the COVID-19 declaration under Section 56 4(b)(1) of the Act, 21 U.S.C. section 360bbb-3(b)(1), unless the authorization is terminated or revoked  sooner. Performed at [facility], [place] [address], [place], [place] [address]   Comprehensive metabolic panel     Status: Abnormal   Collection Time: 03/29/19  2:48 PM  Result Value Ref Range   Sodium 138 135 - 145 mmol/L   Potassium 4.0 3.5 - 5.1 mmol/L   Chloride 103 98 - 111 mmol/L   CO2 27 22 - 32 mmol/L   Glucose, Bld 103 (H) 70 - 99 mg/dL   BUN 26 (H) 8 - 23 mg/dL   Creatinine, Ser 0.97 0.44 - 1.00 mg/dL   Calcium 9.4 8.9 - 10.3 mg/dL   Total Protein 7.5 6.5 - 8.1 g/dL   Albumin 4.2 3.5 - 5.0 g/dL   AST 25 15 - 41 U/L   ALT 45 (H) 0 - 44 U/L   Alkaline Phosphatase 161 (H) 38 - 126 U/L   Total Bilirubin 0.3 0.3 - 1.2 mg/dL   GFR calc non Af Amer 58 (L) >60 mL/min   GFR calc Af Amer >60 >60 mL/min   Anion gap 8 5 - 15    Comment: Performed at [facility], [address] [place], [place] [address]  CBC WITH DIFFERENTIAL     Status: None   Collection Time: 03/29/19  2:48 PM  Result Value Ref Range   WBC 6.1 4.0 - 10.5 K/uL   RBC 4.42 3.87 -  5.11 MIL/uL   Hemoglobin 14.1 12.0 - 15.0 g/dL   HCT 42.7 36.0 - 46.0 %   MCV 96.6 80.0 - 100.0 fL   MCH 31.9 26.0 - 34.0 pg   MCHC 33.0 30.0 - 36.0 g/dL   RDW 12.1 11.5 - 15.5 %   Platelets 258 150 - 400 K/uL   nRBC 0.0 0.0 - 0.2 %   Neutrophils Relative % 51 %   Neutro Abs 3.1 1.7 - 7.7 K/uL   Lymphocytes Relative 33 %   Lymphs Abs 2.0 0.7 - 4.0 K/uL   Monocytes Relative 11 %   Monocytes Absolute 0.7 0.1 - 1.0 K/uL   Eosinophils Relative 4 %   Eosinophils Absolute 0.3 0.0 - 0.5 K/uL   Basophils Relative 1 %   Basophils Absolute 0.0 0.0 - 0.1 K/uL   Immature Granulocytes 0 %   Abs Immature Granulocytes 0.01 0.00 - 0.07 K/uL    Comment: Performed at [facility], [address] [place], [place] [address]  Ammonia     Status: None   Collection Time: 03/29/19  2:48 PM  Result Value Ref Range   Ammonia 15 9 - 35 umol/L    Comment: Performed at [facility], [address] [place], [place] [address]  Lactic acid, plasma      Status: None   Collection Time: 03/29/19  2:48 PM  Result Value Ref Range   Lactic Acid, Venous 1.3 0.5 - 1.9 mmol/L    Comment: Performed at [facility], [address] [place], [place] [address]  Ethanol     Status: None   Collection Time: 03/29/19  2:48 PM  Result Value Ref Range   Alcohol, Ethyl (B) <10 <10 mg/dL    Comment: (NOTE) Lowest detectable limit for serum alcohol is 10 mg/dL. For medical purposes only. Performed at [facility], [address], [place], [place] [address]   Blood Cultures (routine x 2)     Status: None (Preliminary result)   Collection Time: 03/29/19  2:48 PM   Specimen: BLOOD LEFT ARM  Result Value Ref Range   Specimen Description BLOOD LEFT ARM    Special Requests      BOTTLES DRAWN AEROBIC AND ANAEROBIC Blood Culture adequate volume   Culture      NO GROWTH < 24 HOURS Performed at [facility], [address], [place], [place] [address]    Report Status PENDING   Blood Cultures (routine x 2)     Status: None (Preliminary result)   Collection Time: 03/29/19  2:48 PM   Specimen: BLOOD RIGHT ARM  Result Value Ref Range   Specimen Description BLOOD RIGHT ARM    Special Requests      BOTTLES DRAWN AEROBIC AND ANAEROBIC Blood Culture adequate volume   Culture      NO GROWTH < 24 HOURS Performed at [facility], [address] [place], [place] [address]    Report Status PENDING   Basic metabolic panel     Status: Abnormal   Collection Time: 03/30/19  4:13 AM  Result Value Ref Range   Sodium 139 135 - 145 mmol/L   Potassium 4.0 3.5 - 5.1 mmol/L   Chloride 104 98 - 111 mmol/L   CO2 25 22 - 32 mmol/L   Glucose, Bld 100 (H) 70 - 99 mg/dL   BUN 21 8 - 23 mg/dL   Creatinine, Ser 0.80 0.44 - 1.00 mg/dL   Calcium 9.0 8.9 - 10.3 mg/dL   GFR calc non  Af Amer >60 >60 mL/min   GFR calc Af Amer >60 >60 mL/min   Anion gap 10 5 - 15    Comment: Performed at [facility], [address], [place], [place] [address]  MRSA PCR Screening     Status: None    Collection Time: 03/30/19  6:21 AM   Specimen: Nasopharyngeal  Result Value Ref Range   MRSA by PCR NEGATIVE NEGATIVE    Comment:        The GeneXpert MRSA Assay (FDA approved for NASAL specimens only), is one component of a comprehensive MRSA colonization surveillance program. It is not intended to diagnose MRSA infection nor to guide or monitor treatment for MRSA infections. Performed at [facility], [address], [place], [place] [address]   Lipid panel     Status: Abnormal   Collection Time: 03/30/19 12:46 PM  Result Value Ref Range   Cholesterol 178 0 - 200 mg/dL   Triglycerides 127 <150 mg/dL   HDL 45 >40 mg/dL   Total CHOL/HDL Ratio 4.0 RATIO   VLDL 25 0 - 40 mg/dL   LDL Cholesterol 108 (H) 0 - 99 mg/dL    Comment:        Total Cholesterol/HDL:CHD Risk Coronary Heart Disease Risk Table                     Men   Women  1/2 Average Risk   3.4   3.3  Average Risk       5.0   4.4  2 X Average Risk   9.6   7.1  3 X Average Risk  23.4   11.0        Use the calculated Patient Ratio above and the CHD Risk Table to determine the patient's CHD Risk.        ATP III CLASSIFICATION (LDL):  <100     mg/dL   Optimal  100-129  mg/dL   Near or Above                    Optimal  130-159  mg/dL   Borderline  160-189  mg/dL   High  >190     mg/dL   Very High Performed at [facility], [address], [place], [place] [address]     Studies/Results:  HEAD CT CSPINE CT FINDINGS: CT HEAD FINDINGS  Brain: Deep brain stimulator leads remain in place extending to the subthalamic regions bilaterally. Within limitations of artifact associated with the leads, no acute infarct, intracranial hemorrhage, mass, midline shift, or extra-axial fluid collection is identified. Hypodensities in the cerebral white matter bilaterally are similar to the prior study and nonspecific but compatible with mild chronic small vessel ischemic disease. The ventricles and sulci are within normal  limits for age.  Vascular: Calcified atherosclerosis at the skull base. No hyperdense vessel.  Skull: No fracture or focal osseous lesion.  Sinuses/Orbits: Visualized paranasal sinuses and mastoid air cells are clear. Orbits are unremarkable.  Other: None.  CT CERVICAL SPINE FINDINGS  Alignment: Slight anterolisthesis of C6 on C7, C7 on T1, and T1 on T2.  Skull base and vertebrae: No acute fracture or suspicious osseous lesion. C5-C7 ACDF with solid arthrodesis at C5-6. Lucency through the C6-7 disc space with endplate sclerosis suggesting pseudoarthrosis.  Soft tissues and spinal canal: No evidence of prevertebral soft tissue swelling within limitations of streak artifact.  Disc levels: Mild disc space narrowing at C3-4 and C7-T1 with moderate narrowing at  C4-5. Moderate multilevel facet arthrosis, right greater than left. Mild-to-moderate right neural foraminal stenosis at C4-5 due to uncovertebral and facet spurring.  Upper chest: Mild motion artifact and nonspecific mosaic attenuation in the lung apices. No pneumothorax.  Other: None.  IMPRESSION: 1. No evidence of acute intracranial abnormality. 2. Mild chronic small vessel ischemic disease. 3. No evidence of acute cervical spine fracture. 4. 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      CARIOTID DOP IMPRESSION: Color duplex indicates minimal heterogeneous and calcified plaque, with no hemodynamically significant stenosis by duplex criteria in the extracranial cerebrovascular circulation.    The head CT scan is reviewed in person and shows the brain stimulator artifact in the bifrontal region the into the basal ganglia bilaterally.  No acute evidence of hypodensity is noted.  No encephalomalacia or fluid noted.   Beatryce [place] A. Merlene Laughter, M.D.  Diplomate, Tax adviser of Psychiatry and Neurology ( Neurology). 03/30/2019, 5:13 PM

## 2019-03-30 NOTE — Progress Notes (Addendum)
PROGRESS NOTE    Courtney Tucker  [id number] DOB: [birth year] DOA: 03/29/2019 PCP: Scotty Court, DO   Brief Narrative:  Per HPI: Courtney Tucker is an 73 y.o. 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  Extensive work-up was essentially normal except for marked stool volume which was likely cause of abdominal pain.  As patient was getting ready to be discharged however patient noted that in addition to right arm pain she had had an episode where she could not use her right upper extremity.  This led to [facility] for [facility] for TIA work-up.  History as per patient however she has very difficult to understand speech so history is somewhat limited in detail.  Patient tells me that she has been very sleepy since this morning.  She is not sure if this has anything to do with her medications.  She notes that before breakfast she had an episode where she was completely unable to move her right arm.  She demonstrates that she had to lift up her right arm with her left hand in order to move it.  She also notes that she was unable to move her right fingers.  She was fed breakfast by the nursing staff at her SNF.  She also believes she had an episode of syncope while she was sitting in her chair.  She did not fall out of her chair.  She does not know how long that lasted.  She denies any injury.  Patient is not sure when her right upper extremity functioning returned however notes that by the time she came here she was able to use her right arm.  She did have some pain in her right arm which she had initially discussed with the ED provider but notes the right arm pain is much improved.  Patient sister who is at bedside  is concerned about an area of warmth and redness in her left lower extremity around a abrasion she sustained during a fall last week.   Patient herself denies any fevers or chills.  She denies feeling confused.  She does think she is much more awake now than when she came in earlier.  She does not remember the syncope episode and is not quite sure how she knows that she had syncope.  Patient denies headache, nausea or vomiting.  Patient denies any diarrhea in fact she is quite constipated.  No cough, no shortness of breath, no chest pain or palpitations.  11/4: Patient was admitted for possible TIA in the setting of advanced Parkinson's disease.  She is also noted to have left lower extremity cellulitis and has been started on IV vancomycin.  She is also noted to have marked somnolence versus syncope.  2D echocardiogram performed with LVEF 60-65% and grade 1 diastolic dysfunction.  Carotid ultrasound ordered and pending.  MRI cannot be obtained due to deep brain stimulator placement.  Neurology consultation pending.   Assessment & Plan:   Principal Problem:   TIA (transient ischemic attack) Active Problems:   Essential hypertension   Pseudobulbar affect   Depression, major   Parkinson's disease ([place])   S/P deep brain stimulator placement   Possible TIA in the setting of advanced parkinsonism -CT head with no acute findings, cannot perform brain MRI due  to deep brain stimulator -2D echocardiogram with LVEF 60 to 65% with grade 1 diastolic dysfunction noted -Bilateral carotid ultrasound ordered and pending -Continue Plavix -Check lipid panel and A1c -PT/OT evaluation ordered -Neurology consultation requested  Left lower extremity cellulitis -Appears to be mild, change IV vancomycin to Ancef -Follow-up blood cultures and can likely be on oral medications by a.m. on DC  Somnolence versus syncope -We will plan to reevaluate and avoid sedating agents -Monitor on telemetry -2D  echocardiogram without acute findings -Bilateral carotid ultrasound pending  Heavy stool burden/constipation -Smog enema  Hypertension -Currently with soft blood pressure readings -Plan to hold amlodipine and losartan  Depression -Hold duloxetine and trazodone due to somnolence/syncope  Parkinson's disease with DBS -Continue Sinemet and Mirapex -Appreciate neurology evaluation  DVT prophylaxis: Lovenox Code Status: Full Family Communication: None at bedside Disposition Plan: Plan for DC back to SNF after neurology evaluation likely in a.m.   Consultants:   Neurology  Procedures:   2D echocardiogram 11/4  Antimicrobials:  Anti-infectives (From admission, onward)   Start     Dose/Rate Route Frequency Ordered Stop   03/30/19 2200  vancomycin (VANCOCIN) IVPB 750 mg/150 ml premix     750 mg 150 mL/hr over 60 Minutes Intravenous Every 24 hours 03/29/19 2115     03/29/19 2200  vancomycin (VANCOCIN) 1,250 mg in sodium chloride 0.9 % 250 mL IVPB     1,250 mg 166.7 mL/hr over 90 Minutes Intravenous  Once 03/29/19 2115 03/30/19 0119       Subjective: Patient seen and evaluated today with no new acute complaints or concerns. No acute concerns or events noted overnight.  She is having breakfast this morning.  Objective: Vitals:   03/29/19 1800 03/29/19 2000 03/29/19 2231 03/30/19 0552  BP: 135/78 (!) 118/57 (!) 141/58 (!) 107/46  Pulse: 86 78 73 66  Resp: 14 17 16 16   Temp:   98.2 F (36.8 C) 98 F (36.7 C)  TempSrc:   Oral Oral  SpO2: 99% 94% 97% 96%  Weight:      Height:        Intake/Output Summary (Last 24 hours) at 03/30/2019 1158 Last data filed at 03/30/2019 0600 Gross per 24 hour  Intake --  Output 150 ml  Net -150 ml   Filed Weights   03/29/19 1420  Weight: 67.1 kg    Examination:  General exam: Appears calm and comfortable  Respiratory system: Clear to auscultation. Respiratory effort normal. Cardiovascular system: S1 & S2 heard, RRR. No JVD,  murmurs, rubs, gallops or clicks. No pedal edema. Gastrointestinal system: Abdomen is nondistended, soft and nontender. No organomegaly or masses felt. Normal bowel sounds heard. Central nervous system: Alert and oriented. No focal neurological deficits. Extremities: Symmetric 5 x 5 power. Skin: No rashes, lesions or ulcers.  Left lower extremity with erythema noted over the anterior portion of the shin. Psychiatry: Judgement and insight appear normal. Mood & affect appropriate.     Data Reviewed: I have personally reviewed following labs and imaging studies  CBC: Recent Labs  Lab 03/29/19 1448  WBC 6.1  NEUTROABS 3.1  HGB 14.1  HCT 42.7  MCV 96.6  PLT 0000000   Basic Metabolic Panel: Recent Labs  Lab 03/29/19 1448 03/30/19 0413  NA 138 139  K 4.0 4.0  CL 103 104  CO2 27 25  GLUCOSE 103* 100*  BUN 26* 21  CREATININE 0.97 0.80  CALCIUM 9.4 9.0   GFR: Estimated Creatinine Clearance: 56.3 mL/min (by C-G formula  based on SCr of 0.8 mg/dL). Liver Function Tests: Recent Labs  Lab 03/29/19 1448  AST 25  ALT 45*  ALKPHOS 161*  BILITOT 0.3  PROT 7.5  ALBUMIN 4.2   No results for input(s): LIPASE, AMYLASE in the last 168 hours. Recent Labs  Lab 03/29/19 1448  AMMONIA 15   Coagulation Profile: No results for input(s): INR, PROTIME in the last 168 hours. Cardiac Enzymes: No results for input(s): CKTOTAL, CKMB, CKMBINDEX, TROPONINI in the last 168 hours. BNP (last 3 results) No results for input(s): PROBNP in the last 8760 hours. HbA1C: No results for input(s): HGBA1C in the last 72 hours. CBG: No results for input(s): GLUCAP in the last 168 hours. Lipid Profile: No results for input(s): CHOL, HDL, LDLCALC, TRIG, CHOLHDL, LDLDIRECT in the last 72 hours. Thyroid Function Tests: No results for input(s): TSH, T4TOTAL, FREET4, T3FREE, THYROIDAB in the last 72 hours. Anemia Panel: No results for input(s): VITAMINB12, FOLATE, FERRITIN, TIBC, IRON, RETICCTPCT in the last  72 hours. Sepsis Labs: Recent Labs  Lab 03/29/19 1448  LATICACIDVEN 1.3    Recent Results (from the past 240 hour(s))  SARS CORONAVIRUS 2 (TAT 6-24 HRS) Nasopharyngeal Nasopharyngeal Swab     Status: None   Collection Time: 03/29/19  2:40 PM   Specimen: Nasopharyngeal Swab  Result Value Ref Range Status   SARS Coronavirus 2 NEGATIVE NEGATIVE Final    Comment: (NOTE) SARS-CoV-2 target nucleic acids are NOT DETECTED. The SARS-CoV-2 RNA is generally detectable in upper and lower respiratory specimens during the acute phase of infection. Negative results do not preclude SARS-CoV-2 infection, do not rule out co-infections with other pathogens, and should not be used as the sole basis for treatment or other patient management decisions. Negative results must be combined with clinical observations, patient history, and epidemiological information. The expected result is Negative. Fact Sheet for Patients: SugarRoll.be Fact Sheet for Healthcare Providers: https://www.woods-mathews.com/ This test is not yet approved or cleared by the [place] FDA and  has been authorized for detection and/or diagnosis of SARS-CoV-2 by FDA under an Emergency Use Authorization (EUA). This EUA will remain  in effect (meaning this test can be used) for the duration of the COVID-19 declaration under Section 56 4(b)(1) of the Act, 21 U.S.C. section 360bbb-3(b)(1), unless the authorization is terminated or revoked sooner. Performed at [facility], [place] [address] [place], [place] [address]   Blood Cultures (routine x 2)     Status: None (Preliminary result)   Collection Time: 03/29/19  2:48 PM   Specimen: BLOOD LEFT ARM  Result Value Ref Range Status   Specimen Description BLOOD LEFT ARM  Final   Special Requests   Final    BOTTLES DRAWN AEROBIC AND ANAEROBIC Blood Culture adequate volume Performed at [facility], [address] [place], [place]  [address]    Culture PENDING  Incomplete   Report Status PENDING  Incomplete  Blood Cultures (routine x 2)     Status: None (Preliminary result)   Collection Time: 03/29/19  2:48 PM   Specimen: BLOOD RIGHT ARM  Result Value Ref Range Status   Specimen Description BLOOD RIGHT ARM  Final   Special Requests   Final    BOTTLES DRAWN AEROBIC AND ANAEROBIC Blood Culture adequate volume Performed at [facility], [address] [place], [place] [address]    Culture PENDING  Incomplete   Report Status PENDING  Incomplete  MRSA PCR Screening     Status: None   Collection  Time: 03/30/19  6:21 AM   Specimen: Nasopharyngeal  Result Value Ref Range Status   MRSA by PCR NEGATIVE NEGATIVE Final    Comment:        The GeneXpert MRSA Assay (FDA approved for NASAL specimens only), is one component of a comprehensive MRSA colonization surveillance program. It is not intended to diagnose MRSA infection nor to guide or monitor treatment for MRSA infections. Performed at [facility], [address] [place], [place] [address]          Radiology Studies: Dg Chest 1 View  Result Date: 03/29/2019 CLINICAL DATA:  Altered mental status EXAM: CHEST  1 VIEW COMPARISON:  12/30/2018 FINDINGS: Cardiac shadow is stable. Stimulator pack is again seen over the right chest wall. The lungs are well aerated bilaterally. No focal infiltrate or sizable effusion is seen. No acute bony abnormality is noted. Postsurgical changes in the cervical spine are seen. IMPRESSION: No acute abnormality noted. Electronically Signed   By: Inez Catalina M.D.   On: 03/29/2019 15:44   Ct Head Wo Contrast  Result Date: 03/29/2019 CLINICAL DATA:  Syncopal episode today as well as a fall 2 weeks ago. Right arm and rib pain. Right arm weakness. EXAM: CT HEAD WITHOUT CONTRAST CT CERVICAL SPINE WITHOUT CONTRAST TECHNIQUE: Multidetector CT imaging of the head and cervical spine was performed following the standard protocol without  intravenous contrast. Multiplanar CT image reconstructions of the cervical spine were also generated. COMPARISON:  Head CT 08/05/2017. FINDINGS: CT HEAD FINDINGS Brain: Deep brain stimulator leads remain in place extending to the subthalamic regions bilaterally. Within limitations of artifact associated with the leads, no acute infarct, intracranial hemorrhage, mass, midline shift, or extra-axial fluid collection is identified. Hypodensities in the cerebral white matter bilaterally are similar to the prior study and nonspecific but compatible with mild chronic small vessel ischemic disease. The ventricles and sulci are within normal limits for age. Vascular: Calcified atherosclerosis at the skull base. No hyperdense vessel. Skull: No fracture or focal osseous lesion. Sinuses/Orbits: Visualized paranasal sinuses and mastoid air cells are clear. Orbits are unremarkable. Other: None. CT CERVICAL SPINE FINDINGS Alignment: Slight anterolisthesis of C6 on C7, C7 on T1, and T1 on T2. Skull base and vertebrae: No acute fracture or suspicious osseous lesion. C5-C7 ACDF with solid arthrodesis at C5-6. Lucency through the C6-7 disc space with endplate sclerosis suggesting pseudoarthrosis. Soft tissues and spinal canal: No evidence of prevertebral soft tissue swelling within limitations of streak artifact. Disc levels: Mild disc space narrowing at C3-4 and C7-T1 with moderate narrowing at C4-5. Moderate multilevel facet arthrosis, right greater than left. Mild-to-moderate right neural foraminal stenosis at C4-5 due to uncovertebral and facet spurring. Upper chest: Mild motion artifact and nonspecific mosaic attenuation in the lung apices. No pneumothorax. Other: None. IMPRESSION: 1. No evidence of acute intracranial abnormality. 2. Mild chronic small vessel ischemic disease. 3. No evidence of acute cervical spine fracture. 4. 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 Electronically Signed    By: Logan Bores M.D.   On: 03/29/2019 16:45   Ct Cervical Spine Wo Contrast  Result Date: 03/29/2019 CLINICAL DATA:  Syncopal episode today as well as a fall 2 weeks ago. Right arm and rib pain. Right arm weakness. EXAM: CT HEAD WITHOUT CONTRAST CT CERVICAL SPINE WITHOUT CONTRAST TECHNIQUE: Multidetector CT imaging of the head and cervical spine was performed following the standard protocol without intravenous contrast. Multiplanar CT image reconstructions of the cervical spine were  also generated. COMPARISON:  Head CT 08/05/2017. FINDINGS: CT HEAD FINDINGS Brain: Deep brain stimulator leads remain in place extending to the subthalamic regions bilaterally. Within limitations of artifact associated with the leads, no acute infarct, intracranial hemorrhage, mass, midline shift, or extra-axial fluid collection is identified. Hypodensities in the cerebral white matter bilaterally are similar to the prior study and nonspecific but compatible with mild chronic small vessel ischemic disease. The ventricles and sulci are within normal limits for age. Vascular: Calcified atherosclerosis at the skull base. No hyperdense vessel. Skull: No fracture or focal osseous lesion. Sinuses/Orbits: Visualized paranasal sinuses and mastoid air cells are clear. Orbits are unremarkable. Other: None. CT CERVICAL SPINE FINDINGS Alignment: Slight anterolisthesis of C6 on C7, C7 on T1, and T1 on T2. Skull base and vertebrae: No acute fracture or suspicious osseous lesion. C5-C7 ACDF with solid arthrodesis at C5-6. Lucency through the C6-7 disc space with endplate sclerosis suggesting pseudoarthrosis. Soft tissues and spinal canal: No evidence of prevertebral soft tissue swelling within limitations of streak artifact. Disc levels: Mild disc space narrowing at C3-4 and C7-T1 with moderate narrowing at C4-5. Moderate multilevel facet arthrosis, right greater than left. Mild-to-moderate right neural foraminal stenosis at C4-5 due to  uncovertebral and facet spurring. Upper chest: Mild motion artifact and nonspecific mosaic attenuation in the lung apices. No pneumothorax. Other: None. IMPRESSION: 1. No evidence of acute intracranial abnormality. 2. Mild chronic small vessel ischemic disease. 3. No evidence of acute cervical spine fracture. 4. 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 Electronically Signed   By: Logan Bores M.D.   On: 03/29/2019 16:45   Ct Abdomen Pelvis W Contrast  Result Date: 03/29/2019 CLINICAL DATA:  Right-sided abdominal pain. Syncopal episode. EXAM: CT ABDOMEN AND PELVIS WITH CONTRAST TECHNIQUE: Multidetector CT imaging of the abdomen and pelvis was performed using the standard protocol following bolus administration of intravenous contrast. CONTRAST:  130mL OMNIPAQUE IOHEXOL 300 MG/ML  SOLN COMPARISON:  None. FINDINGS: Lower Chest: No acute findings. Hepatobiliary: No hepatic masses identified. Prior cholecystectomy. No evidence of biliary obstruction. Pancreas:  No mass or inflammatory changes. Spleen: Within normal limits in size and appearance. Adrenals/Urinary Tract: No masses identified. A 2 mm calculus is seen in the upper pole of the right kidney. No evidence of ureteral calculi or hydronephrosis. Unremarkable unopacified urinary bladder. Stomach/Bowel: Small hiatal hernia. No evidence of obstruction, inflammatory process or abnormal fluid collections. Normal appendix visualized. Large stool burden noted throughout the colon. Vascular/Lymphatic: No pathologically enlarged lymph nodes. No abdominal aortic aneurysm. Reproductive: Prior hysterectomy noted. Adnexal regions are unremarkable in appearance. Other:  None. Musculoskeletal:  No suspicious bone lesions identified. IMPRESSION: No evidence of appendicitis or other acute findings. Large stool burden noted; recommend clinical correlation for possible constipation. Tiny nonobstructing right renal calculus. Small hiatal  hernia. Electronically Signed   By: Marlaine Hind M.D.   On: 03/29/2019 18:08        Scheduled Meds:  Carbidopa-Levodopa ER  1 tablet Oral TID   clopidogrel  75 mg Oral Daily   DULoxetine  60 mg Oral BID   enoxaparin (LOVENOX) injection  40 mg Subcutaneous QHS   oxybutynin  5 mg Oral BID   pramipexole  1.5 mg Oral QHS   traZODone  100 mg Oral QHS   Continuous Infusions:  vancomycin       LOS: 0 days    Time spent: 30 minutes    Daine Croker Darleen Crocker, DO [facility] Pager [phone]  If 7PM-7AM, please contact night-coverage [email or web address] Password [email or web address] 03/30/2019, 11:58 AM

## 2019-03-31 MED ORDER — LOSARTAN POTASSIUM 50 MG PO TABS
100.0000 mg | ORAL_TABLET | Freq: Every day | ORAL | Status: DC
  Administered 2019-04-01 – 2019-04-04 (×4): 100 mg via ORAL
  Filled 2019-03-31 (×4): qty 2

## 2019-03-31 MED ORDER — AMLODIPINE BESYLATE 5 MG PO TABS
10.0000 mg | ORAL_TABLET | Freq: Every day | ORAL | Status: DC
  Administered 2019-04-01 – 2019-04-04 (×4): 10 mg via ORAL
  Filled 2019-03-31 (×4): qty 2

## 2019-03-31 MED ORDER — DULOXETINE HCL 60 MG PO CPEP
60.0000 mg | ORAL_CAPSULE | Freq: Two times a day (BID) | ORAL | Status: DC
  Administered 2019-03-31 – 2019-04-04 (×8): 60 mg via ORAL
  Filled 2019-03-31 (×9): qty 1

## 2019-03-31 MED ORDER — CEPHALEXIN 250 MG PO CAPS
250.0000 mg | ORAL_CAPSULE | Freq: Four times a day (QID) | ORAL | Status: DC
  Administered 2019-03-31 – 2019-04-04 (×16): 250 mg via ORAL
  Filled 2019-03-31 (×16): qty 1

## 2019-03-31 MED ORDER — TRAZODONE HCL 50 MG PO TABS
100.0000 mg | ORAL_TABLET | Freq: Every day | ORAL | Status: DC
  Administered 2019-03-31 – 2019-04-03 (×4): 100 mg via ORAL
  Filled 2019-03-31 (×4): qty 2

## 2019-03-31 NOTE — Progress Notes (Signed)
Courtney A. Merlene Laughter, MD     [email or web address]          Courtney Tucker is an 73 y.o. female.   Assessment/Plan: 1.  Episode of syncope/loss of consciousness associated with right upper extremity weakness.  Etiology unclear but given the amnesia and on clear loss of time seizure is a possibility.  Given the patient's history of Parkinson disease and multiple medications orthostatic hypotension is also possible.  It is also possible that she may have had an infarct but this was not seen on imaging and would be unlikely to [place] because of loss of consciousness.  An EEG is suggested.     She reports that overall she is about the same.  EEG was attempted but there was issues with the equipment.  This will be attempted again tomorrow.  Orthostatics suggest that she may have postural orthostatic hypotension tachycardia syndrome.  This is only on one reading below.       ORTHOSTATICS LYING DOWN -   Blood pressure 132/65  Heart rate 85 STANDING -   Blood pressure 146/83 heart rate 94   LYING DOWN -    Blood pressure 118 /51 heart rate 73 STANDING -   Blood pressure 119/91 heart rate 109       GENERAL: This a very pleasant female who is in discomfort but no acute distress.  HEENT: The neck is supple and no obvious trauma appreciated.  I do not see clear evidence of tongue trauma or lip trauma.  There is reduced eye blink rate noted.  ABDOMEN: soft  EXTREMITIES: No edema   BACK: Normal  SKIN: Normal by inspection.    MENTAL STATUS: She is awake, lucid and conversational.  She has severe dysarthria.  She does follow commands briskly.  CRANIAL NERVES: Pupils are equal, round and reactive to light and accomodation; extra ocular movements are full, there is no significant nystagmus; visual fields are full; upper and lower facial muscles are normal in strength and symmetric, there is mild flattening of the nasolabial fold on the left side; tongue is  midline; uvula is midline; shoulder elevation is normal.  MOTOR: She is noted to have episodic increased tone of the legs bilaterally.  She tells me this happens suggestive of clonus.  Left hip flexion is 4 and right 4+.  Right dorsiflexion is 4+; left 4+.  Deltoids above 4/5.  Triceps 4+ and biceps 5.  Hand grips are 5.  COORDINATION: Left finger to nose is normal, right finger to nose is normal, No rest tremor; no intention tremor; no postural tremor; there is moderate to severe cogwheeling bilaterally.  There is a markedly impaired hand dexterity as evidenced by impaired finger tapping and rapid hand opening closing maneuver.  SENSATION: Normal to pain.        Objective: Vital signs in last 24 hours: Temp:  [98.2 F (36.8 C)-99 F (37.2 C)] 98.6 F (37 C) (11/05 0610) Pulse Rate:  [62-109] 62 (11/05 0610) Resp:  [16-17] 17 (11/05 0610) BP: (118-154)/(51-91) 154/81 (11/05 0610) SpO2:  [89 %-100 %] 98 % (11/05 0811)  Intake/Output from previous day: 11/04 0701 - 11/05 0700 In: 340 [P.O.:240; IV Piggyback:100] Out: -  Intake/Output this shift: Total I/O In: 280 [P.O.:230; IV Piggyback:50] Out: 600 [Urine:600] Nutritional status:  Diet Order            DIET DYS 3 Room service appropriate? Yes; Fluid consistency: Thin  Diet effective now  Lab Results: Results for orders placed or performed during the hospital encounter of 03/29/19 (from the past 48 hour(s))  Basic metabolic panel     Status: Abnormal   Collection Time: 03/30/19  4:13 AM  Result Value Ref Range   Sodium 139 135 - 145 mmol/L   Potassium 4.0 3.5 - 5.1 mmol/L   Chloride 104 98 - 111 mmol/L   CO2 25 22 - 32 mmol/L   Glucose, Bld 100 (H) 70 - 99 mg/dL   BUN 21 8 - 23 mg/dL   Creatinine, Ser 0.80 0.44 - 1.00 mg/dL   Calcium 9.0 8.9 - 10.3 mg/dL   GFR calc non Af Amer >60 >60 mL/min   GFR calc Af Amer >60 >60 mL/min   Anion gap 10 5 - 15    Comment: Performed at [facility], [address], [place], [place] [address]  MRSA PCR Screening     Status: None   Collection Time: 03/30/19  6:21 AM   Specimen: Nasopharyngeal  Result Value Ref Range   MRSA by PCR NEGATIVE NEGATIVE    Comment:        The GeneXpert MRSA Assay (FDA approved for NASAL specimens only), is one component of a comprehensive MRSA colonization surveillance program. It is not intended to diagnose MRSA infection nor to guide or monitor treatment for MRSA infections. Performed at [facility], [address], [place], [place] [address]   Lipid panel     Status: Abnormal   Collection Time: 03/30/19 12:46 PM  Result Value Ref Range   Cholesterol 178 0 - 200 mg/dL   Triglycerides 127 <150 mg/dL   HDL 45 >40 mg/dL   Total CHOL/HDL Ratio 4.0 RATIO   VLDL 25 0 - 40 mg/dL   LDL Cholesterol 108 (H) 0 - 99 mg/dL    Comment:        Total Cholesterol/HDL:CHD Risk Coronary Heart Disease Risk Table                     Men   Women  1/2 Average Risk   3.4   3.3  Average Risk       5.0   4.4  2 X Average Risk   9.6   7.1  3 X Average Risk  23.4   11.0        Use the calculated Patient Ratio above and the CHD Risk Table to determine the patient's CHD Risk.        ATP III CLASSIFICATION (LDL):  <100     mg/dL   Optimal  100-129  mg/dL   Near or Above                    Optimal  130-159  mg/dL   Borderline  160-189  mg/dL   High  >190     mg/dL   Very High Performed at [facility], [address], [place], [place] [address]   Hemoglobin A1c     Status: None   Collection Time: 03/30/19 12:46 PM  Result Value Ref Range   Hgb A1c MFr Bld 5.6 4.8 - 5.6 %    Comment: (NOTE) Pre diabetes:          5.7%-6.4% Diabetes:              >6.4% Glycemic control for   <7.0% adults with diabetes    Mean Plasma Glucose 114.02 mg/dL    Comment: Performed at [facility], [address] [place], [place] [address]    Lipid Panel Recent Labs    03/30/19 1246  CHOL 178  TRIG 127  HDL 45   CHOLHDL 4.0  VLDL 25  LDLCALC 108*    Studies/Results:   Medications:  Scheduled Meds: . [START ON 04/01/2019] amLODipine  10 mg Oral Daily  . Carbidopa-Levodopa ER  1 tablet Oral TID  . cephALEXin  250 mg Oral Q6H  . clopidogrel  75 mg Oral Daily  . DULoxetine  60 mg Oral BID  . enoxaparin (LOVENOX) injection  40 mg Subcutaneous QHS  . [START ON 04/01/2019] losartan  100 mg Oral Daily  . oxybutynin  5 mg Oral BID  . pramipexole  1.5 mg Oral QHS  . sorbitol, milk of mag, mineral oil, glycerin (SMOG) enema  960 mL Rectal Once  . traZODone  100 mg Oral QHS   Continuous Infusions: PRN Meds:.acetaminophen     LOS: 1 day   Kofi A. Merlene Tucker, M.D.  Diplomate, Tax adviser of Psychiatry and Neurology ( Neurology).

## 2019-03-31 NOTE — Progress Notes (Signed)
Physical Therapy Treatment Patient Details Name: Courtney Tucker MRN: [id number] DOB: 04-14-1946 Today's Date: 03/31/2019    History of Present Illness Courtney Tucker is an 73 y.o. 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  Extensive work-up was essentially normal except for marked stool volume which was likely cause of abdominal pain.  As patient was getting ready to be discharged however patient noted that in addition to right arm pain she had had an episode where she could not use her right upper extremity.  This led to referral to [facility] hospital[facility] for admission for TIA work-up.    PT Comments    Patient presents in bed with nursing and ALF aides present. Patient required Mod A for supine to sit transfer, but with improved return with weight shifting. Patient tolerated seated at EOB without complaint, but did require Mod verbal and tactile cueing for approximating feet with floor while seated. Patient was able to maintain seated at EOB without UE support once feet were on floor. Patient required Mod-Max A to perform sit to stand, and Max verbal cueing for proper hand placement. Patient was able to stand with Mod A, using RW, for approximately 30 seconds before having to sit due to fatigue in BLEs. Patient then transferred from bed to chair, using Max A hand hold assist, but was able to take 2-3 small sidesteps to chair with Max A with weight shifting to advance lower extremity. Patient showed good return with stand to sit, with controlled descent into chair using RUE. Patient then educated on and performed seated BLE strengthening exercise, with encouragement to sit upright/ avoid using lumbar support for stability. Patient left in chair  with phone and call bell in reach, nursing staff and ALF aides informed of current mobility status. Patient will benefit from continued physical therapy in hospital and recommended venue below to increase strength, balance, endurance for safe ADLs and gait.     Follow Up Recommendations  SNF;Supervision/Assistance - 24 hour;Supervision for mobility/OOB     Equipment Recommendations       Recommendations for Other Services       Precautions / Restrictions Precautions Precautions: Fall Restrictions Weight Bearing Restrictions: No    Mobility  Bed Mobility Overal bed mobility: Needs Assistance Bed Mobility: Supine to Sit     Supine to sit: Max assist;Mod assist     General bed mobility comments: Improved weight shifting  Transfers Overall transfer level: Needs assistance Equipment used: 1 person hand held assist Transfers: Sit to/from [employer] Transfers Sit to Stand: Mod assist Stand pivot transfers: Max assist;Mod assist       General transfer comment: improved weight shifting  Ambulation/Gait Ambulation/Gait assistance: Max assist Gait Distance (Feet): 3 Feet Assistive device: 1 person hand held assist Gait Pattern/deviations: Decreased step length - left;Decreased stance time - right     General Gait Details: Patient was able to take 2-3 small sidesteps from bed to chair with Max A, and Mod tactile cueing for appropriate weight shift when advancing lower extremities   Stairs             Wheelchair Mobility    Modified Rankin (Stroke Patients Only)       Balance Overall balance assessment: Needs assistance Sitting-balance support: Feet supported;No upper extremity supported Sitting balance-Leahy Scale:  Fair Sitting balance - Comments: Seated at EOB   Standing balance support: Bilateral upper extremity supported Standing balance-Leahy Scale: Poor Standing balance comment: Standing with RW                             Cognition Arousal/Alertness: Awake/alert Behavior During Therapy: WFL for tasks assessed/performed Overall Cognitive Status: Within Functional Limits for tasks assessed                                        Exercises General Exercises - Lower Extremity Ankle Circles/Pumps: Both;15 reps;Seated;AROM;Strengthening Long Arc Quad: AROM;Strengthening;Seated;15 reps;Both Hip Flexion/Marching: AROM;Strengthening;Seated;15 reps;Both    General Comments        Pertinent Vitals/Pain Pain Assessment: No/denies pain    Home Living                      Prior Function            PT Goals (current goals can now be found in the care plan section) Acute Rehab PT Goals Patient Stated Goal: Return to assisted living facility PT Goal Formulation: With patient Time For Goal Achievement: 04/13/19 Potential to Achieve Goals: Good Progress towards PT goals: Progressing toward goals    Frequency    Min 3X/week      PT Plan Current plan remains appropriate    Co-evaluation              AM-PAC PT "6 Clicks" Mobility   Outcome Measure  Help needed turning from your back to your side while in a flat bed without using bedrails?: A Lot Help needed moving from lying on your back to sitting on the side of a flat bed without using bedrails?: A Lot Help needed moving to and from a bed to a chair (including a wheelchair)?: A Lot Help needed standing up from a chair using your arms (e.g., wheelchair or bedside chair)?: A Lot Help needed to walk in hospital room?: A Lot Help needed climbing 3-5 steps with a railing? : Total 6 Click Score: 11    End of Session Equipment Utilized During Treatment: Gait belt Activity Tolerance: Patient tolerated treatment well;Patient limited by fatigue Patient left: in chair;with call bell/phone within reach;with nursing/sitter in room Nurse Communication: Mobility status PT Visit Diagnosis: Unsteadiness on feet (R26.81);Muscle  weakness (generalized) (M62.81);Other abnormalities of gait and mobility (R26.89);Difficulty in walking, not elsewhere classified (R26.2)     Time: [id number] PT Time Calculation (min) (ACUTE ONLY): 25 min  Charges:  $Therapeutic Exercise: 23-37 mins                     12:05 PM, 03/31/19 Josue Hector PT DPT  Physical Therapist with [facility]  [phone]

## 2019-03-31 NOTE — Progress Notes (Signed)
PROGRESS NOTE    Courtney Tucker  [id number] DOB: 1946-05-26 DOA: 03/29/2019 PCP: Scotty Court, DO   Brief Narrative:  Per HPI: Courtney Gillyard McDanielis an 73 y.o.femalewith past medical history significant for advanced Parkinson's disease with deep brain stimulator, dysphagia, severe dysarthria and pseudobulbar affect, hypertension,GERD and previous TIA who was evaluated in the ED for complaints of syncope, somnolence abdominal pain and right arm pain. Extensive work-up was essentially normal except for marked stool volume which was likely cause of abdominal pain. As patient was getting ready to be discharged however patient noted that in addition to right arm pain she had had an episode where she could not use her right upper extremity. This led to referral to [facility] for admission for TIA work-up.  History as per patient however she has very difficult to understand speech so history is somewhat limited in detail.  Patient tells me that she has been very sleepy since this morning. She is not sure if this has anything to do with her medications. She notes that before breakfast she had an episode where she was completely unable to move her right arm. She demonstrates that she had to lift up her right arm with her left hand in order to move it. She also notes that she was unable to move her right fingers. She was fed breakfast by the nursing staff at her SNF. She also believes she had an episode of syncope while she was sitting in her chair. She did not fall out of her chair. She does not know how long that lasted. She denies any injury. Patient is not sure when her right upper extremity functioning returned however notes that by the time she came here she was able to use her right arm. She did have some pain in her right arm which she had initially discussed with the ED provider but notes the right arm pain is much improved.  Patient sister who is at bedside  is concerned about an area of warmth and redness in her left lower extremity around a abrasion she sustained during a fall last week.   Patient herself denies any fevers or chills. She denies feeling confused. She does think she is much more awake now than when she came in earlier. She does not remember the syncope episode and is not quite sure how she knows that she had syncope. Patient denies headache, nausea or vomiting. Patient denies any diarrhea in fact she is quite constipated. No cough, no shortness of breath, no chest pain or palpitations.  11/4: Patient was admitted for possible TIA in the setting of advanced Parkinson's disease.  She is also noted to have left lower extremity cellulitis and has been started on IV vancomycin.  She is also noted to have marked somnolence versus syncope.  2D echocardiogram performed with LVEF 60-65% and grade 1 diastolic dysfunction.  Carotid ultrasound ordered and pending.  MRI cannot be obtained due to deep brain stimulator placement.  Neurology consultation pending.  11/5: [place] neurology evaluation on 11/4 with plans for EEG which are still pending at the moment.  Orthostatic vitals within normal limits otherwise.  Plan for [facility] back to [facility] [place] [facility] once EEG tests have returned hopefully in a.m.  Assessment & Plan:   Principal Problem:   TIA (transient ischemic attack) Active Problems:   Essential hypertension   Pseudobulbar affect   Depression, major   Parkinson's disease ([place])   S/P deep brain stimulator placement  Syncope   Possible TIA in the setting of advanced parkinsonism -CT head with no acute findings, cannot perform brain MRI due to deep brain stimulator -2D echocardiogram with LVEF 60 to 65% with grade 1 diastolic dysfunction noted -Bilateral carotid ultrasound ordered and pending -Continue Plavix -Lipid panel with LDL 108 and A1c 5.6% -PT/OT evaluation ordered -Neurology consultation  appreciated with EEG pending  Left lower extremity cellulitis -Appears to be mild, blood cultures with no growth in 2 days and MRSA PCR negative -Plan to change to Keflex today  Somnolence versus syncope -We will plan to reevaluate and avoid sedating agents -Monitor on telemetry -2D echocardiogram without acute findings -Bilateral carotid ultrasound without any acute findings  Heavy stool burden/constipation -Smog enema as needed -Patient is having BM last on 11/3  Hypertension -Currently with soft blood pressure readings -Resume home amlodipine and losartan  Depression -Resume duloxetine and trazodone  Parkinson's disease with DBS -Continue Sinemet and Mirapex -Appreciate neurology evaluation  DVT prophylaxis: Lovenox Code Status: Full Family Communication: None at bedside Disposition Plan: Plan for DC back to [facility] after EEG.  Hopefully by a.m.   Consultants:   Neurology  Procedures:   2D echocardiogram 11/4  Antimicrobials:  Anti-infectives (From admission, onward)   Start     Dose/Rate Route Frequency Ordered Stop   03/30/19 2200  vancomycin (VANCOCIN) IVPB 750 mg/150 ml premix  Status:  Discontinued     750 mg 150 mL/hr over 60 Minutes Intravenous Every 24 hours 03/29/19 2115 03/30/19 1303   03/30/19 1800  ceFAZolin (ANCEF) IVPB 1 g/50 mL premix     1 g 100 mL/hr over 30 Minutes Intravenous Every 8 hours 03/30/19 1303     03/29/19 2200  vancomycin (VANCOCIN) 1,250 mg in sodium chloride 0.9 % 250 mL IVPB     1,250 mg 166.7 mL/hr over 90 Minutes Intravenous  Once 03/29/19 2115 03/30/19 0119        Subjective: Patient seen and evaluated today with no new acute complaints or concerns. No acute concerns or events noted overnight.  She is eager to go back home.  Objective: Vitals:   03/30/19 2028 03/31/19 0455 03/31/19 0610 03/31/19 0811  BP: (!) 143/68 127/63 (!) 154/81   Pulse: 89 74 62   Resp:  16 17   Temp:  98.2 F (36.8 C)  98.6 F (37 C)   TempSrc:  Oral    SpO2: 97% 95% 100% 98%  Weight:      Height:        Intake/Output Summary (Last 24 hours) at 03/31/2019 1500 Last data filed at 03/31/2019 0400 Gross per 24 hour  Intake 340 ml  Output --  Net 340 ml   Filed Weights   03/29/19 1420  Weight: 67.1 kg    Examination:  General exam: Appears calm and comfortable  Respiratory system: Clear to auscultation. Respiratory effort normal. Cardiovascular system: S1 & S2 heard, RRR. No JVD, murmurs, rubs, gallops or clicks. No pedal edema. Gastrointestinal system: Abdomen is nondistended, soft and nontender. No organomegaly or masses felt. Normal bowel sounds heard. Central nervous system: Alert and oriented. No focal neurological deficits. Extremities: Symmetric 5 x 5 power. Skin: No rashes, lesions or ulcers Psychiatry: Judgement and insight appear normal. Mood & affect appropriate.     Data Reviewed: I have personally reviewed following labs and imaging studies  CBC: Recent Labs  Lab 03/29/19 1448  WBC 6.1  NEUTROABS 3.1  HGB 14.1  HCT 42.7  MCV 96.6  PLT 0000000   Basic Metabolic Panel: Recent Labs  Lab 03/29/19 1448 03/30/19 0413  NA 138 139  K 4.0 4.0  CL 103 104  CO2 27 25  GLUCOSE 103* 100*  BUN 26* 21  CREATININE 0.97 0.80  CALCIUM 9.4 9.0   GFR: Estimated Creatinine Clearance: 56.3 mL/min (by C-G formula based on SCr of 0.8 mg/dL). Liver Function Tests: Recent Labs  Lab 03/29/19 1448  AST 25  ALT 45*  ALKPHOS 161*  BILITOT 0.3  PROT 7.5  ALBUMIN 4.2   No results for input(s): LIPASE, AMYLASE in the last 168 hours. Recent Labs  Lab 03/29/19 1448  AMMONIA 15   Coagulation Profile: No results for input(s): INR, PROTIME in the last 168 hours. Cardiac Enzymes: No results for input(s): CKTOTAL, CKMB, CKMBINDEX, TROPONINI in the last 168 hours. BNP (last 3 results) No results for input(s): PROBNP in the last 8760 hours. HbA1C: Recent Labs    03/30/19 1246   HGBA1C 5.6   CBG: Recent Labs  Lab 03/29/19 1515  GLUCAP 87   Lipid Profile: Recent Labs    03/30/19 1246  CHOL 178  HDL 45  LDLCALC 108*  TRIG 127  CHOLHDL 4.0   Thyroid Function Tests: No results for input(s): TSH, T4TOTAL, FREET4, T3FREE, THYROIDAB in the last 72 hours. Anemia Panel: No results for input(s): VITAMINB12, FOLATE, FERRITIN, TIBC, IRON, RETICCTPCT in the last 72 hours. Sepsis Labs: Recent Labs  Lab 03/29/19 1448  LATICACIDVEN 1.3    Recent Results (from the past 240 hour(s))  Urine culture     Status: Abnormal   Collection Time: 03/29/19  2:35 PM   Specimen: Urine, Catheterized  Result Value Ref Range Status   Specimen Description   Final    URINE, CATHETERIZED Performed at [facility], [address] [place], [place] [address]    Special Requests   Final    NONE Performed at [facility], [address], [place], [place] [address]    Culture MULTIPLE SPECIES PRESENT, SUGGEST RECOLLECTION (A)  Final   Report Status 03/30/2019 FINAL  Final  SARS CORONAVIRUS 2 (TAT 6-24 HRS) Nasopharyngeal Nasopharyngeal Swab     Status: None   Collection Time: 03/29/19  2:40 PM   Specimen: Nasopharyngeal Swab  Result Value Ref Range Status   SARS Coronavirus 2 NEGATIVE NEGATIVE Final    Comment: (NOTE) SARS-CoV-2 target nucleic acids are NOT DETECTED. The SARS-CoV-2 RNA is generally detectable in upper and lower respiratory specimens during the acute phase of infection. Negative results do not preclude SARS-CoV-2 infection, do not rule out co-infections with other pathogens, and should not be used as the sole basis for treatment or other patient management decisions. Negative results must be combined with clinical observations, patient history, and epidemiological information. The expected result is Negative. Fact Sheet for Patients: SugarRoll.be Fact Sheet for Healthcare  Providers: https://www.woods-mathews.com/ This test is not yet approved or cleared by the [place] FDA and  has been authorized for detection and/or diagnosis of SARS-CoV-2 by FDA under an Emergency Use Authorization (EUA). This EUA will remain  in effect (meaning this test can be used) for the duration of the COVID-19 declaration under Section 56 4(b)(1) of the Act, 21 U.S.C. section 360bbb-3(b)(1), unless the authorization is terminated or revoked sooner. Performed at [facility], [place] [address] [place],  [address]   Blood Cultures (routine x 2)     Status: None (Preliminary result)   Collection Time: 03/29/19  2:48 PM  Specimen: BLOOD LEFT ARM  Result Value Ref Range Status   Specimen Description BLOOD LEFT ARM  Final   Special Requests   Final    BOTTLES DRAWN AEROBIC AND ANAEROBIC Blood Culture adequate volume   Culture   Final    NO GROWTH 2 DAYS Performed at [facility], [address], [place], [place] [address]    Report Status PENDING  Incomplete  Blood Cultures (routine x 2)     Status: None (Preliminary result)   Collection Time: 03/29/19  2:48 PM   Specimen: BLOOD RIGHT ARM  Result Value Ref Range Status   Specimen Description BLOOD RIGHT ARM  Final   Special Requests   Final    BOTTLES DRAWN AEROBIC AND ANAEROBIC Blood Culture adequate volume   Culture   Final    NO GROWTH 2 DAYS Performed at [facility], [address], [place], [place] [address]    Report Status PENDING  Incomplete  MRSA PCR Screening     Status: None   Collection Time: 03/30/19  6:21 AM   Specimen: Nasopharyngeal  Result Value Ref Range Status   MRSA by PCR NEGATIVE NEGATIVE Final    Comment:        The GeneXpert MRSA Assay (FDA approved for NASAL specimens only), is one component of a comprehensive MRSA colonization surveillance program. It is not intended to diagnose MRSA infection nor to guide or monitor treatment for MRSA infections. Performed at  [facility], [address], [place], [place] [address]          Radiology Studies: Dg Chest 1 View  Result Date: 03/29/2019 CLINICAL DATA:  Altered mental status EXAM: CHEST  1 VIEW COMPARISON:  12/30/2018 FINDINGS: Cardiac shadow is stable. Stimulator pack is again seen over the right chest wall. The lungs are well aerated bilaterally. No focal infiltrate or sizable effusion is seen. No acute bony abnormality is noted. Postsurgical changes in the cervical spine are seen. IMPRESSION: No acute abnormality noted. Electronically Signed   By: Inez Catalina M.D.   On: 03/29/2019 15:44   Ct Head Wo Contrast  Result Date: 03/29/2019 CLINICAL DATA:  Syncopal episode today as well as a fall 2 weeks ago. Right arm and rib pain. Right arm weakness. EXAM: CT HEAD WITHOUT CONTRAST CT CERVICAL SPINE WITHOUT CONTRAST TECHNIQUE: Multidetector CT imaging of the head and cervical spine was performed following the standard protocol without intravenous contrast. Multiplanar CT image reconstructions of the cervical spine were also generated. COMPARISON:  Head CT 08/05/2017. FINDINGS: CT HEAD FINDINGS Brain: Deep brain stimulator leads remain in place extending to the subthalamic regions bilaterally. Within limitations of artifact associated with the leads, no acute infarct, intracranial hemorrhage, mass, midline shift, or extra-axial fluid collection is identified. Hypodensities in the cerebral white matter bilaterally are similar to the prior study and nonspecific but compatible with mild chronic small vessel ischemic disease. The ventricles and sulci are within normal limits for age. Vascular: Calcified atherosclerosis at the skull base. No hyperdense vessel. Skull: No fracture or focal osseous lesion. Sinuses/Orbits: Visualized paranasal sinuses and mastoid air cells are clear. Orbits are unremarkable. Other: None. CT CERVICAL SPINE FINDINGS Alignment: Slight anterolisthesis of C6 on C7, C7 on T1, and T1 on T2. Skull  base and vertebrae: No acute fracture or suspicious osseous lesion. C5-C7 ACDF with solid arthrodesis at C5-6. Lucency through the C6-7 disc space with endplate sclerosis suggesting pseudoarthrosis. Soft tissues and spinal canal: No evidence of prevertebral soft tissue swelling within limitations  of streak artifact. Disc levels: Mild disc space narrowing at C3-4 and C7-T1 with moderate narrowing at C4-5. Moderate multilevel facet arthrosis, right greater than left. Mild-to-moderate right neural foraminal stenosis at C4-5 due to uncovertebral and facet spurring. Upper chest: Mild motion artifact and nonspecific mosaic attenuation in the lung apices. No pneumothorax. Other: None. IMPRESSION: 1. No evidence of acute intracranial abnormality. 2. Mild chronic small vessel ischemic disease. 3. No evidence of acute cervical spine fracture. 4. 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 Electronically Signed   By: Logan Bores M.D.   On: 03/29/2019 16:45   Ct Cervical Spine Wo Contrast  Result Date: 03/29/2019 CLINICAL DATA:  Syncopal episode today as well as a fall 2 weeks ago. Right arm and rib pain. Right arm weakness. EXAM: CT HEAD WITHOUT CONTRAST CT CERVICAL SPINE WITHOUT CONTRAST TECHNIQUE: Multidetector CT imaging of the head and cervical spine was performed following the standard protocol without intravenous contrast. Multiplanar CT image reconstructions of the cervical spine were also generated. COMPARISON:  Head CT 08/05/2017. FINDINGS: CT HEAD FINDINGS Brain: Deep brain stimulator leads remain in place extending to the subthalamic regions bilaterally. Within limitations of artifact associated with the leads, no acute infarct, intracranial hemorrhage, mass, midline shift, or extra-axial fluid collection is identified. Hypodensities in the cerebral white matter bilaterally are similar to the prior study and nonspecific but compatible with mild chronic small vessel ischemic  disease. The ventricles and sulci are within normal limits for age. Vascular: Calcified atherosclerosis at the skull base. No hyperdense vessel. Skull: No fracture or focal osseous lesion. Sinuses/Orbits: Visualized paranasal sinuses and mastoid air cells are clear. Orbits are unremarkable. Other: None. CT CERVICAL SPINE FINDINGS Alignment: Slight anterolisthesis of C6 on C7, C7 on T1, and T1 on T2. Skull base and vertebrae: No acute fracture or suspicious osseous lesion. C5-C7 ACDF with solid arthrodesis at C5-6. Lucency through the C6-7 disc space with endplate sclerosis suggesting pseudoarthrosis. Soft tissues and spinal canal: No evidence of prevertebral soft tissue swelling within limitations of streak artifact. Disc levels: Mild disc space narrowing at C3-4 and C7-T1 with moderate narrowing at C4-5. Moderate multilevel facet arthrosis, right greater than left. Mild-to-moderate right neural foraminal stenosis at C4-5 due to uncovertebral and facet spurring. Upper chest: Mild motion artifact and nonspecific mosaic attenuation in the lung apices. No pneumothorax. Other: None. IMPRESSION: 1. No evidence of acute intracranial abnormality. 2. Mild chronic small vessel ischemic disease. 3. No evidence of acute cervical spine fracture. 4. 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 Electronically Signed   By: Logan Bores M.D.   On: 03/29/2019 16:45   Ct Abdomen Pelvis W Contrast  Result Date: 03/29/2019 CLINICAL DATA:  Right-sided abdominal pain. Syncopal episode. EXAM: CT ABDOMEN AND PELVIS WITH CONTRAST TECHNIQUE: Multidetector CT imaging of the abdomen and pelvis was performed using the standard protocol following bolus administration of intravenous contrast. CONTRAST:  142mL OMNIPAQUE IOHEXOL 300 MG/ML  SOLN COMPARISON:  None. FINDINGS: Lower Chest: No acute findings. Hepatobiliary: No hepatic masses identified. Prior cholecystectomy. No evidence of biliary obstruction.  Pancreas:  No mass or inflammatory changes. Spleen: Within normal limits in size and appearance. Adrenals/Urinary Tract: No masses identified. A 2 mm calculus is seen in the upper pole of the right kidney. No evidence of ureteral calculi or hydronephrosis. Unremarkable unopacified urinary bladder. Stomach/Bowel: Small hiatal hernia. No evidence of obstruction, inflammatory process or abnormal fluid collections. Normal appendix visualized. Large stool  burden noted throughout the colon. Vascular/Lymphatic: No pathologically enlarged lymph nodes. No abdominal aortic aneurysm. Reproductive: Prior hysterectomy noted. Adnexal regions are unremarkable in appearance. Other:  None. Musculoskeletal:  No suspicious bone lesions identified. IMPRESSION: No evidence of appendicitis or other acute findings. Large stool burden noted; recommend clinical correlation for possible constipation. Tiny nonobstructing right renal calculus. Small hiatal hernia. Electronically Signed   By: Marlaine Hind M.D.   On: 03/29/2019 18:08   US Carotid Bilateral  Result Date: 03/30/2019 CLINICAL DATA:  73 year old female with a TIA EXAM: BILATERAL CAROTID DUPLEX ULTRASOUND TECHNIQUE: Pearline Cables scale imaging, color Doppler and duplex ultrasound were performed of bilateral carotid and vertebral arteries in the neck. COMPARISON:  None available FINDINGS: Criteria: Quantification of carotid stenosis is based on velocity parameters that correlate the residual internal carotid diameter with NASCET-based stenosis levels, using the diameter of the distal internal carotid lumen as the denominator for stenosis measurement. The following velocity measurements were obtained: RIGHT ICA:  Systolic 82 cm/sec, Diastolic 20 cm/sec CCA:  91 cm/sec SYSTOLIC ICA/CCA RATIO:  0.9 ECA:  113 cm/sec LEFT ICA:  Systolic 96 cm/sec, Diastolic 13 cm/sec CCA:  [id number] cm/sec SYSTOLIC ICA/CCA RATIO:  0.8 ECA:  134 cm/sec Right Brachial SBP: Not acquired Left Brachial SBP: Not acquired  RIGHT CAROTID ARTERY: No significant calcifications of the right common carotid artery. Intermediate waveform maintained. Heterogeneous and partially calcified plaque at the right carotid bifurcation. No significant lumen shadowing. Low resistance waveform of the right ICA. No significant tortuosity. RIGHT VERTEBRAL ARTERY: Antegrade flow with low resistance waveform. LEFT CAROTID ARTERY: No significant calcifications of the left common carotid artery. Intermediate waveform maintained. Heterogeneous and partially calcified plaque at the left carotid bifurcation without significant lumen shadowing. Low resistance waveform of the left ICA. No significant tortuosity. LEFT VERTEBRAL ARTERY:  Antegrade flow with low resistance waveform. IMPRESSION: Color duplex indicates minimal heterogeneous and calcified plaque, with no hemodynamically significant stenosis by duplex criteria in the extracranial cerebrovascular circulation. Signed, Dulcy Fanny. Dellia Nims, RPVI [facility] Electronically Signed   By: Corrie Mckusick D.O.   On: 03/30/2019 14:50        Scheduled Meds:  Carbidopa-Levodopa ER  1 tablet Oral TID   clopidogrel  75 mg Oral Daily   enoxaparin (LOVENOX) injection  40 mg Subcutaneous QHS   oxybutynin  5 mg Oral BID   pramipexole  1.5 mg Oral QHS   sorbitol, milk of mag, mineral oil, glycerin (SMOG) enema  960 mL Rectal Once   Continuous Infusions:   ceFAZolin (ANCEF) IV 1 g (03/31/19 0945)     LOS: 1 day    Time spent: 30 minutes    Stellar Gensel Darleen Crocker, DO [facility] Pager [phone]  If 7PM-7AM, please contact night-coverage [email or web address] Password [email or web address] 03/31/2019, 3:00 PM

## 2019-03-31 NOTE — Progress Notes (Signed)
Tech attempted to perform EEG. EEG computer having technically difficulties and was unable to perform the test at this time. Will have to return with laptop tomorrow when schedule permits. Notified Neuro. Apologize for this inconvenience.

## 2019-03-31 NOTE — Progress Notes (Signed)
OT Cancellation Note  Patient Details Name: ALEIGHYA FENERTY MRN: [id number] DOB: [birth year]   Cancelled Treatment:    Reason Eval/Treat Not Completed: Other (comment). Pt beginning to eat breakfast on OT arrival. OT assisted pt with repositioning in bed for breakfast. Pt reports [facility] staff assists with all ADLs. Will check back tomorrow and evaluate if pt is still admitted.   Guadelupe Sabin, OTR/L  [phone] 03/31/2019, 7:59 AM

## 2019-03-31 NOTE — Progress Notes (Signed)
Per [facility] DSS case worker Ulice Bold, patient's daughterDawn Leonia Tucker is not allowed to contact patient either by telephone or visits to the hospital. Therefore, Courtney Tucker is not a designated visitor.

## 2019-03-31 NOTE — Progress Notes (Signed)
Orthostatic vitals are as follows     03/31/19 1827  Orthostatic Lying   BP- Lying 127/63  Pulse- Lying 91  Orthostatic Sitting  BP- Sitting 134/69  Pulse- Sitting 92  Orthostatic Standing at 0 minutes  BP- Standing at 0 minutes 125/77  Pulse- Standing at 0 minutes 96  Orthostatic Standing at 3 minutes  BP- Standing at 3 minutes 134/86  Pulse- Standing at 3 minutes 104

## 2019-03-31 NOTE — Progress Notes (Deleted)
EEG completed, results pending. 

## 2019-04-01 ENCOUNTER — Inpatient Hospital Stay (HOSPITAL_COMMUNITY)
Admit: 2019-04-01 | Discharge: 2019-04-01 | Disposition: A | Discharge status: Home / Self Care | Payer: Medicare Other | Attending: Neurology | Admitting: Neurology

## 2019-04-01 LAB — CREATININE, SERUM
Creatinine, Ser: 0.85 mg/dL (ref 0.44–1.00)
GFR calc Af Amer: >60 mL/min (ref >60)
GFR calc non Af Amer: >60 mL/min (ref >60)

## 2019-04-01 NOTE — Care Management Important Message (Signed)
Important Message  Patient Details  Name: Courtney Tucker MRN: [id number] Date of Birth: Jul 08, 1945   Medicare Important Message Given:  Yes     [name] 04/01/2019, 3:21 PM

## 2019-04-01 NOTE — Progress Notes (Signed)
PROGRESS NOTE    Courtney Tucker  [id number] DOB: 11-Mar-1946 DOA: 03/29/2019 PCP: Scotty Court, DO   Brief Narrative:  Per HPI: Courtney Tucker an 73 y.o.femalewith past medical history significant for advanced Parkinson's disease with deep brain stimulator, dysphagia, severe dysarthria and pseudobulbar affect, hypertension,GERD and previous TIA who was evaluated in the ED for complaints of syncope, somnolence abdominal pain and right arm pain. Extensive work-up was essentially normal except for marked stool volume which was likely cause of abdominal pain. As patient was getting ready to be discharged however patient noted that in addition to right arm pain she had had an episode where she could not use her right upper extremity. This led to referral to [facility] for admission for TIA work-up.  History as per patient however she has very difficult to understand speech so history is somewhat limited in detail.  Patient tells me that she has been very sleepy since this morning. She is not sure if this has anything to do with her medications. She notes that before breakfast she had an episode where she was completely unable to move her right arm. She demonstrates that she had to lift up her right arm with her left hand in order to move it. She also notes that she was unable to move her right fingers. She was fed breakfast by the nursing staff at her SNF. She also believes she had an episode of syncope while she was sitting in her chair. She did not fall out of her chair. She does not know how long that lasted. She denies any injury. Patient is not sure when her right upper extremity functioning returned however notes that by the time she came here she was able to use her right arm. She did have some pain in her right arm which she had initially discussed with the ED provider but notes the right arm pain is much improved.  Patient sister who is at bedside  is concerned about an area of warmth and redness in her left lower extremity around a abrasion she sustained during a fall last week.   Patient herself denies any fevers or chills. She denies feeling confused. She does think she is much more awake now than when she came in earlier. She does not remember the syncope episode and is not quite sure how she knows that she had syncope. Patient denies headache, nausea or vomiting. Patient denies any diarrhea in fact she is quite constipated. No cough, no shortness of breath, no chest pain or palpitations.  11/4:Patient was admitted for possible TIA in the setting of advanced Parkinson's disease. She is also noted to have left lower extremity cellulitis and has been started on IV vancomycin. She is also noted to have marked somnolence versus syncope. 2D echocardiogram performed with LVEF 60-65% and grade 1 diastolic dysfunction. Carotid ultrasound ordered and pending. MRI cannot be obtained due to deep brain stimulator placement. Neurology consultation pending.  11/5: [facility] neurology evaluation on 11/4 with plans for EEG which are still pending at the moment.  Orthostatic vitals within normal limits otherwise.  Plan for discharge back to [facility] once EEG tests have returned hopefully in a.m.  11/6: EEG performed today with results still pending.  She remains on Keflex with improvement in her cellulitis noted.   Assessment & Plan:   Principal Problem:   TIA (transient ischemic attack) Active Problems:   Essential hypertension   Pseudobulbar affect   Depression, major  Parkinson's disease ([place])   S/P deep brain stimulator placement   Syncope   Possible TIA in the setting of advanced parkinsonism -CT head with no acute findings, cannot perform brain MRI due to deep brain stimulator -2D echocardiogram with LVEF 60 to 65% with grade 1 diastolic dysfunction noted -Bilateral carotid ultrasound ordered and  pending -Continue Plavix -Lipid panel with LDL 108 and A1c 5.6% -PT/OT evaluation ordered -Neurology consultation appreciated with EEG performed and pending  Left lower extremity cellulitis-improving -Appears to be mild, blood cultures with no growth in 2 days and MRSA PCR negative -Continue on Keflex for course of treatment.  Somnolence versus syncope -We will plan to reevaluate and avoid sedating agents -Monitor on telemetry -2D echocardiogram without acute findings -Bilateral carotid ultrasound without any acute findings  Heavy stool burden/constipation -Smog enema as needed -Patient is having BM last on 11/3  Hypertension -Currently with soft blood pressure readings -Resume home amlodipine and losartan  Depression -Resume duloxetine and trazodone  Parkinson's disease with DBS -Continue Sinemet and Mirapex -Appreciate neurology evaluation  DVT prophylaxis:Lovenox Code Status:Full Family Communication:None at bedside Disposition Plan:Plan for DC back to [facility] by 11/9.  EEG has been performed with results pending.  She will not be able to go to ALF due to her new medication of Keflex at this time according to CSW.   Consultants:  Neurology  Procedures:  2D echocardiogram 11/4  EEG 11/6 with results pending  Antimicrobials:  Anti-infectives (From admission, onward)   Start     Dose/Rate Route Frequency Ordered Stop   03/31/19 1800  cephALEXin (KEFLEX) capsule 250 mg     250 mg Oral Every 6 hours 03/31/19 1503     03/30/19 2200  vancomycin (VANCOCIN) IVPB 750 mg/150 ml premix  Status:  Discontinued     750 mg 150 mL/hr over 60 Minutes Intravenous Every 24 hours 03/29/19 2115 03/30/19 1303   03/30/19 1800  ceFAZolin (ANCEF) IVPB 1 g/50 mL premix  Status:  Discontinued     1 g 100 mL/hr over 30 Minutes Intravenous Every 8 hours 03/30/19 1303 03/31/19 1503   03/29/19 2200  vancomycin (VANCOCIN) 1,250 mg in sodium chloride 0.9 % 250 mL  IVPB     1,250 mg 166.7 mL/hr over 90 Minutes Intravenous  Once 03/29/19 2115 03/30/19 0119       Subjective: Patient seen and evaluated today with no new acute complaints or concerns. No acute concerns or events noted overnight.  Objective: Vitals:   03/31/19 0811 03/31/19 2222 04/01/19 0540 04/01/19 0744  BP:  130/62 (!) 127/53   Pulse:  85 68   Resp:  20 20   Temp:  (!) 97.5 F (36.4 C) 98.2 F (36.8 C)   TempSrc:  Oral Oral   SpO2: 98% 94% 95% 96%  Weight:      Height:       No intake or output data in the 24 hours ending 04/01/19 1618 Filed Weights   03/29/19 1420  Weight: 67.1 kg    Examination:  General exam: Appears calm and comfortable  Respiratory system: Clear to auscultation. Respiratory effort normal. Cardiovascular system: S1 & S2 heard, RRR. No JVD, murmurs, rubs, gallops or clicks. No pedal edema. Gastrointestinal system: Abdomen is nondistended, soft and nontender. No organomegaly or masses felt. Normal bowel sounds heard. Central nervous system: Alert and awake Extremities: Symmetric 5 x 5 power. Skin: No rashes, lesions or ulcers Psychiatry: Mildly depressed mood/flat affect    Data Reviewed: I  have personally reviewed following labs and imaging studies  CBC: Recent Labs  Lab 03/29/19 1448  WBC 6.1  NEUTROABS 3.1  HGB 14.1  HCT 42.7  MCV 96.6  PLT 0000000   Basic Metabolic Panel: Recent Labs  Lab 03/29/19 1448 03/30/19 0413 04/01/19 0452  NA 138 139  --   K 4.0 4.0  --   CL 103 104  --   CO2 27 25  --   GLUCOSE 103* 100*  --   BUN 26* 21  --   CREATININE 0.97 0.80 0.85  CALCIUM 9.4 9.0  --    GFR: Estimated Creatinine Clearance: 52.9 mL/min (by C-G formula based on SCr of 0.85 mg/dL). Liver Function Tests: Recent Labs  Lab 03/29/19 1448  AST 25  ALT 45*  ALKPHOS 161*  BILITOT 0.3  PROT 7.5  ALBUMIN 4.2   No results for input(s): LIPASE, AMYLASE in the last 168 hours. Recent Labs  Lab 03/29/19 1448  AMMONIA 15    Coagulation Profile: No results for input(s): INR, PROTIME in the last 168 hours. Cardiac Enzymes: No results for input(s): CKTOTAL, CKMB, CKMBINDEX, TROPONINI in the last 168 hours. BNP (last 3 results) No results for input(s): PROBNP in the last 8760 hours. HbA1C: Recent Labs    03/30/19 1246  HGBA1C 5.6   CBG: Recent Labs  Lab 03/29/19 1515  GLUCAP 87   Lipid Profile: Recent Labs    03/30/19 1246  CHOL 178  HDL 45  LDLCALC 108*  TRIG 127  CHOLHDL 4.0   Thyroid Function Tests: No results for input(s): TSH, T4TOTAL, FREET4, T3FREE, THYROIDAB in the last 72 hours. Anemia Panel: No results for input(s): VITAMINB12, FOLATE, FERRITIN, TIBC, IRON, RETICCTPCT in the last 72 hours. Sepsis Labs: Recent Labs  Lab 03/29/19 1448  LATICACIDVEN 1.3    Recent Results (from the past 240 hour(s))  Urine culture     Status: Abnormal   Collection Time: 03/29/19  2:35 PM   Specimen: Urine, Catheterized  Result Value Ref Range Status   Specimen Description   Final    URINE, CATHETERIZED Performed at [facility], [address] [place], [place] [address]    Special Requests   Final    NONE Performed at [facility], [address] [place], [place] [address]    Culture MULTIPLE SPECIES PRESENT, SUGGEST RECOLLECTION (A)  Final   Report Status 03/30/2019 FINAL  Final  SARS CORONAVIRUS 2 (TAT 6-24 HRS) Nasopharyngeal Nasopharyngeal Swab     Status: None   Collection Time: 03/29/19  2:40 PM   Specimen: Nasopharyngeal Swab  Result Value Ref Range Status   SARS Coronavirus 2 NEGATIVE NEGATIVE Final    Comment: (NOTE) SARS-CoV-2 target nucleic acids are NOT DETECTED. The SARS-CoV-2 RNA is generally detectable in upper and lower respiratory specimens during the acute phase of infection. Negative results do not preclude SARS-CoV-2 infection, do not rule out co-infections with other pathogens, and should not be used as the sole basis for treatment or other patient management  decisions. Negative results must be combined with clinical observations, patient history, and epidemiological information. The expected result is Negative. Fact Sheet for Patients: SugarRoll.be Fact Sheet for Healthcare Providers: https://www.woods-mathews.com/ This test is not yet approved or cleared by the [place] FDA and  has been authorized for detection and/or diagnosis of SARS-CoV-2 by FDA under an Emergency Use Authorization (EUA). This EUA will remain  in effect (meaning this test can be used) for the duration of the COVID-19 declaration  under Section 56 4(b)(1) of the Act, 21 U.S.C. section 360bbb-3(b)(1), unless the authorization is terminated or revoked sooner. Performed at [facility], [place] [address], [place], [place] [address]   Blood Cultures (routine x 2)     Status: None (Preliminary result)   Collection Time: 03/29/19  2:48 PM   Specimen: BLOOD LEFT ARM  Result Value Ref Range Status   Specimen Description BLOOD LEFT ARM  Final   Special Requests   Final    BOTTLES DRAWN AEROBIC AND ANAEROBIC Blood Culture adequate volume   Culture   Final    NO GROWTH 3 DAYS Performed at [facility], [address], [place], [place] [address]    Report Status PENDING  Incomplete  Blood Cultures (routine x 2)     Status: None (Preliminary result)   Collection Time: 03/29/19  2:48 PM   Specimen: BLOOD RIGHT ARM  Result Value Ref Range Status   Specimen Description BLOOD RIGHT ARM  Final   Special Requests   Final    BOTTLES DRAWN AEROBIC AND ANAEROBIC Blood Culture adequate volume   Culture   Final    NO GROWTH 3 DAYS Performed at [facility], [address] [place], [place] [address]    Report Status PENDING  Incomplete  MRSA PCR Screening     Status: None   Collection Time: 03/30/19  6:21 AM   Specimen: Nasopharyngeal  Result Value Ref Range Status   MRSA by PCR NEGATIVE NEGATIVE Final    Comment:        The  GeneXpert MRSA Assay (FDA approved for NASAL specimens only), is one component of a comprehensive MRSA colonization surveillance program. It is not intended to diagnose MRSA infection nor to guide or monitor treatment for MRSA infections. Performed at [facility], [address], [place], [place] [address]          Radiology Studies: No results found.      Scheduled Meds: . amLODipine  10 mg Oral Daily  . Carbidopa-Levodopa ER  1 tablet Oral TID  . cephALEXin  250 mg Oral Q6H  . clopidogrel  75 mg Oral Daily  . DULoxetine  60 mg Oral BID  . enoxaparin (LOVENOX) injection  40 mg Subcutaneous QHS  . losartan  100 mg Oral Daily  . oxybutynin  5 mg Oral BID  . pramipexole  1.5 mg Oral QHS  . sorbitol, milk of mag, mineral oil, glycerin (SMOG) enema  960 mL Rectal Once  . traZODone  100 mg Oral QHS   Continuous Infusions:   LOS: 2 days    Time spent: 30 minutes     Darleen Crocker, DO [facility] Pager [phone]  If 7PM-7AM, please contact night-coverage [email or web address] Password [email or web address] 04/01/2019, 4:18 PM

## 2019-04-01 NOTE — TOC Progression Note (Signed)
Transition of Care [facility]) - Progression Note    Patient Details  Name: Courtney Tucker MRN: [id number] Date of Birth: December 03, 1945  [facility]) CM/SW Contact  Shade Flood, LCSW Phone Number: 04/01/2019, 4:12 PM  Clinical Narrative:     EEG complete but not read yet. Pt unable to dc back to [facility] today. Spoke with Baker Janus at [facility] to update. Per Baker Janus, they can accept pt back over the weekend IF she has NO new medications and if they receive the FL2 and dc summary prior to dc. Pt will need to transfer via EMS.  If pt does have new medications at dc, she will have to wait until Monday. MD aware.  Voicemail message left with pt's DSS guardian, Baxter Flattery, to update.  Weekend TOC can assist if needed. Will follow up Monday if pt remains hospitalized at that time.  Expected Discharge Plan: Assisted Living Barriers to Discharge: Continued Medical Work up  Expected Discharge Plan and Services Expected Discharge Plan: Assisted Living In-house Referral: Clinical Social Work     Living arrangements for the past 2 months: [place], [place]                                       Social Determinants of Health (SDOH) Interventions    Readmission Risk Interventions Readmission Risk Prevention Plan 03/30/2019  Medication Screening Complete  Transportation Screening Complete  Some recent data might be hidden

## 2019-04-01 NOTE — Progress Notes (Signed)
Physical Therapy Treatment Patient Details Name: Courtney Tucker MRN: [id number] DOB: 03-28-1946 Today's Date: 04/01/2019    History of Present Illness Courtney Tucker is an 73 y.o. 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  Extensive work-up was essentially normal except for marked stool volume which was likely cause of abdominal pain.  As patient was getting ready to be discharged however patient noted that in addition to right arm pain she had had an episode where she could not use her right upper extremity.  This led to referral to [facility] for admission for TIA work-up.    PT Comments    Patient presents supine in bed. Patient requires Mod I for transfers, but shows some improved return with appropriate weight shifting. Patient does require Mod verbal and tactile cueing for hand and foot placement with supine to sit, as well as sit to stand transfer. Patient was able to progress to 3 small steps forward from bed and return, using RW and Max A. Patient required frequent verbal and tactile cues for weight shifting to advance BLEs. Patient showed improved tolerance to standing with RW, and was able to stand about 3 minutes before needing rest due to fatigue. Patient able to perform 3-4 small sidesteps from bed to chair using RW and Max A, again with frequent cueing for appropriate weight shifting and for maintaining weight through [place] with RW for increased stability. Patient left upright in chair. Patient will benefit from continued physical therapy in hospital and recommended venue below to increase strength, balance, endurance for safe ADLs and gait.    Follow Up Recommendations  SNF;Supervision/Assistance - 24 hour;Supervision  for mobility/OOB     Equipment Recommendations       Recommendations for Other Services       Precautions / Restrictions Precautions Precautions: Fall Restrictions Weight Bearing Restrictions: No    Mobility  Bed Mobility Overal bed mobility: Needs Assistance Bed Mobility: Supine to Sit     Supine to sit: Mod assist     General bed mobility comments: Improved weight shifting, required verbal and tacile cues for swinging legs to EOB  Transfers Overall transfer level: Needs assistance Equipment used: 1 person hand held assist Transfers: Sit to/from Stand Sit to Stand: Mod assist         General transfer comment: improved weight shifting, requires continued cues for hand placement and to push up from bed using BUEs with sit to stand  Ambulation/Gait Ambulation/Gait assistance: Max assist Gait Distance (Feet): 5 Feet Assistive device: 1 person hand held assist;Rolling walker (2 wheeled) Gait Pattern/deviations: Shuffle;Decreased step length - right;Decreased step length - left Gait velocity: Slow   General Gait Details: Patient was able to take 3 small steps forward form EOB using RW, and return with Max A verbal and tacile cueing for appropriate weight shifting to advance BLE. Patient was then able to sidestep from bed to chair in similar manner/ level of assist.   Stairs             Wheelchair Mobility    Modified Rankin (Stroke Patients Only)       Balance Overall balance assessment: Needs assistance Sitting-balance support: Feet supported;No upper extremity supported Sitting balance-Leahy Scale: Fair Sitting balance - Comments: Seated at EOB   Standing balance support: Bilateral  upper extremity supported Standing balance-Leahy Scale: Poor Standing balance comment: Standing with RW                            Cognition Arousal/Alertness: Awake/alert Behavior During Therapy: WFL for tasks assessed/performed Overall Cognitive Status:  Within Functional Limits for tasks assessed                                        Exercises General Exercises - Lower Extremity Long Arc Quad: AROM;Strengthening;Seated;15 reps;Both Hip Flexion/Marching: AROM;Strengthening;Seated;15 reps;Both Toe Raises: AROM;Strengthening;Both;15 reps;Seated Heel Raises: AROM;Strengthening;Both;15 reps;Seated    General Comments        Pertinent Vitals/Pain Pain Assessment: No/denies pain    Home Living                      Prior Function            PT Goals (current goals can now be found in the care plan section) Acute Rehab PT Goals Patient Stated Goal: Return to assisted living facility PT Goal Formulation: With patient Time For Goal Achievement: 04/13/19 Potential to Achieve Goals: Good Progress towards PT goals: Progressing toward goals    Frequency    Min 3X/week      PT Plan Current plan remains appropriate    Co-evaluation              AM-PAC PT "6 Clicks" Mobility   Outcome Measure  Help needed turning from your back to your side while in a flat bed without using bedrails?: A Lot Help needed moving from lying on your back to sitting on the side of a flat bed without using bedrails?: A Lot Help needed moving to and from a bed to a chair (including a wheelchair)?: A Lot Help needed standing up from a chair using your arms (e.g., wheelchair or bedside chair)?: A Lot Help needed to walk in hospital room?: A Lot Help needed climbing 3-5 steps with a railing? : Total 6 Click Score: 11    End of Session Equipment Utilized During Treatment: Gait belt Activity Tolerance: Patient tolerated treatment well;Patient limited by fatigue Patient left: in chair;with call bell/phone within reach Nurse Communication: Mobility status PT Visit Diagnosis: Unsteadiness on feet (R26.81);Muscle weakness (generalized) (M62.81);Other abnormalities of gait and mobility (R26.89);Difficulty in walking, not  elsewhere classified (R26.2)     Time: [id number] PT Time Calculation (min) (ACUTE ONLY): 30 min  Charges:  $Therapeutic Exercise: 8-22 mins $Therapeutic Activity: 8-22 mins                     11:17 AM, 04/01/19 Josue Hector PT DPT  Physical Therapist with [facility]  [phone]

## 2019-04-01 NOTE — Progress Notes (Signed)
EEG complete - results pending 

## 2019-04-02 NOTE — Procedures (Signed)
  [place] A. Merlene Laughter, MD     [email or web address]           HISTORY: The patient is a 73 year old who presents with episodes of pain, numbness and weakness involving the right upper extremity worrisome for partial seizures.  MEDICATIONS:  Current Facility-Administered Medications:  .  acetaminophen (TYLENOL) tablet 500 mg, 500 mg, Oral, Q6H PRN, Vashti Hey, MD, 500 mg at 03/31/19 0951 .  amLODipine (NORVASC) tablet 10 mg, 10 mg, Oral, Daily, Manuella Ghazi, Pratik D, DO, 10 mg at 04/02/19 1004 .  Carbidopa-Levodopa ER (SINEMET CR) 25-100 MG tablet controlled release 1 tablet, 1 tablet, Oral, TID, Jamse Arn Kyra Searles, MD, 1 tablet at 04/02/19 1004 .  cephALEXin (KEFLEX) capsule 250 mg, 250 mg, Oral, Q6H, Shah, Pratik D, DO, 250 mg at 04/02/19 1104 .  clopidogrel (PLAVIX) tablet 75 mg, 75 mg, Oral, Daily, Bonnell Public Tublu, MD, 75 mg at 04/02/19 1004 .  DULoxetine (CYMBALTA) DR capsule 60 mg, 60 mg, Oral, BID, Manuella Ghazi, Pratik D, DO, 60 mg at 04/02/19 1004 .  enoxaparin (LOVENOX) injection 40 mg, 40 mg, Subcutaneous, QHS, Bonnell Public Tublu, MD, 40 mg at 04/01/19 2149 .  losartan (COZAAR) tablet 100 mg, 100 mg, Oral, Daily, Manuella Ghazi, Pratik D, DO, 100 mg at 04/02/19 1004 .  oxybutynin (DITROPAN) tablet 5 mg, 5 mg, Oral, BID, Bonnell Public Tublu, MD, 5 mg at 04/02/19 1004 .  pramipexole (MIRAPEX) tablet 1.5 mg, 1.5 mg, Oral, QHS, Bonnell Public Tublu, MD, 1.5 mg at 04/01/19 2149 .  sorbitol, milk of mag, mineral oil, glycerin (SMOG) enema, 960 mL, Rectal, Once, Manuella Ghazi, Pratik D, DO .  traZODone (DESYREL) tablet 100 mg, 100 mg, Oral, QHS, Shah, Pratik D, DO, 100 mg at 04/01/19 2149     ANALYSIS: A 16 channel recording using standard 10 20 measurements is conducted for 26 minutes.  There is a well-formed posterior dominant rhythm of 9.5-9 Hz which attenuates with eye opening.  There is beta activity observed in the frontal areas.  Awake and  drowsy architecture are observed.  There is episodes of theta slowing occurring synchronously or independently evolving the temporal regions often associated with sharp contoured waves.  Photic stimulation and hyperventilation are not conducted.   IMPRESSION: 1.  This recording shows episodes of bitemporal slowing with sharp wave activity.  The clinical significance is uncertain given the patient's focal symptoms.        Kofi A. Merlene Laughter, M.D.  Diplomate, Tax adviser of Psychiatry and Neurology ( Neurology).

## 2019-04-02 NOTE — Progress Notes (Signed)
PROGRESS NOTE    Courtney Tucker  [id number] DOB: 12-Jan-1946 DOA: 03/29/2019 PCP: Scotty Court, DO   Brief Narrative:  Per HPI: Eloda Cedotal McDanielis an 73 y.o.femalewith past medical history significant for advanced Parkinson's disease with deep brain stimulator, dysphagia, severe dysarthria and pseudobulbar affect, hypertension,GERD and previous TIA who was evaluated in the ED for complaints of syncope, somnolence abdominal pain and right arm pain. Extensive work-up was essentially normal except for marked stool volume which was likely cause of abdominal pain. As patient was getting ready to be discharged however patient noted that in addition to right arm pain she had had an episode where she could not use her right upper extremity. This led to referral to [facility] for admission for TIA work-up.  History as per patient however she has very difficult to understand speech so history is somewhat limited in detail.  Patient tells me that she has been very sleepy since this morning. She is not sure if this has anything to do with her medications. She notes that before breakfast she had an episode where she was completely unable to move her right arm. She demonstrates that she had to lift up her right arm with her left hand in order to move it. She also notes that she was unable to move her right fingers. She was fed breakfast by the nursing staff at her SNF. She also believes she had an episode of syncope while she was sitting in her chair. She did not fall out of her chair. She does not know how long that lasted. She denies any injury. Patient is not sure when her right upper extremity functioning returned however notes that by the time she came here she was able to use her right arm. She did have some pain in her right arm which she had initially discussed with the ED provider but notes the right arm pain is much improved.  Patient sister who is at bedside  is concerned about an area of warmth and redness in her left lower extremity around a abrasion she sustained during a fall last week.   Patient herself denies any fevers or chills. She denies feeling confused. She does think she is much more awake now than when she came in earlier. She does not remember the syncope episode and is not quite sure how she knows that she had syncope. Patient denies headache, nausea or vomiting. Patient denies any diarrhea in fact she is quite constipated. No cough, no shortness of breath, no chest pain or palpitations.  11/4:Patient was admitted for possible TIA in the setting of advanced Parkinson's disease. She is also noted to have left lower extremity cellulitis and has been started on IV vancomycin. She is also noted to have marked somnolence versus syncope. 2D echocardiogram performed with LVEF 60-65% and grade 1 diastolic dysfunction. Carotid ultrasound ordered and pending. MRI cannot be obtained due to deep brain stimulator placement. Neurology consultation pending.  11/5:Appreciate neurology evaluation on 11/4 with plans for EEG which are still pending at the moment. Orthostatic vitals within normal limits otherwise. Plan for [facility] to [facility] once EEG tests have returned hopefully in a.m.  11/6: EEG performed today with results still pending.  She remains on Keflex with improvement in her cellulitis noted.  11/7: EEG results are still pending.  She remains on Keflex and requires 2 more days of treatment to complete 7 days.  Unfortunately, she cannot go back to her assisted  living facility with his new medication until Monday 11/9.  Assessment & Plan:   Principal Problem:   TIA (transient ischemic attack) Active Problems:   Essential hypertension   Pseudobulbar affect   Depression, major   Parkinson's disease (HCC)   S/P deep brain stimulator placement   Syncope   Possible TIA in the setting of  advanced parkinsonism -CT head with no acute findings, cannot perform brain MRI due to deep brain stimulator -2D echocardiogram with LVEF 60 to 65% with grade 1 diastolic dysfunction noted -Bilateral carotid ultrasound ordered and pending -Continue Plavix -Lipid panel with LDL 108 and A1c 5.6% -PT/OT evaluation ordered -Neurology consultationappreciated with EEG performed on 11/6 with results pending  Left lower extremity cellulitis-improving -Appears to be mild,blood cultures with no growth and MRSA PCR negative -Continue on Keflex for course of treatment for 2 more days  Somnolence versus syncope -We will plan to reevaluate and avoid sedating agents -Monitor on telemetry -2D echocardiogram without acute findings -Bilateral carotid ultrasoundwithout any acute findings  Heavy stool burden/constipation -Smog enemaas needed -Patient is having BM last on 11/3  Hypertension -Currently with soft blood pressure readings -Resume home amlodipine and losartan  Depression -Resume duloxetine and trazodone  Parkinson's disease with DBS -Continue Sinemet and Mirapex -Appreciate neurology evaluation  DVT prophylaxis:Lovenox Code Status:Full Family Communication:None at bedside Disposition Plan:Plan for [facility] back to [facility] by 11/9.  EEG has been performed with results pending.  She will not be able to go to ALF due to her new medication of Keflex at this time according to CSW.   Consultants:  Neurology  Procedures:  2D echocardiogram 11/4  EEG 11/6 with results pending  Antimicrobials:  Anti-infectives (From admission, onward)   Start     Dose/Rate Route Frequency Ordered Stop   03/31/19 1800  cephALEXin (KEFLEX) capsule 250 mg     250 mg Oral Every 6 hours 03/31/19 1503     03/30/19 2200  vancomycin (VANCOCIN) IVPB 750 mg/150 ml premix  Status:  Discontinued     750 mg 150 mL/hr over 60 Minutes Intravenous Every 24 hours 03/29/19 2115 03/30/19 1303    03/30/19 1800  ceFAZolin (ANCEF) IVPB 1 g/50 mL premix  Status:  Discontinued     1 g 100 mL/hr over 30 Minutes Intravenous Every 8 hours 03/30/19 1303 03/31/19 1503   03/29/19 2200  vancomycin (VANCOCIN) 1,250 mg in sodium chloride 0.9 % 250 mL IVPB     1,250 mg 166.7 mL/hr over 90 Minutes Intravenous  Once 03/29/19 2115 03/30/19 0119       Subjective: Patient seen and evaluated today with no new acute complaints or concerns. No acute concerns or events noted overnight.  Her cellulitis has improved considerably.  Objective: Vitals:   04/01/19 0540 04/01/19 0744 04/01/19 2100 04/02/19 0548  BP: (!) 127/53  (!) 146/54 (!) 95/57  Pulse: 68  63 71  Resp: 20  20 17   Temp: 98.2 F (36.8 C)  98.6 F (37 C) 98.5 F (36.9 C)  TempSrc: Oral  Oral   SpO2: 95% 96% 96% 96%  Weight:      Height:        Intake/Output Summary (Last 24 hours) at 04/02/2019 1113 Last data filed at 04/02/2019 0900 Gross per 24 hour  Intake 480 ml  Output 600 ml  Net -120 ml   Filed Weights   03/29/19 1420  Weight: 67.1 kg    Examination:  General exam: Appears calm and comfortable  Respiratory  system: Clear to auscultation. Respiratory effort normal. Cardiovascular system: S1 & S2 heard, RRR. No JVD, murmurs, rubs, gallops or clicks. No pedal edema. Gastrointestinal system: Abdomen is nondistended, soft and nontender. No organomegaly or masses felt. Normal bowel sounds heard. Central nervous system: Alert and oriented. No focal neurological deficits. Extremities: Symmetric 5 x 5 power. Skin: No rashes, lesions or ulcers, left lower extremity erythema much improved. Psychiatry: Judgement and insight appear normal. Mood & affect appropriate.     Data Reviewed: I have personally reviewed following labs and imaging studies  CBC: Recent Labs  Lab 03/29/19 1448  WBC 6.1  NEUTROABS 3.1  HGB 14.1  HCT 42.7  MCV 96.6  PLT 0000000   Basic Metabolic Panel: Recent Labs  Lab 03/29/19 1448 03/30/19  0413 04/01/19 0452  NA 138 139  --   K 4.0 4.0  --   CL 103 104  --   CO2 27 25  --   GLUCOSE 103* 100*  --   BUN 26* 21  --   CREATININE 0.97 0.80 0.85  CALCIUM 9.4 9.0  --    GFR: Estimated Creatinine Clearance: 52.9 mL/min (by C-G formula based on SCr of 0.85 mg/dL). Liver Function Tests: Recent Labs  Lab 03/29/19 1448  AST 25  ALT 45*  ALKPHOS 161*  BILITOT 0.3  PROT 7.5  ALBUMIN 4.2   No results for input(s): LIPASE, AMYLASE in the last 168 hours. Recent Labs  Lab 03/29/19 1448  AMMONIA 15   Coagulation Profile: No results for input(s): INR, PROTIME in the last 168 hours. Cardiac Enzymes: No results for input(s): CKTOTAL, CKMB, CKMBINDEX, TROPONINI in the last 168 hours. BNP (last 3 results) No results for input(s): PROBNP in the last 8760 hours. HbA1C: Recent Labs    03/30/19 1246  HGBA1C 5.6   CBG: Recent Labs  Lab 03/29/19 1515  GLUCAP 87   Lipid Profile: Recent Labs    03/30/19 1246  CHOL 178  HDL 45  LDLCALC 108*  TRIG 127  CHOLHDL 4.0   Thyroid Function Tests: No results for input(s): TSH, T4TOTAL, FREET4, T3FREE, THYROIDAB in the last 72 hours. Anemia Panel: No results for input(s): VITAMINB12, FOLATE, FERRITIN, TIBC, IRON, RETICCTPCT in the last 72 hours. Sepsis Labs: Recent Labs  Lab 03/29/19 1448  LATICACIDVEN 1.3    Recent Results (from the past 240 hour(s))  Urine culture     Status: Abnormal   Collection Time: 03/29/19  2:35 PM   Specimen: Urine, Catheterized  Result Value Ref Range Status   Specimen Description   Final    URINE, CATHETERIZED Performed at [facility], [address], [place], [place] [address]    Special Requests   Final    NONE Performed at [facility], [address], [place], [place] [address]    Culture MULTIPLE SPECIES PRESENT, SUGGEST RECOLLECTION (A)  Final   Report Status 03/30/2019 FINAL  Final  SARS CORONAVIRUS 2 (TAT 6-24 HRS) Nasopharyngeal Nasopharyngeal Swab     Status: None    Collection Time: 03/29/19  2:40 PM   Specimen: Nasopharyngeal Swab  Result Value Ref Range Status   SARS Coronavirus 2 NEGATIVE NEGATIVE Final    Comment: (NOTE) SARS-CoV-2 target nucleic acids are NOT DETECTED. The SARS-CoV-2 RNA is generally detectable in upper and lower respiratory specimens during the acute phase of infection. Negative results do not preclude SARS-CoV-2 infection, do not rule out co-infections with other pathogens, and should not be used as the sole basis for treatment  or other patient management decisions. Negative results must be combined with clinical observations, patient history, and epidemiological information. The expected result is Negative. Fact Sheet for Patients: SugarRoll.be Fact Sheet for Healthcare Providers: https://www.woods-mathews.com/ This test is not yet approved or cleared by the [place] FDA and  has been authorized for detection and/or diagnosis of SARS-CoV-2 by FDA under an Emergency Use Authorization (EUA). This EUA will remain  in effect (meaning this test can be used) for the duration of the COVID-19 declaration under Section 56 4(b)(1) of the Act, 21 U.S.C. section 360bbb-3(b)(1), unless the authorization is terminated or revoked sooner. Performed at [facility], [place] [address] [place], [place] [address]   Blood Cultures (routine x 2)     Status: None (Preliminary result)   Collection Time: 03/29/19  2:48 PM   Specimen: BLOOD LEFT ARM  Result Value Ref Range Status   Specimen Description BLOOD LEFT ARM  Final   Special Requests   Final    BOTTLES DRAWN AEROBIC AND ANAEROBIC Blood Culture adequate volume   Culture   Final    NO GROWTH 4 DAYS Performed at [facility], [address], [place], [place] [address]    Report Status PENDING  Incomplete  Blood Cultures (routine x 2)     Status: None (Preliminary result)   Collection Time: 03/29/19  2:48 PM   Specimen: BLOOD RIGHT  ARM  Result Value Ref Range Status   Specimen Description BLOOD RIGHT ARM  Final   Special Requests   Final    BOTTLES DRAWN AEROBIC AND ANAEROBIC Blood Culture adequate volume   Culture   Final    NO GROWTH 4 DAYS Performed at [facility], [address], [place], [place] [address]    Report Status PENDING  Incomplete  MRSA PCR Screening     Status: None   Collection Time: 03/30/19  6:21 AM   Specimen: Nasopharyngeal  Result Value Ref Range Status   MRSA by PCR NEGATIVE NEGATIVE Final    Comment:        The GeneXpert MRSA Assay (FDA approved for NASAL specimens only), is one component of a comprehensive MRSA colonization surveillance program. It is not intended to diagnose MRSA infection nor to guide or monitor treatment for MRSA infections. Performed at [facility], [address] [place], [place] [address]          Radiology Studies: No results found.      Scheduled Meds: . amLODipine  10 mg Oral Daily  . Carbidopa-Levodopa ER  1 tablet Oral TID  . cephALEXin  250 mg Oral Q6H  . clopidogrel  75 mg Oral Daily  . DULoxetine  60 mg Oral BID  . enoxaparin (LOVENOX) injection  40 mg Subcutaneous QHS  . losartan  100 mg Oral Daily  . oxybutynin  5 mg Oral BID  . pramipexole  1.5 mg Oral QHS  . sorbitol, milk of mag, mineral oil, glycerin (SMOG) enema  960 mL Rectal Once  . traZODone  100 mg Oral QHS   Continuous Infusions:   LOS: 3 days    Time spent: 30 minutes     Darleen Crocker, DO [facility] Pager [phone]  If 7PM-7AM, please contact night-coverage [email or web address] Password [email or web address] 04/02/2019, 11:13 AM

## 2019-04-03 NOTE — Progress Notes (Signed)
PROGRESS NOTE    Courtney Tucker  [id number] DOB: 05-30-1945 DOA: 03/29/2019 PCP: Scotty Court, DO   Brief Narrative:  Per HPI: Courtney Hagelstein McDanielis an 73 y.o.femalewith past medical history significant for advanced Parkinson's disease with deep brain stimulator, dysphagia, severe dysarthria and pseudobulbar affect, hypertension,GERD and previous TIA who was evaluated in the ED for complaints of syncope, somnolence abdominal pain and right arm pain. Extensive work-up was essentially normal except for marked stool volume which was likely cause of abdominal pain. As patient was getting ready to be discharged however patient noted that in addition to right arm pain she had had an episode where she could not use her right upper extremity. This led to referral to [facility] for admission for TIA work-up.  History as per patient however she has very difficult to understand speech so history is somewhat limited in detail.  Patient tells me that she has been very sleepy since this morning. She is not sure if this has anything to do with her medications. She notes that before breakfast she had an episode where she was completely unable to move her right arm. She demonstrates that she had to lift up her right arm with her left hand in order to move it. She also notes that she was unable to move her right fingers. She was fed breakfast by the nursing staff at her SNF. She also believes she had an episode of syncope while she was sitting in her chair. She did not fall out of her chair. She does not know how long that lasted. She denies any injury. Patient is not sure when her right upper extremity functioning returned however notes that by the time she came here she was able to use her right arm. She did have some pain in her right arm which she had initially discussed with the ED provider but notes the right arm pain is much improved.  Patient sister who is at bedside  is concerned about an area of warmth and redness in her left lower extremity around a abrasion she sustained during a fall last week.   Patient herself denies any fevers or chills. She denies feeling confused. She does think she is much more awake now than when she came in earlier. She does not remember the syncope episode and is not quite sure how she knows that she had syncope. Patient denies headache, nausea or vomiting. Patient denies any diarrhea in fact she is quite constipated. No cough, no shortness of breath, no chest pain or palpitations.  11/4:Patient was admitted for possible TIA in the setting of advanced Parkinson's disease. She is also noted to have left lower extremity cellulitis and has been started on IV vancomycin. She is also noted to have marked somnolence versus syncope. 2D echocardiogram performed with LVEF 60-65% and grade 1 diastolic dysfunction. Carotid ultrasound ordered and pending. MRI cannot be obtained due to deep brain stimulator placement. Neurology consultation pending.  11/5:Appreciate neurology evaluation on 11/4 with plans for EEG which are still pending at the moment. Orthostatic vitals within normal limits otherwise. Plan [facility] once EEG tests have returned hopefully in a.m.  11/6:EEG performed today with results still pending. She remains on Keflex with improvement in her cellulitis noted.  11/7: EEG results are still pending.  She remains on Keflex and requires 2 more days of treatment to complete 7 days.  Unfortunately, she cannot go back to her assisted living facility  with his new medication until Monday 11/9.  11/8: EEG with noted bitemporal slowing.  Will review with neurology in a.m.  Continue Keflex through today and anticipate discharge back to ALF in a.m. if no further issues or concerns noted.  Assessment & Plan:   Principal Problem:   TIA (transient ischemic attack) Active  Problems:   Essential hypertension   Pseudobulbar affect   Depression, major   Parkinson's disease (HCC)   S/P deep brain stimulator placement   Syncope   Possible TIA in the setting of advanced parkinsonism -CT head with no acute findings, cannot perform brain MRI due to deep brain stimulator -2D echocardiogram with LVEF 60 to 65% with grade 1 diastolic dysfunction noted -Bilateral carotid ultrasound ordered with no significant plaque -Continue Plavix -Lipid panel with LDL 108 and A1c 5.6% -PT/OT evaluation with recommendations for SNF, however patient wants to go back to ALF with PT and ALF can accommodate her. -Neurology consultationappreciated with EEG performed on 11/6- will review with neurology in a.m. to ensure that antiepileptics are not needed prior to discharge.  Left lower extremity cellulitis-improving -Appears to be mild,blood cultures with no growth and MRSA PCR negative -Continue on Keflex for course of treatment for 1 more day to complete 7-day course of treatment  Somnolence versus syncope -We will plan to reevaluate and avoid sedating agents -Monitor on telemetry -2D echocardiogram without acute findings -Bilateral carotid ultrasoundwithout any acute findings  Heavy stool burden/constipation -Smog enemaas needed -Patient is having regular BM  Hypertension -Currently with soft blood pressure readings -Resume home amlodipine and losartan  Depression -Resume duloxetine and trazodone  Parkinson's disease with DBS -Continue Sinemet and Mirapex -Appreciate neurology evaluation  DVT prophylaxis:Lovenox Code Status:Full Family Communication:None at bedside Disposition Plan:Plan for DC back to [facility] by 11/9. EEG has been performed with results pending. She will not be able to go to ALF due to her new medication of Keflex at this time according to CSW.   Consultants:  Neurology  Procedures:  2D echocardiogram 11/4  EEG  11/6 with results pending  Antimicrobials:  Anti-infectives (From admission, onward)   Start     Dose/Rate Route Frequency Ordered Stop   03/31/19 1800  cephALEXin (KEFLEX) capsule 250 mg     250 mg Oral Every 6 hours 03/31/19 1503     03/30/19 2200  vancomycin (VANCOCIN) IVPB 750 mg/150 ml premix  Status:  Discontinued     750 mg 150 mL/hr over 60 Minutes Intravenous Every 24 hours 03/29/19 2115 03/30/19 1303   03/30/19 1800  ceFAZolin (ANCEF) IVPB 1 g/50 mL premix  Status:  Discontinued     1 g 100 mL/hr over 30 Minutes Intravenous Every 8 hours 03/30/19 1303 03/31/19 1503   03/29/19 2200  vancomycin (VANCOCIN) 1,250 mg in sodium chloride 0.9 % 250 mL IVPB     1,250 mg 166.7 mL/hr over 90 Minutes Intravenous  Once 03/29/19 2115 03/30/19 0119       Subjective: Patient seen and evaluated today with no new acute complaints or concerns. No acute concerns or events noted overnight.  Objective: Vitals:   04/01/19 2100 04/02/19 0548 04/02/19 2140 04/03/19 0601  BP: (!) 146/54 (!) 95/57 131/66 (!) 122/56  Pulse: 63 71 70 65  Resp: 20 17 17 17   Temp: 98.6 F (37 C) 98.5 F (36.9 C) 98.1 F (36.7 C) 97.9 F (36.6 C)  TempSrc: Oral  Oral Oral  SpO2: 96% 96% 93% 96%  Weight:  Height:        Intake/Output Summary (Last 24 hours) at 04/03/2019 1135 Last data filed at 04/03/2019 0900 Gross per 24 hour  Intake 720 ml  Output 950 ml  Net -230 ml   Filed Weights   03/29/19 1420  Weight: 67.1 kg    Examination:  General exam: Appears calm and comfortable  Respiratory system: Clear to auscultation. Respiratory effort normal. Cardiovascular system: S1 & S2 heard, RRR. No JVD, murmurs, rubs, gallops or clicks. No pedal edema. Gastrointestinal system: Abdomen is nondistended, soft and nontender. No organomegaly or masses felt. Normal bowel sounds heard. Central nervous system: Alert and oriented. No focal neurological deficits. Extremities: Symmetric 5 x 5 power. Skin: No  rashes, lesions or ulcers Psychiatry: Judgement and insight appear normal. Mood & affect appropriate.     Data Reviewed: I have personally reviewed following labs and imaging studies  CBC: Recent Labs  Lab 03/29/19 1448  WBC 6.1  NEUTROABS 3.1  HGB 14.1  HCT 42.7  MCV 96.6  PLT 0000000   Basic Metabolic Panel: Recent Labs  Lab 03/29/19 1448 03/30/19 0413 04/01/19 0452  NA 138 139  --   K 4.0 4.0  --   CL 103 104  --   CO2 27 25  --   GLUCOSE 103* 100*  --   BUN 26* 21  --   CREATININE 0.97 0.80 0.85  CALCIUM 9.4 9.0  --    GFR: Estimated Creatinine Clearance: 52.9 mL/min (by C-G formula based on SCr of 0.85 mg/dL). Liver Function Tests: Recent Labs  Lab 03/29/19 1448  AST 25  ALT 45*  ALKPHOS 161*  BILITOT 0.3  PROT 7.5  ALBUMIN 4.2   No results for input(s): LIPASE, AMYLASE in the last 168 hours. Recent Labs  Lab 03/29/19 1448  AMMONIA 15   Coagulation Profile: No results for input(s): INR, PROTIME in the last 168 hours. Cardiac Enzymes: No results for input(s): CKTOTAL, CKMB, CKMBINDEX, TROPONINI in the last 168 hours. BNP (last 3 results) No results for input(s): PROBNP in the last 8760 hours. HbA1C: No results for input(s): HGBA1C in the last 72 hours. CBG: Recent Labs  Lab 03/29/19 1515  GLUCAP 87   Lipid Profile: No results for input(s): CHOL, HDL, LDLCALC, TRIG, CHOLHDL, LDLDIRECT in the last 72 hours. Thyroid Function Tests: No results for input(s): TSH, T4TOTAL, FREET4, T3FREE, THYROIDAB in the last 72 hours. Anemia Panel: No results for input(s): VITAMINB12, FOLATE, FERRITIN, TIBC, IRON, RETICCTPCT in the last 72 hours. Sepsis Labs: Recent Labs  Lab 03/29/19 1448  LATICACIDVEN 1.3    Recent Results (from the past 240 hour(s))  Urine culture     Status: Abnormal   Collection Time: 03/29/19  2:35 PM   Specimen: Urine, Catheterized  Result Value Ref Range Status   Specimen Description   Final    URINE, CATHETERIZED Performed at  [facility], [address], [place], [place] [address]    Special Requests   Final    NONE Performed at [facility], [address] [place], [place] [address]    Culture MULTIPLE SPECIES PRESENT, SUGGEST RECOLLECTION (A)  Final   Report Status 03/30/2019 FINAL  Final  SARS CORONAVIRUS 2 (TAT 6-24 HRS) Nasopharyngeal Nasopharyngeal Swab     Status: None   Collection Time: 03/29/19  2:40 PM   Specimen: Nasopharyngeal Swab  Result Value Ref Range Status   SARS Coronavirus 2 NEGATIVE NEGATIVE Final    Comment: (NOTE) SARS-CoV-2 target nucleic acids are  NOT DETECTED. The SARS-CoV-2 RNA is generally detectable in upper and lower respiratory specimens during the acute phase of infection. Negative results do not preclude SARS-CoV-2 infection, do not rule out co-infections with other pathogens, and should not be used as the sole basis for treatment or other patient management decisions. Negative results must be combined with clinical observations, patient history, and epidemiological information. The expected result is Negative. Fact Sheet for Patients: SugarRoll.be Fact Sheet for Healthcare Providers: https://www.woods-mathews.com/ This test is not yet approved or cleared by the [place] FDA and  has been authorized for detection and/or diagnosis of SARS-CoV-2 by FDA under an Emergency Use Authorization (EUA). This EUA will remain  in effect (meaning this test can be used) for the duration of the COVID-19 declaration under Section 56 4(b)(1) of the Act, 21 U.S.C. section 360bbb-3(b)(1), unless the authorization is terminated or revoked sooner. Performed at [facility], [place] [address], [place], [place] [address]   Blood Cultures (routine x 2)     Status: None (Preliminary result)   Collection Time: 03/29/19  2:48 PM   Specimen: BLOOD LEFT ARM  Result Value Ref Range Status   Specimen Description BLOOD LEFT ARM  Final   Special  Requests   Final    BOTTLES DRAWN AEROBIC AND ANAEROBIC Blood Culture adequate volume   Culture   Final    NO GROWTH 4 DAYS Performed at [facility], [address] [place], [place] [address]    Report Status PENDING  Incomplete  Blood Cultures (routine x 2)     Status: None (Preliminary result)   Collection Time: 03/29/19  2:48 PM   Specimen: BLOOD RIGHT ARM  Result Value Ref Range Status   Specimen Description BLOOD RIGHT ARM  Final   Special Requests   Final    BOTTLES DRAWN AEROBIC AND ANAEROBIC Blood Culture adequate volume   Culture   Final    NO GROWTH 4 DAYS Performed at [facility], [address], [place], [place] [address]    Report Status PENDING  Incomplete  MRSA PCR Screening     Status: None   Collection Time: 03/30/19  6:21 AM   Specimen: Nasopharyngeal  Result Value Ref Range Status   MRSA by PCR NEGATIVE NEGATIVE Final    Comment:        The GeneXpert MRSA Assay (FDA approved for NASAL specimens only), is one component of a comprehensive MRSA colonization surveillance program. It is not intended to diagnose MRSA infection nor to guide or monitor treatment for MRSA infections. Performed at [facility], [address], [place], [place] [address]          Radiology Studies: No results found.      Scheduled Meds: . amLODipine  10 mg Oral Daily  . Carbidopa-Levodopa ER  1 tablet Oral TID  . cephALEXin  250 mg Oral Q6H  . clopidogrel  75 mg Oral Daily  . DULoxetine  60 mg Oral BID  . enoxaparin (LOVENOX) injection  40 mg Subcutaneous QHS  . losartan  100 mg Oral Daily  . oxybutynin  5 mg Oral BID  . pramipexole  1.5 mg Oral QHS  . sorbitol, milk of mag, mineral oil, glycerin (SMOG) enema  960 mL Rectal Once  . traZODone  100 mg Oral QHS   Continuous Infusions:   LOS: 4 days    Time spent: 30 minutes    Pratik Darleen Crocker, DO [facility] Pager [phone]  If 7PM-7AM, please contact  night-coverage [email or web address] Password [name]  04/03/2019, 11:35 AM

## 2019-04-04 LAB — CULTURE, BLOOD (ROUTINE X 2)
Culture: NO GROWTH
Culture: NO GROWTH
Special Requests: ADEQUATE
Special Requests: ADEQUATE

## 2019-04-04 LAB — CREATININE, SERUM
Creatinine, Ser: 0.83 mg/dL (ref 0.44–1.00)
GFR calc Af Amer: >60 mL/min (ref >60)
GFR calc non Af Amer: >60 mL/min (ref >60)

## 2019-04-04 MED ORDER — CARBIDOPA-LEVODOPA ER 25-100 MG PO TBCR: 1.0000 | EXTENDED_RELEASE_TABLET | Freq: Every morning | ORAL | 0 refills | Status: DC

## 2019-04-04 NOTE — Care Management Important Message (Signed)
Important Message  Patient Details  Name: Courtney Tucker MRN: [id number] Date of Birth: 07/13/1945   Medicare Important Message Given:  Yes     [name] 04/04/2019, 11:46 AM

## 2019-04-04 NOTE — NC FL2 (Signed)
[place] LEVEL OF CARE SCREENING TOOL     IDENTIFICATION  Patient Name: Courtney Tucker Birthdate: August 22, 1945 Sex: female Admission Date (Current Location): 03/29/2019  [facility] and [place] Number:  [employer] and Address:  [place] [address] [place]      Provider Number: [id number]  Attending Physician Name and Address:  Rodena Goldmann, DO  Relative Name and Phone Number:       Current Level of Care: Hospital Recommended Level of Care: [place] Prior Approval Number:    Date Approved/Denied:   PASRR Number:    Discharge Plan: Other (Comment)(ALF)    Current Diagnoses: Patient Active Problem List   Diagnosis Date Noted  . Syncope 03/30/2019  . TIA (transient ischemic attack) 03/29/2019  . 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 Parkinson's disease ([place]) 02/23/2015  . Encounter for screening colonoscopy 11/14/2013  . Dysphagia, unspecified(787.20) 11/14/2013  . Parkinson disease ([place]) 08/05/2013  . Depression, major 07/25/2013  . Anxiety 12/30/2012  . Paralysis agitans ([place]) 10/15/2012  . 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 Abnormality of gait 10/15/2012  . Dysphasia 10/15/2012  . HYPERLIPIDEMIA-MIXED 02/14/2009  . Essential hypertension 02/14/2009  . DYSPNEA 02/14/2009  . CHEST PAIN-UNSPECIFIED 02/14/2009    Orientation RESPIRATION BLADDER Height & Weight     Self, Time, Situation, Place  Normal Incontinent Weight: 148 lb (67.1 kg) Height:  5\' 2"  (157.5 cm)  BEHAVIORAL SYMPTOMS/MOOD NEUROLOGICAL BOWEL NUTRITION STATUS      Incontinent Diet  AMBULATORY STATUS COMMUNICATION OF NEEDS Skin   Extensive Assist Verbally Normal                       Personal Care Assistance Level of Assistance  Bathing, Feeding, Dressing Bathing Assistance: Maximum assistance Feeding assistance: Maximum assistance Dressing Assistance: Maximum assistance     Functional Limitations  Info  Sight, Hearing, Speech Sight Info: Adequate Hearing Info: Adequate Speech Info: Impaired    SPECIAL CARE FACTORS FREQUENCY                       Contractures Contractures Info: Not present    Additional Factors Info  Code Status, Allergies Code Status Info: full Allergies Info: oxycodone, aspirin, codeine           Current Medications (04/04/2019):  This is the current hospital active medication list Current Facility-Administered Medications  Medication Dose Route Frequency Provider Last Rate Last Dose  . acetaminophen (TYLENOL) tablet 500 mg  500 mg Oral Q6H PRN Vashti Hey, MD   500 mg at 04/03/19 0957  . amLODipine (NORVASC) tablet 10 mg  10 mg Oral Daily Manuella Ghazi, Pratik D, DO   10 mg at 04/04/19 0950  . Carbidopa-Levodopa ER (SINEMET CR) 25-100 MG tablet controlled release 1 tablet  1 tablet Oral TID Vashti Hey, MD   1 tablet at 04/04/19 0951  . cephALEXin (KEFLEX) capsule 250 mg  250 mg Oral Q6H Shah, Pratik D, DO   250 mg at 04/04/19 1049  . clopidogrel (PLAVIX) tablet 75 mg  75 mg Oral Daily Bonnell Public Tublu, MD   75 mg at 04/04/19 0950  . DULoxetine (CYMBALTA) DR capsule 60 mg  60 mg Oral BID Heath Lark D, DO   60 mg at 04/04/19 0950  . enoxaparin (LOVENOX) injection 40 mg  40 mg Subcutaneous QHS Bonnell Public Tublu, MD   40 mg at 04/03/19 2201  .  losartan (COZAAR) tablet 100 mg  100 mg Oral Daily Manuella Ghazi, Pratik D, DO   100 mg at 04/04/19 0950  . oxybutynin (DITROPAN) tablet 5 mg  5 mg Oral BID Bonnell Public Tublu, MD   5 mg at 04/04/19 0950  . pramipexole (MIRAPEX) tablet 1.5 mg  1.5 mg Oral QHS Bonnell Public Tublu, MD   1.5 mg at 04/03/19 2201  . sorbitol, milk of mag, mineral oil, glycerin (SMOG) enema  960 mL Rectal Once Manuella Ghazi, Pratik D, DO      . traZODone (DESYREL) tablet 100 mg  100 mg Oral QHS Manuella Ghazi, Pratik D, DO   100 mg at 04/03/19 2201     Discharge Medications:     Medication List    STOP  taking these medications   ALPRAZolam 0.5 MG tablet Commonly known as: XANAX   traMADol 50 MG tablet Commonly known as: ULTRAM     TAKE these medications   acetaminophen 500 MG tablet Commonly known as: TYLENOL Take 500 mg by mouth every 6 (six) hours as needed for mild pain or moderate pain.   amLODipine 10 MG tablet Commonly known as: NORVASC Take 10 mg by mouth daily.   Carbidopa-Levodopa ER 25-100 MG tablet controlled release Commonly known as: SINEMET CR Take 1 tablet by mouth every morning.   clopidogrel 75 MG tablet Commonly known as: PLAVIX Take 75 mg by mouth daily.   DULoxetine 60 MG capsule Commonly known as: CYMBALTA Take 60 mg by mouth 2 (two) times daily.   losartan 100 MG tablet Commonly known as: COZAAR Take 100 mg by mouth daily.   omeprazole 20 MG capsule Commonly known as: PRILOSEC Take 20 mg by mouth daily.   oxybutynin 5 MG tablet Commonly known as: DITROPAN Take 5 mg by mouth 2 (two) times daily.   pramipexole 1 MG tablet Commonly known as: MIRAPEX Take 1.5 mg by mouth at bedtime.   traZODone 100 MG tablet Commonly known as: DESYREL Take 100 mg by mouth at bedtime.   Vitamin D 50 MCG (2000 UT) tablet Take 2,000 Units by mouth daily.     Relevant Imaging Results:  Relevant Lab Results:   Additional Information Home Health PT, OT, and RN  Shade Flood, LCSW

## 2019-04-04 NOTE — NC FL2 (Deleted)
[place] LEVEL OF CARE SCREENING TOOL     IDENTIFICATION  Patient Name: Courtney Tucker Birthdate: 01/16/1946 Sex: female Admission Date (Current Location): 03/29/2019  [facility] and [place] Number:  [employer] and Address:  [place] [address], [place]      Provider Number: [id number]  Attending Physician Name and Address:  Rodena Goldmann, DO  Relative Name and Phone Number:       Current Level of Care: Hospital Recommended Level of Care: [place] Prior Approval Number:    Date Approved/Denied:   PASRR Number:    Discharge Plan: Other (Comment)(ALF)    Current Diagnoses: Patient Active Problem List   Diagnosis Date Noted  . Syncope 03/30/2019  . TIA (transient ischemic attack) 03/29/2019  . 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 Parkinson's disease ([place]) 02/23/2015  . Encounter for screening colonoscopy 11/14/2013  . Dysphagia, unspecified(787.20) 11/14/2013  . Parkinson disease ([place]) 08/05/2013  . Depression, major 07/25/2013  . Anxiety 12/30/2012  . Paralysis agitans ([place]) 10/15/2012  . 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 Abnormality of gait 10/15/2012  . Dysphasia 10/15/2012  . HYPERLIPIDEMIA-MIXED 02/14/2009  . Essential hypertension 02/14/2009  . DYSPNEA 02/14/2009  . CHEST PAIN-UNSPECIFIED 02/14/2009    Orientation RESPIRATION BLADDER Height & Weight     Self, Time, Situation, Place  Normal Incontinent Weight: 148 lb (67.1 kg) Height:  5\' 2"  (157.5 cm)  BEHAVIORAL SYMPTOMS/MOOD NEUROLOGICAL BOWEL NUTRITION STATUS      Incontinent Diet  AMBULATORY STATUS COMMUNICATION OF NEEDS Skin   Extensive Assist Verbally Normal                       Personal Care Assistance Level of Assistance  Bathing, Feeding, Dressing Bathing Assistance: Maximum assistance Feeding assistance: Maximum assistance Dressing Assistance: Maximum assistance     Functional Limitations  Info  Sight, Hearing, Speech Sight Info: Adequate Hearing Info: Adequate Speech Info: Impaired    SPECIAL CARE FACTORS FREQUENCY                       Contractures Contractures Info: Not present    Additional Factors Info  Code Status, Allergies Code Status Info: full Allergies Info: oxycodone, aspirin, codeine           Current Medications (04/04/2019):  This is the current hospital active medication list Current Facility-Administered Medications  Medication Dose Route Frequency Provider Last Rate Last Dose  . acetaminophen (TYLENOL) tablet 500 mg  500 mg Oral Q6H PRN Vashti Hey, MD   500 mg at 04/03/19 0957  . amLODipine (NORVASC) tablet 10 mg  10 mg Oral Daily Manuella Ghazi, Pratik D, DO   10 mg at 04/04/19 0950  . Carbidopa-Levodopa ER (SINEMET CR) 25-100 MG tablet controlled release 1 tablet  1 tablet Oral TID Vashti Hey, MD   1 tablet at 04/04/19 0951  . cephALEXin (KEFLEX) capsule 250 mg  250 mg Oral Q6H Shah, Pratik D, DO   250 mg at 04/04/19 1049  . clopidogrel (PLAVIX) tablet 75 mg  75 mg Oral Daily Bonnell Public Tublu, MD   75 mg at 04/04/19 0950  . DULoxetine (CYMBALTA) DR capsule 60 mg  60 mg Oral BID Heath Lark D, DO   60 mg at 04/04/19 0950  . enoxaparin (LOVENOX) injection 40 mg  40 mg Subcutaneous QHS Bonnell Public Tublu, MD   40 mg at 04/03/19 2201  .  losartan (COZAAR) tablet 100 mg  100 mg Oral Daily Manuella Ghazi, Pratik D, DO   100 mg at 04/04/19 0950  . oxybutynin (DITROPAN) tablet 5 mg  5 mg Oral BID Bonnell Public Tublu, MD   5 mg at 04/04/19 0950  . pramipexole (MIRAPEX) tablet 1.5 mg  1.5 mg Oral QHS Bonnell Public Tublu, MD   1.5 mg at 04/03/19 2201  . sorbitol, milk of mag, mineral oil, glycerin (SMOG) enema  960 mL Rectal Once Manuella Ghazi, Pratik D, DO      . traZODone (DESYREL) tablet 100 mg  100 mg Oral QHS Manuella Ghazi, Pratik D, DO   100 mg at 04/03/19 2201     Discharge Medications:  TAKE these medications    acetaminophen 500 MG tablet Commonly known as: TYLENOL Take 500 mg by mouth every 6 (six) hours as needed for mild pain or moderate pain.   amLODipine 10 MG tablet Commonly known as: NORVASC Take 10 mg by mouth daily.   Carbidopa-Levodopa ER 25-100 MG tablet controlled release Commonly known as: SINEMET CR TAKE 1 TABLET BY MOUTH THREE TIMES A DAY What changed: when to take this   clopidogrel 75 MG tablet Commonly known as: PLAVIX Take 75 mg by mouth daily.   DULoxetine 60 MG capsule Commonly known as: CYMBALTA Take 60 mg by mouth 2 (two) times daily.   losartan 100 MG tablet Commonly known as: COZAAR Take 100 mg by mouth daily.   oxybutynin 5 MG tablet Commonly known as: DITROPAN Take 5 mg by mouth 2 (two) times daily.   pramipexole 1 MG tablet Commonly known as: MIRAPEX Take 1.5 mg by mouth at bedtime.   traZODone 100 MG tablet Commonly known as: DESYREL Take 100 mg by mouth at bedtime.   Vitamin D 50 MCG (2000 UT) tablet Take 2,000 Units by mouth daily.      Relevant Imaging Results:  Relevant Lab Results:   Additional Information Home Health PT, OT, and RN  Shade Flood, LCSW

## 2019-04-04 NOTE — Progress Notes (Signed)
Report called and given to staff at [facility].

## 2019-04-04 NOTE — TOC Transition Note (Signed)
Transition of Care [facility]) - CM/SW Discharge Note   Patient Details  Name: Courtney Tucker MRN: [id number] Date of Birth: 1946-04-29  Transition [facility]) CM/SW Contact:  Shade Flood, LCSW Phone Number: 04/04/2019, 11:04 AM   Clinical Narrative:     Pt stable for dc today per MD. Plan remains for dc back to [facility] ALF. Updated pt's guardian, Ulice Bold, of pt's dc. Updated Sunday Spillers at [facility]. DC clinical sent electronically to [facility] and to pt's DSS guardian. EMS arranged for 1300.  Updated RN. She will call report and complete transport form.  There are no other TOC needs for dc.  Final next level of care: Assisted Living Barriers to Discharge: Barriers Resolved   Patient Goals and CMS Choice   CMS Medicare.gov Compare Post Acute Care list provided to:: Patient Choice offered to / list presented to : Patient  Discharge Placement                       Discharge Plan and Services In-house Referral: Clinical Social Work   Post Acute Care Choice: Home Health                    HH Arranged: RN, PT, OT [facility] Agency: [name] Date [place]: 04/04/19   Representative spoke with at [place] ([place]) Interventions     Readmission Risk Interventions Readmission Risk Prevention Plan 03/30/2019  Medication Screening Complete  Transportation Screening Complete  Some recent data might be hidden

## 2019-04-04 NOTE — TOC Progression Note (Signed)
Medicare.gov - the Conservation officer, historic buildings for [place] health agencies that serve [place], [place]. [place] Quality of Patient Care Rating Patient Survey Summary Rating [facility] [phone] 3 out of 5 stars 5 out of [place] [phone] 2  out of 5 stars 4 out of [place] [phone] 4  out of 5 stars 5 out of [place] [phone]) [phone] 4  out of 5 stars 4 out of [place] [phone]) [phone] 4  out of 5 stars 5 out of [place] [phone] 4 out of 5 stars 4 out of [place] [phone] out of 5 stars 4 out of [place] AGE [phone] 2  out of 5 stars 5 out of 5 stars [name] [phone] 3  out of 5 stars 4 out of [place] [phone] 3 out of 5 stars 4 out of 5 stars [facility] [phone] 4  out of 5 stars 3 out of [place] [phone] 4  out of 5 stars 4 out of [place] number Footnote as displayed on [place] 1 This agency provides services under a federal waiver program to non-traditional, chronic long term population. 2 This agency provides services to a special needs population. 3 Not Available. 4 The number of patient episodes for this measure is too small to report. 5 This measure currently does not have data or provider has been certified/recertified for less than 6 months. 6 The national average for this measure is not provided because of state-to-state differences in data collection. 7 Medicare is not displaying rates for this measure for any home health agency, because of an issue  with the data. 8 There were problems with the data and they are being corrected. 9 Zero, or very few, patients met the survey's rules for inclusion. The scores shown, if any, reflect a very small number of surveys and may not accurately tell how an agency is doing. 10 Survey results are based on less than 12 months of data. 11 Fewer than 70 patients completed the survey. Use the scores shown, if any, with caution as the number of surveys may be too low to accurately tell how an agency is doing. 12 No survey results are available for this period. 13 Data suppressed by CMS for one or more quarters.

## 2019-04-04 NOTE — Progress Notes (Signed)
Nsg Discharge Note  Admit Date:  03/29/2019 Discharge date: 04/04/2019   Allayne Stack to be D/C'd to [facility]F per MD order.  AVS completed.  Copy for chart, and copy for patient signed, and dated. Patient/caregiver able to verbalize understanding.  Discharge Medication: Allergies as of 04/04/2019      Reactions   Oxycodone Other (See Comments)   Can tolerate low dose mixed with tynlelol HALLUCINATIONS    Aspirin Nausea And Vomiting, Other (See Comments)   Severe stomach pain    Codeine Nausea Only   Makes her feel very sick       Medication List    STOP taking these medications   ALPRAZolam 0.5 MG tablet Commonly known as: XANAX   traMADol 50 MG tablet Commonly known as: ULTRAM     TAKE these medications   acetaminophen 500 MG tablet Commonly known as: TYLENOL Take 500 mg by mouth every 6 (six) hours as needed for mild pain or moderate pain.   amLODipine 10 MG tablet Commonly known as: NORVASC Take 10 mg by mouth daily.   Carbidopa-Levodopa ER 25-100 MG tablet controlled release Commonly known as: SINEMET CR Take 1 tablet by mouth every morning.   clopidogrel 75 MG tablet Commonly known as: PLAVIX Take 75 mg by mouth daily.   DULoxetine 60 MG capsule Commonly known as: CYMBALTA Take 60 mg by mouth 2 (two) times daily.   losartan 100 MG tablet Commonly known as: COZAAR Take 100 mg by mouth daily.   omeprazole 20 MG capsule Commonly known as: PRILOSEC Take 20 mg by mouth daily.   oxybutynin 5 MG tablet Commonly known as: DITROPAN Take 5 mg by mouth 2 (two) times daily.   pramipexole 1 MG tablet Commonly known as: MIRAPEX Take 1.5 mg by mouth at bedtime.   traZODone 100 MG tablet Commonly known as: DESYREL Take 100 mg by mouth at bedtime.   Vitamin D 50 MCG (2000 UT) tablet Take 2,000 Units by mouth daily.       Discharge Assessment: Vitals:   04/04/19 0509 04/04/19 1247  BP: (!) 122/47 (!) 130/57  Pulse: (!) 57 78  Resp: 17 19   Temp: 98.2 F (36.8 C) 97.8 F (36.6 C)  SpO2: 98% 100%   Skin clean, dry and intact without evidence of skin break down, no evidence of skin tears noted. IV catheter discontinued intact. Site without signs and symptoms of complications - no redness or edema noted at insertion site, patient denies c/o pain - only slight tenderness at site.  Dressing with slight pressure applied.  D/c Instructions-Education: Discharge instructions given to patient/family with verbalized understanding. D/c education completed with patient/family including follow up instructions, medication list, d/c activities limitations if indicated, with other d/c instructions as indicated by MD - patient able to verbalize understanding, all questions fully answered. Patient instructed to return to ED, call 911, or call MD for any changes in condition.  Patient escorted via [place], and D/C home via private auto.  Carney Corners, RN 04/04/2019 1:54 PM

## 2019-04-04 NOTE — Discharge Summary (Addendum)
Physician Discharge Summary  CANDASE FRUM [id number] DOB: [birth year] DOA: 03/29/2019  PCP: Scotty Court, DO  Admit date: 03/29/2019  Discharge date: 04/04/2019  Admitted From:ALF  Disposition:  ALF  Recommendations for Outpatient Follow-up:  1. Follow up with PCP in 1-2 weeks 2. Follow-up with Dr. Merlene Laughter in 4 weeks 3. Continue on prior home medications as prescribed with no new medication changes  Home Health: Yes with PT, RN  Equipment/Devices: None  Discharge Condition: Stable  CODE STATUS: Full  Diet recommendation: Heart Healthy  Brief/Interim Summary: Per HPI: Courtney Farfan McDanielis an 73 y.o.femalewith past medical history significant for advanced Parkinson's disease with deep brain stimulator, dysphagia, severe dysarthria and pseudobulbar affect, hypertension,GERD and previous TIA who was evaluated in the ED for complaints of syncope, somnolence abdominal pain and right arm pain. Extensive work-up was essentially normal except for marked stool volume which was likely cause of abdominal pain. As patient was getting ready to be discharged however patient noted that in addition to right arm pain she had had an episode where she could not use her right upper extremity. This led to referral to [facility] for admission for TIA work-up.  History as per patient however she has very difficult to understand speech so history is somewhat limited in detail.  Patient tells me that she has been very sleepy since this morning. She is not sure if this has anything to do with her medications. She notes that before breakfast she had an episode where she was completely unable to move her right arm. She demonstrates that she had to lift up her right arm with her left hand in order to move it. She also notes that she was unable to move her right fingers. She was fed breakfast by the nursing staff at her SNF. She also believes she had an episode of syncope while  she was sitting in her chair. She did not fall out of her chair. She does not know how long that lasted. She denies any injury. Patient is not sure when her right upper extremity functioning returned however notes that by the time she came here she was able to use her right arm. She did have some pain in her right arm which she had initially discussed with the ED provider but notes the right arm pain is much improved.  Patient sister who is at bedside is concerned about an area of warmth and redness in her left lower extremity around a abrasion she sustained during a fall last week.   Patient herself denies any fevers or chills. She denies feeling confused. She does think she is much more awake now than when she came in earlier. She does not remember the syncope episode and is not quite sure how she knows that she had syncope. Patient denies headache, nausea or vomiting. Patient denies any diarrhea in fact she is quite constipated. No cough, no shortness of breath, no chest pain or palpitations.  11/4:Patient was admitted for possible TIA in the setting of advanced Parkinson's disease. She is also noted to have left lower extremity cellulitis and has been started on IV vancomycin. She is also noted to have marked somnolence versus syncope. 2D echocardiogram performed with LVEF 60-65% and grade 1 diastolic dysfunction. Carotid ultrasound ordered and pending. MRI cannot be obtained due to deep brain stimulator placement. Neurology consultation pending.  11/5:Appreciate neurology evaluation on 11/4 with plans for EEG which are still pending at the moment. Orthostatic vitals within normal limits otherwise.  Plan for discharge back to [facility] once EEG tests have returned hopefully in a.m.  11/6:EEG performed today with results still pending. She remains on Keflex with improvement in her cellulitis noted.  11/7:EEG results are still pending. She remains  on Keflex and requires 2 more days of treatment to complete 7 days. Unfortunately, she cannot go back to her assisted living facility with his new medication until Monday 11/9.  11/8: EEG with noted bitemporal slowing.  Will review with neurology in a.m.  Continue Keflex through today and anticipate discharge back to ALF in a.m. if no further issues or concerns noted.  11/9: Patient has completed her course of Keflex for cellulitis with no further significant issues noted.  EEG reviewed by neurology with no further recommendations at this point other than to continue her usual home medications.  PT has recommended SNF, however her ALF will be able to provide adequate home PT and nursing care.  No other acute events during the course of this hospitalization.  She is stable for discharge today.  Discharge Diagnoses:  Principal Problem:   TIA (transient ischemic attack) Active Problems:   Essential hypertension   Pseudobulbar affect   Depression, major   Parkinson's disease ([place])   S/P deep brain stimulator placement   Syncope  Principal discharge diagnosis: Possible TIA in the setting of advanced parkinsonism along with left lower extremity cellulitis.  Discharge Instructions  Discharge Instructions    Diet - low sodium heart healthy   Complete by: As directed    Increase activity slowly   Complete by: As directed      Allergies as of 04/04/2019      Reactions   Oxycodone Other (See Comments)   Can tolerate low dose mixed with tynlelol HALLUCINATIONS    Aspirin Nausea And Vomiting, Other (See Comments)   Severe stomach pain    Codeine Nausea Only   Makes her feel very sick       Medication List    STOP taking these medications   ALPRAZolam 0.5 MG tablet Commonly known as: XANAX   traMADol 50 MG tablet Commonly known as: ULTRAM     TAKE these medications   acetaminophen 500 MG tablet Commonly known as: TYLENOL Take 500 mg by mouth every 6 (six) hours as needed for  mild pain or moderate pain.   amLODipine 10 MG tablet Commonly known as: NORVASC Take 10 mg by mouth daily.   Carbidopa-Levodopa ER 25-100 MG tablet controlled release Commonly known as: SINEMET CR Take 1 tablet by mouth every morning.   clopidogrel 75 MG tablet Commonly known as: PLAVIX Take 75 mg by mouth daily.   DULoxetine 60 MG capsule Commonly known as: CYMBALTA Take 60 mg by mouth 2 (two) times daily.   losartan 100 MG tablet Commonly known as: COZAAR Take 100 mg by mouth daily.   omeprazole 20 MG capsule Commonly known as: PRILOSEC Take 20 mg by mouth daily.   oxybutynin 5 MG tablet Commonly known as: DITROPAN Take 5 mg by mouth 2 (two) times daily.   pramipexole 1 MG tablet Commonly known as: MIRAPEX Take 1.5 mg by mouth at bedtime.   traZODone 100 MG tablet Commonly known as: DESYREL Take 100 mg by mouth at bedtime.   Vitamin D 50 MCG (2000 UT) tablet Take 2,000 Units by mouth daily.      Follow-up Information    Octavio Graves P, DO Follow up in 1 week(s).   Contact information: [phone]  [place] [address] [place] [address] [phone]        Phillips Odor, MD Follow up in 4 week(s).   Specialty: Neurology Contact information: [address] [place] [address] [phone]          Allergies  Allergen Reactions  . Oxycodone Other (See Comments)    Can tolerate low dose mixed with tynlelol HALLUCINATIONS   . Aspirin Nausea And Vomiting and Other (See Comments)    Severe stomach pain   . Codeine Nausea Only    Makes her feel very sick     Consultations:  Neurology   Procedures/Studies: Dg Chest 1 View  Result Date: 03/29/2019 CLINICAL DATA:  Altered mental status EXAM: CHEST  1 VIEW COMPARISON:  12/30/2018 FINDINGS: Cardiac shadow is stable. Stimulator pack is again seen over the right chest wall. The lungs are well aerated bilaterally. No focal infiltrate or sizable effusion is seen. No acute bony abnormality is noted.  Postsurgical changes in the cervical spine are seen. IMPRESSION: No acute abnormality noted. Electronically Signed   By: Inez Catalina M.D.   On: 03/29/2019 15:44   Dg Lumbar Spine Complete  Result Date: 03/20/2019 CLINICAL DATA:  Fall, pain EXAM: LUMBAR SPINE - COMPLETE 4+ VIEW COMPARISON:  07/08/2016 lumbar spine radiograph FINDINGS: This report assumes 5 non rib-bearing lumbar vertebrae. Slight dextrocurvature of the lumbar spine. Status post bilateral posterior spinal fixation at L4-5 with bilateral pedicle screws and bone cage in the L4-5 disc space, with no evidence of hardware fracture or loosening. Lumbar vertebral body heights are preserved, with no fracture. Severe multilevel lumbar degenerative disc disease, most prominent at L1-2 and L2-3, worsened. Stable 4 mm retrolisthesis at L1-2 and L2-3. Moderate bilateral lumbar facet arthropathy. No aggressive appearing focal osseous lesions. Surgical clips overlie the right upper quadrant. IMPRESSION: 1. No lumbar spine fracture. 2. Status post bilateral posterior spinal fixation at L4-5, with no hardware complication. 3. Severe multilevel lumbar degenerative disc disease, worsened. 4. Stable multilevel lumbar spondylolisthesis. Electronically Signed   By: Ilona Sorrel M.D.   On: 03/20/2019 13:20   Dg Ankle Complete Left  Result Date: 03/20/2019 CLINICAL DATA:  Fall, left ankle pain EXAM: LEFT ANKLE COMPLETE - 3+ VIEW COMPARISON:  None. FINDINGS: Generalized soft tissue swelling. No fracture or subluxation no suspicious focal osseous lesions. Tiny Achilles left calcaneal spur. No radiopaque foreign body. IMPRESSION: Generalized soft tissue swelling in the left ankle. No fracture or subluxation in the left ankle. Electronically Signed   By: Ilona Sorrel M.D.   On: 03/20/2019 13:16   Ct Head Wo Contrast  Result Date: 03/29/2019 CLINICAL DATA:  Syncopal episode today as well as a fall 2 weeks ago. Right arm and rib pain. Right arm weakness. EXAM: CT  HEAD WITHOUT CONTRAST CT CERVICAL SPINE WITHOUT CONTRAST TECHNIQUE: Multidetector CT imaging of the head and cervical spine was performed following the standard protocol without intravenous contrast. Multiplanar CT image reconstructions of the cervical spine were also generated. COMPARISON:  Head CT 08/05/2017. FINDINGS: CT HEAD FINDINGS Brain: Deep brain stimulator leads remain in place extending to the subthalamic regions bilaterally. Within limitations of artifact associated with the leads, no acute infarct, intracranial hemorrhage, mass, midline shift, or extra-axial fluid collection is identified. Hypodensities in the cerebral white matter bilaterally are similar to the prior study and nonspecific but compatible with mild chronic small vessel ischemic disease. The ventricles and sulci are within normal limits for age. Vascular: Calcified atherosclerosis at the skull base. No  hyperdense vessel. Skull: No fracture or focal osseous lesion. Sinuses/Orbits: Visualized paranasal sinuses and mastoid air cells are clear. Orbits are unremarkable. Other: None. CT CERVICAL SPINE FINDINGS Alignment: Slight anterolisthesis of C6 on C7, C7 on T1, and T1 on T2. Skull base and vertebrae: No acute fracture or suspicious osseous lesion. C5-C7 ACDF with solid arthrodesis at C5-6. Lucency through the C6-7 disc space with endplate sclerosis suggesting pseudoarthrosis. Soft tissues and spinal canal: No evidence of prevertebral soft tissue swelling within limitations of streak artifact. Disc levels: Mild disc space narrowing at C3-4 and C7-T1 with moderate narrowing at C4-5. Moderate multilevel facet arthrosis, right greater than left. Mild-to-moderate right neural foraminal stenosis at C4-5 due to uncovertebral and facet spurring. Upper chest: Mild motion artifact and nonspecific mosaic attenuation in the lung apices. No pneumothorax. Other: None. IMPRESSION: 1. No evidence of acute intracranial abnormality. 2. Mild chronic small  vessel ischemic disease. 3. No evidence of acute cervical spine fracture. 4. 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 Electronically Signed   By: Logan Bores M.D.   On: 03/29/2019 16:45   Ct Cervical Spine Wo Contrast  Result Date: 03/29/2019 CLINICAL DATA:  Syncopal episode today as well as a fall 2 weeks ago. Right arm and rib pain. Right arm weakness. EXAM: CT HEAD WITHOUT CONTRAST CT CERVICAL SPINE WITHOUT CONTRAST TECHNIQUE: Multidetector CT imaging of the head and cervical spine was performed following the standard protocol without intravenous contrast. Multiplanar CT image reconstructions of the cervical spine were also generated. COMPARISON:  Head CT 08/05/2017. FINDINGS: CT HEAD FINDINGS Brain: Deep brain stimulator leads remain in place extending to the subthalamic regions bilaterally. Within limitations of artifact associated with the leads, no acute infarct, intracranial hemorrhage, mass, midline shift, or extra-axial fluid collection is identified. Hypodensities in the cerebral white matter bilaterally are similar to the prior study and nonspecific but compatible with mild chronic small vessel ischemic disease. The ventricles and sulci are within normal limits for age. Vascular: Calcified atherosclerosis at the skull base. No hyperdense vessel. Skull: No fracture or focal osseous lesion. Sinuses/Orbits: Visualized paranasal sinuses and mastoid air cells are clear. Orbits are unremarkable. Other: None. CT CERVICAL SPINE FINDINGS Alignment: Slight anterolisthesis of C6 on C7, C7 on T1, and T1 on T2. Skull base and vertebrae: No acute fracture or suspicious osseous lesion. C5-C7 ACDF with solid arthrodesis at C5-6. Lucency through the C6-7 disc space with endplate sclerosis suggesting pseudoarthrosis. Soft tissues and spinal canal: No evidence of prevertebral soft tissue swelling within limitations of streak artifact. Disc levels: Mild disc space narrowing at C3-4  and C7-T1 with moderate narrowing at C4-5. Moderate multilevel facet arthrosis, right greater than left. Mild-to-moderate right neural foraminal stenosis at C4-5 due to uncovertebral and facet spurring. Upper chest: Mild motion artifact and nonspecific mosaic attenuation in the lung apices. No pneumothorax. Other: None. IMPRESSION: 1. No evidence of acute intracranial abnormality. 2. Mild chronic small vessel ischemic disease. 3. No evidence of acute cervical spine fracture. 4. 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 Electronically Signed   By: Logan Bores M.D.   On: 03/29/2019 16:45   Ct Abdomen Pelvis W Contrast  Result Date: 03/29/2019 CLINICAL DATA:  Right-sided abdominal pain. Syncopal episode. EXAM: CT ABDOMEN AND PELVIS WITH CONTRAST TECHNIQUE: Multidetector CT imaging of the abdomen and pelvis was performed using the standard protocol following bolus administration of intravenous contrast. CONTRAST:  141mL OMNIPAQUE IOHEXOL 300 MG/ML  SOLN  COMPARISON:  None. FINDINGS: Lower Chest: No acute findings. Hepatobiliary: No hepatic masses identified. Prior cholecystectomy. No evidence of biliary obstruction. Pancreas:  No mass or inflammatory changes. Spleen: Within normal limits in size and appearance. Adrenals/Urinary Tract: No masses identified. A 2 mm calculus is seen in the upper pole of the right kidney. No evidence of ureteral calculi or hydronephrosis. Unremarkable unopacified urinary bladder. Stomach/Bowel: Small hiatal hernia. No evidence of obstruction, inflammatory process or abnormal fluid collections. Normal appendix visualized. Large stool burden noted throughout the colon. Vascular/Lymphatic: No pathologically enlarged lymph nodes. No abdominal aortic aneurysm. Reproductive: Prior hysterectomy noted. Adnexal regions are unremarkable in appearance. Other:  None. Musculoskeletal:  No suspicious bone lesions identified. IMPRESSION: No evidence of appendicitis  or other acute findings. Large stool burden noted; recommend clinical correlation for possible constipation. Tiny nonobstructing right renal calculus. Small hiatal hernia. Electronically Signed   By: Marlaine Hind M.D.   On: 03/29/2019 18:08   US Carotid Bilateral  Result Date: 03/30/2019 CLINICAL DATA:  73 year old female with a TIA EXAM: BILATERAL CAROTID DUPLEX ULTRASOUND TECHNIQUE: Pearline Cables scale imaging, color Doppler and duplex ultrasound were performed of bilateral carotid and vertebral arteries in the neck. COMPARISON:  None available FINDINGS: Criteria: Quantification of carotid stenosis is based on velocity parameters that correlate the residual internal carotid diameter with NASCET-based stenosis levels, using the diameter of the distal internal carotid lumen as the denominator for stenosis measurement. The following velocity measurements were obtained: RIGHT ICA:  Systolic 82 cm/sec, Diastolic 20 cm/sec CCA:  91 cm/sec SYSTOLIC ICA/CCA RATIO:  0.9 ECA:  113 cm/sec LEFT ICA:  Systolic 96 cm/sec, Diastolic 13 cm/sec CCA:  [id number] cm/sec SYSTOLIC ICA/CCA RATIO:  0.8 ECA:  134 cm/sec Right Brachial SBP: Not acquired Left Brachial SBP: Not acquired RIGHT CAROTID ARTERY: No significant calcifications of the right common carotid artery. Intermediate waveform maintained. Heterogeneous and partially calcified plaque at the right carotid bifurcation. No significant lumen shadowing. Low resistance waveform of the right ICA. No significant tortuosity. RIGHT VERTEBRAL ARTERY: Antegrade flow with low resistance waveform. LEFT CAROTID ARTERY: No significant calcifications of the left common carotid artery. Intermediate waveform maintained. Heterogeneous and partially calcified plaque at the left carotid bifurcation without significant lumen shadowing. Low resistance waveform of the left ICA. No significant tortuosity. LEFT VERTEBRAL ARTERY:  Antegrade flow with low resistance waveform. IMPRESSION: Color duplex indicates  minimal heterogeneous and calcified plaque, with no hemodynamically significant stenosis by duplex criteria in the extracranial cerebrovascular circulation. Signed, Dulcy Fanny. Dellia Nims, RPVI [facility] Electronically Signed   By: Corrie Mckusick D.O.   On: 03/30/2019 14:50   Dg Knee Complete 4 Views Left  Result Date: 03/20/2019 CLINICAL DATA:  Fall, left knee pain EXAM: LEFT KNEE - COMPLETE 4+ VIEW COMPARISON:  None. FINDINGS: No fracture or dislocation. Suboptimally oblique positioning of the lateral view, precluding accurate assessment for joint effusion. No suspicious focal osseous lesion. Minimal lateral and patellofemoral compartment osteoarthritis. No radiopaque foreign body. IMPRESSION: No fracture or dislocation in the left knee. 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 Electronically Signed   By: Ilona Sorrel M.D.   On: 03/20/2019 13:13   Dg Hip Unilat W Or Wo Pelvis 2-3 Views Left  Result Date: 03/20/2019 CLINICAL DATA:  Fall, left hip pain EXAM: DG HIP (WITH OR WITHOUT PELVIS) 2-3V LEFT COMPARISON:  12/30/2012 pelvic radiograph FINDINGS: No pelvic fracture or diastasis. No left hip fracture or dislocation. No suspicious focal osseous  lesions. Posterior bilateral fixation hardware in the lower lumbar spine. IMPRESSION: No pelvic fracture. No left hip fracture or dislocation. Electronically Signed   By: Ilona Sorrel M.D.   On: 03/20/2019 13:15    Discharge Exam: Vitals:   04/04/19 0509 04/04/19 1247  BP: (!) 122/47 (!) 130/57  Pulse: (!) 57 78  Resp: 17 19  Temp: 98.2 F (36.8 C) 97.8 F (36.6 C)  SpO2: 98% 100%   Vitals:   04/03/19 1315 04/03/19 2211 04/04/19 0509 04/04/19 1247  BP: (!) 114/99 115/62 (!) 122/47 (!) 130/57  Pulse: 77 83 (!) 57 78  Resp: 18 18 17 19   Temp: 98.6 F (37 C) 98.2 F (36.8 C) 98.2 F (36.8 C) 97.8 F (36.6 C)  TempSrc: Oral Oral Oral Oral  SpO2:  100% 94% 98% 100%  Weight:      Height:        General: Pt is alert, awake, not in acute distress Cardiovascular: RRR, S1/S2 +, no rubs, no gallops Respiratory: CTA bilaterally, no wheezing, no rhonchi Abdominal: Soft, NT, ND, bowel sounds + Extremities: no edema, no cyanosis, minimal erythema noted to anterior shin of the left lower extremity which is much improved.  No drainage, tenderness, or warmth.    The results of significant diagnostics from this hospitalization (including imaging, microbiology, ancillary and laboratory) are listed below for reference.     Microbiology: Recent Results (from the past 240 hour(s))  Urine culture     Status: Abnormal   Collection Time: 03/29/19  2:35 PM   Specimen: Urine, Catheterized  Result Value Ref Range Status   Specimen Description   Final    URINE, CATHETERIZED Performed at [facility], [address], [place], [place] [address]    Special Requests   Final    NONE Performed at [facility], [address] [place], [place] [address]    Culture MULTIPLE SPECIES PRESENT, SUGGEST RECOLLECTION (A)  Final   Report Status 03/30/2019 FINAL  Final  SARS CORONAVIRUS 2 (TAT 6-24 HRS) Nasopharyngeal Nasopharyngeal Swab     Status: None   Collection Time: 03/29/19  2:40 PM   Specimen: Nasopharyngeal Swab  Result Value Ref Range Status   SARS Coronavirus 2 NEGATIVE NEGATIVE Final    Comment: (NOTE) SARS-CoV-2 target nucleic acids are NOT DETECTED. The SARS-CoV-2 RNA is generally detectable in upper and lower respiratory specimens during the acute phase of infection. Negative results do not preclude SARS-CoV-2 infection, do not rule out co-infections with other pathogens, and should not be used as the sole basis for treatment or other patient management decisions. Negative results must be combined with clinical observations, patient history, and epidemiological information. The expected result is Negative. Fact Sheet for  Patients: SugarRoll.be Fact Sheet for Healthcare Providers: https://www.woods-mathews.com/ This test is not yet approved or cleared by the [place] FDA and  has been authorized for detection and/or diagnosis of SARS-CoV-2 by FDA under an Emergency Use Authorization (EUA). This EUA will remain  in effect (meaning this test can be used) for the duration of the COVID-19 declaration under Section 56 4(b)(1) of the Act, 21 U.S.C. section 360bbb-3(b)(1), unless the authorization is terminated or revoked sooner. Performed at [facility], [place] [address] [place], [place] [address]   Blood Cultures (routine x 2)     Status: None   Collection Time: 03/29/19  2:48 PM   Specimen: BLOOD LEFT ARM  Result Value Ref Range Status   Specimen Description BLOOD LEFT ARM  Final   Special Requests   Final    BOTTLES DRAWN AEROBIC AND ANAEROBIC Blood Culture adequate volume   Culture   Final    NO GROWTH 6 DAYS Performed at [facility], [address], [place], [place] [address]    Report Status 04/04/2019 FINAL  Final  Blood Cultures (routine x 2)     Status: None   Collection Time: 03/29/19  2:48 PM   Specimen: BLOOD RIGHT ARM  Result Value Ref Range Status   Specimen Description BLOOD RIGHT ARM  Final   Special Requests   Final    BOTTLES DRAWN AEROBIC AND ANAEROBIC Blood Culture adequate volume   Culture   Final    NO GROWTH 6 DAYS Performed at [facility], [address] [place], [place] [address]    Report Status 04/04/2019 FINAL  Final  MRSA PCR Screening     Status: None   Collection Time: 03/30/19  6:21 AM   Specimen: Nasopharyngeal  Result Value Ref Range Status   MRSA by PCR NEGATIVE NEGATIVE Final    Comment:        The GeneXpert MRSA Assay (FDA approved for NASAL specimens only), is one component of a comprehensive MRSA colonization surveillance program. It is not intended to diagnose MRSA infection nor to guide or monitor  treatment for MRSA infections. Performed at [facility], [address], [place], [place] [address]      Labs: BNP (last 3 results) No results for input(s): BNP in the last 8760 hours. Basic Metabolic Panel: Recent Labs  Lab 03/29/19 1448 03/30/19 0413 04/01/19 0452 04/04/19 0441  NA 138 139  --   --   K 4.0 4.0  --   --   CL 103 104  --   --   CO2 27 25  --   --   GLUCOSE 103* 100*  --   --   BUN 26* 21  --   --   CREATININE 0.97 0.80 0.85 0.83  CALCIUM 9.4 9.0  --   --    Liver Function Tests: Recent Labs  Lab 03/29/19 1448  AST 25  ALT 45*  ALKPHOS 161*  BILITOT 0.3  PROT 7.5  ALBUMIN 4.2   No results for input(s): LIPASE, AMYLASE in the last 168 hours. Recent Labs  Lab 03/29/19 1448  AMMONIA 15   CBC: Recent Labs  Lab 03/29/19 1448  WBC 6.1  NEUTROABS 3.1  HGB 14.1  HCT 42.7  MCV 96.6  PLT 258   Cardiac Enzymes: No results for input(s): CKTOTAL, CKMB, CKMBINDEX, TROPONINI in the last 168 hours. BNP: Invalid input(s): POCBNP CBG: Recent Labs  Lab 03/29/19 1515  GLUCAP 87   D-Dimer No results for input(s): DDIMER in the last 72 hours. Hgb A1c No results for input(s): HGBA1C in the last 72 hours. Lipid Profile No results for input(s): CHOL, HDL, LDLCALC, TRIG, CHOLHDL, LDLDIRECT in the last 72 hours. Thyroid function studies No results for input(s): TSH, T4TOTAL, T3FREE, THYROIDAB in the last 72 hours.  Invalid input(s): FREET3 Anemia work up No results for input(s): VITAMINB12, FOLATE, FERRITIN, TIBC, IRON, RETICCTPCT in the last 72 hours. Urinalysis    Component Value Date/Time   COLORURINE YELLOW 03/29/2019 1433   APPEARANCEUR CLEAR 03/29/2019 1433   LABSPEC 1.018 03/29/2019 1433   PHURINE 5.0 03/29/2019 1433   GLUCOSEU NEGATIVE 03/29/2019 1433   HGBUR NEGATIVE 03/29/2019 1433   BILIRUBINUR NEGATIVE 03/29/2019 1433   KETONESUR NEGATIVE 03/29/2019 1433   PROTEINUR  NEGATIVE 03/29/2019 1433   UROBILINOGEN 0.2 01/03/2013 2125    NITRITE NEGATIVE 03/29/2019 1433   LEUKOCYTESUR NEGATIVE 03/29/2019 1433   Sepsis Labs Invalid input(s): PROCALCITONIN,  WBC,  LACTICIDVEN Microbiology Recent Results (from the past 240 hour(s))  Urine culture     Status: Abnormal   Collection Time: 03/29/19  2:35 PM   Specimen: Urine, Catheterized  Result Value Ref Range Status   Specimen Description   Final    URINE, CATHETERIZED Performed at [facility], [address] [place], [place] [address]    Special Requests   Final    NONE Performed at [facility], [address] [place], [place] [address]    Culture MULTIPLE SPECIES PRESENT, SUGGEST RECOLLECTION (A)  Final   Report Status 03/30/2019 FINAL  Final  SARS CORONAVIRUS 2 (TAT 6-24 HRS) Nasopharyngeal Nasopharyngeal Swab     Status: None   Collection Time: 03/29/19  2:40 PM   Specimen: Nasopharyngeal Swab  Result Value Ref Range Status   SARS Coronavirus 2 NEGATIVE NEGATIVE Final    Comment: (NOTE) SARS-CoV-2 target nucleic acids are NOT DETECTED. The SARS-CoV-2 RNA is generally detectable in upper and lower respiratory specimens during the acute phase of infection. Negative results do not preclude SARS-CoV-2 infection, do not rule out co-infections with other pathogens, and should not be used as the sole basis for treatment or other patient management decisions. Negative results must be combined with clinical observations, patient history, and epidemiological information. The expected result is Negative. Fact Sheet for Patients: SugarRoll.be Fact Sheet for Healthcare Providers: https://www.woods-mathews.com/ This test is not yet approved or cleared by the [place] FDA and  has been authorized for detection and/or diagnosis of SARS-CoV-2 by FDA under an Emergency Use Authorization (EUA). This EUA will remain  in effect (meaning this test can be used) for the duration of the COVID-19 declaration under Section 56 4(b)(1)  of the Act, 21 U.S.C. section 360bbb-3(b)(1), unless the authorization is terminated or revoked sooner. Performed at [facility], [place] [address] [place], [place] [address]   Blood Cultures (routine x 2)     Status: None   Collection Time: 03/29/19  2:48 PM   Specimen: BLOOD LEFT ARM  Result Value Ref Range Status   Specimen Description BLOOD LEFT ARM  Final   Special Requests   Final    BOTTLES DRAWN AEROBIC AND ANAEROBIC Blood Culture adequate volume   Culture   Final    NO GROWTH 6 DAYS Performed at [facility], [address] [place], [place] [address]    Report Status 04/04/2019 FINAL  Final  Blood Cultures (routine x 2)     Status: None   Collection Time: 03/29/19  2:48 PM   Specimen: BLOOD RIGHT ARM  Result Value Ref Range Status   Specimen Description BLOOD RIGHT ARM  Final   Special Requests   Final    BOTTLES DRAWN AEROBIC AND ANAEROBIC Blood Culture adequate volume   Culture   Final    NO GROWTH 6 DAYS Performed at [facility], [address] [place],  [address]    Report Status 04/04/2019 FINAL  Final  MRSA PCR Screening     Status: None   Collection Time: 03/30/19  6:21 AM   Specimen: Nasopharyngeal  Result Value Ref Range Status   MRSA by PCR NEGATIVE NEGATIVE Final    Comment:        The GeneXpert MRSA Assay (FDA approved for NASAL specimens only), is one component  of a comprehensive MRSA colonization surveillance program. It is not intended to diagnose MRSA infection nor to guide or monitor treatment for MRSA infections. Performed at [facility], [address], [place], [place] [address]      Time coordinating discharge: 35 minutes  SIGNED:   Rodena Goldmann, DO [facility] 04/04/2019, 1:11 PM  If 7PM-7AM, please contact night-coverage [email or web address]

## 2019-04-04 NOTE — Evaluation (Signed)
Occupational Therapy Evaluation Patient Details Name: LAQUESHIA REISH MRN: [id number] DOB: 07/24/1945 Today's Date: 04/04/2019    History of Present Illness RECIA KARLOVICH is an 73 y.o. 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  Extensive work-up was essentially normal except for marked stool volume which was likely cause of abdominal pain.  As patient was getting ready to be discharged however patient noted that in addition to right arm pain she had had an episode where she could not use her right upper extremity.  This led to referral to [facility] for [facility] for TIA work-up.   Clinical Impression   Pt requesting to use bedpan upon therapy arrival however, patient was agreeable to transfer to [facility] and attempt toileting. Pt demonstrates decreased dynamic standing balance during functional activities and required additional assistance to complete basic ADL tasks. Pt reports that the ALF staff typically help her with basic ADL tasks such as bathing, transfers, grooming, and dressing. She does not voice any concerns with returning back to ALF versus SNF. Recommend that in-house OT evaluate patient once she returns to ALF to determine any additional OT services are needed. Possible discharge for today so OT will sign off.     Follow Up Recommendations  Supervision - Intermittent;Home health OT    Equipment Recommendations  None recommended by OT       Precautions / Restrictions Precautions Precautions: Fall Restrictions Weight Bearing Restrictions: No      Mobility    Transfers Overall transfer level: Needs assistance Equipment used: Rolling walker (2 wheeled) Transfers: Sit to/from Stand Sit to Stand: Min  assist Stand pivot transfers: Min assist            Balance Overall balance assessment: Needs assistance Sitting-balance support: Feet supported Sitting balance-Leahy Scale: Fair     Standing balance support: Bilateral upper extremity supported;During functional activity Standing balance-Leahy Scale: Poor Standing balance comment: standin with RW while transferring to and from [facility]. Posterior learn demonstrated several times during transfer.      ADL either performed or assessed with clinical judgement   ADL Overall ADL's : At baseline Eating/Feeding: Set up;Sitting   Grooming: Wash/dry hands;Wash/dry face;Set up;Sitting   Upper Body Bathing: Moderate assistance;Sitting   Lower Body Bathing: Total assistance;Sit to/from stand;Sitting/lateral leans   Upper Body Dressing : Minimal assistance;Sitting   Lower Body Dressing: Total assistance;Sitting/lateral leans   Toilet Transfer: Minimal assistance;Ambulation;BSC;RW   Toileting- Clothing Manipulation and Hygiene: Total assistance;Sit to/from stand       Functional mobility during ADLs: Minimal assistance;Rolling walker;Cueing for sequencing       Vision Baseline Vision/History: Wears glasses Wears Glasses: At all times Patient Visual Report: No change from baseline              Pertinent Vitals/Pain Pain Assessment: No/denies pain     Hand Dominance Right   Extremity/Trunk Assessment Upper Extremity Assessment Upper Extremity Assessment: Overall WFL for tasks assessed   Lower Extremity Assessment Lower Extremity Assessment: Defer to PT evaluation       Communication Communication Communication: Expressive difficulties   Cognition Arousal/Alertness: Awake/alert Behavior During Therapy: WFL for tasks assessed/performed Overall Cognitive Status: Within Functional Limits for tasks assessed                  Home Living  Family/patient expects to be discharged to:: Assisted living     Home  Equipment: Walker - 4 wheels;Wheelchair - manual   Additional Comments: Patient states she has her own wheelchair and walker, but that equipment needed for bathing and toiletting is available to assisted living facility.      Prior Functioning/Environment Level of Independence: Needs assistance  Gait / Transfers Assistance Needed: unable to ambulate without assist at living facility ADL's / Homemaking Assistance Needed: Assist from staff at living facility            OT Problem List: Decreased activity tolerance;Impaired balance (sitting and/or standing)      OT Treatment/Interventions:      OT Goals(Current goals can be found in the care plan section) Acute Rehab OT Goals Patient Stated Goal: Return to assisted living facility                AM-PAC OT "6 Clicks" Daily Activity     Outcome Measure Help from another person eating meals?: None Help from another person taking care of personal grooming?: A Little Help from another person toileting, which includes using toliet, bedpan, or urinal?: A Lot Help from another person bathing (including washing, rinsing, drying)?: A Lot Help from another person to put on and taking off regular upper body clothing?: A Lot Help from another person to put on and taking off regular lower body clothing?: A Lot 6 Click Score: 15   End of Session Equipment Utilized During Treatment: Rolling walker  Activity Tolerance: Patient tolerated treatment well Patient left: in chair;with call bell/phone within reach;with chair alarm set  OT Visit Diagnosis: History of falling (Z91.81);Muscle weakness (generalized) (M62.81)                Time: [id number] OT Time Calculation (min): 18 min Charges:  OT General Charges $OT Visit: 1 Visit OT Evaluation $OT Eval Low Complexity: [place], OTR/L,CBIS  [phone]   , [name] 04/04/2019, 9:09 AM

## 2019-05-02 ENCOUNTER — Emergency Department (HOSPITAL_COMMUNITY)
Admission: EM | Admit: 2019-05-02 | Discharge: 2019-05-02 | Disposition: A | Discharge status: Home / Self Care | Payer: Medicare Other | Attending: Emergency Medicine | Admitting: Emergency Medicine

## 2019-05-02 ENCOUNTER — Other Ambulatory Visit: Payer: Self-pay

## 2019-05-02 ENCOUNTER — Emergency Department (HOSPITAL_COMMUNITY): Payer: Medicare Other

## 2019-05-02 ENCOUNTER — Encounter (HOSPITAL_COMMUNITY): Payer: Self-pay | Admitting: Emergency Medicine

## 2019-05-02 DIAGNOSIS — M7918 Myalgia, other site: Secondary | ICD-10-CM | POA: Diagnosis not present

## 2019-05-02 DIAGNOSIS — Z87891 Personal history of nicotine dependence: Secondary | ICD-10-CM | POA: Diagnosis not present

## 2019-05-02 DIAGNOSIS — Z79899 Other long term (current) drug therapy: Secondary | ICD-10-CM | POA: Insufficient documentation

## 2019-05-02 DIAGNOSIS — I10 Essential (primary) hypertension: Secondary | ICD-10-CM | POA: Insufficient documentation

## 2019-05-02 DIAGNOSIS — G20 Parkinson's disease: Secondary | ICD-10-CM | POA: Diagnosis not present

## 2019-05-02 DIAGNOSIS — U071 COVID-19: Secondary | ICD-10-CM | POA: Insufficient documentation

## 2019-05-02 DIAGNOSIS — Z7902 Long term (current) use of antithrombotics/antiplatelets: Secondary | ICD-10-CM | POA: Insufficient documentation

## 2019-05-02 DIAGNOSIS — R519 Headache, unspecified: Secondary | ICD-10-CM | POA: Diagnosis present

## 2019-05-02 LAB — CBC WITH DIFFERENTIAL/PLATELET
Abs Immature Granulocytes: 0.02 10*3/uL (ref 0.00–0.07)
Basophils Absolute: 0 10*3/uL (ref 0.0–0.1)
Basophils Relative: 0 %
Eosinophils Absolute: 0.1 10*3/uL (ref 0.0–0.5)
Eosinophils Relative: 1 %
HCT: 42.6 % (ref 36.0–46.0)
Hemoglobin: 14 g/dL (ref 12.0–15.0)
Immature Granulocytes: 0 %
Lymphocytes Relative: 17 %
Lymphs Abs: 1.1 10*3/uL (ref 0.7–4.0)
MCH: 31.9 pg (ref 26.0–34.0)
MCHC: 32.9 g/dL (ref 30.0–36.0)
MCV: 97 fL (ref 80.0–100.0)
Monocytes Absolute: 0.6 10*3/uL (ref 0.1–1.0)
Monocytes Relative: 10 %
Neutro Abs: 4.5 10*3/uL (ref 1.7–7.7)
Neutrophils Relative %: 72 %
Platelets: 214 10*3/uL (ref 150–400)
RBC: 4.39 MIL/uL (ref 3.87–5.11)
RDW: 12.2 % (ref 11.5–15.5)
WBC: 6.3 10*3/uL (ref 4.0–10.5)
nRBC: 0 % (ref 0.0–0.2)

## 2019-05-02 LAB — COMPREHENSIVE METABOLIC PANEL
ALT: 19 U/L (ref 0–44)
AST: 40 U/L (ref 15–41)
Albumin: 3.6 g/dL (ref 3.5–5.0)
Alkaline Phosphatase: 122 U/L (ref 38–126)
Anion gap: 9 (ref 5–15)
BUN: 20 mg/dL (ref 8–23)
CO2: 25 mmol/L (ref 22–32)
Calcium: 8.8 mg/dL — ABNORMAL LOW (ref 8.9–10.3)
Chloride: 102 mmol/L (ref 98–111)
Creatinine, Ser: 0.67 mg/dL (ref 0.44–1.00)
GFR calc Af Amer: >60 mL/min (ref >60)
GFR calc non Af Amer: >60 mL/min (ref >60)
Glucose, Bld: 112 mg/dL — ABNORMAL HIGH (ref 70–99)
Potassium: 3.8 mmol/L (ref 3.5–5.1)
Sodium: 136 mmol/L (ref 135–145)
Total Bilirubin: 0.6 mg/dL (ref 0.3–1.2)
Total Protein: 6.9 g/dL (ref 6.5–8.1)

## 2019-05-02 LAB — POC SARS CORONAVIRUS 2 AG -  ED: SARS Coronavirus 2 Ag: POSITIVE — AB

## 2019-05-02 MED ORDER — SODIUM CHLORIDE 0.9 % IV BOLUS
500.0000 mL | Freq: Once | INTRAVENOUS | Status: AC
  Administered 2019-05-02: 500 mL via INTRAVENOUS

## 2019-05-02 MED ORDER — ALBUTEROL SULFATE HFA 108 (90 BASE) MCG/ACT IN AERS
6.0000 | INHALATION_SPRAY | RESPIRATORY_TRACT | Status: DC | PRN
  Administered 2019-05-02: 6 via RESPIRATORY_TRACT
  Filled 2019-05-02: qty 6.7

## 2019-05-02 MED ORDER — ALBUTEROL SULFATE HFA 108 (90 BASE) MCG/ACT IN AERS: 2.0000 | INHALATION_SPRAY | Freq: Four times a day (QID) | RESPIRATORY_TRACT | 0 refills | Status: DC | PRN

## 2019-05-02 MED ORDER — KETOROLAC TROMETHAMINE 30 MG/ML IJ SOLN
15.0000 mg | Freq: Once | INTRAMUSCULAR | Status: AC
  Administered 2019-05-02: 15 mg via INTRAVENOUS
  Filled 2019-05-02: qty 1

## 2019-05-02 NOTE — ED Notes (Signed)
Pt's sister updated.  °

## 2019-05-02 NOTE — ED Notes (Signed)
Report called to Glenard Haring, Therapist, sports at [facility]. [facility] to transport pt back to facility.

## 2019-05-02 NOTE — ED Notes (Signed)
Pt is wheelchair bound at baseline.

## 2019-05-02 NOTE — ED Provider Notes (Signed)
[facility] EMERGENCY DEPARTMENT Provider Note   CSN: [id number] Arrival date & time: 05/02/19  1405     History   Chief Complaint Chief Complaint  Patient presents with  . Headache  . pain allover  . covid +    HPI Courtney Tucker is a 73 y.o. female.     Patient complains of fever and aches.  She is positive for Covid  The history is provided by the patient. No language interpreter was used.  Headache Pain location:  Generalized Quality:  Dull Radiates to:  Does not radiate Severity currently:  4/10 Severity at highest:  6/10 Onset quality:  Sudden Timing:  Intermittent Progression:  Waxing and waning Chronicity:  New Associated symptoms: no abdominal pain, no back pain, no congestion, no cough, no diarrhea, no fatigue, no seizures and no sinus pressure     Past Medical History:  Diagnosis Date  . Anxiety   . Arthritis   . Complication of anesthesia   . Depression   . Dysphasia   . GERD (gastroesophageal reflux disease)   . Hyperlipidemia   . Hypertension   . Parkinson's disease ([place])    diagnosed at age 56  . PONV (postoperative nausea and vomiting)    states it has been a long time since getting sick  . Reflux   . Sleep apnea    Has CPAP but doesnt use  . TIA (transient ischemic attack)    not really TIA but abnormal MRI per pt left sided weakness  . UTI (lower urinary tract infection)     Patient Active Problem List   Diagnosis Date Noted  . Syncope 03/30/2019  . TIA (transient ischemic attack) 03/29/2019  . 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 Parkinson's disease ([place]) 02/23/2015  . Encounter for screening colonoscopy 11/14/2013  . Dysphagia, unspecified(787.20) 11/14/2013  . Parkinson disease ([place]) 08/05/2013  . Depression, major 07/25/2013  . Anxiety 12/30/2012  . Paralysis agitans ([place]) 10/15/2012  . 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 Abnormality of gait 10/15/2012  . Dysphasia 10/15/2012  . HYPERLIPIDEMIA-MIXED  02/14/2009  . Essential hypertension 02/14/2009  . DYSPNEA 02/14/2009  . CHEST PAIN-UNSPECIFIED 02/14/2009    Past Surgical History:  Procedure Laterality Date  . BACK SURGERY    . Battery change     DBS, 12/2009  . BIOPSY N/A 11/29/2013   Procedure: GASTRIC BIOPSY;  Surgeon: Danie Binder, MD;  Location: [facility];  Service: Endoscopy;  Laterality: N/A;  . BURR HOLE W/ STEREOTACTIC INSERTION OF DBS LEADS / INTRAOP MICROELECTRODE RECORDING     2007  . CARPAL TUNNEL RELEASE Right   . COLONOSCOPY WITH PROPOFOL N/A 11/29/2013   Procedure: COLONOSCOPY WITH PROPOFOL;  Surgeon: Danie Binder, MD;  Location: [facility];  Service: Endoscopy;  Laterality: N/A;  entered cecum @ [phone]; total cecal withdrawal time 21 minutes   . ELBOW SURGERY Left    after fx  . ESOPHAGEAL DILATION N/A 11/29/2013   Procedure: ESOPHAGEAL DILATION;  Surgeon: Danie Binder, MD;  Location: [facility];  Service: Endoscopy;  Laterality: N/A;  [place] 14/15/16  . ESOPHAGOGASTRODUODENOSCOPY (EGD) WITH PROPOFOL N/A 11/29/2013   Procedure: ESOPHAGOGASTRODUODENOSCOPY (EGD) WITH PROPOFOL;  Surgeon: Danie Binder, MD;  Location: [facility];  Service: Endoscopy;  Laterality: N/A;  . FOOT SURGERY Left    "something with 2nd 2."  . NECK SURGERY     Fusion  . POLYPECTOMY N/A 11/29/2013   Procedure: POLYPECTOMY;  Surgeon: Danie Binder, MD;  Location:  [facility];  Service: Endoscopy;  Laterality: N/A;  Distal Transverse Colon x2   . PR ANALYZE NEUROSTIM BRAIN, FIRST 1H  10/15/2012      . PULSE GENERATOR IMPLANT Right 03/30/2018   Procedure: Right Implantable pulse generator change;  Surgeon: Erline Levine, MD;  Location: [place];  Service: Neurosurgery;  Laterality: Right;  . SUBTHALAMIC STIMULATOR BATTERY REPLACEMENT N/A 08/05/2013   Procedure: SUBTHALAMIC STIMULATOR BATTERY REPLACEMENT;  Surgeon: Erline Levine, MD;  Location: [place] [facility];  Service: Neurosurgery;  Laterality: N/A;  . SUBTHALAMIC STIMULATOR BATTERY REPLACEMENT N/A 11/26/2015   Procedure:  Implantable pulse generator battery change for deep brain stimulator;  Surgeon: Erline Levine, MD;  Location: [place] [facility];  Service: Neurosurgery;  Laterality: N/A;  . VAGINAL HYSTERECTOMY     "partial"  . WRIST SURGERY Right    after fx     OB History   No obstetric history on file.      Home Medications    Prior to Admission medications   Medication Sig Start Date End Date Taking? Authorizing Provider  acetaminophen (TYLENOL) 500 MG tablet Take 500 mg by mouth every 6 (six) hours as needed for mild pain or moderate pain.    [provider]  albuterol (VENTOLIN HFA) 108 (90 Base) MCG/ACT inhaler Inhale 2 puffs into the lungs every 6 (six) hours as needed for wheezing or shortness of breath. 05/02/19   Milton Ferguson, MD  amLODipine (NORVASC) 10 MG tablet Take 10 mg by mouth daily.     [provider]  Carbidopa-Levodopa ER (SINEMET CR) 25-100 MG tablet controlled release Take 1 tablet by mouth every morning. 04/04/19 05/04/19  Manuella Ghazi, Pratik D, DO  Cholecalciferol (VITAMIN D) 50 MCG (2000 UT) tablet Take 2,000 Units by mouth daily.    [provider]  clopidogrel (PLAVIX) 75 MG tablet Take 75 mg by mouth daily.    [provider]  DULoxetine (CYMBALTA) 60 MG capsule Take 60 mg by mouth 2 (two) times daily.     [provider]  losartan (COZAAR) 100 MG tablet Take 100 mg by mouth daily.    [provider]  omeprazole (PRILOSEC) 20 MG capsule Take 20 mg by mouth daily.    [provider]  oxybutynin (DITROPAN) 5 MG tablet Take 5 mg by mouth 2 (two) times daily.    [provider]  pramipexole (MIRAPEX) 1 MG tablet Take 1.5 mg by mouth at bedtime.  06/18/16   [provider]  traZODone (DESYREL) 100 MG tablet Take 100 mg by mouth at bedtime.    [provider]    Family History Family History  Problem Relation Age of Onset  . Colon cancer Paternal Uncle     Social History Social History   Tobacco  Use  . Smoking status: Former Smoker    Packs/day: 0.50    Years: 25.00    Pack years: 12.50    Types: Cigarettes    Quit date: 03/01/2019    Years since quitting: 0.1  . Smokeless tobacco: Never Used  Substance Use Topics  . Alcohol use: Yes    Comment: 1/2 glass wine rarely  . Drug use: No     Allergies   Oxycodone, Aspirin, and Codeine   Review of Systems Review of Systems  Constitutional: Negative for appetite change and fatigue.       Fever  HENT: Negative for congestion, ear discharge and sinus pressure.   Eyes: Negative for discharge.  Respiratory: Negative for cough.  Cardiovascular: Negative for chest pain.  Gastrointestinal: Negative for abdominal pain and diarrhea.  Genitourinary: Negative for frequency and hematuria.  Musculoskeletal: Negative for back pain.  Skin: Negative for rash.  Neurological: Positive for headaches. Negative for seizures.  Psychiatric/Behavioral: Negative for hallucinations.     Physical Exam Updated Vital Signs BP 124/66   Pulse 64   Temp 97.8 F (36.6 C) (Oral)   Resp (!) 24   Ht 5\' 2"  (1.575 m)   Wt 67.1 kg   SpO2 92%   BMI 27.06 kg/m   Physical Exam Vitals signs and nursing note reviewed.  Constitutional:      Appearance: She is well-developed.  HENT:     Head: Normocephalic.  Eyes:     General: No scleral icterus.    Conjunctiva/sclera: Conjunctivae normal.  Neck:     Musculoskeletal: Neck supple.     Thyroid: No thyromegaly.  Cardiovascular:     Rate and Rhythm: Normal rate and regular rhythm.     Heart sounds: No murmur. No friction rub. No gallop.   Pulmonary:     Breath sounds: No stridor. No wheezing or rales.  Chest:     Chest wall: No tenderness.  Abdominal:     General: There is no distension.     Tenderness: There is no abdominal tenderness. There is no rebound.  Musculoskeletal: Normal range of motion.  Lymphadenopathy:     Cervical: No cervical adenopathy.  Skin:    Findings: No erythema or  rash.  Neurological:     Mental Status: She is alert and oriented to person, place, and time.     Motor: No abnormal muscle tone.     Coordination: Coordination normal.  Psychiatric:        Behavior: Behavior normal.      ED Treatments / Results  Labs (all labs ordered are listed, but only abnormal results are displayed) Labs Reviewed  COMPREHENSIVE METABOLIC PANEL - Abnormal; Notable for the following components:      Result Value   Glucose, Bld 112 (*)    Calcium 8.8 (*)    All other components within normal limits  POC SARS CORONAVIRUS 2 AG -  ED - Abnormal; Notable for the following components:   SARS Coronavirus 2 Ag POSITIVE (*)    All other components within normal limits  CBC WITH DIFFERENTIAL/PLATELET    EKG None  Radiology Dg Chest Portable 1 View  Result Date: 05/02/2019 CLINICAL DATA:  Cough and fever EXAM: PORTABLE CHEST 1 VIEW COMPARISON:  March 29 2019 FINDINGS: Generator for deep brain stimulator obscures the right lower lung. Low lung volumes. Mild interstitial prominence. No consolidation or pleural effusion. No pneumothorax. Stable cardiomediastinal contours. Cervicothoracic fixation hardware. IMPRESSION: Mild interstitial prominence, which could reflect atypical pneumonia. Electronically Signed   By: Macy Mis M.D.   On: 05/02/2019 15:07    Procedures Procedures (including critical care time)  Medications Ordered in ED Medications  albuterol (VENTOLIN HFA) 108 (90 Base) MCG/ACT inhaler 6 puff (6 puffs Inhalation Given 05/02/19 1602)  sodium chloride 0.9 % bolus 500 mL (0 mLs Intravenous Stopped 05/02/19 1751)  ketorolac (TORADOL) 30 MG/ML injection 15 mg (15 mg Intravenous Given 05/02/19 1602)     Initial Impression / Assessment and Plan / ED Course  I have reviewed the triage vital signs and the nursing notes.  Pertinent labs & imaging results that were available during my care of the patient were reviewed by me and considered in my medical  decision  making (see chart for details).        Labs unremarkable still positive Covid.  Patient is nontoxic and not hypoxic.  She will be sent home with albuterol inhaler and will follow-up with her doctor Final Clinical Impressions(s) / ED Diagnoses   Final diagnoses:  COVID-19    ED Discharge Orders         Ordered    albuterol (VENTOLIN HFA) 108 (90 Base) MCG/ACT inhaler  Every 6 hours PRN     05/02/19 2142           Milton Ferguson, MD 05/02/19 2145

## 2019-05-02 NOTE — Discharge Instructions (Signed)
Tylenol for pain plenty of fluids.  Use your albuterol inhaler as needed for shortness of breath

## 2019-05-06 ENCOUNTER — Encounter (HOSPITAL_COMMUNITY): Payer: Self-pay

## 2019-05-06 ENCOUNTER — Other Ambulatory Visit: Payer: Self-pay

## 2019-05-06 ENCOUNTER — Emergency Department (HOSPITAL_COMMUNITY): Payer: Medicare Other

## 2019-05-06 ENCOUNTER — Emergency Department (HOSPITAL_COMMUNITY)
Admission: EM | Admit: 2019-05-06 | Discharge: 2019-05-06 | Disposition: A | Discharge status: Home / Self Care | Payer: Medicare Other | Attending: Emergency Medicine | Admitting: Emergency Medicine

## 2019-05-06 DIAGNOSIS — G20 Parkinson's disease: Secondary | ICD-10-CM | POA: Insufficient documentation

## 2019-05-06 DIAGNOSIS — I10 Essential (primary) hypertension: Secondary | ICD-10-CM | POA: Diagnosis not present

## 2019-05-06 DIAGNOSIS — U071 COVID-19: Secondary | ICD-10-CM | POA: Insufficient documentation

## 2019-05-06 DIAGNOSIS — R52 Pain, unspecified: Secondary | ICD-10-CM

## 2019-05-06 DIAGNOSIS — Z8673 Personal history of transient ischemic attack (TIA), and cerebral infarction without residual deficits: Secondary | ICD-10-CM | POA: Insufficient documentation

## 2019-05-06 DIAGNOSIS — Z87891 Personal history of nicotine dependence: Secondary | ICD-10-CM | POA: Diagnosis not present

## 2019-05-06 LAB — COMPREHENSIVE METABOLIC PANEL
ALT: 13 U/L (ref 0–44)
AST: 23 U/L (ref 15–41)
Albumin: 3.7 g/dL (ref 3.5–5.0)
Alkaline Phosphatase: 128 U/L — ABNORMAL HIGH (ref 38–126)
Anion gap: 11 (ref 5–15)
BUN: 16 mg/dL (ref 8–23)
CO2: 25 mmol/L (ref 22–32)
Calcium: 9 mg/dL (ref 8.9–10.3)
Chloride: 101 mmol/L (ref 98–111)
Creatinine, Ser: 0.67 mg/dL (ref 0.44–1.00)
GFR calc Af Amer: >60 mL/min (ref >60)
GFR calc non Af Amer: >60 mL/min (ref >60)
Glucose, Bld: 105 mg/dL — ABNORMAL HIGH (ref 70–99)
Potassium: 4 mmol/L (ref 3.5–5.1)
Sodium: 137 mmol/L (ref 135–145)
Total Bilirubin: 0.3 mg/dL (ref 0.3–1.2)
Total Protein: 7.4 g/dL (ref 6.5–8.1)

## 2019-05-06 LAB — LACTATE DEHYDROGENASE: LDH: 156 U/L (ref 98–192)

## 2019-05-06 LAB — TRIGLYCERIDES: Triglycerides: 151 mg/dL — ABNORMAL HIGH (ref <150)

## 2019-05-06 LAB — CBC WITH DIFFERENTIAL/PLATELET
Abs Immature Granulocytes: 0.01 10*3/uL (ref 0.00–0.07)
Basophils Absolute: 0 10*3/uL (ref 0.0–0.1)
Basophils Relative: 1 %
Eosinophils Absolute: 0 10*3/uL (ref 0.0–0.5)
Eosinophils Relative: 1 %
HCT: 43.1 % (ref 36.0–46.0)
Hemoglobin: 14.7 g/dL (ref 12.0–15.0)
Immature Granulocytes: 0 %
Lymphocytes Relative: 33 %
Lymphs Abs: 1.3 10*3/uL (ref 0.7–4.0)
MCH: 32.3 pg (ref 26.0–34.0)
MCHC: 34.1 g/dL (ref 30.0–36.0)
MCV: 94.7 fL (ref 80.0–100.0)
Monocytes Absolute: 0.4 10*3/uL (ref 0.1–1.0)
Monocytes Relative: 11 %
Neutro Abs: 2.2 10*3/uL (ref 1.7–7.7)
Neutrophils Relative %: 54 %
Platelets: 182 10*3/uL (ref 150–400)
RBC: 4.55 MIL/uL (ref 3.87–5.11)
RDW: 11.9 % (ref 11.5–15.5)
WBC: 4 10*3/uL (ref 4.0–10.5)
nRBC: 0 % (ref 0.0–0.2)

## 2019-05-06 LAB — C-REACTIVE PROTEIN: CRP: 1 mg/dL — ABNORMAL HIGH (ref <1.0)

## 2019-05-06 LAB — FERRITIN: Ferritin: 63 ng/mL (ref 11–307)

## 2019-05-06 LAB — LACTIC ACID, PLASMA
Lactic Acid, Venous: 1.1 mmol/L (ref 0.5–1.9)
Lactic Acid, Venous: 0.8 mmol/L (ref 0.5–1.9)

## 2019-05-06 LAB — D-DIMER, QUANTITATIVE: D-Dimer, Quant: 0.36 ug/mL-FEU (ref 0.00–0.50)

## 2019-05-06 LAB — FIBRINOGEN: Fibrinogen: 443 mg/dL (ref 210–475)

## 2019-05-06 LAB — PROCALCITONIN: Procalcitonin: <0.1 ng/mL

## 2019-05-06 MED ORDER — ACETAMINOPHEN 500 MG PO TABS
500.0000 mg | ORAL_TABLET | Freq: Once | ORAL | Status: AC
  Administered 2019-05-06: 500 mg via ORAL
  Filled 2019-05-06: qty 1

## 2019-05-06 NOTE — Discharge Instructions (Signed)
You were seen in the emergency department today with symptoms related to your COVID-19 diagnosis.  You are not requiring supplemental oxygen at this time.  Please continue your home medications and return to the emergency department with any shortness of breath, low oxygen levels, chest pain, or other severe symptoms.

## 2019-05-06 NOTE — ED Notes (Signed)
Pt has a brain stimulator located mid chest. EKG has artifact.

## 2019-05-06 NOTE — ED Provider Notes (Signed)
Emergency Department Provider Note   I have reviewed the triage vital signs and the nursing notes.   HISTORY  Chief Complaint Generalized Body Aches   HPI Courtney Tucker is a 73 y.o. female with past medical history reviewed below presents to the emergency department [facility].  Patient is known to be Covid positive with positive test in our system from 12/7.  According to EMS, the patient asked to be transported due to "pain all over" and weakness.  Level 5 caveat applies with patient's dysarthria and Parkinson's Disease.  Patient does deny chest pain, shortness of breath, vomiting, or diarrhea.   Past Medical History:  Diagnosis Date  . Anxiety   . Arthritis   . Complication of anesthesia   . Depression   . Dysphasia   . GERD (gastroesophageal reflux disease)   . Hyperlipidemia   . Hypertension   . Parkinson's disease ([place])    diagnosed at age 66  . PONV (postoperative nausea and vomiting)    states it has been a [place] time since getting sick  . Reflux   . Sleep apnea    Has CPAP but doesnt use  . TIA (transient ischemic attack)    not really TIA but abnormal MRI per pt left sided weakness  . UTI (lower urinary tract infection)     Patient Active Problem List   Diagnosis Date Noted  . Syncope 03/30/2019  . TIA (transient ischemic attack) 03/29/2019  . 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 Parkinson's disease ([place]) 02/23/2015  . Encounter for screening colonoscopy 11/14/2013  . Dysphagia, unspecified(787.20) 11/14/2013  . Parkinson disease ([place]) 08/05/2013  . Depression, major 07/25/2013  . Anxiety 12/30/2012  . Paralysis agitans ([place]) 10/15/2012  . 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 Abnormality of gait 10/15/2012  . Dysphasia 10/15/2012  . HYPERLIPIDEMIA-MIXED 02/14/2009  . Essential hypertension 02/14/2009  . DYSPNEA 02/14/2009  . CHEST PAIN-UNSPECIFIED 02/14/2009    Past Surgical History:  Procedure Laterality  Date  . BACK SURGERY    . Battery change     DBS, 12/2009  . BIOPSY N/A 11/29/2013   Procedure: GASTRIC BIOPSY;  Surgeon: Danie Binder, MD;  Location: [facility];  Service: Endoscopy;  Laterality: N/A;  . BURR HOLE W/ STEREOTACTIC INSERTION OF DBS LEADS / INTRAOP MICROELECTRODE RECORDING     2007  . CARPAL TUNNEL RELEASE Right   . COLONOSCOPY WITH PROPOFOL N/A 11/29/2013   Procedure: COLONOSCOPY WITH PROPOFOL;  Surgeon: Danie Binder, MD;  Location: [facility];  Service: Endoscopy;  Laterality: N/A;  entered cecum @ [phone]; total cecal withdrawal time 21 minutes   . ELBOW SURGERY Left    after fx  . ESOPHAGEAL DILATION N/A 11/29/2013   Procedure: ESOPHAGEAL DILATION;  Surgeon: Danie Binder, MD;  Location: [facility];  Service: Endoscopy;  Laterality: N/A;  [place] 14/15/16  . ESOPHAGOGASTRODUODENOSCOPY (EGD) WITH PROPOFOL N/A 11/29/2013   Procedure: ESOPHAGOGASTRODUODENOSCOPY (EGD) WITH PROPOFOL;  Surgeon: Danie Binder, MD;  Location: [facility];  Service: Endoscopy;  Laterality: N/A;  . FOOT SURGERY Left    "something with 2nd 2."  . NECK SURGERY     Fusion  . POLYPECTOMY N/A 11/29/2013   Procedure: POLYPECTOMY;  Surgeon: Danie Binder, MD;  Location: [facility];  Service: Endoscopy;  Laterality: N/A;  Distal Transverse Colon x2   . PR ANALYZE NEUROSTIM BRAIN, FIRST 1H  10/15/2012      . PULSE GENERATOR IMPLANT Right 03/30/2018  Procedure: Right Implantable pulse generator change;  Surgeon: Erline Levine, MD;  Location: [place];  Service: Neurosurgery;  Laterality: Right;  . SUBTHALAMIC STIMULATOR BATTERY REPLACEMENT N/A 08/05/2013   Procedure: SUBTHALAMIC STIMULATOR BATTERY REPLACEMENT;  Surgeon: Erline Levine, MD;  Location: [place] [facility];  Service: Neurosurgery;  Laterality: N/A;  . SUBTHALAMIC STIMULATOR BATTERY REPLACEMENT N/A 11/26/2015   Procedure: Implantable pulse generator battery change for deep brain stimulator;  Surgeon: Erline Levine, MD;  Location: [place] [facility];  Service: Neurosurgery;  Laterality: N/A;    . VAGINAL HYSTERECTOMY     "partial"  . WRIST SURGERY Right    after fx    Allergies Oxycodone, Aspirin, and Codeine  Family History  Problem Relation Age of Onset  . Colon cancer Paternal Uncle     Social History Social History   Tobacco Use  . Smoking status: Former Smoker    Packs/day: 0.50    Years: 25.00    Pack years: 12.50    Types: Cigarettes    Quit date: 03/01/2019    Years since quitting: 0.1  . Smokeless tobacco: Never Used  Substance Use Topics  . Alcohol use: Yes    Comment: 1/2 glass wine rarely  . Drug use: No    Review of Systems  Constitutional: Positive fever and body aches.  Eyes: No visual changes. ENT: No sore throat. Cardiovascular: Denies chest pain. Respiratory: Denies shortness of breath. Gastrointestinal: No abdominal pain.  No nausea, no vomiting.  No diarrhea.  No constipation. Genitourinary: Negative for dysuria. Musculoskeletal: Negative for back pain. Skin: Negative for rash. Neurological: Negative for headaches, focal weakness or numbness.  10-point ROS otherwise negative.  ____________________________________________   PHYSICAL EXAM:  VITAL SIGNS: ED Triage Vitals  Enc Vitals Group     BP 05/06/19 1509 (!) 147/80     Pulse Rate 05/06/19 1509 77     Resp 05/06/19 1509 (!) 22     Temp 05/06/19 1509 99.6 F (37.6 C)     Temp Source 05/06/19 1509 Oral     SpO2 05/06/19 1509 93 %     Weight 05/06/19 1510 147 lb 11.3 oz (67 kg)   Constitutional: Alert and able to provide a brief history.  Eyes: Conjunctivae are normal.  Head: Atraumatic. Nose: No congestion/rhinnorhea. Mouth/Throat: Mucous membranes are moist.   Neck: No stridor.   Cardiovascular: Normal rate, regular rhythm. Good peripheral circulation. Grossly normal heart sounds.   Respiratory: Normal respiratory effort. No retractions. Lungs CTAB. Gastrointestinal: No distention.  Musculoskeletal: No gross deformities of extremities. Neurologic: Baseline  dysarthria. Moving all extremities.  Skin:  Skin is warm, dry and intact. No rash noted.  ____________________________________________   LABS (all labs ordered are listed, but only abnormal results are displayed)  Labs Reviewed  COMPREHENSIVE METABOLIC PANEL - Abnormal; Notable for the following components:      Result Value   Glucose, Bld 105 (*)    Alkaline Phosphatase 128 (*)    All other components within normal limits  TRIGLYCERIDES - Abnormal; Notable for the following components:   Triglycerides 151 (*)    All other components within normal limits  C-REACTIVE PROTEIN - Abnormal; Notable for the following components:   CRP 1.0 (*)    All other components within normal limits  CULTURE, BLOOD (ROUTINE X 2)  CULTURE, BLOOD (ROUTINE X 2)  LACTIC ACID, PLASMA  CBC WITH DIFFERENTIAL/PLATELET  D-DIMER, QUANTITATIVE (NOT AT [facility])  PROCALCITONIN  LACTATE DEHYDROGENASE  FERRITIN  FIBRINOGEN  LACTIC ACID, PLASMA  ____________________________________________  EKG  NSR. Normal rate. Narrow QRS. No ST elevation or depression. No STEMI.  ____________________________________________  RADIOLOGY  DG Chest Port 1 View  Result Date: 05/06/2019 CLINICAL DATA:  COVID EXAM: PORTABLE CHEST 1 VIEW COMPARISON:  05/02/2019 FINDINGS: No significant change in [facility] portable examination with subtle bilateral heterogeneous airspace opacity, consistent with multifocal infection. No new airspace opacity. The right lung base is significantly obscured by stimulator control box projecting over the right chest. Cardiomegaly. IMPRESSION: 1. No interval change in subtle bilateral heterogeneous airspace opacities, consistent with multifocal infection and reported diagnosis of COVID-19. 2. No new airspace opacity. 3. Cardiomegaly. Electronically Signed   By: Eddie Candle M.D.   On: 05/06/2019 16:06    ____________________________________________   PROCEDURES  Procedure(s) performed:    Procedures  None  ____________________________________________   INITIAL IMPRESSION / ASSESSMENT AND PLAN / ED COURSE  Pertinent labs & imaging results that were available during my care of the patient were reviewed by me and considered in my medical decision making (see chart for details).   Patient presents to the emergency department with known COVID-19 infection complaining of generalized body aches and fatigue.  I do not appreciate any focal neurologic deficits.  She is not hypoxic. Plan for screen labs and reassess after labs and CXR.   06:00 PM  Patient's inflammatory markers are not significantly elevated.  She has maintained normal oxygen saturation while in the emergency department with no increased work of breathing.  Not seen indication for admission at this time plan for discharge home with continued expectant management and return with any new or worsening symptoms. Attempted to contact sister by phone without success.   TREZURE ANDING was evaluated in Emergency Department on 05/06/2019 for the symptoms described in the history of present illness. She was evaluated in the context of the global COVID-19 pandemic, which necessitated consideration that the patient might be at risk for infection with the SARS-CoV-2 virus that causes COVID-19. Institutional protocols and algorithms that pertain to the evaluation of patients at risk for COVID-19 are in a state of rapid change based on information released by regulatory bodies including the CDC and federal and state organizations. These policies and algorithms were followed during the patient's care in the ED.  ____________________________________________  FINAL CLINICAL IMPRESSION(S) / ED DIAGNOSES  Final diagnoses:  COVID-19  Generalized body aches    Note:  This document was prepared using Dragon voice recognition software and may include unintentional dictation errors.  Nanda Quinton, MD, [facility] Emergency Medicine     Jaline Pincock, W[facility], MD 05/06/19 [phone]

## 2019-05-06 NOTE — ED Triage Notes (Addendum)
Pt brought in by EMS and has positive covid. Lives at [facility]. Has generalized body aches and fever . Pt requested to be transported . Pt reports coughing

## 2019-05-11 LAB — CULTURE, BLOOD (ROUTINE X 2)
Culture: NO GROWTH
Culture: NO GROWTH
Special Requests: ADEQUATE

## 2019-07-06 DIAGNOSIS — R030 Elevated blood-pressure reading, without diagnosis of hypertension: Secondary | ICD-10-CM | POA: Insufficient documentation

## 2019-08-06 ENCOUNTER — Emergency Department (HOSPITAL_COMMUNITY)
Admission: EM | Admit: 2019-08-06 | Discharge: 2019-08-06 | Disposition: A | Discharge status: Home / Self Care | Payer: Medicare Other | Attending: Emergency Medicine | Admitting: Emergency Medicine

## 2019-08-06 ENCOUNTER — Other Ambulatory Visit: Payer: Self-pay

## 2019-08-06 ENCOUNTER — Emergency Department (HOSPITAL_COMMUNITY): Payer: Medicare Other

## 2019-08-06 ENCOUNTER — Encounter (HOSPITAL_COMMUNITY): Payer: Self-pay

## 2019-08-06 DIAGNOSIS — W19XXXA Unspecified fall, initial encounter: Secondary | ICD-10-CM

## 2019-08-06 DIAGNOSIS — S40011A Contusion of right shoulder, initial encounter: Secondary | ICD-10-CM

## 2019-08-06 DIAGNOSIS — W050XXA Fall from non-moving wheelchair, initial encounter: Secondary | ICD-10-CM | POA: Diagnosis not present

## 2019-08-06 DIAGNOSIS — Z79899 Other long term (current) drug therapy: Secondary | ICD-10-CM | POA: Insufficient documentation

## 2019-08-06 DIAGNOSIS — J029 Acute pharyngitis, unspecified: Secondary | ICD-10-CM

## 2019-08-06 DIAGNOSIS — Y92129 Unspecified place in nursing home as the place of occurrence of the external cause: Secondary | ICD-10-CM | POA: Insufficient documentation

## 2019-08-06 DIAGNOSIS — S4991XA Unspecified injury of right shoulder and upper arm, initial encounter: Secondary | ICD-10-CM | POA: Diagnosis present

## 2019-08-06 DIAGNOSIS — Y939 Activity, unspecified: Secondary | ICD-10-CM | POA: Diagnosis not present

## 2019-08-06 DIAGNOSIS — Y999 Unspecified external cause status: Secondary | ICD-10-CM | POA: Insufficient documentation

## 2019-08-06 DIAGNOSIS — Z87891 Personal history of nicotine dependence: Secondary | ICD-10-CM | POA: Insufficient documentation

## 2019-08-06 DIAGNOSIS — I10 Essential (primary) hypertension: Secondary | ICD-10-CM | POA: Diagnosis not present

## 2019-08-06 HISTORY — DX: Cerebral infarction, unspecified: I63.9

## 2019-08-06 HISTORY — DX: Vitamin D deficiency, unspecified: E55.9

## 2019-08-06 MED ORDER — CEPHALEXIN 500 MG PO CAPS: 500.0000 mg | ORAL_CAPSULE | Freq: Three times a day (TID) | ORAL | 0 refills | Status: DC

## 2019-08-06 NOTE — Discharge Instructions (Addendum)
Rest.  Take Tylenol 650 mg every 6 hours as needed for pain.  Begin taking Keflex as prescribed.  Return to the emergency department if symptoms significantly worsen or change.

## 2019-08-06 NOTE — ED Provider Notes (Signed)
[facility] EMERGENCY DEPARTMENT Provider Note   CSN: [id number] Arrival date & time: 08/06/19  0935     History Chief Complaint  Patient presents with  . Fall    Courtney Tucker is a 74 y.o. female.  Patient is a 74 year old female brought from her extended care facility for evaluation of fall.  Apparently patient was leaning over in her wheelchair when she fell forward and injured her right shoulder.  She complains of pain to her posterior right shoulder and back.  She denies any difficulty breathing.  She denies any numbness or tingling.  She denies any head injury or loss of consciousness.  She denies any neck pain.  The history is provided by the patient.  Fall This is a new problem. The current episode started less than 1 hour ago. The problem occurs constantly. The problem has not changed since onset.Pertinent negatives include no chest pain. Nothing aggravates the symptoms. Nothing relieves the symptoms. She has tried nothing for the symptoms.       Past Medical History:  Diagnosis Date  . Anxiety   . Arthritis   . Complication of anesthesia   . Depression   . Depression   . Dysphasia   . GERD (gastroesophageal reflux disease)   . Hyperlipidemia   . Hypertension   . Parkinson's disease ([place])    diagnosed at age 34  . PONV (postoperative nausea and vomiting)    states it has been a long time since getting sick  . Reflux   . Sleep apnea    Has CPAP but doesnt use  . Stroke ([place])   . TIA (transient ischemic attack)    not really TIA but abnormal MRI per pt left sided weakness  . UTI (lower urinary tract infection)   . Vitamin D deficiency     Patient Active Problem List   Diagnosis Date Noted  . Syncope 03/30/2019  . TIA (transient ischemic attack) 03/29/2019  . 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 Parkinson's disease ([place]) 02/23/2015  . Encounter for screening colonoscopy 11/14/2013  . Dysphagia, unspecified(787.20) 11/14/2013  .  Parkinson disease ([place]) 08/05/2013  . Depression, major 07/25/2013  . Anxiety 12/30/2012  . Paralysis agitans ([place]) 10/15/2012  . 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 Abnormality of gait 10/15/2012  . Dysphasia 10/15/2012  . HYPERLIPIDEMIA-MIXED 02/14/2009  . Essential hypertension 02/14/2009  . DYSPNEA 02/14/2009  . CHEST PAIN-UNSPECIFIED 02/14/2009    Past Surgical History:  Procedure Laterality Date  . BACK SURGERY    . Battery change     DBS, 12/2009  . BIOPSY N/A 11/29/2013   Procedure: GASTRIC BIOPSY;  Surgeon: Danie Binder, MD;  Location: [facility];  Service: Endoscopy;  Laterality: N/A;  . BURR HOLE W/ STEREOTACTIC INSERTION OF DBS LEADS / INTRAOP MICROELECTRODE RECORDING     2007  . CARPAL TUNNEL RELEASE Right   . COLONOSCOPY WITH PROPOFOL N/A 11/29/2013   Procedure: COLONOSCOPY WITH PROPOFOL;  Surgeon: Danie Binder, MD;  Location: [facility];  Service: Endoscopy;  Laterality: N/A;  entered cecum @ [phone]; total cecal withdrawal time 21 minutes   . ELBOW SURGERY Left    after fx  . ESOPHAGEAL DILATION N/A 11/29/2013   Procedure: ESOPHAGEAL DILATION;  Surgeon: Danie Binder, MD;  Location: [facility];  Service: Endoscopy;  Laterality: N/A;  [place] 14/15/16  . ESOPHAGOGASTRODUODENOSCOPY (EGD) WITH PROPOFOL N/A 11/29/2013   Procedure: ESOPHAGOGASTRODUODENOSCOPY (EGD) WITH PROPOFOL;  Surgeon: Danie Binder, MD;  Location[facility];  Service: Endoscopy;  Laterality: N/A;  . FOOT SURGERY Left    "something with 2nd 2."  . NECK SURGERY     Fusion  . POLYPECTOMY N/A 11/29/2013   Procedure: POLYPECTOMY;  Surgeon: Danie Binder, MD;  Location: [facility];  Service: Endoscopy;  Laterality: N/A;  Distal Transverse Colon x2   . PR ANALYZE NEUROSTIM BRAIN, FIRST 1H  10/15/2012      . PULSE GENERATOR IMPLANT Right 03/30/2018   Procedure: Right Implantable pulse generator change;  Surgeon: Erline Levine, MD;  Location: [place];  Service: Neurosurgery;  Laterality: Right;  . SUBTHALAMIC STIMULATOR BATTERY  REPLACEMENT N/A 08/05/2013   Procedure: SUBTHALAMIC STIMULATOR BATTERY REPLACEMENT;  Surgeon: Erline Levine, MD;  Location: [place] [facility];  Service: Neurosurgery;  Laterality: N/A;  . SUBTHALAMIC STIMULATOR BATTERY REPLACEMENT N/A 11/26/2015   Procedure: Implantable pulse generator battery change for deep brain stimulator;  Surgeon: Erline Levine, MD;  Location: [place] [facility];  Service: Neurosurgery;  Laterality: N/A;  . VAGINAL HYSTERECTOMY     "partial"  . WRIST SURGERY Right    after fx     OB History   No obstetric history on file.     Family History  Problem Relation Age of Onset  . Colon cancer Paternal Uncle     Social History   Tobacco Use  . Smoking status: Former Smoker    Packs/day: 0.50    Years: 25.00    Pack years: 12.50    Types: Cigarettes    Quit date: 03/01/2019    Years since quitting: 0.4  . Smokeless tobacco: Never Used  Substance Use Topics  . Alcohol use: Yes    Comment: 1/2 glass wine rarely  . Drug use: No    Home Medications Prior to Admission medications   Medication Sig Start Date End Date Taking? Authorizing Provider  acetaminophen (TYLENOL) 500 MG tablet Take 500 mg by mouth every 6 (six) hours as needed for mild pain or moderate pain.    [provider]  albuterol (VENTOLIN HFA) 108 (90 Base) MCG/ACT inhaler Inhale 2 puffs into the lungs every 6 (six) hours as needed for wheezing or shortness of breath. 05/02/19   Milton Ferguson, MD  amLODipine (NORVASC) 10 MG tablet Take 10 mg by mouth daily.     [provider]  Carbidopa-Levodopa ER (SINEMET CR) 25-100 MG tablet controlled release Take 1 tablet by mouth every morning. 04/04/19 05/04/19  Manuella Ghazi, Pratik D, DO  Cholecalciferol (VITAMIN D) 50 MCG (2000 UT) tablet Take 2,000 Units by mouth daily.    [provider]  clopidogrel (PLAVIX) 75 MG tablet Take 75 mg by mouth daily.    [provider]  DULoxetine (CYMBALTA) 60 MG capsule Take 60 mg by mouth 2 (two) times  daily.     [provider]  losartan (COZAAR) 100 MG tablet Take 100 mg by mouth daily.    [provider]  omeprazole (PRILOSEC) 20 MG capsule Take 20 mg by mouth daily.    [provider]  oxybutynin (DITROPAN) 5 MG tablet Take 5 mg by mouth 2 (two) times daily.    [provider]  pramipexole (MIRAPEX) 1 MG tablet Take 1.5 mg by mouth at bedtime.  06/18/16   [provider]  traZODone (DESYREL) 100 MG tablet Take 100 mg by mouth at bedtime.    [provider]    Allergies    Oxycodone, Aspirin, and Codeine  Review of Systems   Review of Systems  Cardiovascular: Negative for chest pain.  All other systems reviewed and are negative.   Physical Exam Updated Vital Signs BP 140/60   Pulse 87   Temp 97.6 F (36.4 C)   Resp 17   SpO2 99%   Physical Exam Vitals and nursing note reviewed.  Constitutional:      General: She is not in acute distress.    Appearance: Normal appearance. She is not ill-appearing.  HENT:     Head: Normocephalic and atraumatic.  Cardiovascular:     Rate and Rhythm: Normal rate and regular rhythm.     Heart sounds: No murmur.  Pulmonary:     Effort: Pulmonary effort is normal. No respiratory distress.     Breath sounds: No stridor. No wheezing.  Musculoskeletal:     Cervical back: Normal range of motion and neck supple. No rigidity or tenderness.     Comments: There is tenderness to palpation to the posterior right shoulder/scapular area.  There is no palpable abnormality or crepitus.  She seems to have good range of motion with the right shoulder.  Ulnar and radial pulses are palpable and motor and sensation is intact throughout the entire hand.  Skin:    General: Skin is warm and dry.  Neurological:     Mental Status: She is alert and oriented to person, place, and time.     ED Results / Procedures / Treatments   Labs (all labs ordered are listed, but only abnormal results are displayed) Labs  Reviewed - No data to display  EKG None  Radiology No results found.  Procedures Procedures (including critical care time)  Medications Ordered in ED Medications - No data to display  ED Course  I have reviewed the triage vital signs and the nursing notes.  Pertinent labs & imaging results that were available during my care of the patient were reviewed by me and considered in my medical decision making (see chart for details).    MDM Rules/Calculators/A&P  Patient presenting after a fall.  She is complaining of right shoulder pain.  She also appears tender over the posterior chest/scapula.  X-rays show no evidence for fracture or dislocation.  There is no evidence for rib fracture or pneumothorax.  Patient seems appropriate for discharge.  She does describe a sore throat which has been ongoing for several days.  Patient's throat appears somewhat erythematous without exudates.  She will be prescribed Keflex for this.  She is to follow-up as needed.  Final Clinical Impression(s) / ED Diagnoses Final diagnoses:  None    Rx / DC Orders ED Discharge Orders    None       Veryl Speak, MD 08/06/19 1104

## 2019-08-06 NOTE — ED Notes (Signed)
Called for EMS transportation. Was told  " it will be awhile"

## 2019-08-06 NOTE — ED Notes (Signed)
Report given to [place], [place] at [facility].  Was instructed to call ems to transport pt because they did not have anyone to pick her up.

## 2019-08-06 NOTE — ED Triage Notes (Signed)
EMS reports pt is a resident of [facility] long term care and reports leaned over in wheelchair and fell out.  C/O R shoulder pain.  Pt able to move r arm.

## 2019-09-22 ENCOUNTER — Other Ambulatory Visit (HOSPITAL_COMMUNITY): Payer: Self-pay | Admitting: Internal Medicine

## 2019-09-22 ENCOUNTER — Other Ambulatory Visit: Payer: Self-pay

## 2019-09-22 ENCOUNTER — Ambulatory Visit (HOSPITAL_COMMUNITY)
Admission: RE | Admit: 2019-09-22 | Discharge: 2019-09-22 | Disposition: A | Discharge status: Home / Self Care | Payer: Medicare Other | Source: Ambulatory Visit | Attending: Internal Medicine | Admitting: Internal Medicine

## 2019-09-22 DIAGNOSIS — R0600 Dyspnea, unspecified: Secondary | ICD-10-CM

## 2019-10-02 ENCOUNTER — Emergency Department (HOSPITAL_COMMUNITY): Payer: Medicare Other

## 2019-10-02 ENCOUNTER — Other Ambulatory Visit: Payer: Self-pay

## 2019-10-02 ENCOUNTER — Emergency Department (HOSPITAL_COMMUNITY)
Admission: EM | Admit: 2019-10-02 | Discharge: 2019-10-02 | Disposition: A | Discharge status: Home / Self Care | Payer: Medicare Other | Attending: Emergency Medicine | Admitting: Emergency Medicine

## 2019-10-02 ENCOUNTER — Encounter (HOSPITAL_COMMUNITY): Payer: Self-pay | Admitting: Emergency Medicine

## 2019-10-02 DIAGNOSIS — Z8673 Personal history of transient ischemic attack (TIA), and cerebral infarction without residual deficits: Secondary | ICD-10-CM | POA: Diagnosis not present

## 2019-10-02 DIAGNOSIS — Z7901 Long term (current) use of anticoagulants: Secondary | ICD-10-CM | POA: Insufficient documentation

## 2019-10-02 DIAGNOSIS — G20 Parkinson's disease: Secondary | ICD-10-CM | POA: Insufficient documentation

## 2019-10-02 DIAGNOSIS — M79605 Pain in left leg: Secondary | ICD-10-CM | POA: Diagnosis not present

## 2019-10-02 DIAGNOSIS — I10 Essential (primary) hypertension: Secondary | ICD-10-CM | POA: Diagnosis not present

## 2019-10-02 DIAGNOSIS — Z79899 Other long term (current) drug therapy: Secondary | ICD-10-CM | POA: Insufficient documentation

## 2019-10-02 DIAGNOSIS — Z87891 Personal history of nicotine dependence: Secondary | ICD-10-CM | POA: Insufficient documentation

## 2019-10-02 LAB — CBC WITH DIFFERENTIAL/PLATELET
Abs Immature Granulocytes: 0.02 10*3/uL (ref 0.00–0.07)
Basophils Absolute: 0.1 10*3/uL (ref 0.0–0.1)
Basophils Relative: 1 %
Eosinophils Absolute: 0.1 10*3/uL (ref 0.0–0.5)
Eosinophils Relative: 1 %
HCT: 45.2 % (ref 36.0–46.0)
Hemoglobin: 15.4 g/dL — ABNORMAL HIGH (ref 12.0–15.0)
Immature Granulocytes: 0 %
Lymphocytes Relative: 23 %
Lymphs Abs: 2 10*3/uL (ref 0.7–4.0)
MCH: 32.2 pg (ref 26.0–34.0)
MCHC: 34.1 g/dL (ref 30.0–36.0)
MCV: 94.4 fL (ref 80.0–100.0)
Monocytes Absolute: 0.5 10*3/uL (ref 0.1–1.0)
Monocytes Relative: 6 %
Neutro Abs: 5.8 10*3/uL (ref 1.7–7.7)
Neutrophils Relative %: 69 %
Platelets: 280 10*3/uL (ref 150–400)
RBC: 4.79 MIL/uL (ref 3.87–5.11)
RDW: 12.7 % (ref 11.5–15.5)
WBC: 8.4 10*3/uL (ref 4.0–10.5)
nRBC: 0 % (ref 0.0–0.2)

## 2019-10-02 LAB — BASIC METABOLIC PANEL
Anion gap: 10 (ref 5–15)
BUN: 24 mg/dL — ABNORMAL HIGH (ref 8–23)
CO2: 28 mmol/L (ref 22–32)
Calcium: 9.3 mg/dL (ref 8.9–10.3)
Chloride: 102 mmol/L (ref 98–111)
Creatinine, Ser: 0.84 mg/dL (ref 0.44–1.00)
GFR calc Af Amer: >60 mL/min (ref >60)
GFR calc non Af Amer: >60 mL/min (ref >60)
Glucose, Bld: 103 mg/dL — ABNORMAL HIGH (ref 70–99)
Potassium: 4.4 mmol/L (ref 3.5–5.1)
Sodium: 140 mmol/L (ref 135–145)

## 2019-10-02 MED ORDER — ONDANSETRON 4 MG PO TBDP
4.0000 mg | ORAL_TABLET | Freq: Once | ORAL | Status: AC
  Administered 2019-10-02: 4 mg via ORAL
  Filled 2019-10-02: qty 1

## 2019-10-02 MED ORDER — IOHEXOL 300 MG/ML  SOLN
100.0000 mL | Freq: Once | INTRAMUSCULAR | Status: AC | PRN
  Administered 2019-10-02: 75 mL via INTRAVENOUS

## 2019-10-02 MED ORDER — LIDOCAINE 5 % EX PTCH
1.0000 | MEDICATED_PATCH | Freq: Once | CUTANEOUS | Status: DC
  Administered 2019-10-02: 1 via TRANSDERMAL
  Filled 2019-10-02: qty 1

## 2019-10-02 MED ORDER — DIAZEPAM 2 MG PO TABS
2.0000 mg | ORAL_TABLET | Freq: Once | ORAL | Status: AC
  Administered 2019-10-02: 2 mg via ORAL
  Filled 2019-10-02: qty 1

## 2019-10-02 MED ORDER — OXYCODONE-ACETAMINOPHEN 5-325 MG PO TABS
1.0000 | ORAL_TABLET | Freq: Once | ORAL | Status: AC
  Administered 2019-10-02: 1 via ORAL
  Filled 2019-10-02: qty 1

## 2019-10-02 MED ORDER — FENTANYL CITRATE (PF) 100 MCG/2ML IJ SOLN
50.0000 ug | Freq: Once | INTRAMUSCULAR | Status: AC
  Administered 2019-10-02: 50 ug via INTRAVENOUS
  Filled 2019-10-02: qty 2

## 2019-10-02 MED ORDER — HYDROCODONE-ACETAMINOPHEN 5-325 MG PO TABS: 1.0000 | ORAL_TABLET | Freq: Four times a day (QID) | ORAL | 0 refills | Status: DC | PRN

## 2019-10-02 NOTE — ED Provider Notes (Signed)
[facility] EMERGENCY DEPARTMENT Provider Note   CSN: [id number] Arrival date & time: 10/02/19  1202     History Chief Complaint  Patient presents with   Leg Pain    Courtney Tucker is a 74 y.o. female with a history of parkinson's disease, anxiety, depression, hypertension, hyperlipidemia, sleep apnea, prior stroke, and paralysis agitans who presents to the ED from [facility] [place] [facility] for evaluation of left lower extremity pain for the past 1 week.  Patient states she is having pain from the left hip to the left knee as well as to the back, it is constant, worse with movement, no alleviating factors.  She does not recall specific injury.  She states she is nonambulatory at baseline.  She denies acute numbness, tingling, weakness, or incontinence.  She denies history of cancer, IVDU, fever, or chills. HPI     Past Medical History:  Diagnosis Date   Anxiety    Arthritis    Complication of anesthesia    Depression    Depression    Dysphasia    GERD (gastroesophageal reflux disease)    Hyperlipidemia    Hypertension    Parkinson's disease ([place])    diagnosed at age 30   PONV (postoperative nausea and vomiting)    states it has been a long time since getting sick   Reflux    Sleep apnea    Has CPAP but doesnt use   Stroke [facility])    TIA (transient ischemic attack)    not really TIA but abnormal MRI per pt left sided weakness   UTI (lower urinary tract infection)    Vitamin D deficiency     Patient Active Problem List   Diagnosis Date Noted   Syncope 03/30/2019   TIA (transient ischemic attack) 03/29/2019   S/P deep brain stimulator placement 11/13/2015   Parkinson's disease ([place]) 02/23/2015   Encounter for screening colonoscopy 11/14/2013   Dysphagia, unspecified(787.20) 11/14/2013   Parkinson disease ([place]) 08/05/2013   Depression, major 07/25/2013   Anxiety 12/30/2012   Paralysis agitans ([place]) 10/15/2012   Pseudobulbar affect  10/15/2012   Abnormality of gait 10/15/2012   Dysphasia 10/15/2012   HYPERLIPIDEMIA-MIXED 02/14/2009   Essential hypertension 02/14/2009   DYSPNEA 02/14/2009   CHEST PAIN-UNSPECIFIED 02/14/2009    Past Surgical History:  Procedure Laterality Date   BACK SURGERY     Battery change     DBS, 12/2009   BIOPSY N/A 11/29/2013   Procedure: GASTRIC BIOPSY;  Surgeon: Danie Binder, MD;  Location: [facility];  Service: Endoscopy;  Laterality: N/A;   BURR HOLE W/ STEREOTACTIC INSERTION OF DBS LEADS / INTRAOP MICROELECTRODE RECORDING     2007   CARPAL TUNNEL RELEASE Right    COLONOSCOPY WITH PROPOFOL N/A 11/29/2013   Procedure: COLONOSCOPY WITH PROPOFOL;  Surgeon: Danie Binder, MD;  Location: [facility];  Service: Endoscopy;  Laterality: N/A;  entered cecum @ [phone]; total cecal withdrawal time 21 minutes    ELBOW SURGERY Left    after fx   ESOPHAGEAL DILATION N/A 11/29/2013   Procedure: ESOPHAGEAL DILATION;  Surgeon: Danie Binder, MD;  Location: [facility];  Service: Endoscopy;  Laterality: N/A;  [place] 14/15/16   ESOPHAGOGASTRODUODENOSCOPY (EGD) WITH PROPOFOL N/A 11/29/2013   Procedure: ESOPHAGOGASTRODUODENOSCOPY (EGD) WITH PROPOFOL;  Surgeon: Danie Binder, MD;  Location: [facility];  Service: Endoscopy;  Laterality: N/A;   FOOT SURGERY Left    "something with 2nd 2."   NECK SURGERY     Fusion  POLYPECTOMY N/A 11/29/2013   Procedure: POLYPECTOMY;  Surgeon: Danie Binder, MD;  Location: [facility];  Service: Endoscopy;  Laterality: N/A;  Distal Transverse Colon x2    PR ANALYZE NEUROSTIM BRAIN, FIRST 1H  10/15/2012       PULSE GENERATOR IMPLANT Right 03/30/2018   Procedure: Right Implantable pulse generator change;  Surgeon: Erline Levine, MD;  Location: [place];  Service: Neurosurgery;  Laterality: Right;   SUBTHALAMIC STIMULATOR BATTERY REPLACEMENT N/A 08/05/2013   Procedure: SUBTHALAMIC STIMULATOR BATTERY REPLACEMENT;  Surgeon: Erline Levine, MD;  Location: [place] [facility];  Service: Neurosurgery;   Laterality: N/A;   SUBTHALAMIC STIMULATOR BATTERY REPLACEMENT N/A 11/26/2015   Procedure: Implantable pulse generator battery change for deep brain stimulator;  Surgeon: Erline Levine, MD;  Location: [place] [facility];  Service: Neurosurgery;  Laterality: N/A;   VAGINAL HYSTERECTOMY     "partial"   WRIST SURGERY Right    after fx     OB History   No obstetric history on file.     Family History  Problem Relation Age of Onset   Colon cancer Paternal Uncle     Social History   Tobacco Use   Smoking status: Former Smoker    Packs/day: 0.50    Years: 25.00    Pack years: 12.50    Types: Cigarettes    Quit date: 03/01/2019    Years since quitting: 0.5   Smokeless tobacco: Never Used  Substance Use Topics   Alcohol use: Yes    Comment: 1/2 glass wine rarely   Drug use: No    Home Medications Prior to Admission medications   Medication Sig Start Date End Date Taking? Authorizing Provider  acetaminophen (TYLENOL) 500 MG tablet Take 500 mg by mouth every 6 (six) hours as needed for mild pain or moderate pain.    [provider]  albuterol (VENTOLIN HFA) 108 (90 Base) MCG/ACT inhaler Inhale 2 puffs into the lungs every 6 (six) hours as needed for wheezing or shortness of breath. 05/02/19   Milton Ferguson, MD  amLODipine (NORVASC) 10 MG tablet Take 10 mg by mouth daily.     [provider]  Carbidopa-Levodopa ER (SINEMET CR) 25-100 MG tablet controlled release Take 1 tablet by mouth every morning. 04/04/19 05/04/19  Manuella Ghazi, Pratik D, DO  cephALEXin (KEFLEX) 500 MG capsule Take 1 capsule (500 mg total) by mouth 3 (three) times daily. 08/06/19   Veryl Speak, MD  Cholecalciferol (VITAMIN D) 50 MCG (2000 UT) tablet Take 2,000 Units by mouth daily.    [provider]  clopidogrel (PLAVIX) 75 MG tablet Take 75 mg by mouth daily.    [provider]  DULoxetine (CYMBALTA) 60 MG capsule Take 60 mg by mouth 2 (two) times daily.     [provider]    losartan (COZAAR) 100 MG tablet Take 100 mg by mouth daily.    [provider]  omeprazole (PRILOSEC) 20 MG capsule Take 20 mg by mouth daily.    [provider]  oxybutynin (DITROPAN) 5 MG tablet Take 5 mg by mouth 2 (two) times daily.    [provider]  pramipexole (MIRAPEX) 1 MG tablet Take 1.5 mg by mouth at bedtime.  06/18/16   [provider]  traZODone (DESYREL) 100 MG tablet Take 100 mg by mouth at bedtime.    [provider]    Allergies    Oxycodone, Aspirin, and Codeine  Review of Systems   Review of Systems  Constitutional: Negative  for chills and fever.  Respiratory: Negative for shortness of breath.   Cardiovascular: Negative for chest pain.  Gastrointestinal: Negative for abdominal pain.  Genitourinary: Negative for dysuria and hematuria.  Musculoskeletal: Positive for arthralgias and back pain.  Neurological: Negative for weakness and numbness.       Negative for incontinence or saddle anesthesia.  All other systems reviewed and are negative.   Physical Exam Updated Vital Signs BP (!) 184/89    Pulse 92    Temp 98 F (36.7 C) (Oral)    Resp 20    Ht 5\' 2"  (1.575 m)    Wt 67 kg    SpO2 98%    BMI 27.02 kg/m   Physical Exam Vitals and nursing note reviewed.  Constitutional:      Appearance: She is well-developed. She is not ill-appearing or toxic-appearing.     Comments: Patient appears somewhat uncomfortable.  HENT:     Head: Normocephalic and atraumatic.  Eyes:     General:        Right eye: No discharge.        Left eye: No discharge.     Conjunctiva/sclera: Conjunctivae normal.  Cardiovascular:     Rate and Rhythm: Normal rate and regular rhythm.     Pulses:          Dorsalis pedis pulses are 2+ on the right side and 2+ on the left side.       Posterior tibial pulses are 2+ on the right side and 2+ on the left side.  Pulmonary:     Effort: Pulmonary effort is normal. No respiratory distress.     Breath  sounds: Normal breath sounds. No wheezing, rhonchi or rales.  Abdominal:     General: There is no distension.     Palpations: Abdomen is soft.     Tenderness: There is no abdominal tenderness. There is no guarding or rebound.  Musculoskeletal:     Cervical back: Neck supple.     Comments: Back: Patient has diffuse midline lumbar spine tenderness extending to the left lumbar paraspinal muscles.  No point/focal vertebral tenderness Lower extremities: Patient has intact active range of motion throughout, she is uncomfortable with left hip and knee active range of motion.  She is tender to the diffuse hip, more so laterally, to the diffuse thigh, as well as to the diffuse knee. The L thigh muscles seem tight, question degree of swelling. Compartments are soft.  Otherwise nontender.  No significant erythema or warmth noted.  She does have symmetric pitting edema to the lower legs.  Calves are nontender.  Skin:    General: Skin is warm and dry.     Capillary Refill: Capillary refill takes less than 2 seconds.     Findings: No rash.  Neurological:     Mental Status: She is alert.     Comments: Alert. Clear speech. Sensation grossly intact to bilateral lower extremities. 5/5 strength with plantar/dorsiflexion bilaterally.   Psychiatric:        Mood and Affect: Mood normal.        Behavior: Behavior normal.     ED Results / Procedures / Treatments   Labs (all labs ordered are listed, but only abnormal results are displayed) Labs Reviewed - No data to display  EKG None  Radiology DG Lumbar Spine Complete  Result Date: 10/02/2019 CLINICAL DATA:  Low back pain. EXAM: LUMBAR SPINE - COMPLETE 4+ VIEW COMPARISON:  03/20/2019 FINDINGS: Technically suboptimal exam due to patient  habitus. There is no evidence of acute lumbar spine fracture or subluxation. Previous PLIF again seen at L4-5. Degenerative disc disease seen at all lumbar levels. Bilateral facet DJD is also noted. Generalized osteopenia  noted, but no focal lytic or sclerotic bone lesions identified. IMPRESSION: 1. Technically suboptimal exam due to patient habitus. No acute findings. 2. Osteopenia and degenerative spondylosis, as described above. Electronically Signed   By: Marlaine Hind M.D.   On: 10/02/2019 13:59   CT FEMUR LEFT W CONTRAST  Result Date: 10/02/2019 CLINICAL DATA:  Upper leg pain and swelling EXAM: CT OF THE LOWER RIGHT EXTREMITY WITH CONTRAST TECHNIQUE: Multidetector CT imaging of the lower right extremity was performed according to the standard protocol following intravenous contrast administration. COMPARISON:  Radiographs from 10/02/2019 CONTRAST:  72mL OMNIPAQUE IOHEXOL 300 MG/ML  SOLN FINDINGS: Bones/Joint/Cartilage Bony demineralization. No fracture or visible periostitis. Stress fractures are better seen at MRI but there is no obvious sclerotic band to indicate an overt stress fracture on CT. Ligaments Suboptimally assessed by CT. Muscles and Tendons Unremarkable Soft tissues Trace edema tracking along the lateral superficial fascia margin of the upper thigh for example on image 182/5, probably incidental. IMPRESSION: 1. Bony demineralization. No visible fracture or visible periostitis. 2. Trace edema tracking along the lateral superficial fascia margin of the upper thigh, probably incidental. Electronically Signed   By: Van Clines M.D.   On: 10/02/2019 16:04   DG Knee Complete 4 Views Left  Result Date: 10/02/2019 CLINICAL DATA:  Left knee pain. EXAM: LEFT KNEE - COMPLETE 4+ VIEW COMPARISON:  None. FINDINGS: No evidence of fracture, dislocation, or joint effusion. No evidence of arthropathy or other focal bone abnormality. Generalized osteopenia noted. Soft tissues are unremarkable. IMPRESSION: Osteopenia. No acute findings. Electronically Signed   By: Marlaine Hind M.D.   On: 10/02/2019 13:59   DG Hip Unilat With Pelvis 2-3 Views Left  Result Date: 10/02/2019 CLINICAL DATA:  Left hip pain. EXAM: DG HIP  (WITH OR WITHOUT PELVIS) 2-3V LEFT COMPARISON:  09/22/2019 FINDINGS: There is no evidence of hip fracture or dislocation. There is no evidence of arthropathy. Mild degenerative changes seen involving the pubic symphysis. Generalized osteopenia noted. Lower lumbar spine fusion hardware. IMPRESSION: No acute findings. Osteopenia. Pubic symphysis DJD. Electronically Signed   By: Marlaine Hind M.D.   On: 10/02/2019 14:01    Procedures Procedures (including critical care time)  Medications Ordered in ED Medications  lidocaine (LIDODERM) 5 % 1 patch (1 patch Transdermal Patch Applied 10/02/19 1252)  oxyCODONE-acetaminophen (PERCOCET/ROXICET) 5-325 MG per tablet 1 tablet (1 tablet Oral Given 10/02/19 1252)  ondansetron (ZOFRAN-ODT) disintegrating tablet 4 mg (4 mg Oral Given 10/02/19 1252)  fentaNYL (SUBLIMAZE) injection 50 mcg (50 mcg Intravenous Given 10/02/19 1535)  diazepam (VALIUM) tablet 2 mg (2 mg Oral Given 10/02/19 1530)  iohexol (OMNIPAQUE) 300 MG/ML solution 100 mL (75 mLs Intravenous Contrast Given 10/02/19 1544)    ED Course  I have reviewed the triage vital signs and the nursing notes.  Pertinent labs & imaging results that were available during my care of the patient were reviewed by me and considered in my medical decision making (see chart for details).    MDM Rules/Calculators/A&P                     Patient presents to the emergency department for evaluation of pain from the left hip to the left knee.  She appears somewhat uncomfortable.  Blood pressure noted to be elevated,  vitals otherwise within normal limits, low suspicion for hypertensive emergency.  Patient has no erythema, warmth, or rashes, exam does not seem consistent with shingles, cellulitis, or septic joint.  She has symmetric pitting edema to the lower legs, calves are nontender, low suspicion for DVT. Symmetric temperatures noted to the LEs, intact pulses, doubt arterial emboli. Initially plain films were ordered which have been  personally reviewed and interpreted, no acute pathology noted.  She was given Percocet for pain as well as Zofran to help avoid nausea.  14:15: RE-EVAL: Patient with continued discomfort. Will obtain CT femur with contrast & obtain basic labs for further assessment. Fentanyl& valium ordered.   Her labs have been reviewed and personally interpreted including CBC and BMP which are fairly unremarkable.  CT left femur with contrast: 1. Bony demineralization. No visible fracture or visible periostitis. 2. Trace edema tracking along the lateral superficial fascia margin of the upper thigh, probably incidental  16:40: RE-EVAL: Patient's pain is much improved.  She remains neurovascularly intact distally.  Appears much more relaxed.  Will discontinue tramadol given by nursing care facility, short course of Norco. I discussed results, treatment plan, need for follow-up, and return precautions with the patient. Provided opportunity for questions, patient confirmed understanding and is in agreement with plan.   This is a shared visit with supervising physician Dr. Roderic Palau who has independently evaluated patient & provided guidance in evaluation/management/disposition, in agreement with care   Final Clinical Impression(s) / ED Diagnoses Final diagnoses:  Left leg pain    Rx / DC Orders ED Discharge Orders         Ordered    HYDROcodone-acetaminophen (NORCO/VICODIN) 5-325 MG tablet  Every 6 hours PRN     10/02/19 1645           Lc Joynt, [place], PA-C 10/02/19 1653    Milton Ferguson, MD 10/03/19 [phone]

## 2019-10-02 NOTE — ED Notes (Signed)
EMS called for transport.

## 2019-10-02 NOTE — ED Notes (Signed)
Pt verbalized she believes the pain in her left leg is coming from her back. Pt continues to moan in pain.  Pt rolled to her side and pt stated it helped with pain.

## 2019-10-02 NOTE — ED Notes (Signed)
Per sister Pt can not have a MRI due to Deep Brain stimulator.

## 2019-10-02 NOTE — ED Triage Notes (Signed)
LT leg pain from knee to groin, has been seen in ED for same recently.  Given 50mg  tramadol at 1000 at [facility].

## 2019-10-02 NOTE — Discharge Instructions (Addendum)
You were seen in the emergency department today for left leg pain.  Your x-rays and CT scans as well as your labs were overall reassuring.  Please stop taking tramadol.  We are sending you home with a take-home pack of Norco.  -Norco (hydrocodone-tylenol)-this is a narcotic/controlled substance medication that has potential addicting qualities.  We recommend that you take 1 tablet every 6 hours as needed for severe pain.  Do not drive or operate heavy machinery when taking this medicine as it can be sedating. Do not drink alcohol or take other sedating medications when taking this medicine for safety reasons.  Keep this out of reach of small children.  Please be aware this medicine has Tylenol in it (325 mg/tab) do not exceed the maximum dose of Tylenol in a day per over the counter recommendations should you decide to supplement with Tylenol over the counter.   We have prescribed you new medication(s) today. Discuss the medications prescribed today with your pharmacist as they can have adverse effects and interactions with your other medicines including over the counter and prescribed medications. Seek medical evaluation if you start to experience new or abnormal symptoms after taking one of these medicines, seek care immediately if you start to experience difficulty breathing, feeling of your throat closing, facial swelling, or rash as these could be indications of a more serious allergic reaction  Please have your blood pressure rechecked at your follow-up appointment as it was elevated in the ER today. We would like you to follow-up with your primary care provider and/or orthopedics within 3 to 5 days for reevaluation.  Return to the ER for new or worsening symptoms or any other concerns.

## 2019-10-02 NOTE — ED Notes (Signed)
Report given to [facility].

## 2019-10-03 MED FILL — Hydrocodone-Acetaminophen Tab 5-325 MG: ORAL | Qty: 6 | Status: AC

## 2019-10-10 ENCOUNTER — Other Ambulatory Visit: Payer: Self-pay

## 2019-10-10 ENCOUNTER — Ambulatory Visit (INDEPENDENT_AMBULATORY_CARE_PROVIDER_SITE_OTHER): Payer: Medicare Other | Admitting: Orthopedic Surgery

## 2019-10-10 ENCOUNTER — Encounter: Payer: Self-pay | Admitting: Orthopedic Surgery

## 2019-10-10 VITALS — BP 130/89 | HR 70 | Ht 62.0 in

## 2019-10-10 DIAGNOSIS — M5442 Lumbago with sciatica, left side: Secondary | ICD-10-CM

## 2019-10-10 DIAGNOSIS — M5410 Radiculopathy, site unspecified: Secondary | ICD-10-CM | POA: Diagnosis not present

## 2019-10-10 DIAGNOSIS — G8929 Other chronic pain: Secondary | ICD-10-CM | POA: Diagnosis not present

## 2019-10-10 DIAGNOSIS — Z981 Arthrodesis status: Secondary | ICD-10-CM

## 2019-10-10 MED ORDER — HYDROCODONE-ACETAMINOPHEN 5-325 MG PO TABS: 1.0000 | ORAL_TABLET | Freq: Four times a day (QID) | ORAL | 0 refills | Status: DC | PRN

## 2019-10-10 NOTE — Progress Notes (Signed)
Chief Complaint  Patient presents with  . Knee Pain    left  . Groin Pain    left    History    Chief Complaint  Patient presents with  . Leg Pain      Courtney Tucker is a 74 y.o. female with a history of parkinson's disease, anxiety, depression, hypertension, hyperlipidemia, sleep apnea, prior stroke, and paralysis agitans who presents [facility] the ED from [facility] [place] [facility] for evaluation of left lower extremity pain for the past 1 week.  Patient states she is having pain from the left hip to the left knee as well as to the back, it is constant, worse with movement, no alleviating factors.  She does not recall specific injury.  She states she is nonambulatory at baseline.  She denies acute numbness, tingling, weakness, or incontinence.  She denies history of cancer, IVDU, fever, or chills. HPI  74 year old female had a lumbar fusion by Dr. Carloyn Manner.  Patient continued to have back pain but medical condition prevented any further intervention surgically.  She was managed with tramadol for pain and an acute episode of back pain and left leg pain was seen in the emergency room had a CT scan of her femur which was negative at hip and back x-rays which instrumented lumbar fusion with surrounding degenerative changes  No acute findings.  Pain was relieved with hydrocodone but only a 5-day prescription was given.  Review of Systems  Constitutional: Negative for fever.  Musculoskeletal: Positive for back pain and joint pain.  Neurological: Positive for speech change and weakness.  Psychiatric/Behavioral: Positive for memory loss.   Past Medical History:  Diagnosis Date  . Anxiety   . Arthritis   . Complication of anesthesia   . Depression   . Depression   . Dysphasia   . GERD (gastroesophageal reflux disease)   . Hyperlipidemia   . Hypertension   . Parkinson's disease ([place])    diagnosed at age 1  . PONV (postoperative nausea and vomiting)    states it has been a long time since  getting sick  . Reflux   . Sleep apnea    Has CPAP but doesnt use  . Stroke ([place])   . TIA (transient ischemic attack)    not really TIA but abnormal MRI per pt left sided weakness  . UTI (lower urinary tract infection)   . Vitamin D deficiency     BP 130/89   Pulse 70   Ht 5\' 2"  (1.575 m)   BMI 27.02 kg/m   Patient has very poor dentition as well as for her speech.  She comes in in a wheelchair.  Images reviewed include CT scan I do not see any fracture there. Hip x-rays negative for fracture or arthritis Left knee x-ray negative for acute fracture Lumbar spine film shows instrumented fusion appears to be [id number] without complication  Recommend chronic pain management referral is made  Prescription for hydrocodone for 5 days  Encounter Diagnoses  Name Primary?  . Radicular pain of left lower extremity Yes  . Chronic left-sided low back pain with left-sided sciatica   . Status post lumbar spinal fusion    Meds ordered this encounter  Medications  . HYDROcodone-acetaminophen (NORCO/VICODIN) 5-325 MG tablet    Sig: Take 1 tablet by mouth every 6 (six) hours as needed for moderate pain.    Dispense:  30 tablet    Refill:  0    No follow up

## 2019-10-10 NOTE — Patient Instructions (Signed)
Chronic back pain status post fusion.  Acute exacerbation of underlying post fusion pain.  Patient referred to pain management no surgical intervention at this time

## 2019-10-14 ENCOUNTER — Encounter: Payer: Self-pay | Admitting: Orthopedic Surgery

## 2019-10-19 DIAGNOSIS — M545 Low back pain, unspecified: Secondary | ICD-10-CM | POA: Insufficient documentation

## 2019-10-19 DIAGNOSIS — M5416 Radiculopathy, lumbar region: Secondary | ICD-10-CM | POA: Insufficient documentation

## 2019-10-27 ENCOUNTER — Telehealth: Payer: Self-pay | Admitting: Radiology

## 2019-10-27 NOTE — Telephone Encounter (Signed)
Unable to reach patient about appointment with pain management Sent letter for her to call Closed referral

## 2020-01-02 DIAGNOSIS — Z6834 Body mass index (BMI) 34.0-34.9, adult: Secondary | ICD-10-CM | POA: Insufficient documentation

## 2020-01-02 DIAGNOSIS — M542 Cervicalgia: Secondary | ICD-10-CM | POA: Insufficient documentation

## 2020-01-03 ENCOUNTER — Encounter (HOSPITAL_COMMUNITY): Payer: Self-pay

## 2020-01-03 ENCOUNTER — Other Ambulatory Visit: Payer: Self-pay

## 2020-01-03 ENCOUNTER — Emergency Department (HOSPITAL_COMMUNITY)
Admission: EM | Admit: 2020-01-03 | Discharge: 2020-01-03 | Disposition: A | Discharge status: Skilled Nursing Facility | Payer: Medicare Other | Attending: Emergency Medicine | Admitting: Emergency Medicine

## 2020-01-03 ENCOUNTER — Emergency Department (HOSPITAL_COMMUNITY): Payer: Medicare Other

## 2020-01-03 DIAGNOSIS — M25562 Pain in left knee: Secondary | ICD-10-CM | POA: Insufficient documentation

## 2020-01-03 DIAGNOSIS — G8929 Other chronic pain: Secondary | ICD-10-CM

## 2020-01-03 DIAGNOSIS — M5442 Lumbago with sciatica, left side: Secondary | ICD-10-CM

## 2020-01-03 DIAGNOSIS — M25552 Pain in left hip: Secondary | ICD-10-CM | POA: Insufficient documentation

## 2020-01-03 DIAGNOSIS — R1032 Left lower quadrant pain: Secondary | ICD-10-CM | POA: Diagnosis not present

## 2020-01-03 DIAGNOSIS — M25561 Pain in right knee: Secondary | ICD-10-CM | POA: Diagnosis not present

## 2020-01-03 DIAGNOSIS — M545 Low back pain: Secondary | ICD-10-CM | POA: Insufficient documentation

## 2020-01-03 MED ORDER — HYDROCODONE-ACETAMINOPHEN 5-325 MG PO TABS: 1.0000 | ORAL_TABLET | ORAL | 0 refills | Status: DC | PRN

## 2020-01-03 MED ORDER — FENTANYL CITRATE (PF) 100 MCG/2ML IJ SOLN
50.0000 ug | Freq: Once | INTRAMUSCULAR | Status: AC
  Administered 2020-01-03: 50 ug via INTRAVENOUS
  Filled 2020-01-03: qty 2

## 2020-01-03 MED ORDER — HYDROCODONE-ACETAMINOPHEN 5-325 MG PO TABS: 1.0000 | ORAL_TABLET | Freq: Once | ORAL | Status: DC

## 2020-01-03 MED ORDER — ONDANSETRON HCL 4 MG/2ML IJ SOLN
4.0000 mg | Freq: Once | INTRAMUSCULAR | Status: AC
  Administered 2020-01-03: 4 mg via INTRAVENOUS
  Filled 2020-01-03: qty 2

## 2020-01-03 MED ORDER — HYDROMORPHONE HCL 1 MG/ML IJ SOLN
1.0000 mg | Freq: Once | INTRAMUSCULAR | Status: AC
  Administered 2020-01-03: 1 mg via INTRAVENOUS
  Filled 2020-01-03: qty 1

## 2020-01-03 NOTE — ED Provider Notes (Signed)
Medical screening examination/treatment/procedure(s) were conducted as a shared visit with non-physician practitioner(s) and myself.  I personally evaluated the patient during the encounter.      Patient brought in by EMS.  Patient is a resident at [facility] facility.  Patient reports left hip pain left groin pain pain down to her knees.  Patient arrived in.  To be in a lot of of pain.  Patient just has Tylenol 3 available there.  Although initial report was a just has Tylenol.  Eventually got patient's pain under control was improved some with fentanyl but then hydromorphone helped out a lot.  Initial x-rays raised a lot of concern for changes in the lumbar back.  No evidence of any kind of hip fracture on plain films.  But since she was in so much pain we wanted to CT the left hip area and I wanted to do MRI of the lumbar spine.  Patient's had a fusion done in the past by Dr. Vertell Limber neurosurgery.  But patient has a stimulator in place where she is followed by neurology.  So MRI was not a possibility so we CT the lumbar area as well.  CT showed no evidence of any hip fracture.  Showed no evidence of any lumbar fracture.  But did show a lot of canal narrowing and severe bilateral neural foraminal narrowing L3-L4.  Which may result in some of the pain.  Also showed evidence of status post fixation of L4-L5.  Left-sided interbody fusion.  Hardware all appeared fine.  Plan will be recommend better pain control at the nursing facility follow-up with orthopedics and/or neurosurgery.  Would recommend follow-up with Dr. Melven Sartorius office since he has seen her before.  He is with [facility].  Patient stable for discharge back to nursing facility.   Fredia Sorrow, MD 01/03/20 2238

## 2020-01-03 NOTE — ED Notes (Signed)
Placed pt on 2LPM via [place] due to O2 being 88% on RA after Dilaudid dose given.

## 2020-01-03 NOTE — ED Triage Notes (Signed)
EMS reports left leg pain that starts in left groin and goes down to her knee.  Pt resident of [facility] and fell to her knees  in the bathroom today.  EMS says was told she has this pain a lot  And they normally treat with tylenol.  Reports tylenol not helping today.

## 2020-01-03 NOTE — Discharge Instructions (Addendum)
Take the medicine prescribed if needed for pain.    Avoid lifting,  Bending,  Twisting or any other activity that worsens your pain over the next week.  Apply a heating pad to your lower back 20 minutes several times daily.   You should get rechecked if your symptoms are not improving or you develop increased pain,  Weakness in your leg(s) or loss of bladder or bowel function - these are symptoms of a worse injury.

## 2020-01-05 ENCOUNTER — Telehealth: Payer: Self-pay

## 2020-01-05 NOTE — Telephone Encounter (Signed)
Received a call on 01/04/20 from [facility]- Director for [place]. She inquired if Courtney Tucker would consider removing the dismissal on Courtney Tucker. She informed me that the patient has been removed from the care of the family and is now under the their [employer]) and that social services serves as her legal guardian. She noted the family is not allowed to be involved in patient care or attend appointments with the patient but that social services would attend all appointments with the patient. I also received a call on 01/04/20 from Marinda Elk [phone]) from social services asking if we needed a letter from her stating the facts above. I stated that I did not need a letter at this time however I would speak with Courtney Tucker. In speaking with Courtney Tucker  we decided that the dismissal would remain in affect. I called Tammy @ [facility] back today (01/05/20 @ 3:12pm) and informed her that we would not reverse the dismissal. She verbalized understanding and asked for the contact information for another neurology group in the area. I gave her the contact information for [facility] but could not confirm if they would be able to accommodate her.

## 2020-01-22 ENCOUNTER — Emergency Department (HOSPITAL_COMMUNITY): Payer: Medicare Other

## 2020-01-22 ENCOUNTER — Encounter (HOSPITAL_COMMUNITY): Payer: Self-pay

## 2020-01-22 ENCOUNTER — Observation Stay (HOSPITAL_COMMUNITY)
Admission: EM | Admit: 2020-01-22 | Discharge: 2020-01-23 | Disposition: A | Discharge status: Home / Self Care | Payer: Medicare Other | Attending: Family Medicine | Admitting: Family Medicine

## 2020-01-22 ENCOUNTER — Other Ambulatory Visit: Payer: Self-pay

## 2020-01-22 DIAGNOSIS — G20 Parkinson's disease: Secondary | ICD-10-CM | POA: Insufficient documentation

## 2020-01-22 DIAGNOSIS — I10 Essential (primary) hypertension: Secondary | ICD-10-CM | POA: Insufficient documentation

## 2020-01-22 DIAGNOSIS — E785 Hyperlipidemia, unspecified: Secondary | ICD-10-CM | POA: Diagnosis not present

## 2020-01-22 DIAGNOSIS — Z20822 Contact with and (suspected) exposure to covid-19: Secondary | ICD-10-CM | POA: Diagnosis not present

## 2020-01-22 DIAGNOSIS — K219 Gastro-esophageal reflux disease without esophagitis: Secondary | ICD-10-CM | POA: Diagnosis not present

## 2020-01-22 DIAGNOSIS — G8929 Other chronic pain: Secondary | ICD-10-CM | POA: Insufficient documentation

## 2020-01-22 DIAGNOSIS — R269 Unspecified abnormalities of gait and mobility: Secondary | ICD-10-CM

## 2020-01-22 DIAGNOSIS — R4701 Aphasia: Secondary | ICD-10-CM | POA: Diagnosis present

## 2020-01-22 DIAGNOSIS — G459 Transient cerebral ischemic attack, unspecified: Secondary | ICD-10-CM | POA: Diagnosis not present

## 2020-01-22 DIAGNOSIS — G9341 Metabolic encephalopathy: Secondary | ICD-10-CM | POA: Diagnosis not present

## 2020-01-22 DIAGNOSIS — U071 COVID-19: Secondary | ICD-10-CM | POA: Diagnosis present

## 2020-01-22 DIAGNOSIS — Z9689 Presence of other specified functional implants: Secondary | ICD-10-CM

## 2020-01-22 DIAGNOSIS — F329 Major depressive disorder, single episode, unspecified: Secondary | ICD-10-CM | POA: Insufficient documentation

## 2020-01-22 DIAGNOSIS — Z79899 Other long term (current) drug therapy: Secondary | ICD-10-CM | POA: Insufficient documentation

## 2020-01-22 DIAGNOSIS — R531 Weakness: Secondary | ICD-10-CM

## 2020-01-22 DIAGNOSIS — M542 Cervicalgia: Secondary | ICD-10-CM | POA: Diagnosis not present

## 2020-01-22 DIAGNOSIS — Z8673 Personal history of transient ischemic attack (TIA), and cerebral infarction without residual deficits: Secondary | ICD-10-CM | POA: Insufficient documentation

## 2020-01-22 LAB — COMPREHENSIVE METABOLIC PANEL
ALT: 7 U/L (ref 0–44)
AST: 17 U/L (ref 15–41)
Albumin: 3.9 g/dL (ref 3.5–5.0)
Alkaline Phosphatase: 112 U/L (ref 38–126)
Anion gap: 9 (ref 5–15)
BUN: 13 mg/dL (ref 8–23)
CO2: 28 mmol/L (ref 22–32)
Calcium: 9 mg/dL (ref 8.9–10.3)
Chloride: 102 mmol/L (ref 98–111)
Creatinine, Ser: 0.75 mg/dL (ref 0.44–1.00)
GFR calc Af Amer: >60 mL/min (ref >60)
GFR calc non Af Amer: >60 mL/min (ref >60)
Glucose, Bld: 129 mg/dL — ABNORMAL HIGH (ref 70–99)
Potassium: 3.4 mmol/L — ABNORMAL LOW (ref 3.5–5.1)
Sodium: 139 mmol/L (ref 135–145)
Total Bilirubin: 0.6 mg/dL (ref 0.3–1.2)
Total Protein: 7.3 g/dL (ref 6.5–8.1)

## 2020-01-22 LAB — URINALYSIS, ROUTINE W REFLEX MICROSCOPIC
Bilirubin Urine: NEGATIVE
Glucose, UA: NEGATIVE mg/dL
Hgb urine dipstick: NEGATIVE
Ketones, ur: NEGATIVE mg/dL
Leukocytes,Ua: NEGATIVE
Nitrite: NEGATIVE
Protein, ur: NEGATIVE mg/dL
Specific Gravity, Urine: 1.033 — ABNORMAL HIGH (ref 1.005–1.030)
pH: 8 (ref 5.0–8.0)

## 2020-01-22 LAB — PROTIME-INR
INR: 1 (ref 0.8–1.2)
Prothrombin Time: 12.7 seconds (ref 11.4–15.2)

## 2020-01-22 LAB — CBC WITH DIFFERENTIAL/PLATELET
Abs Immature Granulocytes: 0.02 10*3/uL (ref 0.00–0.07)
Basophils Absolute: 0 10*3/uL (ref 0.0–0.1)
Basophils Relative: 1 %
Eosinophils Absolute: 0.1 10*3/uL (ref 0.0–0.5)
Eosinophils Relative: 1 %
HCT: 45.1 % (ref 36.0–46.0)
Hemoglobin: 15.1 g/dL — ABNORMAL HIGH (ref 12.0–15.0)
Immature Granulocytes: 0 %
Lymphocytes Relative: 37 %
Lymphs Abs: 2.1 10*3/uL (ref 0.7–4.0)
MCH: 31.6 pg (ref 26.0–34.0)
MCHC: 33.5 g/dL (ref 30.0–36.0)
MCV: 94.4 fL (ref 80.0–100.0)
Monocytes Absolute: 0.4 10*3/uL (ref 0.1–1.0)
Monocytes Relative: 8 %
Neutro Abs: 3 10*3/uL (ref 1.7–7.7)
Neutrophils Relative %: 53 %
Platelets: 269 10*3/uL (ref 150–400)
RBC: 4.78 MIL/uL (ref 3.87–5.11)
RDW: 12.3 % (ref 11.5–15.5)
WBC: 5.7 10*3/uL (ref 4.0–10.5)
nRBC: 0 % (ref 0.0–0.2)

## 2020-01-22 LAB — SARS CORONAVIRUS 2 BY RT PCR (HOSPITAL ORDER, PERFORMED IN ~~LOC~~ HOSPITAL LAB): SARS Coronavirus 2: NEGATIVE

## 2020-01-22 LAB — GLUCOSE, CAPILLARY: Glucose-Capillary: 95 mg/dL (ref 70–99)

## 2020-01-22 LAB — RAPID URINE DRUG SCREEN, HOSP PERFORMED
Amphetamines: NOT DETECTED
Barbiturates: NOT DETECTED
Benzodiazepines: POSITIVE — AB
Cocaine: NOT DETECTED
Opiates: NOT DETECTED
Tetrahydrocannabinol: NOT DETECTED

## 2020-01-22 LAB — ETHANOL: Alcohol, Ethyl (B): <10 mg/dL (ref <10)

## 2020-01-22 LAB — APTT: aPTT: 29 seconds (ref 24–36)

## 2020-01-22 MED ORDER — DULOXETINE HCL 60 MG PO CPEP
60.0000 mg | ORAL_CAPSULE | Freq: Two times a day (BID) | ORAL | Status: DC
  Administered 2020-01-22 – 2020-01-23 (×2): 60 mg via ORAL
  Filled 2020-01-22 (×2): qty 1

## 2020-01-22 MED ORDER — SODIUM CHLORIDE 0.9 % IV SOLN: 250.0000 mL | INTRAVENOUS | Status: DC | PRN

## 2020-01-22 MED ORDER — SODIUM CHLORIDE 0.9% FLUSH
3.0000 mL | Freq: Two times a day (BID) | INTRAVENOUS | Status: DC
  Administered 2020-01-22 – 2020-01-23 (×2): 3 mL via INTRAVENOUS

## 2020-01-22 MED ORDER — POLYETHYLENE GLYCOL 3350 17 G PO PACK: 17.0000 g | PACK | Freq: Every day | ORAL | Status: DC | PRN

## 2020-01-22 MED ORDER — ATORVASTATIN CALCIUM 40 MG PO TABS
40.0000 mg | ORAL_TABLET | Freq: Every day | ORAL | Status: DC
  Administered 2020-01-22 – 2020-01-23 (×2): 40 mg via ORAL
  Filled 2020-01-22 (×2): qty 1

## 2020-01-22 MED ORDER — PANTOPRAZOLE SODIUM 40 MG PO TBEC
40.0000 mg | DELAYED_RELEASE_TABLET | Freq: Every day | ORAL | Status: DC
  Administered 2020-01-23: 40 mg via ORAL
  Filled 2020-01-22: qty 1

## 2020-01-22 MED ORDER — AMLODIPINE BESYLATE 5 MG PO TABS
10.0000 mg | ORAL_TABLET | Freq: Every day | ORAL | Status: DC
  Administered 2020-01-23: 10 mg via ORAL
  Filled 2020-01-22: qty 2

## 2020-01-22 MED ORDER — ALBUTEROL SULFATE (2.5 MG/3ML) 0.083% IN NEBU: 3.0000 mL | INHALATION_SOLUTION | Freq: Four times a day (QID) | RESPIRATORY_TRACT | Status: DC | PRN

## 2020-01-22 MED ORDER — MOMETASONE FURO-FORMOTEROL FUM 200-5 MCG/ACT IN AERO
2.0000 | INHALATION_SPRAY | Freq: Two times a day (BID) | RESPIRATORY_TRACT | Status: DC
  Administered 2020-01-22 – 2020-01-23 (×2): 2 via RESPIRATORY_TRACT
  Filled 2020-01-22: qty 8.8

## 2020-01-22 MED ORDER — SODIUM CHLORIDE 0.9% FLUSH: 3.0000 mL | INTRAVENOUS | Status: DC | PRN

## 2020-01-22 MED ORDER — ACETAMINOPHEN 500 MG PO TABS: 500.0000 mg | ORAL_TABLET | Freq: Four times a day (QID) | ORAL | Status: DC | PRN

## 2020-01-22 MED ORDER — LABETALOL HCL 5 MG/ML IV SOLN: 10.0000 mg | INTRAVENOUS | Status: DC | PRN

## 2020-01-22 MED ORDER — TRAZODONE HCL 50 MG PO TABS
100.0000 mg | ORAL_TABLET | Freq: Every day | ORAL | Status: DC
  Administered 2020-01-22: 100 mg via ORAL
  Filled 2020-01-22: qty 2

## 2020-01-22 MED ORDER — CLOPIDOGREL BISULFATE 75 MG PO TABS
75.0000 mg | ORAL_TABLET | Freq: Every day | ORAL | Status: DC
  Administered 2020-01-23: 75 mg via ORAL
  Filled 2020-01-22: qty 1

## 2020-01-22 MED ORDER — HYDROCODONE-ACETAMINOPHEN 5-325 MG PO TABS
1.0000 | ORAL_TABLET | Freq: Four times a day (QID) | ORAL | Status: DC | PRN
  Administered 2020-01-23 (×2): 1 via ORAL
  Filled 2020-01-22 (×2): qty 1

## 2020-01-22 MED ORDER — ALPRAZOLAM 0.5 MG PO TABS: 0.5000 mg | ORAL_TABLET | Freq: Two times a day (BID) | ORAL | Status: DC | PRN

## 2020-01-22 MED ORDER — VITAMIN D3 25 MCG PO TABS
2000.0000 [IU] | ORAL_TABLET | Freq: Every day | ORAL | Status: DC
  Administered 2020-01-23: 2000 [IU] via ORAL
  Filled 2020-01-22 (×4): qty 2

## 2020-01-22 MED ORDER — ONDANSETRON HCL 4 MG/2ML IJ SOLN: 4.0000 mg | Freq: Four times a day (QID) | INTRAMUSCULAR | Status: DC | PRN

## 2020-01-22 MED ORDER — HEPARIN SODIUM (PORCINE) 5000 UNIT/ML IJ SOLN
5000.0000 [IU] | Freq: Three times a day (TID) | INTRAMUSCULAR | Status: DC
  Administered 2020-01-22 – 2020-01-23 (×3): 5000 [IU] via SUBCUTANEOUS
  Filled 2020-01-22 (×3): qty 1

## 2020-01-22 MED ORDER — ONDANSETRON HCL 4 MG PO TABS: 4.0000 mg | ORAL_TABLET | Freq: Four times a day (QID) | ORAL | Status: DC | PRN

## 2020-01-22 MED ORDER — LOSARTAN POTASSIUM 50 MG PO TABS
100.0000 mg | ORAL_TABLET | Freq: Every day | ORAL | Status: DC
  Administered 2020-01-23: 100 mg via ORAL
  Filled 2020-01-22: qty 2

## 2020-01-22 MED ORDER — OXYBUTYNIN CHLORIDE 5 MG PO TABS
5.0000 mg | ORAL_TABLET | Freq: Two times a day (BID) | ORAL | Status: DC
  Administered 2020-01-22 – 2020-01-23 (×2): 5 mg via ORAL
  Filled 2020-01-22 (×2): qty 1

## 2020-01-22 MED ORDER — PRAMIPEXOLE DIHYDROCHLORIDE 1 MG PO TABS
1.5000 mg | ORAL_TABLET | Freq: Every day | ORAL | Status: DC
  Administered 2020-01-22: 1.5 mg via ORAL
  Filled 2020-01-22 (×2): qty 2

## 2020-01-22 MED ORDER — IOHEXOL 350 MG/ML SOLN
100.0000 mL | Freq: Once | INTRAVENOUS | Status: AC | PRN
  Administered 2020-01-22: 100 mL via INTRAVENOUS

## 2020-01-22 NOTE — ED Triage Notes (Signed)
EMS reports pt resident of [facility] and has history of stroke.  Staff says pt was acting unusual at breakfast this morning.  Around 9am, staff says pt's speech is more slurred than usual and pt leaning more to the right today.  Appears to have r sided facial droop and c/o left leg pain.

## 2020-01-22 NOTE — Progress Notes (Signed)
Pt arrived to room #338 via stretcher from ED. Moved from stretcher to bed with 3 staff assist. Pt oriented to room and safety procedures, stated understanding. VSS. Purewick intact to wall suction. Call bell with patient.

## 2020-01-22 NOTE — Consult Note (Signed)
[facility] Tele[facility]urology Consult Services  Stat Consult  Date of Service:   01/22/2020 11:01:01  Impression:     .  I63.9 - Cerebrovascular accident (CVA), unspecified mechanism ([place])  Comments/Sign-Out: [name] is a 74 y.o. lady with a pmh of advanced Parkinson's disease s/p DBS placement, baseline severe dysarthria, previous stroke and TIA with baseline left sided weakness per chart review (of note also has a R AFO in place), HTN, HLD, Depression, Anxiety, and other medical issues who presents to the ER with symptoms of worsened left sided weakness and slurred speech. Exam at this time shows marked hypophonia, dysarthria, mild LUE weakness and b/l LE weakness that more severely affects the LLE. There is LUE and LLE numbness to LT. She also has a R AFO in place. She has some tremors in her right shoulder that resolve when she activates the limb. These are not seizures. She is mildly inattentive, but fully oriented and answers all questions appropriately. She is difficult to understand, but most of the time is intelligible. Rarely, when she speaks she cannot be understood. There is no anomia or impaired repetition. She speaks in brief sentences or single word responses. She only had some mild hesitancy when trying to say her last name. Overall, her exam does not appear to be that different from what is documented previously. The LUE may be a bit weaker. CT head shows no acute intracranial process. The differential for her worsening speech and left sided weakness would include new subcortical R MCA ischemic stroke or possibly could be related to worsening PD with occult infection. I confirmed with [place] that she does take the Xanax pretty regularly which suggests against this being a medication affect. Stroke recrudescence would also be a consideration. She is not an Alteplase candidate as her LKW is >4.5 hours from presentation. Labs in the ER are essentially  unremarkable. CTA head and neck and CTP Brain were obtained and are unremarkable. Her headache is likely a migraine headache and I do not think this represents any acute vascular pathology. At this time I recommend admission for further evaluation and treatment. I spoke to Dr. Alvino Chapel of the ER about my assessment and recommendations who reported understanding.  CT HEAD: Showed No Acute Hemorrhage or Acute Core Infarct  Metrics: [facility] Notification Time: 01/22/2020 10:58:31 Stamp Time: 01/22/2020 11:01:01 Callback Response Time: 01/22/2020 11:02:11  Our recommendations are outlined below.  Recommendations:     .  Recommend Euglycemia nd Euthermia     .  Recommend cardiac telemetry     .  Recommend q4h neuro-checks     .  Recommend MRI Brain WO to ensure no new infarct if can be obtained with DBS, otherwise would repeat Head CT at 24 hours     .  Recommend TTE, A1c, and Lipid panel     .  Recommend continuing home Plavix pending swallow eval, no load as already on this medication     .  Recommend Aspirin 325mg  PO daily (if fails swallow eval then can do 300mg  suppository)     .  Recommend continuing Plavix 75mg  daily with Aspirin 325mg  daily for 21 days, thereafter continue Plavix monotherapy     .  Continue home Pramipexole and Sinemet ER pending swallow eval     .  If fails swallow eval would obtain enteral access with NG tube and please notify Neurology     .  Continue Xanax 0.5mg  BID PRN     .  Can  give Tylenol 650mg  q6h PRN for headache     .  Recommend interrogating DBS to ensure it is still functioning     .  Recommend permissive HTN up to 220/120 for now, if >76HMCN change in systolic BP or >47SJGG change in diastolic BP please ensure nursing notifies the primary team and then Neurology if change in exam or other concern by primary team     .  Recommend infectious work-up with UA, CXR, COVID 19 testing, and otherwise further infectious evaluation and treatment per  ER/primary team     .  Evaluation of retained debris in the superior soft esophagus per ER/primary team     .  Evaluation of groin and anterior left leg pain per ER/primary team     .  Alert Neurology immediately with any neuro-worsening   Imaging Studies:     .  MRI Head Without Contrast     .  Echocardiogram - Transthoracic Echocardiogram  Therapies:     .  Physical Therapy     .  Occupational Therapy     .  Speech Therapy  Other WorkUp:     .  Infectious/metabolic workup per primary team  Disposition: Neurology Follow Up Recommended  Sign Out:     .  Discussed with Emergency Department Provider  ----------------------------------------------------------------------------------------------------  Chief Complaint: worsening dysarthria and left sided weakness  History of Present Illness: Patient is a 74 year old Female.  [name] is a 74 y.o. lady with a pmh of advanced Parkinson's disease s/p DBS placement, baseline severe dysarthria, previous stroke and TIA with baseline left sided weakness per chart review (of note also has a R AFO in place), HTN, HLD, Depression, Anxiety, and other medical issues who presents to the ER with symptoms of worsened left sided weakness and slurred speech. The patient and her med tech [name] at her facility [phone]) provide the history. [name] reports that she was at her baseline at 2000 last night at which time she reports her voice was normal. At baseline she requires a wheelchair and needs assistance with all of her ADLs (mRS 4). She then went to sleep and at around 0530 this morning, upon awakening, noted that her left side felt weaker and when she first spoke this morning she states that her voice felt off. [place] reports that when she came to give [name] her AM medications at 0800 she seemed to be at baseline and mumbled, but was intelligible. [place] reports that she gave her Xanax 0.5mg  this AM as part of  her home medications. [name] was then noted around 0900 to be confused at breakfast and did strange things, like putting bacon in her cereal. When they checked on her she was mumbling incoherently, leaning to the right, and her left side seemed weaker prompting them to send her to the ER for further evaluation. She did not convulse or pass out. Now in the ER, [name] reports that her speech still seems off and that her left side feels weaker than normal. She also notes that she has a headache on the right parietal region which is non-radiating described as a 5/10 pain which started about 30 minutes ago and has been gradually worsening. It is described as an ache and there is light and sound sensitivity with some nausea. [name] reports that she last checked her DBS about 1 month ago at which time it was fine. She states her right neck and left arm  feel a bit numb, but otherwise denies any new numbness. The left sided weakness is in her LUE, but she also has been dealing with pain in her LLE and groin for some time. She denies any dyspnea, but has felt some subjective fever and cough. She denies any loss of vision. She notes some movements in her right shoulder that she reports are due to her PD and are chronic. Previous Neurology notes in the chart note baseline severe dysarthria, left nasolabial fold flattening, and other notes also indicate some kind of baseline LLE weakness with b/l LE weakness, but more severe left hip flexor weakness. Her home medications include Takes Plavix, Fluconazole, Nystatin, Amlodipine, Breztri inhaler, Carbidopa-Levodopa ER 25-100 1 tablet daily, Duloxetine, Losartan K, Omeprazole, Oxybutynin, Pramipexole 1.5mg  nightly, Trazadone, Vitamin D3, Narcan, and Xanax 0.5mg  BID PRN.    Past Medical History:     . Hypertension     . Hyperlipidemia     . Stroke     . There is NO history of Diabetes Mellitus     . There is NO history of Atrial Fibrillation     . There  is NO history of Coronary Artery Disease    Antiplatelet use: Clopidogrel  Anti-coagulant use: No  Labs: As discussed above  Radiology:  CT Head 01/22/2020 IMPRESSION: No acute intracranial abnormality.  Chronic findings detailed above.  CTA head and neck and CTP Brain 01/22/2020 IMPRESSION: No large vessel occlusion or hemodynamically significant stenosis.  Perfusion imaging demonstrates no evidence of core infarction or territory at risk.  Examination: BP(150/80), Pulse(86), Blood Glucose(129) 1A: Level of Consciousness - Alert; keenly responsive + 0 1B: Ask Month and Age - 1 Question Right + 1 1C: Blink Eyes & Squeeze Hands - Performs Both Tasks + 0 2: Test Horizontal Extraocular Movements - Normal + 0 3: Test Visual Fields - No Visual Loss + 0 4: Test Facial Palsy (Use Grimace if Obtunded) - Minor paralysis (flat nasolabial fold, smile asymmetry) + 1 5A: Test Left Arm Motor Drift - Drift, but doesn't hit bed + 1 5B: Test Right Arm Motor Drift - No Drift for 10 Seconds + 0 6A: Test Left Leg Motor Drift - No Drift for 5 Seconds + 0 6B: Test Right Leg Motor Drift - No Drift for 5 Seconds + 0 7: Test Limb Ataxia (FNF/Heel-Shin) - No Ataxia + 0 8: Test Sensation - Mild-Moderate Loss: Less Sharp/More Dull + 1 9: Test Language/Aphasia - Normal; No aphasia + 0 10: Test Dysarthria - Mild-Moderate Dysarthria: Slurring but can be understood + 1 11: Test Extinction/Inattention - No abnormality + 0  NIHSS Score: 5    Mental status is intact.  Patient is A&Ox3, except stated her age incorrectly.   There is no receptive or expressive aphasia.  Repetition is intact.  No anomia.  Did have some hesitancy with stating her last name, and overall only speaks in single word responses or brief sentences.  Markedly hypophonic.  No neglect.       EOMI without nystagmus.  PERL.  VF full OU.  There is right nasolabial fold flattening, but the left does not activate as well.   Moderate  dysarthria, most of the time can be understood.   Tongue midline. Sensation intact in V1-V3 to light touch b/l.   Hypomimia present.     Easily anti-gravity in all 4 extremities.  There LUE drifts some.  RUE appears to be 5/5 in grip, biceps, and triceps.  LUE is 4/5 in  grip, biceps, and triceps.  There is a tremor in the RUE shoulder that resolves with activation.  LLE is 4-/5 at the hip flexor and 4/5 with plantarflexion and dorsiflexion.  RLE is 4/5 at the hip flexor and 4+/5 with plantarflexion and 1/5 with dorsiflexion.  AFO in place on RLE. Bradykinetic.   Sensation diminished to LT in the LUE and LLE.   No dysmetria on FNF b/l.     Does not ambulate at baseline.      Patient/Family was informed the Neurology Consult would occur via TeleHealth consult by way of interactive audio and video telecommunications and consented to receiving care in this manner.  Patient is being evaluated for possible acute neurologic impairment and high probability of imminent or life-threatening deterioration. I spent total of 65 minutes providing care to this patient, including time for face to face visit via telemedicine, review of medical records, imaging studies and discussion of findings with providers, the patient and/or family.   Dr Kathee Delton   [facility] [phone]  Case [id number]

## 2020-01-22 NOTE — ED Provider Notes (Signed)
[facility] EMERGENCY DEPARTMENT Provider Note   CSN: [id number] Arrival date & time: 01/22/20  1015     History Chief Complaint  Patient presents with  . Aphasia    Courtney Tucker is a 74 y.o. female. Level 5 caveat due to difficulty speaking. HPI Patient presents from nursing home.  Reportedly acting unusual this morning.  She reportedly makes her cereal milk and bacon altogether in the bowl.  Does have a history of previous stroke and previous Parkinson's disease.  Has a brain stimulator.  Reportedly has very difficult speech to understand at baseline.  Reportedly has chronic right-sided facial droop.  Reportedly sent in because speech is more slurred and may be leaning to the right more than normal.  Patient is complaining of some back pain.    Past Medical History:  Diagnosis Date  . Anxiety   . Arthritis   . Complication of anesthesia   . Depression   . Depression   . Dysphasia   . GERD (gastroesophageal reflux disease)   . Hyperlipidemia   . Hypertension   . Parkinson's disease ([place])    diagnosed at age 48  . PONV (postoperative nausea and vomiting)    states it has been a long time since getting sick  . Reflux   . Sleep apnea    Has CPAP but doesnt use  . Stroke ([place])   . TIA (transient ischemic attack)    not really TIA but abnormal MRI per pt left sided weakness  . UTI (lower urinary tract infection)   . Vitamin D deficiency     Patient Active Problem List   Diagnosis Date Noted  . Syncope 03/30/2019  . TIA (transient ischemic attack) 03/29/2019  . 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 OAB (overactive bladder) 07/11/2015  . Parkinson's disease ([place]) 02/23/2015  . Encounter for screening colonoscopy 11/14/2013  . Dysphagia, unspecified(787.20) 11/14/2013  . Parkinson disease ([place]) 08/05/2013  . Depression, major 07/25/2013  . Anxiety 12/30/2012  . Paralysis agitans ([place]) 10/15/2012  . 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 Abnormality of  gait 10/15/2012  . Dysphasia 10/15/2012  . HYPERLIPIDEMIA-MIXED 02/14/2009  . Essential hypertension 02/14/2009  . DYSPNEA 02/14/2009  . CHEST PAIN-UNSPECIFIED 02/14/2009    Past Surgical History:  Procedure Laterality Date  . BACK SURGERY    . Battery change     DBS, 12/2009  . BIOPSY N/A 11/29/2013   Procedure: GASTRIC BIOPSY;  Surgeon: Danie Binder, MD;  Location: [facility];  Service: Endoscopy;  Laterality: N/A;  . BURR HOLE W/ STEREOTACTIC INSERTION OF DBS LEADS / INTRAOP MICROELECTRODE RECORDING     2007  . CARPAL TUNNEL RELEASE Right   . COLONOSCOPY WITH PROPOFOL N/A 11/29/2013   Procedure: COLONOSCOPY WITH PROPOFOL;  Surgeon: Danie Binder, MD;  Location: [facility];  Service: Endoscopy;  Laterality: N/A;  entered cecum @ [phone]; total cecal withdrawal time 21 minutes   . ELBOW SURGERY Left    after fx  . ESOPHAGEAL DILATION N/A 11/29/2013   Procedure: ESOPHAGEAL DILATION;  Surgeon: Danie Binder, MD;  Location: [facility];  Service: Endoscopy;  Laterality: N/A;  [place] 14/15/16  . ESOPHAGOGASTRODUODENOSCOPY (EGD) WITH PROPOFOL N/A 11/29/2013   Procedure: ESOPHAGOGASTRODUODENOSCOPY (EGD) WITH PROPOFOL;  Surgeon: Danie Binder, MD;  Location: [facility];  Service: Endoscopy;  Laterality: N/A;  . FOOT SURGERY Left    "something with 2nd 2."  . NECK SURGERY     Fusion  . POLYPECTOMY N/A 11/29/2013  Procedure: POLYPECTOMY;  Surgeon: Danie Binder, MD;  Location: [facility];  Service: Endoscopy;  Laterality: N/A;  Distal Transverse Colon x2   . PR ANALYZE NEUROSTIM BRAIN, FIRST 1H  10/15/2012      . PULSE GENERATOR IMPLANT Right 03/30/2018   Procedure: Right Implantable pulse generator change;  Surgeon: Erline Levine, MD;  Location: [place];  Service: Neurosurgery;  Laterality: Right;  . SUBTHALAMIC STIMULATOR BATTERY REPLACEMENT N/A 08/05/2013   Procedure: SUBTHALAMIC STIMULATOR BATTERY REPLACEMENT;  Surgeon: Erline Levine, MD;  Location: [place] [facility];  Service: Neurosurgery;  Laterality: N/A;  .  SUBTHALAMIC STIMULATOR BATTERY REPLACEMENT N/A 11/26/2015   Procedure: Implantable pulse generator battery change for deep brain stimulator;  Surgeon: Erline Levine, MD;  Location: [place] [facility];  Service: Neurosurgery;  Laterality: N/A;  . VAGINAL HYSTERECTOMY     "partial"  . WRIST SURGERY Right    after fx     OB History   No obstetric history on file.     Family History  Problem Relation Age of Onset  . Colon cancer Paternal Uncle     Social History   Tobacco Use  . Smoking status: Former Smoker    Packs/day: 0.50    Years: 25.00    Pack years: 12.50    Types: Cigarettes    Quit date: 03/01/2019    Years since quitting: 0.8  . Smokeless tobacco: Never Used  Vaping Use  . Vaping Use: Never used  Substance Use Topics  . Alcohol use: Yes    Comment: 1/2 glass wine rarely  . Drug use: No    Home Medications Prior to Admission medications   Medication Sig Start Date End Date Taking? Authorizing Provider  acetaminophen (TYLENOL) 500 MG tablet Take 500 mg by mouth every 6 (six) hours as needed for mild pain or moderate pain.    [provider]  ADVAIR [facility] 681-27 MCG/ACT inhaler Inhale 2 puffs into the lungs 2 (two) times daily. 08/23/19   [provider]  albuterol (VENTOLIN HFA) 108 (90 Base) MCG/ACT inhaler Inhale 2 puffs into the lungs every 6 (six) hours as needed for wheezing or shortness of breath. 05/02/19   Milton Ferguson, MD  ALPRAZolam Duanne Moron) 0.5 MG tablet Take 0.5 mg by mouth 2 (two) times daily as needed. 10/06/19   [provider]  amLODipine (NORVASC) 10 MG tablet Take 10 mg by mouth daily.     [provider]  BREZTRI AEROSPHERE 160-9-4.8 MCG/ACT AERO  09/22/19   [provider]  Carbidopa-Levodopa ER (SINEMET CR) 25-100 MG tablet controlled release Take 1 tablet by mouth every morning. 04/04/19 10/10/19  Manuella Ghazi, Pratik D, DO  Cholecalciferol (VITAMIN D) 50 MCG (2000 UT) tablet Take 2,000 Units by mouth daily.    [provider]  clopidogrel (PLAVIX) 75 MG tablet Take 75 mg by mouth daily.    [provider]  DULoxetine (CYMBALTA) 60 MG capsule Take 60 mg by mouth 2 (two) times daily.     [provider]  HYDROcodone-acetaminophen (NORCO/VICODIN) 5-325 MG tablet Take 1 tablet by mouth every 4 (four) hours as needed. 01/03/20   Evalee Jefferson, PA-C  losartan (COZAAR) 100 MG tablet Take 100 mg by mouth daily.    [provider]  omeprazole (PRILOSEC) 20 MG capsule Take 20 mg by mouth daily.    [provider]  oxybutynin (DITROPAN) 5 MG tablet Take 5 mg by mouth 2 (two) times daily.    [provider]  pramipexole (  MIRAPEX) 1 MG tablet Take 1.5 mg by mouth at bedtime.  06/18/16   [provider]  traMADol (ULTRAM) 50 MG tablet Take 50 mg by mouth 3 (three) times daily as needed. 09/26/19   [provider]  traZODone (DESYREL) 100 MG tablet Take 100 mg by mouth at bedtime.    [provider]    Allergies    Oxycodone, Aspirin, and Codeine  Review of Systems   Review of Systems  Unable to perform ROS: Patient nonverbal    Physical Exam Updated Vital Signs BP (!) 146/77   Pulse 84   Temp (!) 97.5 F (36.4 C) (Oral)   Resp (!) 21   Ht 5\' 2"  (1.575 m)   Wt 83 kg   SpO2 100%   BMI 33.47 kg/m   Physical Exam Vitals and nursing note reviewed.  HENT:     Head: Atraumatic.     Comments: Right-sided facial droop. Cardiovascular:     Rate and Rhythm: Regular rhythm.  Pulmonary:     Breath sounds: Normal breath sounds.  Abdominal:     Tenderness: There is no abdominal tenderness.  Musculoskeletal:     Cervical back: Neck supple.  Neurological:     Mental Status: She is alert.     Comments: Very difficult to understand.  Does have eye movements.  Left sided facial droop.  Does however smile well on the right side.  Will raise both upper extremities command.  Will wiggle both feet.  Has brace for foot drop on right lower extremity.      ED Results / Procedures / Treatments   Labs (all labs ordered are listed, but only abnormal results are displayed) Labs Reviewed  COMPREHENSIVE METABOLIC PANEL - Abnormal; Notable for the following components:      Result Value   Potassium 3.4 (*)    Glucose, Bld 129 (*)    All other components within normal limits  CBC WITH DIFFERENTIAL/PLATELET - Abnormal; Notable for the following components:   Hemoglobin 15.1 (*)    All other components within normal limits  SARS CORONAVIRUS 2 BY RT PCR (HOSPITAL ORDER, [facility])  URINALYSIS, ROUTINE W REFLEX MICROSCOPIC  ETHANOL  PROTIME-INR  APTT  RAPID URINE DRUG SCREEN, HOSP PERFORMED    EKG None  Radiology CT Head Wo Contrast  Result Date: 01/22/2020 CLINICAL DATA:  Abnormal speech EXAM: CT HEAD WITHOUT CONTRAST TECHNIQUE: Contiguous axial images were obtained from the base of the skull through the vertex without intravenous contrast. COMPARISON:  03/29/2019 FINDINGS: Brain: There is significant streak artifact associated with deep brain stimulator leads within the right scalp soft tissues extending to the vertex and terminating in the subthalamic regions bilaterally. Within this limitation, there is no acute intracranial hemorrhage or new loss of gray-white differentiation. Ventricles are stable in size. Patchy hypoattenuation in the supratentorial white matter is nonspecific but probably reflects stable chronic microvascular ischemic changes. Vascular: There is mild atherosclerotic calcification at the skull base. Skull: Bilateral burr holes at the vertex. Sinuses/Orbits: No acute finding. Other: None. IMPRESSION: No acute intracranial abnormality.  Chronic findings detailed above. Electronically Signed   By: Macy Mis M.D.   On: 01/22/2020 11:16   CT CEREBRAL PERFUSION W CONTRAST  Result Date: 01/22/2020 CLINICAL DATA:  Left-sided weakness EXAM: CT ANGIOGRAPHY HEAD AND NECK CT PERFUSION BRAIN  TECHNIQUE: Multidetector CT imaging of the head and neck was performed using the standard protocol during bolus administration of intravenous contrast. Multiplanar CT  image reconstructions and MIPs were obtained to evaluate the vascular anatomy. Carotid stenosis measurements (when applicable) are obtained utilizing NASCET criteria, using the distal internal carotid diameter as the denominator. Multiphase CT imaging of the brain was performed following IV bolus contrast injection. Subsequent parametric perfusion maps were calculated using RAPID software. CONTRAST:  128mL OMNIPAQUE IOHEXOL 350 MG/ML SOLN COMPARISON:  None. FINDINGS: Streak artifact from deep brain stimulator generator and leads, dental amalgam, and cervical spine fusion hardware. CTA NECK FINDINGS Aortic arch: Great vessel origins are patent. Right carotid system: Patent. Mild calcified plaque at the ICA origin without measurable stenosis. Left carotid system: Patent. No measurable stenosis at the ICA origin. Vertebral arteries: Patent.  Left vertebral artery is dominant. Skeleton: Multilevel cervical spine degenerative changes. There are postoperative changes of prior anterior fusion at C5-C7. Other neck: No mass or adenopathy. Upper chest: Retained debris within the superior soft esophagus. No apical lung mass. Review of the MIP images confirms the above findings CTA HEAD FINDINGS Anterior circulation: Intracranial internal carotid arteries are patent with minimal calcified plaque. Anterior and middle cerebral arteries are patent. Posterior circulation: Intracranial vertebral arteries are patent. Right vertebral artery is diminutive after PICA origin. There is calcified plaque likely causing moderate stenosis with limited evaluation due to small size of vessel and streak artifact. Calcified plaque causes mild stenosis of the left vertebral artery. Venous sinuses: As permitted by contrast timing, patent. Review of the MIP images confirms the above  findings CT Brain Perfusion Findings: CBF (<30%) Volume: 57mL Perfusion (Tmax>6.0s) volume: 1mL Mismatch Volume: 43mL Infarction Location: None. IMPRESSION: No large vessel occlusion or hemodynamically significant stenosis. Perfusion imaging demonstrates no evidence of core infarction or territory at risk. These results were called by telephone at the time of interpretation on 01/22/2020 at 12:13 pm to provider Davonna Belling , who verbally acknowledged these results. Electronically Signed   By: Macy Mis M.D.   On: 01/22/2020 12:17   CT ANGIO HEAD CODE STROKE  Result Date: 01/22/2020 CLINICAL DATA:  Left-sided weakness EXAM: CT ANGIOGRAPHY HEAD AND NECK CT PERFUSION BRAIN TECHNIQUE: Multidetector CT imaging of the head and neck was performed using the standard protocol during bolus administration of intravenous contrast. Multiplanar CT image reconstructions and MIPs were obtained to evaluate the vascular anatomy. Carotid stenosis measurements (when applicable) are obtained utilizing NASCET criteria, using the distal internal carotid diameter as the denominator. Multiphase CT imaging of the brain was performed following IV bolus contrast injection. Subsequent parametric perfusion maps were calculated using RAPID software. CONTRAST:  127mL OMNIPAQUE IOHEXOL 350 MG/ML SOLN COMPARISON:  None. FINDINGS: Streak artifact from deep brain stimulator generator and leads, dental amalgam, and cervical spine fusion hardware. CTA NECK FINDINGS Aortic arch: Great vessel origins are patent. Right carotid system: Patent. Mild calcified plaque at the ICA origin without measurable stenosis. Left carotid system: Patent. No measurable stenosis at the ICA origin. Vertebral arteries: Patent.  Left vertebral artery is dominant. Skeleton: Multilevel cervical spine degenerative changes. There are postoperative changes of prior anterior fusion at C5-C7. Other neck: No mass or adenopathy. Upper chest: Retained debris within the  superior soft esophagus. No apical lung mass. Review of the MIP images confirms the above findings CTA HEAD FINDINGS Anterior circulation: Intracranial internal carotid arteries are patent with minimal calcified plaque. Anterior and middle cerebral arteries are patent. Posterior circulation: Intracranial vertebral arteries are patent. Right vertebral artery is diminutive after PICA origin. There is calcified plaque likely causing moderate stenosis with limited evaluation due to small  size of vessel and streak artifact. Calcified plaque causes mild stenosis of the left vertebral artery. Venous sinuses: As permitted by contrast timing, patent. Review of the MIP images confirms the above findings CT Brain Perfusion Findings: CBF (<30%) Volume: 61mL Perfusion (Tmax>6.0s) volume: 64mL Mismatch Volume: 93mL Infarction Location: None. IMPRESSION: No large vessel occlusion or hemodynamically significant stenosis. Perfusion imaging demonstrates no evidence of core infarction or territory at risk. These results were called by telephone at the time of interpretation on 01/22/2020 at 12:13 pm to provider Davonna Belling , who verbally acknowledged these results. Electronically Signed   By: Macy Mis M.D.   On: 01/22/2020 12:17   CT ANGIO NECK CODE STROKE  Result Date: 01/22/2020 CLINICAL DATA:  Left-sided weakness EXAM: CT ANGIOGRAPHY HEAD AND NECK CT PERFUSION BRAIN TECHNIQUE: Multidetector CT imaging of the head and neck was performed using the standard protocol during bolus administration of intravenous contrast. Multiplanar CT image reconstructions and MIPs were obtained to evaluate the vascular anatomy. Carotid stenosis measurements (when applicable) are obtained utilizing NASCET criteria, using the distal internal carotid diameter as the denominator. Multiphase CT imaging of the brain was performed following IV bolus contrast injection. Subsequent parametric perfusion maps were calculated using RAPID software.  CONTRAST:  141mL OMNIPAQUE IOHEXOL 350 MG/ML SOLN COMPARISON:  None. FINDINGS: Streak artifact from deep brain stimulator generator and leads, dental amalgam, and cervical spine fusion hardware. CTA NECK FINDINGS Aortic arch: Great vessel origins are patent. Right carotid system: Patent. Mild calcified plaque at the ICA origin without measurable stenosis. Left carotid system: Patent. No measurable stenosis at the ICA origin. Vertebral arteries: Patent.  Left vertebral artery is dominant. Skeleton: Multilevel cervical spine degenerative changes. There are postoperative changes of prior anterior fusion at C5-C7. Other neck: No mass or adenopathy. Upper chest: Retained debris within the superior soft esophagus. No apical lung mass. Review of the MIP images confirms the above findings CTA HEAD FINDINGS Anterior circulation: Intracranial internal carotid arteries are patent with minimal calcified plaque. Anterior and middle cerebral arteries are patent. Posterior circulation: Intracranial vertebral arteries are patent. Right vertebral artery is diminutive after PICA origin. There is calcified plaque likely causing moderate stenosis with limited evaluation due to small size of vessel and streak artifact. Calcified plaque causes mild stenosis of the left vertebral artery. Venous sinuses: As permitted by contrast timing, patent. Review of the MIP images confirms the above findings CT Brain Perfusion Findings: CBF (<30%) Volume: 54mL Perfusion (Tmax>6.0s) volume: 34mL Mismatch Volume: 30mL Infarction Location: None. IMPRESSION: No large vessel occlusion or hemodynamically significant stenosis. Perfusion imaging demonstrates no evidence of core infarction or territory at risk. These results were called by telephone at the time of interpretation on 01/22/2020 at 12:13 pm to provider Davonna Belling , who verbally acknowledged these results. Electronically Signed   By: Macy Mis M.D.   On: 01/22/2020 12:17     Procedures Procedures (including critical care time)  Medications Ordered in ED Medications  iohexol (OMNIPAQUE) 350 MG/ML injection 100 mL (100 mLs Intravenous Contrast Given 01/22/20 1203)    ED Course  I have reviewed the triage vital signs and the nursing notes.  Pertinent labs & imaging results that were available during my care of the patient were reviewed by me and considered in my medical decision making (see chart for details).    MDM Rules/Calculators/A&P  Patient presents with potential left-sided weakness.Left normal was either this morning or potentially 8:00 last night.  Not a TPA candidate due to time of onset.  Question of stroke but good CT CTA and perfusion findings.  Seen by neurology.  Somewhat difficult and that she has a deep brain stimulator for her Parkinson's disease however.  She still should be able get an MRI.  Admission recommended.  Further recommendations and neurology note.  Recommends neurology consult tomorrow.  Will discuss with hospitalist. Final Clinical Impression(s) / ED Diagnoses Final diagnoses:  Weakness  Parkinson disease ([place])    Rx / DC Orders ED Discharge Orders    None       Davonna Belling, MD 01/22/20 1258

## 2020-01-22 NOTE — H&P (Signed)
Patient Demographics:    Courtney Tucker, is a 74 y.o. female  MRN: [id number]   DOB - 1946/04/30  Admit Date - 01/22/2020  Outpatient Primary MD for the patient is Scotty Court, DO   Assessment & Plan:    Principal Problem:   Acute metabolic encephalopathy Active Problems:   TIA (transient ischemic attack)   Essential hypertension   Paralysis agitans ([place])   Abnormality of gait   Parkinson's disease ([place])   S/P deep brain stimulator placement   H/o COVID-19 virus infection--December 2020   1) acute metabolic encephalopathy- TIA Vs Sz--- --- place on telemetry monitored unit,  --Patient is allergic to aspirin, give Plavix and Lipitor --- Unable to do MRI of the brain due to neurostimulator in situ for Parkinson's -Get echocardiogram to rule out intracardiac thrombus  and to evaluate EF is pending,  --- CTA head and neck without large Vessel occlusion or hemodynamically significant stenosis  ---  PT/OT eval pending, speech eval pending, We will allow some permissive hypertension in view of possible acute stroke, avoid precipitous drop in blood pressure. Use Hydralazine 10mg  or Labetalol 10 mg iv every 4 hrs as needed for systolic blood pressure over 210 mmhg , check TSH, A1c and fasting lipid profile,  -#Please maintain euthermia. Tylenol prn for temp >100.4 # Frequent neuro checks # NPO until passes stroke swallow screen -EEG pending --Prior  2D echocardiogram with LVEF 60 to 65% with grade 1 diastolic dysfunction--repeat pending as above -Telemetry neurology input appreciated  2)Advanced Parkinson's disease--- s/p  Brain  neurostimulator (DBS) placement --Continue current Mirapex  3)Depression Continue duloxetine daily and trazodone nightly  4)Chronic neck pain-  S/p --C5-C7 ACDF with solid  arthrodesis at C5-6 and likely pseudoarthrosis at C6-7--- continue as needed hydrocodone  Disposition/Need for in-Hospital Stay- patient unable to be discharged at this time due to --- acute metabolic encephalopathy with concerns for possible TIA or stroke versus seizures awaiting further neuro work-up  Dispo: The patient is from: ALF              Anticipated d/c is to: [facility]              Anticipated d/c date is: 1 day              Patient currently is not medically stable to d/c. Barriers: Not Clinically Stable- --stroke and seizure work-up   With History of - Reviewed by me  Past Medical History:  Diagnosis Date  . Anxiety   . Arthritis   . Complication of anesthesia   . Depression   . Depression   . Dysphasia   . GERD (gastroesophageal reflux disease)   . Hyperlipidemia   . Hypertension   . Parkinson's disease ([place])    diagnosed at age 45  . PONV (postoperative nausea and vomiting)    states it has been a long time since getting sick  . Reflux   .  Sleep apnea    Has CPAP but doesnt use  . Stroke ([place])   . TIA (transient ischemic attack)    not really TIA but abnormal MRI per pt left sided weakness  . UTI (lower urinary tract infection)   . Vitamin D deficiency       Past Surgical History:  Procedure Laterality Date  . BACK SURGERY    . Battery change     DBS, 12/2009  . BIOPSY N/A 11/29/2013   Procedure: GASTRIC BIOPSY;  Surgeon: Danie Binder, MD;  Location: [facility];  Service: Endoscopy;  Laterality: N/A;  . BURR HOLE W/ STEREOTACTIC INSERTION OF DBS LEADS / INTRAOP MICROELECTRODE RECORDING     2007  . CARPAL TUNNEL RELEASE Right   . COLONOSCOPY WITH PROPOFOL N/A 11/29/2013   Procedure: COLONOSCOPY WITH PROPOFOL;  Surgeon: Danie Binder, MD;  Location: [facility];  Service: Endoscopy;  Laterality: N/A;  entered cecum @ [phone]; total cecal withdrawal time 21 minutes   . ELBOW SURGERY Left    after fx  . ESOPHAGEAL DILATION N/A 11/29/2013   Procedure: ESOPHAGEAL  DILATION;  Surgeon: Danie Binder, MD;  Location: [facility];  Service: Endoscopy;  Laterality: N/A;  [place] 14/15/16  . ESOPHAGOGASTRODUODENOSCOPY (EGD) WITH PROPOFOL N/A 11/29/2013   Procedure: ESOPHAGOGASTRODUODENOSCOPY (EGD) WITH PROPOFOL;  Surgeon: Danie Binder, MD;  Location: [facility];  Service: Endoscopy;  Laterality: N/A;  . FOOT SURGERY Left    "something with 2nd 2."  . NECK SURGERY     Fusion  . POLYPECTOMY N/A 11/29/2013   Procedure: POLYPECTOMY;  Surgeon: Danie Binder, MD;  Location: [facility];  Service: Endoscopy;  Laterality: N/A;  Distal Transverse Colon x2   . PR ANALYZE NEUROSTIM BRAIN, FIRST 1H  10/15/2012      . PULSE GENERATOR IMPLANT Right 03/30/2018   Procedure: Right Implantable pulse generator change;  Surgeon: Erline Levine, MD;  Location: [facility];  Service: Neurosurgery;  Laterality: Right;  . SUBTHALAMIC STIMULATOR BATTERY REPLACEMENT N/A 08/05/2013   Procedure: SUBTHALAMIC STIMULATOR BATTERY REPLACEMENT;  Surgeon: Erline Levine, MD;  Location: [facility];  Service: Neurosurgery;  Laterality: N/A;  . SUBTHALAMIC STIMULATOR BATTERY REPLACEMENT N/A 11/26/2015   Procedure: Implantable pulse generator battery change for deep brain stimulator;  Surgeon: Erline Levine, MD;  Location: [facility];  Service: Neurosurgery;  Laterality: N/A;  . VAGINAL HYSTERECTOMY     "partial"  . WRIST SURGERY Right    after fx    Chief Complaint  Patient presents with  . Aphasia      HPI:    Courtney Tucker  is a 74 y.o. female with a history of advanced parkinson's disease status post deep brain neurostimulator placement, history of pseudobulbar affect and paralysis agitans,  anxiety, depression, hypertension, hyperlipidemia, history of prior TIA with chronic dysarthria and possibly chronic right-sided facial droop presenting from [facility] with concerns for confusion and disorientation --- EMS reports pt resident of [facility] and has history of stroke.  Staff says pt was acting  unusual at breakfast this morning.  Around 9am, staff says pt's speech is more slurred than usual and pt leaning more to the right today.  Appears to have r sided facial droop and c/o left leg pain.   -Telemetry neurology consult appreciated, -Patient speech is difficult to understand and she speaks very softly, very quietly --- -  In ED UA is not suggestive of UTI -Potassium is 3.4 glucose is 129 with a creatinine of 0.75 -  WBC 5.7 hemoglobin 15.1 platelets 269 UDS pending -Covid 19 negative this time (patient had Covid back in December 2020) -CTA head and neck without LVO CT head without contrast and CT cerebral perfusion without acute stroke - -Telemetry neurologist recommends inpatient neuro work-up  Review of systems:    In addition to the HPI above,   A full Review of  Systems was done, all other systems reviewed are negative except as noted above in HPI , .   Social History:  Reviewed by me    Social History   Tobacco Use  . Smoking status: Former Smoker    Packs/day: 0.50    Years: 25.00    Pack years: 12.50    Types: Cigarettes    Quit date: 03/01/2019    Years since quitting: 0.8  . Smokeless tobacco: Never Used  Substance Use Topics  . Alcohol use: Yes    Comment: 1/2 glass wine rarely     Family History :  Reviewed by me    Family History  Problem Relation Age of Onset  . Colon cancer Paternal Uncle      Home Medications:   Prior to Admission medications   Medication Sig Start Date End Date Taking? Authorizing Provider  acetaminophen (TYLENOL) 500 MG tablet Take 500 mg by mouth every 6 (six) hours as needed for mild pain or moderate pain.    [provider]  ADVAIR [facility] 381-82 MCG/ACT inhaler Inhale 2 puffs into the lungs 2 (two) times daily. 08/23/19   [provider]  albuterol (VENTOLIN HFA) 108 (90 Base) MCG/ACT inhaler Inhale 2 puffs into the lungs every 6 (six) hours as needed for wheezing or shortness of breath. 05/02/19   Milton Ferguson, MD  ALPRAZolam Duanne Moron) 0.5 MG tablet Take 0.5 mg by mouth 2 (two) times daily as needed. 10/06/19   [provider]  amLODipine (NORVASC) 10 MG tablet Take 10 mg by mouth daily.     [provider]  BREZTRI AEROSPHERE 160-9-4.8 MCG/ACT AERO  09/22/19   [provider]  Carbidopa-Levodopa ER (SINEMET CR) 25-100 MG tablet controlled release Take 1 tablet by mouth every morning. 04/04/19 10/10/19  Manuella Ghazi, Pratik D, DO  Cholecalciferol (VITAMIN D) 50 MCG (2000 UT) tablet Take 2,000 Units by mouth daily.    [provider]  clopidogrel (PLAVIX) 75 MG tablet Take 75 mg by mouth daily.    [provider]  DULoxetine (CYMBALTA) 60 MG capsule Take 60 mg by mouth 2 (two) times daily.     [provider]  HYDROcodone-acetaminophen (NORCO/VICODIN) 5-325 MG tablet Take 1 tablet by mouth every 4 (four) hours as needed. 01/03/20   Evalee Jefferson, PA-C  losartan (COZAAR) 100 MG tablet Take 100 mg by mouth daily.    [provider]  omeprazole (PRILOSEC) 20 MG capsule Take 20 mg by mouth daily.    [provider]  oxybutynin (DITROPAN) 5 MG tablet Take 5 mg by mouth 2 (two) times daily.    [provider]  pramipexole (MIRAPEX) 1 MG tablet Take 1.5 mg by mouth at bedtime.  06/18/16   [provider]  traMADol (ULTRAM) 50 MG tablet Take 50 mg by mouth 3 (three) times daily as needed. 09/26/19   [provider]  traZODone (DESYREL) 100 MG tablet Take 100 mg by mouth at bedtime.    [provider]     Allergies:     Allergies  Allergen Reactions  . Oxycodone Other (See Comments)  Can tolerate low dose mixed with tynlelol HALLUCINATIONS   . Aspirin Nausea And Vomiting and Other (See Comments)    Severe stomach pain   . Codeine Nausea Only    Makes her feel very sick      Physical Exam:   Vitals  Blood pressure (!) 146/77, pulse 84, temperature (!) 97.5 F (36.4 C), temperature source Oral, resp.  rate (!) 21, height 5\' 2"  (1.575 m), weight 83 kg, SpO2 100 %.  Physical Examination: General appearance -in no acute distress  mental status -confused and disoriented  eyes - sclera anicteric Neck - supple, no JVD elevation , Chest - clear  to auscultation bilaterally, symmetrical air movement,  Heart - S1 and S2 normal, regular  Abdomen - soft, nontender, nondistended, no masses or organomegaly Neurological -facial droop, hypophonia, dysarthria, bilateral lower extremity weakness left more than right, left upper and left lower extremity numbness, tremors Extremities - no pedal edema noted, intact peripheral pulses  Skin - warm, dry   Data Review:    CBC Recent Labs  Lab 01/22/20 1055  WBC 5.7  HGB 15.1*  HCT 45.1  PLT 269  MCV 94.4  MCH 31.6  MCHC 33.5  RDW 12.3  LYMPHSABS 2.1  MONOABS 0.4  EOSABS 0.1  BASOSABS 0.0    Chemistries  Recent Labs  Lab 01/22/20 1055  NA 139  K 3.4*  CL 102  CO2 28  GLUCOSE 129*  BUN 13  CREATININE 0.75  CALCIUM 9.0  AST 17  ALT 7  ALKPHOS 112  BILITOT 0.6   estimated creatinine clearance is 62.6 mL/min (by C-G formula based on SCr of 0.75 mg/dL). ------------------------------------------------------------------------------------------------------------------ No results for input(s): TSH, T4TOTAL, T3FREE, THYROIDAB in the last 72 hours.  Invalid input(s): FREET3   Coagulation profile Recent Labs  Lab 01/22/20 1240  INR 1.0   ------------------------------------------------------------------------------------------------------------------- No results for input(s): DDIMER in the last 72 hours.  Cardiac Enzymes No results for input(s): CKMB, TROPONINI, MYOGLOBIN in the last 168 hours.  Invalid input(s): CK ------------------------------------------------------------------------------------------------------------------ No results found for: BNP  Urinalysis    Component Value Date/Time   COLORURINE YELLOW  03/29/2019 [place] 03/29/2019 1433   LABSPEC 1.018 03/29/2019 1433   PHURINE 5.0 03/29/2019 1433   GLUCOSEU NEGATIVE 03/29/2019 1433   HGBUR NEGATIVE 03/29/2019 1433   BILIRUBINUR NEGATIVE 03/29/2019 1433   KETONESUR NEGATIVE 03/29/2019 1433   PROTEINUR NEGATIVE 03/29/2019 1433   UROBILINOGEN 0.2 01/03/2013 2125   NITRITE NEGATIVE 03/29/2019 1433   LEUKOCYTESUR NEGATIVE 03/29/2019 1433     Imaging Results:    CT Head Wo Contrast  Result Date: 01/22/2020 CLINICAL DATA:  Abnormal speech EXAM: CT HEAD WITHOUT CONTRAST TECHNIQUE: Contiguous axial images were obtained from the base of the skull through the vertex without intravenous contrast. COMPARISON:  03/29/2019 FINDINGS: Brain: There is significant streak artifact associated with deep brain stimulator leads within the right scalp soft tissues extending to the vertex and terminating in the subthalamic regions bilaterally. Within this limitation, there is no acute intracranial hemorrhage or new loss of gray-white differentiation. Ventricles are stable in size. Patchy hypoattenuation in the supratentorial white matter is nonspecific but probably reflects stable chronic microvascular ischemic changes. Vascular: There is mild atherosclerotic calcification at the skull base. Skull: Bilateral burr holes at the vertex. Sinuses/Orbits: No acute finding. Other: None. IMPRESSION: No acute intracranial abnormality.  Chronic findings detailed above. Electronically Signed   By: Macy Mis M.D.   On: 01/22/2020 11:16   CT  CEREBRAL PERFUSION W CONTRAST  Result Date: 01/22/2020 CLINICAL DATA:  Left-sided weakness EXAM: CT ANGIOGRAPHY HEAD AND NECK CT PERFUSION BRAIN TECHNIQUE: Multidetector CT imaging of the head and neck was performed using the standard protocol during bolus administration of intravenous contrast. Multiplanar CT image reconstructions and MIPs were obtained to evaluate the vascular anatomy. Carotid stenosis measurements  (when applicable) are obtained utilizing NASCET criteria, using the distal internal carotid diameter as the denominator. Multiphase CT imaging of the brain was performed following IV bolus contrast injection. Subsequent parametric perfusion maps were calculated using RAPID software. CONTRAST:  142mL OMNIPAQUE IOHEXOL 350 MG/ML SOLN COMPARISON:  None. FINDINGS: Streak artifact from deep brain stimulator generator and leads, dental amalgam, and cervical spine fusion hardware. CTA NECK FINDINGS Aortic arch: Great vessel origins are patent. Right carotid system: Patent. Mild calcified plaque at the ICA origin without measurable stenosis. Left carotid system: Patent. No measurable stenosis at the ICA origin. Vertebral arteries: Patent.  Left vertebral artery is dominant. Skeleton: Multilevel cervical spine degenerative changes. There are postoperative changes of prior anterior fusion at C5-C7. Other neck: No mass or adenopathy. Upper chest: Retained debris within the superior soft esophagus. No apical lung mass. Review of the MIP images confirms the above findings CTA HEAD FINDINGS Anterior circulation: Intracranial internal carotid arteries are patent with minimal calcified plaque. Anterior and middle cerebral arteries are patent. Posterior circulation: Intracranial vertebral arteries are patent. Right vertebral artery is diminutive after PICA origin. There is calcified plaque likely causing moderate stenosis with limited evaluation due to small size of vessel and streak artifact. Calcified plaque causes mild stenosis of the left vertebral artery. Venous sinuses: As permitted by contrast timing, patent. Review of the MIP images confirms the above findings CT Brain Perfusion Findings: CBF (<30%) Volume: 69mL Perfusion (Tmax>6.0s) volume: 2mL Mismatch Volume: 52mL Infarction Location: None. IMPRESSION: No large vessel occlusion or hemodynamically significant stenosis. Perfusion imaging demonstrates no evidence of core  infarction or territory at risk. These results were called by telephone at the time of interpretation on 01/22/2020 at 12:13 pm to provider Davonna Belling , who verbally acknowledged these results. Electronically Signed   By: Macy Mis M.D.   On: 01/22/2020 12:17   CT ANGIO HEAD CODE STROKE  Result Date: 01/22/2020 CLINICAL DATA:  Left-sided weakness EXAM: CT ANGIOGRAPHY HEAD AND NECK CT PERFUSION BRAIN TECHNIQUE: Multidetector CT imaging of the head and neck was performed using the standard protocol during bolus administration of intravenous contrast. Multiplanar CT image reconstructions and MIPs were obtained to evaluate the vascular anatomy. Carotid stenosis measurements (when applicable) are obtained utilizing NASCET criteria, using the distal internal carotid diameter as the denominator. Multiphase CT imaging of the brain was performed following IV bolus contrast injection. Subsequent parametric perfusion maps were calculated using RAPID software. CONTRAST:  142mL OMNIPAQUE IOHEXOL 350 MG/ML SOLN COMPARISON:  None. FINDINGS: Streak artifact from deep brain stimulator generator and leads, dental amalgam, and cervical spine fusion hardware. CTA NECK FINDINGS Aortic arch: Great vessel origins are patent. Right carotid system: Patent. Mild calcified plaque at the ICA origin without measurable stenosis. Left carotid system: Patent. No measurable stenosis at the ICA origin. Vertebral arteries: Patent.  Left vertebral artery is dominant. Skeleton: Multilevel cervical spine degenerative changes. There are postoperative changes of prior anterior fusion at C5-C7. Other neck: No mass or adenopathy. Upper chest: Retained debris within the superior soft esophagus. No apical lung mass. Review of the MIP images confirms the above findings CTA HEAD FINDINGS Anterior circulation: Intracranial  internal carotid arteries are patent with minimal calcified plaque. Anterior and middle cerebral arteries are patent. Posterior  circulation: Intracranial vertebral arteries are patent. Right vertebral artery is diminutive after PICA origin. There is calcified plaque likely causing moderate stenosis with limited evaluation due to small size of vessel and streak artifact. Calcified plaque causes mild stenosis of the left vertebral artery. Venous sinuses: As permitted by contrast timing, patent. Review of the MIP images confirms the above findings CT Brain Perfusion Findings: CBF (<30%) Volume: 28mL Perfusion (Tmax>6.0s) volume: 11mL Mismatch Volume: 67mL Infarction Location: None. IMPRESSION: No large vessel occlusion or hemodynamically significant stenosis. Perfusion imaging demonstrates no evidence of core infarction or territory at risk. These results were called by telephone at the time of interpretation on 01/22/2020 at 12:13 pm to provider Davonna Belling , who verbally acknowledged these results. Electronically Signed   By: Macy Mis M.D.   On: 01/22/2020 12:17   CT ANGIO NECK CODE STROKE  Result Date: 01/22/2020 CLINICAL DATA:  Left-sided weakness EXAM: CT ANGIOGRAPHY HEAD AND NECK CT PERFUSION BRAIN TECHNIQUE: Multidetector CT imaging of the head and neck was performed using the standard protocol during bolus administration of intravenous contrast. Multiplanar CT image reconstructions and MIPs were obtained to evaluate the vascular anatomy. Carotid stenosis measurements (when applicable) are obtained utilizing NASCET criteria, using the distal internal carotid diameter as the denominator. Multiphase CT imaging of the brain was performed following IV bolus contrast injection. Subsequent parametric perfusion maps were calculated using RAPID software. CONTRAST:  160mL OMNIPAQUE IOHEXOL 350 MG/ML SOLN COMPARISON:  None. FINDINGS: Streak artifact from deep brain stimulator generator and leads, dental amalgam, and cervical spine fusion hardware. CTA NECK FINDINGS Aortic arch: Great vessel origins are patent. Right carotid system:  Patent. Mild calcified plaque at the ICA origin without measurable stenosis. Left carotid system: Patent. No measurable stenosis at the ICA origin. Vertebral arteries: Patent.  Left vertebral artery is dominant. Skeleton: Multilevel cervical spine degenerative changes. There are postoperative changes of prior anterior fusion at C5-C7. Other neck: No mass or adenopathy. Upper chest: Retained debris within the superior soft esophagus. No apical lung mass. Review of the MIP images confirms the above findings CTA HEAD FINDINGS Anterior circulation: Intracranial internal carotid arteries are patent with minimal calcified plaque. Anterior and middle cerebral arteries are patent. Posterior circulation: Intracranial vertebral arteries are patent. Right vertebral artery is diminutive after PICA origin. There is calcified plaque likely causing moderate stenosis with limited evaluation due to small size of vessel and streak artifact. Calcified plaque causes mild stenosis of the left vertebral artery. Venous sinuses: As permitted by contrast timing, patent. Review of the MIP images confirms the above findings CT Brain Perfusion Findings: CBF (<30%) Volume: 61mL Perfusion (Tmax>6.0s) volume: 70mL Mismatch Volume: 71mL Infarction Location: None. IMPRESSION: No large vessel occlusion or hemodynamically significant stenosis. Perfusion imaging demonstrates no evidence of core infarction or territory at risk. These results were called by telephone at the time of interpretation on 01/22/2020 at 12:13 pm to provider Davonna Belling , who verbally acknowledged these results. Electronically Signed   By: Macy Mis M.D.   On: 01/22/2020 12:17    Radiological Exams on Admission: CT Head Wo Contrast  Result Date: 01/22/2020 CLINICAL DATA:  Abnormal speech EXAM: CT HEAD WITHOUT CONTRAST TECHNIQUE: Contiguous axial images were obtained from the base of the skull through the vertex without intravenous contrast. COMPARISON:  03/29/2019  FINDINGS: Brain: There is significant streak artifact associated with deep brain stimulator leads within the  right scalp soft tissues extending to the vertex and terminating in the subthalamic regions bilaterally. Within this limitation, there is no acute intracranial hemorrhage or new loss of gray-white differentiation. Ventricles are stable in size. Patchy hypoattenuation in the supratentorial white matter is nonspecific but probably reflects stable chronic microvascular ischemic changes. Vascular: There is mild atherosclerotic calcification at the skull base. Skull: Bilateral burr holes at the vertex. Sinuses/Orbits: No acute finding. Other: None. IMPRESSION: No acute intracranial abnormality.  Chronic findings detailed above. Electronically Signed   By: Macy Mis M.D.   On: 01/22/2020 11:16   CT CEREBRAL PERFUSION W CONTRAST  Result Date: 01/22/2020 CLINICAL DATA:  Left-sided weakness EXAM: CT ANGIOGRAPHY HEAD AND NECK CT PERFUSION BRAIN TECHNIQUE: Multidetector CT imaging of the head and neck was performed using the standard protocol during bolus administration of intravenous contrast. Multiplanar CT image reconstructions and MIPs were obtained to evaluate the vascular anatomy. Carotid stenosis measurements (when applicable) are obtained utilizing NASCET criteria, using the distal internal carotid diameter as the denominator. Multiphase CT imaging of the brain was performed following IV bolus contrast injection. Subsequent parametric perfusion maps were calculated using RAPID software. CONTRAST:  132mL OMNIPAQUE IOHEXOL 350 MG/ML SOLN COMPARISON:  None. FINDINGS: Streak artifact from deep brain stimulator generator and leads, dental amalgam, and cervical spine fusion hardware. CTA NECK FINDINGS Aortic arch: Great vessel origins are patent. Right carotid system: Patent. Mild calcified plaque at the ICA origin without measurable stenosis. Left carotid system: Patent. No measurable stenosis at the ICA  origin. Vertebral arteries: Patent.  Left vertebral artery is dominant. Skeleton: Multilevel cervical spine degenerative changes. There are postoperative changes of prior anterior fusion at C5-C7. Other neck: No mass or adenopathy. Upper chest: Retained debris within the superior soft esophagus. No apical lung mass. Review of the MIP images confirms the above findings CTA HEAD FINDINGS Anterior circulation: Intracranial internal carotid arteries are patent with minimal calcified plaque. Anterior and middle cerebral arteries are patent. Posterior circulation: Intracranial vertebral arteries are patent. Right vertebral artery is diminutive after PICA origin. There is calcified plaque likely causing moderate stenosis with limited evaluation due to small size of vessel and streak artifact. Calcified plaque causes mild stenosis of the left vertebral artery. Venous sinuses: As permitted by contrast timing, patent. Review of the MIP images confirms the above findings CT Brain Perfusion Findings: CBF (<30%) Volume: 54mL Perfusion (Tmax>6.0s) volume: 61mL Mismatch Volume: 58mL Infarction Location: None. IMPRESSION: No large vessel occlusion or hemodynamically significant stenosis. Perfusion imaging demonstrates no evidence of core infarction or territory at risk. These results were called by telephone at the time of interpretation on 01/22/2020 at 12:13 pm to provider Davonna Belling , who verbally acknowledged these results. Electronically Signed   By: Macy Mis M.D.   On: 01/22/2020 12:17   CT ANGIO HEAD CODE STROKE  Result Date: 01/22/2020 CLINICAL DATA:  Left-sided weakness EXAM: CT ANGIOGRAPHY HEAD AND NECK CT PERFUSION BRAIN TECHNIQUE: Multidetector CT imaging of the head and neck was performed using the standard protocol during bolus administration of intravenous contrast. Multiplanar CT image reconstructions and MIPs were obtained to evaluate the vascular anatomy. Carotid stenosis measurements (when applicable)  are obtained utilizing NASCET criteria, using the distal internal carotid diameter as the denominator. Multiphase CT imaging of the brain was performed following IV bolus contrast injection. Subsequent parametric perfusion maps were calculated using RAPID software. CONTRAST:  119mL OMNIPAQUE IOHEXOL 350 MG/ML SOLN COMPARISON:  None. FINDINGS: Streak artifact from deep brain stimulator generator and  leads, dental amalgam, and cervical spine fusion hardware. CTA NECK FINDINGS Aortic arch: Great vessel origins are patent. Right carotid system: Patent. Mild calcified plaque at the ICA origin without measurable stenosis. Left carotid system: Patent. No measurable stenosis at the ICA origin. Vertebral arteries: Patent.  Left vertebral artery is dominant. Skeleton: Multilevel cervical spine degenerative changes. There are postoperative changes of prior anterior fusion at C5-C7. Other neck: No mass or adenopathy. Upper chest: Retained debris within the superior soft esophagus. No apical lung mass. Review of the MIP images confirms the above findings CTA HEAD FINDINGS Anterior circulation: Intracranial internal carotid arteries are patent with minimal calcified plaque. Anterior and middle cerebral arteries are patent. Posterior circulation: Intracranial vertebral arteries are patent. Right vertebral artery is diminutive after PICA origin. There is calcified plaque likely causing moderate stenosis with limited evaluation due to small size of vessel and streak artifact. Calcified plaque causes mild stenosis of the left vertebral artery. Venous sinuses: As permitted by contrast timing, patent. Review of the MIP images confirms the above findings CT Brain Perfusion Findings: CBF (<30%) Volume: 4mL Perfusion (Tmax>6.0s) volume: 18mL Mismatch Volume: 32mL Infarction Location: None. IMPRESSION: No large vessel occlusion or hemodynamically significant stenosis. Perfusion imaging demonstrates no evidence of core infarction or territory  at risk. These results were called by telephone at the time of interpretation on 01/22/2020 at 12:13 pm to provider Davonna Belling , who verbally acknowledged these results. Electronically Signed   By: Macy Mis M.D.   On: 01/22/2020 12:17   CT ANGIO NECK CODE STROKE  Result Date: 01/22/2020 CLINICAL DATA:  Left-sided weakness EXAM: CT ANGIOGRAPHY HEAD AND NECK CT PERFUSION BRAIN TECHNIQUE: Multidetector CT imaging of the head and neck was performed using the standard protocol during bolus administration of intravenous contrast. Multiplanar CT image reconstructions and MIPs were obtained to evaluate the vascular anatomy. Carotid stenosis measurements (when applicable) are obtained utilizing NASCET criteria, using the distal internal carotid diameter as the denominator. Multiphase CT imaging of the brain was performed following IV bolus contrast injection. Subsequent parametric perfusion maps were calculated using RAPID software. CONTRAST:  173mL OMNIPAQUE IOHEXOL 350 MG/ML SOLN COMPARISON:  None. FINDINGS: Streak artifact from deep brain stimulator generator and leads, dental amalgam, and cervical spine fusion hardware. CTA NECK FINDINGS Aortic arch: Great vessel origins are patent. Right carotid system: Patent. Mild calcified plaque at the ICA origin without measurable stenosis. Left carotid system: Patent. No measurable stenosis at the ICA origin. Vertebral arteries: Patent.  Left vertebral artery is dominant. Skeleton: Multilevel cervical spine degenerative changes. There are postoperative changes of prior anterior fusion at C5-C7. Other neck: No mass or adenopathy. Upper chest: Retained debris within the superior soft esophagus. No apical lung mass. Review of the MIP images confirms the above findings CTA HEAD FINDINGS Anterior circulation: Intracranial internal carotid arteries are patent with minimal calcified plaque. Anterior and middle cerebral arteries are patent. Posterior circulation:  Intracranial vertebral arteries are patent. Right vertebral artery is diminutive after PICA origin. There is calcified plaque likely causing moderate stenosis with limited evaluation due to small size of vessel and streak artifact. Calcified plaque causes mild stenosis of the left vertebral artery. Venous sinuses: As permitted by contrast timing, patent. Review of the MIP images confirms the above findings CT Brain Perfusion Findings: CBF (<30%) Volume: 68mL Perfusion (Tmax>6.0s) volume: 32mL Mismatch Volume: 17mL Infarction Location: None. IMPRESSION: No large vessel occlusion or hemodynamically significant stenosis. Perfusion imaging demonstrates no evidence of core infarction or territory at risk.  These results were called by telephone at the time of interpretation on 01/22/2020 at 12:13 pm to provider Davonna Belling , who verbally acknowledged these results. Electronically Signed   By: Macy Mis M.D.   On: 01/22/2020 12:17   DVT Prophylaxis -SCD /Heparin AM Labs Ordered, also please review Full Orders  Family Communication: Admission, patients condition and plan of care including tests being ordered have been discussed with the patient  who indicate understanding and agree with the plan   Code Status - Full Code  Likely DC to back to [facility] after neuro work-up  Condition   stable  Roxan Hockey M.D on 01/22/2020 at 1:43 PM Go to [email or web address] -  for contact info  [facility] - Office  [phone]

## 2020-01-22 NOTE — Consult Note (Signed)
Clinical/Bedside Swallow Evaluation Patient Details  Name: Courtney Tucker MRN: [id number] Date of Birth: [birth year]  Today's Date: 01/22/2020 Time: SLP Start Time (ACUTE ONLY): 2956 SLP Stop Time (ACUTE ONLY): 1427 SLP Time Calculation (min) (ACUTE ONLY): 28 min  Past Medical History:  Past Medical History:  Diagnosis Date  . Anxiety   . Arthritis   . Complication of anesthesia   . Depression   . Depression   . Dysphasia   . GERD (gastroesophageal reflux disease)   . Hyperlipidemia   . Hypertension   . Parkinson's disease ([place])    diagnosed at age 43  . PONV (postoperative nausea and vomiting)    states it has been a long time since getting sick  . Reflux   . Sleep apnea    Has CPAP but doesnt use  . Stroke ([place])   . TIA (transient ischemic attack)    not really TIA but abnormal MRI per pt left sided weakness  . UTI (lower urinary tract infection)   . Vitamin D deficiency    Past Surgical History:  Past Surgical History:  Procedure Laterality Date  . BACK SURGERY    . Battery change     DBS, 12/2009  . BIOPSY N/A 11/29/2013   Procedure: GASTRIC BIOPSY;  Surgeon: Danie Binder, MD;  Location: [facility];  Service: Endoscopy;  Laterality: N/A;  . BURR HOLE W/ STEREOTACTIC INSERTION OF DBS LEADS / INTRAOP MICROELECTRODE RECORDING     2007  . CARPAL TUNNEL RELEASE Right   . COLONOSCOPY WITH PROPOFOL N/A 11/29/2013   Procedure: COLONOSCOPY WITH PROPOFOL;  Surgeon: Danie Binder, MD;  Location: [facility];  Service: Endoscopy;  Laterality: N/A;  entered cecum @ [phone]; total cecal withdrawal time 21 minutes   . ELBOW SURGERY Left    after fx  . ESOPHAGEAL DILATION N/A 11/29/2013   Procedure: ESOPHAGEAL DILATION;  Surgeon: Danie Binder, MD;  Location: [facility];  Service: Endoscopy;  Laterality: N/A;  [place] 14/15/16  . ESOPHAGOGASTRODUODENOSCOPY (EGD) WITH PROPOFOL N/A 11/29/2013   Procedure: ESOPHAGOGASTRODUODENOSCOPY (EGD) WITH PROPOFOL;  Surgeon: Danie Binder, MD;  Location: [facility];  Service: Endoscopy;  Laterality: N/A;  . FOOT SURGERY Left    "something with 2nd 2."  . NECK SURGERY     Fusion  . POLYPECTOMY N/A 11/29/2013   Procedure: POLYPECTOMY;  Surgeon: Danie Binder, MD;  Location: [facility];  Service: Endoscopy;  Laterality: N/A;  Distal Transverse Colon x2   . PR ANALYZE NEUROSTIM BRAIN, FIRST 1H  10/15/2012      . PULSE GENERATOR IMPLANT Right 03/30/2018   Procedure: Right Implantable pulse generator change;  Surgeon: Erline Levine, MD;  Location: [place] [facility];  Service: Neurosurgery;  Laterality: Right;  . SUBTHALAMIC STIMULATOR BATTERY REPLACEMENT N/A 08/05/2013   Procedure: SUBTHALAMIC STIMULATOR BATTERY REPLACEMENT;  Surgeon: Erline Levine, MD;  Location: [place] [facility];  Service: Neurosurgery;  Laterality: N/A;  . SUBTHALAMIC STIMULATOR BATTERY REPLACEMENT N/A 11/26/2015   Procedure: Implantable pulse generator battery change for deep brain stimulator;  Surgeon: Erline Levine, MD;  Location: [facility];  Service: Neurosurgery;  Laterality: N/A;  . VAGINAL HYSTERECTOMY     "partial"  . WRIST SURGERY Right    after fx   HPI:  74 y.o. lady with a pmh of advanced Parkinson's disease s/p DBS placement, baseline severe dysarthria, previous stroke and TIA with baseline left sided weakness per chart review (of note also has a R AFO in place), HTN, HLD, Depression,  Anxiety, and other medical issues who presents to the ER with symptoms of worsened left sided weakness and slurred speech. Neurology exam shows marked hypophonia, dysarthria, mild LUE weakness and b/l LE weakness that more severely affects the LLE. There is LUE and LLE numbness to LT. She also has a R AFO in place.  CT head indicated no large vessel occlusion or hemodynamically significant stenosis; perfusion imaging demonstrates no evidence of core infarction or territory at risk; Covid test pending.  Assessment / Plan / Recommendation Clinical Impression  Pt presents with mild oropharyngeal dysphagia  characterized by decreased oral transit/manipulation and impaired mastication with various boluses including: ice chips, thin via tsp/straw (small sips), puree and soft solids.  She did hold puree/ice chips prior to swallowing with a delayed onset of the swallow noted with each presentation.  Pt exhibited a low vocal intensity throughout evaluation with verbal cues (mod) to increase breath support while speaking and using shorter phrases vs sentences d/t decreased intensity.  Pt stated she "gets strangled on meats from time to time" during intake.  R oral lingual/labial weakness/coordination noted during OME.  Recommend initiating a Dysphagia 2 (minced)/thin liquids with small sips/bites, effortful swallow and slow rate with ST f/u for diet tolerance and education re: aspiration/swallowing precautions while in acute setting.  Thank you for this consult. SLP Visit Diagnosis: Dysphagia, oropharyngeal phase (R13.12)    Aspiration Risk  Mild aspiration risk    Diet Recommendation   Dysphagia 2(minced)/thin liquids  Medication Administration: Crushed with puree    Other  Recommendations Oral Care Recommendations: Oral care BID   Follow up Recommendations Skilled Nursing facility      Frequency and Duration min 1 x/week  1 week       Prognosis Prognosis for Safe Diet Advancement: Good      Swallow Study   General Date of Onset: 01/22/20 HPI: Possible TIA in the setting of advanced parkinsonism Type of Study: Bedside Swallow Evaluation Previous Swallow Assessment: MBS in 2016 with mild oropharyngeal dysphagia noted with penetration above vocal folds cleared; chin tuck eliminated this occurrence Diet Prior to this Study: NPO Temperature Spikes Noted: No Respiratory Status: Nasal cannula (2L) History of Recent Intubation: No Behavior/Cognition: Alert;Cooperative Oral Cavity Assessment: Within Functional Limits Oral Care Completed by SLP: No Oral Cavity - Dentition: Missing  dentition Vision: Functional for self-feeding Self-Feeding Abilities: Able to feed self;Needs assist Patient Positioning: Upright in bed Baseline Vocal Quality: Low vocal intensity Volitional Cough: Weak Volitional Swallow: Able to elicit    Oral/Motor/Sensory Function Overall Oral Motor/Sensory Function: Mild impairment Facial ROM: Reduced right Facial Symmetry: Abnormal symmetry right Facial Strength: Reduced right Lingual ROM: Within Functional Limits Lingual Symmetry: Abnormal symmetry right Lingual Strength: Reduced Velum: Within Functional Limits Mandible: Within Functional Limits   Ice Chips Ice chips: Impaired Presentation: Spoon Oral Phase Impairments: Reduced lingual movement/coordination Oral Phase Functional Implications: Prolonged oral transit Pharyngeal Phase Impairments: Suspected delayed Swallow   Thin Liquid Thin Liquid: Impaired Presentation: Spoon;Straw Oral Phase Impairments: Reduced lingual movement/coordination Oral Phase Functional Implications: Oral holding Pharyngeal  Phase Impairments: Suspected delayed Swallow    Nectar Thick Nectar Thick Liquid: Not tested   Honey Thick Honey Thick Liquid: Not tested   Puree Puree: Impaired Presentation: Spoon Oral Phase Impairments: Reduced lingual movement/coordination Oral Phase Functional Implications: Prolonged oral transit Pharyngeal Phase Impairments: Suspected delayed Swallow   Solid     Solid: Impaired Presentation: Spoon Oral Phase Impairments: Reduced lingual movement/coordination;Impaired mastication Oral Phase Functional Implications: Prolonged oral transit;Impaired  mastication Pharyngeal Phase Impairments: Suspected delayed Swallow      Elvina Sidle, M.S., CCC-SLP 01/22/2020,2:56 PM

## 2020-01-23 ENCOUNTER — Observation Stay (HOSPITAL_BASED_OUTPATIENT_CLINIC_OR_DEPARTMENT_OTHER): Payer: Medicare Other

## 2020-01-23 DIAGNOSIS — G459 Transient cerebral ischemic attack, unspecified: Secondary | ICD-10-CM

## 2020-01-23 DIAGNOSIS — G9341 Metabolic encephalopathy: Secondary | ICD-10-CM | POA: Diagnosis not present

## 2020-01-23 LAB — GLUCOSE, CAPILLARY
Glucose-Capillary: 113 mg/dL — ABNORMAL HIGH (ref 70–99)
Glucose-Capillary: 115 mg/dL — ABNORMAL HIGH (ref 70–99)
Glucose-Capillary: 119 mg/dL — ABNORMAL HIGH (ref 70–99)

## 2020-01-23 LAB — LIPID PANEL
Cholesterol: 146 mg/dL (ref 0–200)
HDL: 37 mg/dL — ABNORMAL LOW (ref >40)
LDL Cholesterol: 73 mg/dL (ref 0–99)
Total CHOL/HDL Ratio: 3.9 RATIO
Triglycerides: 179 mg/dL — ABNORMAL HIGH (ref <150)
VLDL: 36 mg/dL (ref 0–40)

## 2020-01-23 LAB — BASIC METABOLIC PANEL
Anion gap: 8 (ref 5–15)
BUN: 13 mg/dL (ref 8–23)
CO2: 28 mmol/L (ref 22–32)
Calcium: 8.9 mg/dL (ref 8.9–10.3)
Chloride: 102 mmol/L (ref 98–111)
Creatinine, Ser: 0.77 mg/dL (ref 0.44–1.00)
GFR calc Af Amer: >60 mL/min (ref >60)
GFR calc non Af Amer: >60 mL/min (ref >60)
Glucose, Bld: 122 mg/dL — ABNORMAL HIGH (ref 70–99)
Potassium: 3.5 mmol/L (ref 3.5–5.1)
Sodium: 138 mmol/L (ref 135–145)

## 2020-01-23 LAB — ECHOCARDIOGRAM COMPLETE
AR max vel: 2.43 cm2
AV Area VTI: 2.41 cm2
AV Area mean vel: 2.16 cm2
AV Mean grad: 4.2 mmHg
AV Peak grad: 7.2 mmHg
Ao pk vel: 1.34 m/s
Area-P 1/2: 3.17 cm2
Height: 62 in
S' Lateral: 1.89 cm
Weight: 2853.63 oz

## 2020-01-23 LAB — CBC
HCT: 44 % (ref 36.0–46.0)
Hemoglobin: 14.5 g/dL (ref 12.0–15.0)
MCH: 31 pg (ref 26.0–34.0)
MCHC: 33 g/dL (ref 30.0–36.0)
MCV: 94.2 fL (ref 80.0–100.0)
Platelets: 254 10*3/uL (ref 150–400)
RBC: 4.67 MIL/uL (ref 3.87–5.11)
RDW: 12.3 % (ref 11.5–15.5)
WBC: 6.6 10*3/uL (ref 4.0–10.5)
nRBC: 0 % (ref 0.0–0.2)

## 2020-01-23 LAB — MRSA PCR SCREENING: MRSA by PCR: NEGATIVE

## 2020-01-23 MED ORDER — HYDROCODONE-ACETAMINOPHEN 5-325 MG PO TABS
1.0000 | ORAL_TABLET | Freq: Four times a day (QID) | ORAL | 0 refills | Status: DC | PRN
Start: 2020-01-23 — End: 2020-07-23

## 2020-01-23 MED ORDER — ATORVASTATIN CALCIUM 40 MG PO TABS
40.0000 mg | ORAL_TABLET | Freq: Every day | ORAL | 3 refills | Status: DC
Start: 2020-01-24 — End: 2021-09-24

## 2020-01-23 MED ORDER — ALPRAZOLAM 0.5 MG PO TABS: 0.5000 mg | ORAL_TABLET | Freq: Two times a day (BID) | ORAL | 0 refills | Status: DC | PRN

## 2020-01-23 NOTE — Evaluation (Signed)
Occupational Therapy Evaluation Patient Details Name: Courtney Tucker MRN: [id number] DOB: [birth year] Today's Date: 01/23/2020    History of Present Illness Courtney Tucker  is a 74 y.o. 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 Patient present at baseline for basic ADL tasks. She states that she receives assist from ALF staff for bathing and dressing. Recommend that she return to ALF and staff OT may evaluate any need for additional therapy services. Will sign off.     Follow Up Recommendations  Other (comment) (Return to [facility])    Equipment Recommendations  None recommended by OT       Precautions / Restrictions Precautions Precautions: Fall Precaution Comments: right LE AFO Restrictions Weight Bearing Restrictions: No      Mobility Bed Mobility Overal bed mobility: Needs Assistance (See PT evaluation)    Transfers Overall transfer level: Needs assistance     General transfer comment: See PT evaluation    Balance Overall balance assessment: History of Falls      ADL either performed or assessed with clinical judgement   ADL Overall ADL's : At baseline     General ADL Comments: Pt requires assistance for all ADL tasks.     Vision Baseline Vision/History: Wears glasses Patient Visual Report: No change from baseline              Pertinent Vitals/Pain Pain Assessment: 0-10 Pain Score: 7  Pain Location: leg leg Pain Descriptors / Indicators: Aching Pain Intervention(s): Monitored during session     Hand  Dominance Right   Extremity/Trunk Assessment Upper Extremity Assessment Upper Extremity Assessment: Overall WFL for tasks assessed   Lower Extremity Assessment Lower Extremity Assessment: Defer to PT evaluation       Communication Communication Communication: Expressive difficulties   Cognition Arousal/Alertness: Awake/alert Behavior During Therapy: WFL for tasks assessed/performed Overall Cognitive Status: Within Functional Limits for tasks assessed                  Home Living Family/patient expects to be discharged to:: Assisted living   Home Equipment: Wheelchair - manual;Walker - 4 wheels   Additional Comments: Patient states she has her own wheelchair and walker, but that equipment needed for bathing and toiletting is available to assisted living facility.      Prior Functioning/Environment Level of Independence: Needs assistance  Gait / Transfers Assistance Needed: Pt reports that she only ambulates with phyical therapy staff ADL's / Homemaking Assistance Needed: Pt receives assitance from staff. Reports Moderate assitance needed for bathing and max assistance for dressing.                                   Co-evaluation PT/OT/SLP Co-Evaluation/Treatment: Yes Reason for Co-Treatment: For patient/therapist safety;To address functional/ADL transfers   OT goals addressed during session: ADL's and self-care;Strengthening/ROM      AM-PAC OT "6 Clicks" Daily Activity     Outcome Measure Help from another person eating meals?: None Help from another person taking care of personal grooming?: A Little Help from another person toileting, which includes using toliet, bedpan,  or urinal?: Total Help from another person bathing (including washing, rinsing, drying)?: A Lot Help from another person to put on and taking off regular upper body clothing?: A Little Help from another person to put on and taking off regular lower body clothing?: Total 6 Click Score:  14   End of Session Equipment Utilized During Treatment: Rolling walker Nurse Communication: Other (comment);Mobility status (need for bed linen change)  Activity Tolerance: Patient tolerated treatment well Patient left: in chair;with call bell/phone within reach;with chair alarm set;with nursing/sitter in room  OT Visit Diagnosis: History of falling (Z91.81);Muscle weakness (generalized) (M62.81)                Time: 0981-1914 OT Time Calculation (min): 35 min Charges:  OT General Charges $OT Visit: 1 Visit OT Evaluation $OT Eval Low Complexity: [place], OTR/L,CBIS  [phone]   [name], [name] 01/23/2020, 9:20 AM

## 2020-01-23 NOTE — TOC Transition Note (Signed)
Transition of Care [facility]) - CM/SW Discharge Note   Patient Details  Name: Courtney Tucker MRN: [id number] Date of Birth: July 06, 1945  Transition of Care [facility]) CM/SW Contact:  Boneta Lucks, RN Phone Number: 01/23/2020, 3:54 PM   Clinical Narrative:   Patient is medically ready to discharge back to [facility]. FL2 done and sent in the hub. Facility updated. EMS scheduled, RN calling report and medical necessity printed on 300.    Final next level of care: Assisted Living Barriers to Discharge: Barriers Resolved   Patient Goals and CMS Choice Patient states their goals for this hospitalization and ongoing recovery are:: Return to [facility]     Discharge Placement             Patient chooses bed at: Other - please specify in the comment section below: ([facility]) Patient to be transferred to facility by: EMS Name of family member notified: Patriciaann Clan  left message Patient and family notified of of transfer: 01/23/20  Discharge Plan and Services    HH Arranged: PT [facility] Agency: [name] Date [place]: 01/23/20 Time HH Agency Contacted: 1130 Representative spoke with at [place]: Cassie   Readmission Risk Interventions Readmission Risk Prevention Plan 03/30/2019  Medication Screening Complete  Transportation Screening Complete  Some recent data might be hidden

## 2020-01-23 NOTE — Evaluation (Signed)
Physical Therapy Evaluation Patient Details Name: Courtney Tucker MRN: [id number] DOB: 02-27-1946 Today's Date: 01/23/2020   History of Present Illness  Courtney Tucker  is a 74 y.o. 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  Patient can actively dorsiflex bilateral ankles, has LLE AFO, but able to take steps without using it, limited to a few slow labored unsteady steps at bedside due to generalized weakness, fatigue and tolerated sitting up in chair after therapy - RN aware.  Patient will benefit from continued physical therapy in hospital and recommended venue below to increase strength, balance, endurance for safe ADLs and gait.     Follow Up Recommendations Home health PT;Supervision for mobility/OOB;Supervision - Intermittent    Equipment Recommendations  None recommended by PT    Recommendations for Other Services       Precautions / Restrictions Precautions Precautions: Fall Precaution Comments: right LE AFO Restrictions Weight Bearing Restrictions: No      Mobility  Bed Mobility Overal bed mobility: Needs Assistance Bed Mobility: Supine to Sit;Sit to Supine     Supine to sit: Mod assist;HOB elevated Sit to supine: Mod assist   General bed mobility comments: slow labored movement  Transfers Overall transfer level: Needs assistance Equipment used: Rolling walker (2 wheeled) Transfers: Sit to/from [facility] Sit to Stand: Mod assist Stand pivot transfers: Mod assist       General transfer comment: increased time,  labored movement  Ambulation/Gait Ambulation/Gait assistance: Mod assist;Max assist Gait Distance (Feet): 4 Feet Assistive device: Rolling walker (2 wheeled) Gait Pattern/deviations: Decreased step length - right;Decreased step length - left;Decreased stride length Gait velocity: decreased   General Gait Details: limited to 4-5 slow unsteady labored steps due to generalized weakness and fatigue  Stairs            Wheelchair Mobility    Modified Rankin (Stroke Patients Only)       Balance Overall balance assessment: Needs assistance Sitting-balance support: Feet supported;No upper extremity supported Sitting balance-Leahy Scale: Fair Sitting balance - Comments: fair/good seated at EOB   Standing balance support: During functional activity;Bilateral upper extremity supported Standing balance-Leahy Scale: Poor Standing balance comment: fair/poor using RW                             Pertinent Vitals/Pain Pain Assessment: 0-10 Pain Score: 7  Pain Location: leg leg Pain Descriptors / Indicators: Aching Pain Intervention(s): Limited activity within patient's tolerance;Monitored during session    [facility] expects to be discharged to:: Assisted living               Home Equipment: Wheelchair - [employer] - 4 wheels Additional Comments: Patient states she has her own wheelchair and walker, but that equipment needed for bathing and toiletting is available to assisted living facility.    Prior Function Level of Independence: Needs assistance   Gait / Transfers Assistance Needed: Pt reports that she only ambulates with phyical therapy staff, 2 person assist for transfers at ALF, mostly wheelchair bound  ADL's / Homemaking Assistance Needed: Pt receives assitance from staff. Reports Moderate assitance needed for bathing  and max assistance for dressing.        Hand Dominance   Dominant Hand: Right    Extremity/Trunk Assessment    Upper Extremity Assessment Upper Extremity Assessment: Defer to OT evaluation    Lower Extremity Assessment Lower Extremity Assessment: Generalized weakness    Cervical / Trunk Assessment Cervical / Trunk Assessment: Normal  Communication   Communication: Expressive difficulties  Cognition Arousal/Alertness: Awake/alert Behavior During Therapy: WFL for tasks assessed/performed Overall Cognitive Status: Within Functional Limits for tasks assessed                                        General Comments      Exercises     Assessment/Plan    PT Assessment Patient needs continued PT services  PT Problem List Decreased strength;Decreased activity tolerance;Decreased balance;Decreased mobility       PT Treatment Interventions Gait training;Functional mobility training;Therapeutic activities;Therapeutic exercise;Patient/family education;Neuromuscular re-education    PT Goals (Current goals can be found in the Care Plan section)  Acute Rehab PT Goals Patient Stated Goal: return home with assistance PT Goal Formulation: With patient Time For Goal Achievement: 01/26/20 Potential to Achieve Goals: Good    Frequency Min 3X/week   Barriers to discharge        Co-evaluation PT/OT/SLP Co-Evaluation/Treatment: Yes Reason for Co-Treatment: For patient/therapist safety;To address functional/ADL transfers PT goals addressed during session: Mobility/safety with mobility;Balance;Proper use of DME OT goals addressed during session: ADL's and self-care;Strengthening/ROM       AM-PAC PT "6 Clicks" Mobility  Outcome Measure Help needed turning from your back to your side while in a flat bed without using bedrails?: A Little Help needed moving from lying on your back to sitting on the side of a flat bed without using bedrails?: A Lot Help needed moving to and from a bed to a chair (including a wheelchair)?: A Lot Help needed standing up from a chair using your arms  (e.g., wheelchair or bedside chair)?: A Lot Help needed to walk in hospital room?: A Lot Help needed climbing 3-5 steps with a railing? : Total 6 Click Score: 12    End of Session   Activity Tolerance: Patient tolerated treatment well;Patient limited by fatigue Patient left: in chair;with call bell/phone within reach;with chair alarm set Nurse Communication: Mobility status PT Visit Diagnosis: Unsteadiness on feet (R26.81);Other abnormalities of gait and mobility (R26.89);Muscle weakness (generalized) (M62.81)    Time: 6122-4497 PT Time Calculation (min) (ACUTE ONLY): 33 min   Charges:   PT Evaluation $PT Eval Moderate Complexity: 1 Mod PT Treatments $Therapeutic Activity: 23-37 mins        11:07 AM, 01/23/20 Lonell Grandchild, MPT Physical Therapist with [facility] [phone] office [phone] mobile phone

## 2020-01-23 NOTE — Plan of Care (Signed)
  Problem: Acute Rehab PT Goals(only PT should resolve) Goal: Pt Will Go Supine/Side To Sit Outcome: Progressing Flowsheets (Taken 01/23/2020 1109) Pt will go Supine/Side to Sit:  with minimal assist  with moderate assist Goal: Patient Will Transfer Sit To/From Stand Outcome: Progressing Flowsheets (Taken 01/23/2020 1109) Patient will transfer sit to/from stand:  with minimal assist  with moderate assist Goal: Pt Will Transfer Bed To Chair/Chair To Bed Outcome: Progressing Flowsheets (Taken 01/23/2020 1109) Pt will Transfer Bed to Chair/Chair to Bed:  with min assist  with mod assist Goal: Pt Will Ambulate Outcome: Progressing Flowsheets (Taken 01/23/2020 1109) Pt will Ambulate:  15 feet  with moderate assist  with rolling walker   11:09 AM, 01/23/20 Lonell Grandchild, MPT Physical Therapist with [facility] [phone] office [phone] mobile phone

## 2020-01-23 NOTE — Progress Notes (Signed)
Unable to perform EEG; EEG study is unreadable due to high amount of artifact; tech unplugged all electrical equipment in room, replaced electrodes,etc. Also rebooted computer. Let nurse know unable to perform quality EEG at this time.

## 2020-01-23 NOTE — Progress Notes (Signed)
Nsg Discharge Note  Admit Date:  01/22/2020 Discharge date: 01/23/2020   Courtney Tucker to be D/C'd to [facility] per MD order.  AVS completed.  Copy for chart, and copy for patient signed, and dated. Patient/caregiver able to verbalize understanding.  Discharge Medication: Allergies as of 01/23/2020      Reactions   Oxycodone Other (See Comments)   Can tolerate low dose mixed with tynlelol HALLUCINATIONS    Aspirin Nausea And Vomiting, Other (See Comments)   Severe stomach pain    Codeine Nausea Only   Makes her feel very sick       Medication List    STOP taking these medications   acetaminophen-codeine 300-30 MG tablet Commonly known as: TYLENOL #3     TAKE these medications   acetaminophen 500 MG tablet Commonly known as: TYLENOL Take 500 mg by mouth every 6 (six) hours as needed for mild pain or moderate pain.   albuterol 108 (90 Base) MCG/ACT inhaler Commonly known as: VENTOLIN HFA Inhale 2 puffs into the lungs every 6 (six) hours as needed for wheezing or shortness of breath.   ALPRAZolam 0.5 MG tablet Commonly known as: XANAX Take 1 tablet (0.5 mg total) by mouth 2 (two) times daily as needed.   amLODipine 10 MG tablet Commonly known as: NORVASC Take 10 mg by mouth daily.   atorvastatin 40 MG tablet Commonly known as: LIPITOR Take 1 tablet (40 mg total) by mouth daily. Start taking on: January 24, 2020   Breztri Aerosphere 160-9-4.8 MCG/ACT Aero Generic drug: Budeson-Glycopyrrol-Formoterol   Carbidopa-Levodopa ER 25-100 MG tablet controlled release Commonly known as: SINEMET CR Take 1 tablet by mouth every morning.   chlorhexidine 0.12 % solution Commonly known as: PERIDEX 15 mLs by Mouth Rinse route 2 (two) times daily.   clopidogrel 75 MG tablet Commonly known as: PLAVIX Take 75 mg by mouth daily.   docusate sodium 100 MG capsule Commonly known as: COLACE Take 100 mg by mouth every other day.   DULoxetine 60 MG  capsule Commonly known as: CYMBALTA Take 60 mg by mouth 2 (two) times daily.   HYDROcodone-acetaminophen 5-325 MG tablet Commonly known as: NORCO/VICODIN Take 1 tablet by mouth every 6 (six) hours as needed for moderate pain. What changed:   when to take this  reasons to take this   losartan 100 MG tablet Commonly known as: COZAAR Take 100 mg by mouth daily.   Narcan 4 MG/0.1ML Liqd nasal spray kit Generic drug: naloxone Place 1 spray into the nose once.   omeprazole 20 MG capsule Commonly known as: PRILOSEC Take 20 mg by mouth in the morning and at bedtime.   oxybutynin 5 MG tablet Commonly known as: DITROPAN Take 5 mg by mouth 2 (two) times daily.   polyethylene glycol 17 g packet Commonly known as: MIRALAX / GLYCOLAX Take 17 g by mouth daily as needed.   pramipexole 1 MG tablet Commonly known as: MIRAPEX Take 1.5 mg by mouth at bedtime.   sodium phosphate Pediatric 3.5-9.5 GM/59ML enema Place 1 enema rectally once as needed for severe constipation.   traZODone 150 MG tablet Commonly known as: DESYREL Take 150 mg by mouth at bedtime.   Vitamin D 50 MCG (2000 UT) tablet Take 2,000 Units by mouth daily.       Discharge Assessment: Vitals:   01/23/20 0506 01/23/20 0809  BP: (!) 106/41   Pulse: 65   Resp: 16   Temp: 98 F (36.7 C)  SpO2: 96% 96%   Skin clean, dry and intact without evidence of skin break down, no evidence of skin tears noted. IV catheter discontinued intact. Site without signs and symptoms of complications - no redness or edema noted at insertion site, patient denies c/o pain - only slight tenderness at site.  Dressing with slight pressure applied.  D/c Instructions-Education: Discharge instructions given to patient/family with verbalized understanding. D/c education completed with patient/family including follow up instructions, medication list, d/c activities limitations if indicated, with other d/c instructions as indicated by MD -  patient able to verbalize understanding, all questions fully answered. Patient instructed to return to ED, call 911, or call MD for any changes in condition.  Patient escorted via EMS to [place], RN 01/23/2020 6:17 PM

## 2020-01-23 NOTE — Discharge Summary (Signed)
Courtney Tucker, is a 74 y.o. female  DOB 06-05-1945  MRN [id number].  Admission date:  01/22/2020  Admitting Physician  Roxan Hockey, MD  Discharge Date:  01/23/2020   Primary MD  Scotty Court, DO  Recommendations for primary care physician for things to follow:   1)Please follow-up with Neurologist Dr. Phillips Odor-- Phone: [phone], Address: [address], [place], [place] [address] in 4 to 6 weeks for recheck and reevaluation.  Please call to make appointment with him  2)Neurologist Dr. Phillips Odor may be able to order outpatient EEG test for you  Admission Diagnosis  Parkinson disease ([place]) [G20] Weakness [Y40.3] Acute metabolic encephalopathy [K74.25]   Discharge Diagnosis  Parkinson disease ([place]) [G20] Weakness [Z56.3] Acute metabolic encephalopathy [O75.64]    Principal Problem:   Acute metabolic encephalopathy Active Problems:   TIA (transient ischemic attack)   Essential hypertension   Paralysis agitans ([place])   Abnormality of gait   Parkinson's disease ([place])   S/P deep brain stimulator placement   H/o COVID-19 virus infection--December 2020      Past Medical History:  Diagnosis Date  . Anxiety   . Arthritis   . Complication of anesthesia   . Depression   . Depression   . Dysphasia   . GERD (gastroesophageal reflux disease)   . Hyperlipidemia   . Hypertension   . Parkinson's disease ([place])    diagnosed at age 89  . PONV (postoperative nausea and vomiting)    states it has been a long time since getting sick  . Reflux   . Sleep apnea    Has CPAP but doesnt use  . Stroke ([place])   . TIA (transient ischemic attack)    not really TIA but abnormal MRI per pt left sided weakness  . UTI (lower urinary tract infection)   . Vitamin D deficiency     Past Surgical History:  Procedure Laterality Date  . BACK SURGERY    . Battery change     DBS,  12/2009  . BIOPSY N/A 11/29/2013   Procedure: GASTRIC BIOPSY;  Surgeon: Danie Binder, MD;  Location: [facility];  Service: Endoscopy;  Laterality: N/A;  . BURR HOLE W/ STEREOTACTIC INSERTION OF DBS LEADS / INTRAOP MICROELECTRODE RECORDING     2007  . CARPAL TUNNEL RELEASE Right   . COLONOSCOPY WITH PROPOFOL N/A 11/29/2013   Procedure: COLONOSCOPY WITH PROPOFOL;  Surgeon: Danie Binder, MD;  Location: [facility];  Service: Endoscopy;  Laterality: N/A;  entered cecum @ [id number]; total cecal withdrawal time 21 minutes   . ELBOW SURGERY Left    after fx  . ESOPHAGEAL DILATION N/A 11/29/2013   Procedure: ESOPHAGEAL DILATION;  Surgeon: Danie Binder, MD;  Location: [facility];  Service: Endoscopy;  Laterality: N/A;  [place] 14/15/16  . ESOPHAGOGASTRODUODENOSCOPY (EGD) WITH PROPOFOL N/A 11/29/2013   Procedure: ESOPHAGOGASTRODUODENOSCOPY (EGD) WITH PROPOFOL;  Surgeon: Danie Binder, MD;  Location: [facility];  Service: Endoscopy;  Laterality: N/A;  . FOOT SURGERY  Left    "something with 2nd 2."  . NECK SURGERY     Fusion  . POLYPECTOMY N/A 11/29/2013   Procedure: POLYPECTOMY;  Surgeon: Danie Binder, MD;  Location: [facility];  Service: Endoscopy;  Laterality: N/A;  Distal Transverse Colon x2   . PR ANALYZE NEUROSTIM BRAIN, FIRST 1H  10/15/2012      . PULSE GENERATOR IMPLANT Right 03/30/2018   Procedure: Right Implantable pulse generator change;  Surgeon: Erline Levine, MD;  Location: [facility];  Service: Neurosurgery;  Laterality: Right;  . SUBTHALAMIC STIMULATOR BATTERY REPLACEMENT N/A 08/05/2013   Procedure: SUBTHALAMIC STIMULATOR BATTERY REPLACEMENT;  Surgeon: Erline Levine, MD;  Location: [facility];  Service: Neurosurgery;  Laterality: N/A;  . SUBTHALAMIC STIMULATOR BATTERY REPLACEMENT N/A 11/26/2015   Procedure: Implantable pulse generator battery change for deep brain stimulator;  Surgeon: Erline Levine, MD;  Location: [facility];  Service: Neurosurgery;  Laterality: N/A;  . VAGINAL HYSTERECTOMY     "partial"  . WRIST  SURGERY Right    after fx     HPI  from the history and physical done on the day of admission:    Courtney Tucker  is a 74 y.o. female with a history of advanced parkinson's disease status post deep brain neurostimulator placement, history of pseudobulbar affect and paralysis agitans,  anxiety, depression, hypertension, hyperlipidemia, history of prior TIA with chronic dysarthria and possibly chronic right-sided facial droop presenting from [facility] with concerns for confusion and disorientation --- EMS reports pt resident of [facility] and has history of stroke. Staff says pt was acting unusual at breakfast this morning. Around 9am, staff says pt's speech is more slurred than usual and pt leaning more to the right today. Appears to have r sided facial droop and c/o left leg pain.   -Telemetry neurology consult appreciated, -Patient speech is difficult to understand and she speaks very softly, very quietly --- - In ED UA is not suggestive of UTI -Potassium is 3.4 glucose is 129 with a creatinine of 0.75 -WBC 5.7 hemoglobin 15.1 platelets 269 UDS pending -Covid 19 negative this time (patient had Covid back in December 2020) -CTA head and neck without LVO CT head without contrast and CT cerebral perfusion without acute stroke - -Telemetry neurologist recommends inpatient neuro work-up     Hospital Course:    1)Acute Metabolic Encephalopathy- TIA Vs Sz--- -- on telemetry monitored unit no arrhythmias --Patient is allergic to Aspirin, continue Plavix, added Lipitor --- Unable to do MRI of the brain due to neurostimulator in situ for Parkinson's  echocardiogram without intracardiac thrombus, EF noted  --- CTA head and neck without large Vessel occlusion or hemodynamically significant stenosis  ---  PT/OT eval appreciated recommends home health PT, -Mentation appears back to baseline, no new neuro deficits at this time -Oral intake is fine --Unable to complete EEG --please  see EEG tech note -Telemetry neurology input appreciated  2)Advanced Parkinson's disease--- s/p  Brain  neurostimulator (DBS) placement --Continue current Mirapex  3)Depression Continue duloxetine daily and trazodone nightly  4)Chronic neck pain-  S/p --C5-C7 ACDF with solid arthrodesis at C5-6 and 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   Specialty: Home Health Services Why: PT Contact information: [address]  [place] [address] [phone]            Contact information for after-discharge care    Destination    [facility] .   Service: Assisted Living Contact information: [address] [place] [phone]                  Consults obtained -telemetry neurology  Diet and Activity recommendation:  As advised  Discharge Instructions    Discharge Instructions    Call MD for:  difficulty breathing, headache or visual disturbances   Complete by: As directed    Call MD for:  persistant dizziness or light-headedness   Complete by: As directed    Call MD for:  persistant nausea and vomiting   Complete by: As directed    Call MD for:  severe uncontrolled pain   Complete by: As directed    Call MD for:  temperature >100.4   Complete by: As directed    Diet - low sodium heart healthy   Complete by: As directed    Discharge instructions   Complete by: As directed    1)Please follow-up with Neurologist Dr. Phillips Odor-- Phone: [phone], Address: [address], [place], [place] [address] in 4 weeks for recheck and reevaluation.  Please call to make appointment with him  2) neurologist Dr. Phillips Odor  may be able to order outpatient EEG test for you   Increase activity slowly   Complete by: As directed        Discharge Medications     Allergies as of 01/23/2020      Reactions   Oxycodone Other (See Comments)   Can tolerate low dose mixed with tynlelol HALLUCINATIONS    Aspirin Nausea And Vomiting, Other (See Comments)   Severe stomach pain    Codeine Nausea Only   Makes her feel very sick       Medication List    STOP taking these medications   acetaminophen-codeine 300-30 MG tablet Commonly known as: TYLENOL #3     TAKE these medications   acetaminophen 500 MG tablet Commonly known as: TYLENOL Take 500 mg by mouth every 6 (six) hours as needed for mild pain or moderate pain.   albuterol 108 (90 Base) MCG/ACT inhaler Commonly known as: VENTOLIN HFA Inhale 2 puffs into the lungs every 6 (six) hours as needed for wheezing or shortness of breath.   ALPRAZolam 0.5 MG tablet Commonly known as: XANAX Take 1 tablet (0.5 mg total) by mouth 2 (two) times daily as needed.   amLODipine 10 MG tablet Commonly known as: NORVASC Take 10 mg by mouth daily.   atorvastatin 40 MG tablet Commonly known as: LIPITOR Take 1 tablet (40 mg total) by mouth daily. Start taking on: January 24, 2020   Breztri Aerosphere 160-9-4.8 MCG/ACT Aero Generic drug: Budeson-Glycopyrrol-Formoterol   Carbidopa-Levodopa ER 25-100 MG tablet controlled release Commonly known as: SINEMET CR Take 1 tablet by mouth every morning.   chlorhexidine 0.12 % solution Commonly known as: PERIDEX 15 mLs by Mouth Rinse route 2 (two) times daily.   clopidogrel 75 MG tablet Commonly known as: PLAVIX Take 75 mg by mouth daily.   docusate sodium 100 MG capsule Commonly known as: COLACE Take 100 mg by mouth every other day.   DULoxetine 60 MG capsule Commonly known as: CYMBALTA Take 60 mg by mouth 2 (two) times daily.   HYDROcodone-acetaminophen 5-325 MG tablet Commonly known as: NORCO/VICODIN  Take 1  tablet by mouth every 6 (six) hours as needed for moderate pain. What changed:   when to take this  reasons to take this   losartan 100 MG tablet Commonly known as: COZAAR Take 100 mg by mouth daily.   Narcan 4 MG/0.1ML Liqd nasal spray kit Generic drug: naloxone Place 1 spray into the nose once.   omeprazole 20 MG capsule Commonly known as: PRILOSEC Take 20 mg by mouth in the morning and at bedtime.   oxybutynin 5 MG tablet Commonly known as: DITROPAN Take 5 mg by mouth 2 (two) times daily.   polyethylene glycol 17 g packet Commonly known as: MIRALAX / GLYCOLAX Take 17 g by mouth daily as needed.   pramipexole 1 MG tablet Commonly known as: MIRAPEX Take 1.5 mg by mouth at bedtime.   sodium phosphate Pediatric 3.5-9.5 GM/59ML enema Place 1 enema rectally once as needed for severe constipation.   traZODone 150 MG tablet Commonly known as: DESYREL Take 150 mg by mouth at bedtime.   Vitamin D 50 MCG (2000 UT) tablet Take 2,000 Units by mouth daily.       Major procedures and Radiology Reports - PLEASE review detailed and final reports for all details, in brief -   DG Lumbar Spine Complete  Result Date: 01/03/2020 CLINICAL DATA:  Fall, back pain EXAM: LUMBAR SPINE - COMPLETE 4+ VIEW COMPARISON:  None. FINDINGS: There is slightly accentuated lumbar lordosis. L4-5 anterior and posterior lumbar fusion with instrumentation is again noted. No acute fracture or listhesis of the lumbar spine. There is intervertebral disc space narrowing and endplate remodeling of Y56-L8, most severe at L1-2, in keeping with changes of moderate to severe degenerative disc disease. There is superimposed 7 mm retrolisthesis of L1 upon L2, likely degenerative in nature. The osseous structures are diffusely mildly osteopenic. The paraspinal soft tissues are unremarkable. IMPRESSION: No acute fracture or traumatic listhesis. Electronically Signed   By: Fidela Salisbury MD   On: 01/03/2020 19:04   CT  Head Wo Contrast  Result Date: 01/22/2020 CLINICAL DATA:  Abnormal speech EXAM: CT HEAD WITHOUT CONTRAST TECHNIQUE: Contiguous axial images were obtained from the base of the skull through the vertex without intravenous contrast. COMPARISON:  03/29/2019 FINDINGS: Brain: There is significant streak artifact associated with deep brain stimulator leads within the right scalp soft tissues extending to the vertex and terminating in the subthalamic regions bilaterally. Within this limitation, there is no acute intracranial hemorrhage or new loss of gray-white differentiation. Ventricles are stable in size. Patchy hypoattenuation in the supratentorial white matter is nonspecific but probably reflects stable chronic microvascular ischemic changes. Vascular: There is mild atherosclerotic calcification at the skull base. Skull: Bilateral burr holes at the vertex. Sinuses/Orbits: No acute finding. Other: None. IMPRESSION: No acute intracranial abnormality.  Chronic findings detailed above. Electronically Signed   By: Macy Mis M.D.   On: 01/22/2020 11:16   CT Lumbar Spine Wo Contrast  Result Date: 01/03/2020 CLINICAL DATA:  Lower back pain, question fracture EXAM: CT LUMBAR SPINE WITHOUT CONTRAST TECHNIQUE: Multidetector CT imaging of the lumbar spine was performed without intravenous contrast administration. Multiplanar CT image reconstructions were also generated. COMPARISON:  Radiograph same day FINDINGS: Segmentation: There are 5 non-rib bearing lumbar type vertebral bodies with the last intervertebral disc space labeled as L5-S1. Alignment: There is a mild leftward curvature of the lumbar spine. There is minimal retrolisthesis of T12 on L1-L1 on L2 and L2 on L3. Vertebrae: The patient is status post  fixation at L4-L5 with transpedicular rod and screw fixation. No periprosthetic lucency is seen. There is a left-sided interbody fusion device noted. No definite subsidence is seen. Paraspinal and other soft  tissues: Scattered punctate calcifications seen at the upper pole of the right kidney. The sacroiliac joints are intact. Disc levels: T12-L1:  No significant canal or neural foraminal narrowing. L1-L2: There is facet arthrosis and a broad-based disc bulge which causes moderate to severe right and moderate left neural foraminal narrowing. L2-L3: There is a broad-based disc bulge and facet arthrosis which causes severe bilateral neural foraminal narrowing and mild central canal stenosis. L3-L4: Facet arthrosis and a broad-based disc bulge is present which causes moderate to severe bilateral neural foraminal narrowing and moderate central canal stenosis. L4-L5:  No significant canal or neural foraminal narrowing. L5-S1:   No significant canal or neural foraminal narrowing. IMPRESSION: No definite acute fracture seen. Status post fixation at L4-L5 with a left-sided interbody fusion. No definite hardware complication. Minimal retrolisthesis from T12 through L3. Lumbar spine spondylosis most notable at L3-L4 with moderate to severe bilateral neural foraminal narrowing and moderate central canal stenosis. Electronically Signed   By: Prudencio Pair M.D.   On: 01/03/2020 21:24   CT Hip Left Wo Contrast  Result Date: 01/03/2020 CLINICAL DATA:  Left hip pain, fall EXAM: CT OF THE LEFT HIP WITHOUT CONTRAST TECHNIQUE: Multidetector CT imaging of the left hip was performed according to the standard protocol. Multiplanar CT image reconstructions were also generated. COMPARISON:  Plain films earlier today FINDINGS: Layering high-density material within the left posterior bladder lumen, presumably bladder stones. Visualized pelvic large and small bowel unremarkable. Prior hysterectomy. No free fluid. No acute bony abnormality. No fracture, subluxation or dislocation within the left hip. IMPRESSION: No acute bony abnormality. Small layering stones within the bladder. Electronically Signed   By: Rolm Baptise M.D.   On: 01/03/2020  21:11   CT CEREBRAL PERFUSION W CONTRAST  Result Date: 01/22/2020 CLINICAL DATA:  Left-sided weakness EXAM: CT ANGIOGRAPHY HEAD AND NECK CT PERFUSION BRAIN TECHNIQUE: Multidetector CT imaging of the head and neck was performed using the standard protocol during bolus administration of intravenous contrast. Multiplanar CT image reconstructions and MIPs were obtained to evaluate the vascular anatomy. Carotid stenosis measurements (when applicable) are obtained utilizing NASCET criteria, using the distal internal carotid diameter as the denominator. Multiphase CT imaging of the brain was performed following IV bolus contrast injection. Subsequent parametric perfusion maps were calculated using RAPID software. CONTRAST:  163m OMNIPAQUE IOHEXOL 350 MG/ML SOLN COMPARISON:  None. FINDINGS: Streak artifact from deep brain stimulator generator and leads, dental amalgam, and cervical spine fusion hardware. CTA NECK FINDINGS Aortic arch: Great vessel origins are patent. Right carotid system: Patent. Mild calcified plaque at the ICA origin without measurable stenosis. Left carotid system: Patent. No measurable stenosis at the ICA origin. Vertebral arteries: Patent.  Left vertebral artery is dominant. Skeleton: Multilevel cervical spine degenerative changes. There are postoperative changes of prior anterior fusion at C5-C7. Other neck: No mass or adenopathy. Upper chest: Retained debris within the superior soft esophagus. No apical lung mass. Review of the MIP images confirms the above findings CTA HEAD FINDINGS Anterior circulation: Intracranial internal carotid arteries are patent with minimal calcified plaque. Anterior and middle cerebral arteries are patent. Posterior circulation: Intracranial vertebral arteries are patent. Right vertebral artery is diminutive after PICA origin. There is calcified plaque likely causing moderate stenosis with limited evaluation due to small size of vessel and streak artifact. Calcified  plaque causes mild stenosis of the left vertebral artery. Venous sinuses: As permitted by contrast timing, patent. Review of the MIP images confirms the above findings CT Brain Perfusion Findings: CBF (<30%) Volume: 1m Perfusion (Tmax>6.0s) volume: 06mMismatch Volume: 36m33mnfarction Location: None. IMPRESSION: No large vessel occlusion or hemodynamically significant stenosis. Perfusion imaging demonstrates no evidence of core infarction or territory at risk. These results were called by telephone at the time of interpretation on 01/22/2020 at 12:13 pm to provider NATDavonna Bellingwho verbally acknowledged these results. Electronically Signed   By: PraMacy MisD.   On: 01/22/2020 12:17   DG Knee Complete 4 Views Left  Result Date: 01/03/2020 CLINICAL DATA:  Fall, left knee pain EXAM: LEFT KNEE - COMPLETE 4+ VIEW COMPARISON:  None. FINDINGS: No evidence of fracture, dislocation, or joint effusion. No evidence of arthropathy or other focal bone abnormality. Soft tissues are unremarkable. IMPRESSION: Negative. Electronically Signed   By: AshFidela Salisbury   On: 01/03/2020 19:05   ECHOCARDIOGRAM COMPLETE  Result Date: 01/23/2020    ECHOCARDIOGRAM REPORT   Patient Name:   Courtney Tucker Date of Exam: 01/23/2020 Medical Rec #:  [id number]        Height:       62.0 in Accession #:    [id number]       Weight:       178.4 lb Date of Birth:  10/14-Oct-1945       BSA:          1.821 m Patient Age:    [age]ars            BP:           106/41 mmHg Patient Gender: F                   HR:           65 bpm. [facility] 2D Echo Indications:    TIA 435.9 / G45.9  History:        Patient has prior history of Echocardiogram examinations, most                 recent 03/30/2019. TIA, Signs/Symptoms:Syncope and Chest Pain;                 Risk Factors:Hypertension, Dyslipidemia and Former Smoker. S/P                 deep brain stimulator placement, Parkinson disease , H/o                  COVID-19 virus infection--December 2020.  Sonographer:    JohLeavy CellaCS (AE) Referring Phys: [id number] Namine Beahm IMPRESSIONS  1. Left ventricular ejection fraction, by estimation, is 60 to 65%. The left ventricle has normal function. The left ventricle has no regional wall motion abnormalities. There is moderate left ventricular hypertrophy. Left ventricular diastolic parameters are consistent with Grade I diastolic dysfunction (impaired relaxation).  2. Right ventricular systolic function is normal. The right ventricular size is normal. There is mildly elevated pulmonary artery systolic pressure.  3. The mitral valve is normal in structure. Trivial mitral valve regurgitation. No evidence of mitral stenosis.  4. The aortic valve is tricuspid. Aortic valve regurgitation is not visualized. No aortic stenosis is present.  5. The inferior vena cava is normal in size with greater than 50% respiratory variability, suggesting right atrial pressure  of 3 mmHg. FINDINGS  Left Ventricle: Left ventricular ejection fraction, by estimation, is 60 to 65%. The left ventricle has normal function. The left ventricle has no regional wall motion abnormalities. The left ventricular internal cavity size was normal in size. There is  moderate left ventricular hypertrophy. Left ventricular diastolic parameters are consistent with Grade I diastolic dysfunction (impaired relaxation). Right Ventricle: The right ventricular size is normal. No increase in right ventricular wall thickness. Right ventricular systolic function is normal. There is mildly elevated pulmonary artery systolic pressure. The tricuspid regurgitant velocity is 2.84  m/s, and with an assumed right atrial pressure of 10 mmHg, the estimated right ventricular systolic pressure is 75.4 mmHg. Left Atrium: Left atrial size was normal in size. Right Atrium: Right atrial size was normal in size. Pericardium: There is no evidence of pericardial effusion. Mitral Valve: The  mitral valve is normal in structure. Trivial mitral valve regurgitation. No evidence of mitral valve stenosis. Tricuspid Valve: The tricuspid valve is normal in structure. Tricuspid valve regurgitation is trivial. No evidence of tricuspid stenosis. Aortic Valve: The aortic valve is tricuspid. . There is mild thickening and mild calcification of the aortic valve. Aortic valve regurgitation is not visualized. No aortic stenosis is present. Mild aortic valve annular calcification. There is mild thickening of the aortic valve. There is mild calcification of the aortic valve. Aortic valve mean gradient measures 4.2 mmHg. Aortic valve peak gradient measures 7.2 mmHg. Aortic valve area, by VTI measures 2.41 cm. Pulmonic Valve: The pulmonic valve was not well visualized. Pulmonic valve regurgitation is not visualized. No evidence of pulmonic stenosis. Aorta: The aortic root is normal in size and structure. Venous: The inferior vena cava is normal in size with greater than 50% respiratory variability, suggesting right atrial pressure of 3 mmHg. IAS/Shunts: No atrial level shunt detected by color flow Doppler.  LEFT VENTRICLE PLAX 2D LVIDd:         3.03 cm  Diastology LVIDs:         1.89 cm  LV e' lateral:   6.31 cm/s LV PW:         1.44 cm  LV E/e' lateral: 12.5 LV IVS:        1.39 cm  LV e' medial:    6.85 cm/s LVOT diam:     1.80 cm  LV E/e' medial:  11.5 LV SV:         67 LV SV Index:   37 LVOT Area:     2.54 cm  RIGHT VENTRICLE RV S prime:     21.00 cm/s TAPSE (M-mode): 2.4 cm LEFT ATRIUM             Index       RIGHT ATRIUM          Index LA diam:        3.50 cm 1.92 cm/m  RA Area:     9.15 cm LA Vol (A2C):   31.0 ml 17.03 ml/m RA Volume:   16.00 ml 8.79 ml/m LA Vol (A4C):   28.6 ml 15.71 ml/m LA Biplane Vol: 31.0 ml 17.03 ml/m  AORTIC VALVE AV Area (Vmax):    2.43 cm AV Area (Vmean):   2.16 cm AV Area (VTI):     2.41 cm AV Vmax:           134.38 cm/s AV Vmean:          98.520 cm/s AV VTI:  0.278 m  AV Peak Grad:      7.2 mmHg AV Mean Grad:      4.2 mmHg LVOT Vmax:         128.54 cm/s LVOT Vmean:        83.721 cm/s LVOT VTI:          0.262 m LVOT/AV VTI ratio: 0.95  AORTA Ao Root diam: 2.70 cm MITRAL VALVE               TRICUSPID VALVE MV Area (PHT): 3.17 cm    TR Peak grad:   32.3 mmHg MV Decel Time: 239 msec    TR Vmax:        284.00 cm/s MV E velocity: 78.80 cm/s MV A velocity: 96.80 cm/s  SHUNTS MV E/A ratio:  0.81        Systemic VTI:  0.26 m                            Systemic Diam: 1.80 cm Carlyle Dolly MD Electronically signed by Carlyle Dolly MD Signature Date/Time: 01/23/2020/3:49:26 PM    Final    DG Hip Unilat W or Wo Pelvis 2-3 Views Left  Result Date: 01/03/2020 CLINICAL DATA:  Fall, left hip pain EXAM: DG HIP (WITH OR WITHOUT PELVIS) 2-3V LEFT COMPARISON:  None. FINDINGS: There is no evidence of hip fracture or dislocation. There is no evidence of arthropathy or other focal bone abnormality. IMPRESSION: Negative. Electronically Signed   By: Fidela Salisbury MD   On: 01/03/2020 19:05   CT ANGIO HEAD CODE STROKE  Result Date: 01/22/2020 CLINICAL DATA:  Left-sided weakness EXAM: CT ANGIOGRAPHY HEAD AND NECK CT PERFUSION BRAIN TECHNIQUE: Multidetector CT imaging of the head and neck was performed using the standard protocol during bolus administration of intravenous contrast. Multiplanar CT image reconstructions and MIPs were obtained to evaluate the vascular anatomy. Carotid stenosis measurements (when applicable) are obtained utilizing NASCET criteria, using the distal internal carotid diameter as the denominator. Multiphase CT imaging of the brain was performed following IV bolus contrast injection. Subsequent parametric perfusion maps were calculated using RAPID software. CONTRAST:  1664m OMNIPAQUE IOHEXOL 350 MG/ML SOLN COMPARISON:  None. FINDINGS: Streak artifact from deep brain stimulator generator and leads, dental amalgam, and cervical spine fusion hardware. CTA NECK FINDINGS  Aortic arch: Great vessel origins are patent. Right carotid system: Patent. Mild calcified plaque at the ICA origin without measurable stenosis. Left carotid system: Patent. No measurable stenosis at the ICA origin. Vertebral arteries: Patent.  Left vertebral artery is dominant. Skeleton: Multilevel cervical spine degenerative changes. There are postoperative changes of prior anterior fusion at C5-C7. Other neck: No mass or adenopathy. Upper chest: Retained debris within the superior soft esophagus. No apical lung mass. Review of the MIP images confirms the above findings CTA HEAD FINDINGS Anterior circulation: Intracranial internal carotid arteries are patent with minimal calcified plaque. Anterior and middle cerebral arteries are patent. Posterior circulation: Intracranial vertebral arteries are patent. Right vertebral artery is diminutive after PICA origin. There is calcified plaque likely causing moderate stenosis with limited evaluation due to small size of vessel and streak artifact. Calcified plaque causes mild stenosis of the left vertebral artery. Venous sinuses: As permitted by contrast timing, patent. Review of the MIP images confirms the above findings CT Brain Perfusion Findings: CBF (<30%) Volume: 066mPerfusion (Tmax>6.0s) volume: 64m24mismatch Volume: 64mL63mfarction Location: None. IMPRESSION: No large vessel occlusion or hemodynamically significant stenosis.  Perfusion imaging demonstrates no evidence of core infarction or territory at risk. These results were called by telephone at the time of interpretation on 01/22/2020 at 12:13 pm to provider Davonna Belling , who verbally acknowledged these results. Electronically Signed   By: Macy Mis M.D.   On: 01/22/2020 12:17   CT ANGIO NECK CODE STROKE  Result Date: 01/22/2020 CLINICAL DATA:  Left-sided weakness EXAM: CT ANGIOGRAPHY HEAD AND NECK CT PERFUSION BRAIN TECHNIQUE: Multidetector CT imaging of the head and neck was performed using the  standard protocol during bolus administration of intravenous contrast. Multiplanar CT image reconstructions and MIPs were obtained to evaluate the vascular anatomy. Carotid stenosis measurements (when applicable) are obtained utilizing NASCET criteria, using the distal internal carotid diameter as the denominator. Multiphase CT imaging of the brain was performed following IV bolus contrast injection. Subsequent parametric perfusion maps were calculated using RAPID software. CONTRAST:  174m OMNIPAQUE IOHEXOL 350 MG/ML SOLN COMPARISON:  None. FINDINGS: Streak artifact from deep brain stimulator generator and leads, dental amalgam, and cervical spine fusion hardware. CTA NECK FINDINGS Aortic arch: Great vessel origins are patent. Right carotid system: Patent. Mild calcified plaque at the ICA origin without measurable stenosis. Left carotid system: Patent. No measurable stenosis at the ICA origin. Vertebral arteries: Patent.  Left vertebral artery is dominant. Skeleton: Multilevel cervical spine degenerative changes. There are postoperative changes of prior anterior fusion at C5-C7. Other neck: No mass or adenopathy. Upper chest: Retained debris within the superior soft esophagus. No apical lung mass. Review of the MIP images confirms the above findings CTA HEAD FINDINGS Anterior circulation: Intracranial internal carotid arteries are patent with minimal calcified plaque. Anterior and middle cerebral arteries are patent. Posterior circulation: Intracranial vertebral arteries are patent. Right vertebral artery is diminutive after PICA origin. There is calcified plaque likely causing moderate stenosis with limited evaluation due to small size of vessel and streak artifact. Calcified plaque causes mild stenosis of the left vertebral artery. Venous sinuses: As permitted by contrast timing, patent. Review of the MIP images confirms the above findings CT Brain Perfusion Findings: CBF (<30%) Volume: 038mPerfusion (Tmax>6.0s)  volume: 19m64mismatch Volume: 19mL26mfarction Location: None. IMPRESSION: No large vessel occlusion or hemodynamically significant stenosis. Perfusion imaging demonstrates no evidence of core infarction or territory at risk. These results were called by telephone at the time of interpretation on 01/22/2020 at 12:13 pm to provider NATHDavonna Bellingho verbally acknowledged these results. Electronically Signed   By: PranMacy Mis.   On: 01/22/2020 12:17    Micro Results   Recent Results (from the past 240 hour(s))  SARS Coronavirus 2 by RT PCR (hospital order, performed in [facility] lab) Nasopharyngeal Nasopharyngeal Swab     Status: None   Collection Time: 01/22/20 12:32 PM   Specimen: Nasopharyngeal Swab  Result Value Ref Range Status   SARS Coronavirus 2 NEGATIVE NEGATIVE Final    Comment: (NOTE) SARS-CoV-2 target nucleic acids are NOT DETECTED.  The SARS-CoV-2 RNA is generally detectable in upper and lower respiratory specimens during the acute phase of infection. The lowest concentration of SARS-CoV-2 viral copies this assay can detect is 250 copies / mL. A negative result does not preclude SARS-CoV-2 infection and should not be used as the sole basis for treatment or other patient management decisions.  A negative result may occur with improper specimen collection / handling, submission of specimen other than nasopharyngeal swab, presence of viral mutation(s) within the areas targeted by this assay, and inadequate  number of viral copies (<250 copies / mL). A negative result must be combined with clinical observations, patient history, and epidemiological information.  Fact Sheet for Patients:   StrictlyIdeas.no  Fact Sheet for Healthcare Providers: BankingDealers.co.za  This test is not yet approved or  cleared by the [place] FDA and has been authorized for detection and/or diagnosis of SARS-CoV-2 by FDA under an  Emergency Use Authorization (EUA).  This EUA will remain in effect (meaning this test can be used) for the duration of the COVID-19 declaration under Section 564(b)(1) of the Act, 21 U.S.C. section 360bbb-3(b)(1), unless the authorization is terminated or revoked sooner.  Performed at [facility], [address], [place], [place] [address]   MRSA PCR Screening     Status: None   Collection Time: 01/22/20  7:39 PM   Specimen: Nasopharyngeal  Result Value Ref Range Status   MRSA by PCR NEGATIVE NEGATIVE Final    Comment:        The GeneXpert MRSA Assay (FDA approved for NASAL specimens only), is one component of a comprehensive MRSA colonization surveillance program. It is not intended to diagnose MRSA infection nor to guide or monitor treatment for MRSA infections. Performed at [facility], [address] [place], [place] [address]    Today   Subjective    Courtney Tucker today has no new complaints  stable, neuro and cognitively appears back to baseline          Patient has been seen and examined prior to discharge   Objective   Blood pressure (!) 106/41, pulse 65, temperature 98 F (36.7 C), temperature source Oral, resp. rate 16, height _0  (1.575 m), weight 80.9 kg, SpO2 96 %.   Intake/Output Summary (Last 24 hours) at 01/23/2020 1549 Last data filed at 01/23/2020 1000 Gross per 24 hour  Intake 3 ml  Output 602 ml  Net -599 ml   Exam Physical Examination: General appearance -in no acute distress  mental status - stable, neuro and cognitively appears back to baseline  eyes - sclera anicteric Neck - supple, no JVD elevation , Chest - clear  to auscultation bilaterally, symmetrical air movement,  Heart - S1 and S2 normal, regular  Abdomen - soft, nontender, nondistended, no masses or organomegaly Neurological -facial droop, hypophonia, dysarthria, bilateral lower extremity weakness left more than right, left upper and left lower extremity numbness,  tremors Extremities - no pedal edema noted, intact peripheral pulses  Skin - warm, dry mental status -alert and oriented, appropriate eyes - sclera anicteric Neck - supple, no JVD elevation , Chest - clear  to auscultation bilaterally, symmetrical air movement,  Heart - S1 and S2 normal, regular  Abdomen - soft, nontender, nondistended, no masses or organomegaly Neurological -facial droop which appears chronic, hypophonia which appears to be chronic,  bilateral lower extremity weakness left more than right, left upper and left lower extremity numbness, tremors - stable, neuro and cognitively appears back to baseline  Extremities - no pedal edema noted, intact peripheral pulses  Skin - warm, dry   Data Review   CBC w Diff:  Lab Results  Component Value Date   WBC 6.6 01/23/2020   HGB 14.5 01/23/2020   HCT 44.0 01/23/2020   PLT 254 01/23/2020   LYMPHOPCT 37 01/22/2020   MONOPCT 8 01/22/2020   EOSPCT 1 01/22/2020   BASOPCT 1 01/22/2020    CMP:  Lab Results  Component Value Date   NA 138 01/23/2020   K 3.5 01/23/2020  CL 102 01/23/2020   CO2 28 01/23/2020   BUN 13 01/23/2020   CREATININE 0.77 01/23/2020   PROT 7.3 01/22/2020   ALBUMIN 3.9 01/22/2020   BILITOT 0.6 01/22/2020   ALKPHOS 112 01/22/2020   AST 17 01/22/2020   ALT 7 01/22/2020  .   Total Discharge time is about 33 minutes  Roxan Hockey M.D on 01/23/2020 at 3:49 PM  Go to [email or web address] -  for contact info  [facility] - Office  [phone]

## 2020-01-23 NOTE — Progress Notes (Signed)
*  PRELIMINARY RESULTS* Echocardiogram 2D Echocardiogram has been performed.  Courtney Tucker 01/23/2020, 3:33 PM

## 2020-01-23 NOTE — NC FL2 (Signed)
[place] LEVEL OF CARE SCREENING TOOL     IDENTIFICATION  Patient Name: Courtney Tucker Birthdate: 03-22-1946 Sex: female Admission Date (Current Location): 01/22/2020  [facility] [place] Number:  [employer] and Address:  [facility] [address] [place]      Provider Number: [id number]  Attending Physician Name and Address:  Roxan Hockey, MD  Relative Name and Phone Number:  Patriciaann Clan [phone]    Current Level of Care: Hospital Recommended Level of Care: [place] Prior Approval Number:    Date Approved/Denied:   PASRR Number:    Discharge Plan: Other (Comment) ([facility])    Current Diagnoses: Patient Active Problem List   Diagnosis Date Noted  . Acute metabolic encephalopathy 73/42/8768  . H/o COVID-19 virus infection--December 2020 01/22/2020  . Syncope 03/30/2019  . TIA (transient ischemic attack) 03/29/2019  . 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 OAB (overactive bladder) 07/11/2015  . Parkinson's disease ([place]) 02/23/2015  . Encounter for screening colonoscopy 11/14/2013  . Dysphagia, unspecified(787.20) 11/14/2013  . Parkinson disease ([place]) 08/05/2013  . Depression, major 07/25/2013  . Anxiety 12/30/2012  . Paralysis agitans ([place]) 10/15/2012  . 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 Abnormality of gait 10/15/2012  . Dysphasia 10/15/2012  . HYPERLIPIDEMIA-MIXED 02/14/2009  . Essential hypertension 02/14/2009  . DYSPNEA 02/14/2009  . CHEST PAIN-UNSPECIFIED 02/14/2009    Orientation RESPIRATION BLADDER Height & Weight     Place, Self  Normal External catheter Weight: 80.9 kg Height:  5' 2"  (157.5 cm)  BEHAVIORAL SYMPTOMS/MOOD NEUROLOGICAL BOWEL NUTRITION STATUS      Incontinent Diet  AMBULATORY STATUS COMMUNICATION OF NEEDS Skin   Extensive Assist Verbally Normal                       Personal Care Assistance Level of Assistance  Bathing,  Feeding, Dressing Bathing Assistance: Maximum assistance Feeding assistance: Maximum assistance Dressing Assistance: Maximum assistance     Functional Limitations Info  Sight, Hearing, Speech Sight Info: Impaired Hearing Info: Adequate Speech Info: Impaired    SPECIAL CARE FACTORS FREQUENCY  PT (By licensed PT)     PT Frequency: 5 times a week- [name]              Contractures Contractures Info: Not present    Additional Factors Info  Code Status, Allergies Code Status Info: Full Allergies Info: Oxycodone, Aspirin, Codeine           Current Medications (01/23/2020):  This is the current hospital active medication list Current Facility-Administered Medications  Medication Dose Route Frequency Provider Last Rate Last Admin  . 0.9 %  sodium chloride infusion  250 mL Intravenous PRN Emokpae, Courage, MD      . acetaminophen (TYLENOL) tablet 500 mg  500 mg Oral Q6H PRN Emokpae, Courage, MD      . albuterol (PROVENTIL) (2.5 MG/3ML) 0.083% nebulizer solution 3 mL  3 mL Nebulization Q6H PRN Emokpae, Courage, MD      . ALPRAZolam Duanne Moron) tablet 0.5 mg  0.5 mg Oral BID PRN Emokpae, Courage, MD      . amLODipine (NORVASC) tablet 10 mg  10 mg Oral Daily Emokpae, Courage, MD   10 mg at 01/23/20 1034  . atorvastatin (LIPITOR) tablet 40 mg  40 mg Oral Daily Emokpae, Courage, MD   40 mg at 01/23/20 1034  . clopidogrel (PLAVIX) tablet 75 mg  75 mg Oral Daily Emokpae,  Courage, MD   75 mg at 01/23/20 1034  . DULoxetine (CYMBALTA) DR capsule 60 mg  60 mg Oral BID Emokpae, Courage, MD   60 mg at 01/23/20 1034  . heparin injection 5,000 Units  5,000 Units Subcutaneous Q8H Roxan Hockey, MD   5,000 Units at 01/23/20 0510  . HYDROcodone-acetaminophen (NORCO/VICODIN) 5-325 MG per tablet 1 tablet  1 tablet Oral Q6H PRN Roxan Hockey, MD   1 tablet at 01/23/20 1449  . labetalol (NORMODYNE) injection 10 mg  10 mg Intravenous Q4H PRN Emokpae, Courage, MD      . losartan (COZAAR)  tablet 100 mg  100 mg Oral Daily Emokpae, Courage, MD   100 mg at 01/23/20 1034  . mometasone-formoterol (DULERA) 200-5 MCG/ACT inhaler 2 puff  2 puff Inhalation BID Roxan Hockey, MD   2 puff at 01/23/20 0809  . ondansetron (ZOFRAN) tablet 4 mg  4 mg Oral Q6H PRN Emokpae, Courage, MD       Or  . ondansetron (ZOFRAN) injection 4 mg  4 mg Intravenous Q6H PRN Emokpae, Courage, MD      . oxybutynin (DITROPAN) tablet 5 mg  5 mg Oral BID D[place] Brick, Courage, MD   5 mg at 01/23/20 1034  . pantoprazole (PROTONIX) EC tablet 40 mg  40 mg Oral Daily Emokpae, Courage, MD   40 mg at 01/23/20 1034  . polyethylene glycol (MIRALAX / GLYCOLAX) packet 17 g  17 g Oral Daily PRN Emokpae, Courage, MD      . pramipexole (MIRAPEX) tablet 1.5 mg  1.5 mg Oral QHS Emokpae, Courage, MD   1.5 mg at 01/22/20 2214  . sodium chloride flush (NS) 0.9 % injection 3 mL  3 mL Intravenous Q12H Emokpae, Courage, MD   3 mL at 01/23/20 1000  . sodium chloride flush (NS) 0.9 % injection 3 mL  3 mL Intravenous PRN Emokpae, Courage, MD      . traZODone (DESYREL) tablet 100 mg  100 mg Oral QHS Emokpae, Courage, MD   100 mg at 01/22/20 2214  . Vitamin D3 (Vitamin D) tablet 2,000 Units  2,000 Units Oral Daily Roxan Hockey, MD   2,000 Units at 01/23/20 1034     Discharge Medications:  Medication List    STOP taking these medications   acetaminophen-codeine 300-30 MG tablet Commonly known as: TYLENOL #3     TAKE these medications   acetaminophen 500 MG tablet Commonly known as: TYLENOL Take 500 mg by mouth every 6 (six) hours as needed for mild pain or moderate pain.   albuterol 108 (90 Base) MCG/ACT inhaler Commonly known as: VENTOLIN HFA Inhale 2 puffs into the lungs every 6 (six) hours as needed for wheezing or shortness of breath.   ALPRAZolam 0.5 MG tablet Commonly known as: XANAX Take 1 tablet (0.5 mg total) by mouth 2 (two) times daily as needed.   amLODipine 10 MG tablet Commonly known as: NORVASC Take 10  mg by mouth daily.   atorvastatin 40 MG tablet Commonly known as: LIPITOR Take 1 tablet (40 mg total) by mouth daily. Start taking on: January 24, 2020   Breztri Aerosphere 160-9-4.8 MCG/ACT Aero Generic drug: Budeson-Glycopyrrol-Formoterol   Carbidopa-Levodopa ER 25-100 MG tablet controlled release Commonly known as: SINEMET CR Take 1 tablet by mouth every morning.   chlorhexidine 0.12 % solution Commonly known as: PERIDEX 15 mLs by Mouth Rinse route 2 (two) times daily.   clopidogrel 75 MG tablet Commonly known as: PLAVIX Take 75 mg by mouth daily.  docusate sodium 100 MG capsule Commonly known as: COLACE Take 100 mg by mouth every other day.   DULoxetine 60 MG capsule Commonly known as: CYMBALTA Take 60 mg by mouth 2 (two) times daily.   HYDROcodone-acetaminophen 5-325 MG tablet Commonly known as: NORCO/VICODIN Take 1 tablet by mouth every 6 (six) hours as needed for moderate pain. What changed:   when to take this  reasons to take this   losartan 100 MG tablet Commonly known as: COZAAR Take 100 mg by mouth daily.   Narcan 4 MG/0.1ML Liqd nasal spray kit Generic drug: naloxone Place 1 spray into the nose once.   omeprazole 20 MG capsule Commonly known as: PRILOSEC Take 20 mg by mouth in the morning and at bedtime.   oxybutynin 5 MG tablet Commonly known as: DITROPAN Take 5 mg by mouth 2 (two) times daily.   polyethylene glycol 17 g packet Commonly known as: MIRALAX / GLYCOLAX Take 17 g by mouth daily as needed.   pramipexole 1 MG tablet Commonly known as: MIRAPEX Take 1.5 mg by mouth at bedtime.   sodium phosphate Pediatric 3.5-9.5 GM/59ML enema Place 1 enema rectally once as needed for severe constipation.   traZODone 150 MG tablet Commonly known as: DESYREL Take 150 mg by mouth at bedtime.   Vitamin D 50 MCG (2000 UT) tablet Take 2,000 Units by mouth daily.       Relevant Imaging Results:  Relevant Lab  Results:   Additional Information SS# [id number]  Boneta Lucks, RN

## 2020-01-23 NOTE — Discharge Instructions (Signed)
1)Please follow-up with Neurologist Dr. Phillips Odor-- Phone: [phone], Address: [address], [place], [place] [address] in 4 to 6 weeks for recheck and reevaluation.  Please call to make appointment with him  2) neurologist Dr. Phillips Odor may be able to order outpatient EEG test for you

## 2020-01-25 ENCOUNTER — Other Ambulatory Visit (HOSPITAL_COMMUNITY): Payer: Self-pay | Admitting: Neurosurgery

## 2020-01-25 DIAGNOSIS — M5412 Radiculopathy, cervical region: Secondary | ICD-10-CM

## 2020-01-25 DIAGNOSIS — M5416 Radiculopathy, lumbar region: Secondary | ICD-10-CM

## 2020-02-08 ENCOUNTER — Ambulatory Visit
Admission: RE | Admit: 2020-02-08 | Discharge: 2020-02-08 | Disposition: A | Discharge status: Home / Self Care | Payer: Medicare Other | Source: Ambulatory Visit | Attending: Neurosurgery | Admitting: Neurosurgery

## 2020-02-08 ENCOUNTER — Other Ambulatory Visit: Payer: Self-pay

## 2020-02-08 DIAGNOSIS — M5412 Radiculopathy, cervical region: Secondary | ICD-10-CM

## 2020-02-08 DIAGNOSIS — M5416 Radiculopathy, lumbar region: Secondary | ICD-10-CM

## 2020-02-08 MED ORDER — IOPAMIDOL (ISOVUE-M 300) INJECTION 61%
10.0000 mL | Freq: Once | INTRAMUSCULAR | Status: AC | PRN
  Administered 2020-02-08: 10 mL via INTRATHECAL

## 2020-02-08 NOTE — Discharge Instructions (Signed)
Myelogram Discharge Instructions  1. Go home and rest quietly for the next 24 hours.  It is important to lie flat for the next 24 hours.  Get up only to go to the restroom.  You may lie in the bed or on a couch on your back, your stomach, your left side or your right side.  You may have one pillow under your head.  You may have pillows between your knees while you are on your side or under your knees while you are on your back.  2. DO NOT drive today.  Recline the seat as far back as it will go, while still wearing your seat belt, on the way home.  3. You may get up to go to the bathroom as needed.  You may sit up for 10 minutes to eat.  You may resume your normal diet and medications unless otherwise indicated.  Drink lots of extra fluids today and tomorrow.  4. The incidence of headache, nausea, or vomiting is about 5% (one in 20 patients).  If you develop a headache, lie flat and drink plenty of fluids until the headache goes away.  Caffeinated beverages may be helpful.  If you develop severe nausea and vomiting or a headache that does not go away with flat bed rest, call [phone].  5. You may resume normal activities after your 24 hours of bed rest is over; however, do not exert yourself strongly or do any heavy lifting tomorrow. If when you get up you have a headache when standing, go back to bed and force fluids for another 24 hours.  6. Call your physician for a follow-up appointment.  The results of your myelogram will be sent directly to your physician by the following day.  7. If you have any questions or if complications develop after you arrive home, please call [phone].  Discharge instructions have been explained to the patient.  The patient, or the person responsible for the patient, fully understands these instructions.  YOU MAY RESTART YOUR CYMBALTA AND TRAZODONE TOMORROW 02/09/20 AT 1:00PM.

## 2020-02-08 NOTE — Progress Notes (Signed)
Patient's nurse/caretaker today, Sunday Spillers, states patient has been off Cymbalta and Trazodone for at least the past two days.

## 2020-03-12 ENCOUNTER — Encounter: Payer: Self-pay | Admitting: *Deleted

## 2020-03-12 ENCOUNTER — Telehealth: Payer: Self-pay | Admitting: Neurology

## 2020-03-12 NOTE — Telephone Encounter (Signed)
Patient was seen by movement specialist Dr.Tat at [facility] neurologist from 2014 to August 2020  She has diagnosis of idiopathic Parkinson's disease, status post deep brain stimulator placement in 2007, anxiety, depression  She is referred back by neurosurgeon Dr. Vertell Limber to adjust her deep brain stimulator, our clinic does not have capacity to do so  Previously she was referred to [facility], should continue follow-up with the previous referral, if needed new referral, please contact her primary care physician,   please cancel her appointment with me on March 13, 2020

## 2020-03-13 ENCOUNTER — Ambulatory Visit: Payer: BLUE CROSS/BLUE SHIELD | Admitting: Neurology

## 2020-03-17 NOTE — ED Provider Notes (Signed)
[facility] EMERGENCY DEPARTMENT Provider Note   CSN: [id number] Arrival date & time: 01/03/20  1734     History No chief complaint on file.   Courtney Tucker is a 74 y.o. female with a history as outlined below, significant for arthritis, GERD, HTN, h/o cva and lumbar fusion (Dr. Vertell Limber) also with an implanted nerve stimulator followed by neurology presenting from her local nursing home for further evaluation of worsening pain in her left groin which radiates into the left knee which is not being controlled by tylenol.  She reports falling to the bathroom onto her knees this am secondary to pain.  She denies weakness or numbness in her legs, also has no urinary or fecal incontinence or retention.    The history is provided by the patient.       Past Medical History:  Diagnosis Date  . Anxiety   . Arthritis   . Complication of anesthesia   . Depression   . Depression   . Dysphasia   . GERD (gastroesophageal reflux disease)   . Hyperlipidemia   . Hypertension   . Parkinson's disease ([place])    diagnosed at age 61  . PONV (postoperative nausea and vomiting)    states it has been a long time since getting sick  . Reflux   . Sleep apnea    Has CPAP but doesnt use  . Stroke ([place])   . TIA (transient ischemic attack)    not really TIA but abnormal MRI per pt left sided weakness  . UTI (lower urinary tract infection)   . Vitamin D deficiency     Patient Active Problem List   Diagnosis Date Noted  . Acute metabolic encephalopathy 73/22/0254  . H/o COVID-19 virus infection--December 2020 01/22/2020  . Syncope 03/30/2019  . TIA (transient ischemic attack) 03/29/2019  . 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 OAB (overactive bladder) 07/11/2015  . Parkinson's disease ([place]) 02/23/2015  . Encounter for screening colonoscopy 11/14/2013  . Dysphagia, unspecified(787.20) 11/14/2013  . Parkinson disease ([place]) 08/05/2013  . Depression, major 07/25/2013  . Anxiety  12/30/2012  . Paralysis agitans ([place]) 10/15/2012  . 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 Abnormality of gait 10/15/2012  . Dysphasia 10/15/2012  . HYPERLIPIDEMIA-MIXED 02/14/2009  . Essential hypertension 02/14/2009  . DYSPNEA 02/14/2009  . CHEST PAIN-UNSPECIFIED 02/14/2009    Past Surgical History:  Procedure Laterality Date  . BACK SURGERY    . Battery change     DBS, 12/2009  . BIOPSY N/A 11/29/2013   Procedure: GASTRIC BIOPSY;  Surgeon: Danie Binder, MD;  Location: [facility];  Service: Endoscopy;  Laterality: N/A;  . BURR HOLE W/ STEREOTACTIC INSERTION OF DBS LEADS / INTRAOP MICROELECTRODE RECORDING     2007  . CARPAL TUNNEL RELEASE Right   . COLONOSCOPY WITH PROPOFOL N/A 11/29/2013   Procedure: COLONOSCOPY WITH PROPOFOL;  Surgeon: Danie Binder, MD;  Location: [facility];  Service: Endoscopy;  Laterality: N/A;  entered cecum @ [phone]; total cecal withdrawal time 21 minutes   . ELBOW SURGERY Left    after fx  . ESOPHAGEAL DILATION N/A 11/29/2013   Procedure: ESOPHAGEAL DILATION;  Surgeon: Danie Binder, MD;  Location: [facility];  Service: Endoscopy;  Laterality: N/A;  [place] 14/15/16  . ESOPHAGOGASTRODUODENOSCOPY (EGD) WITH PROPOFOL N/A 11/29/2013   Procedure: ESOPHAGOGASTRODUODENOSCOPY (EGD) WITH PROPOFOL;  Surgeon: Danie Binder, MD;  Location: [facility];  Service: Endoscopy;  Laterality: N/A;  . FOOT SURGERY Left    "something with  2nd 2."  . NECK SURGERY     Fusion  . POLYPECTOMY N/A 11/29/2013   Procedure: POLYPECTOMY;  Surgeon: Danie Binder, MD;  Location: [facility];  Service: Endoscopy;  Laterality: N/A;  Distal Transverse Colon x2   . PR ANALYZE NEUROSTIM BRAIN, FIRST 1H  10/15/2012      . PULSE GENERATOR IMPLANT Right 03/30/2018   Procedure: Right Implantable pulse generator change;  Surgeon: Erline Levine, MD;  Location: [place];  Service: Neurosurgery;  Laterality: Right;  . SUBTHALAMIC STIMULATOR BATTERY REPLACEMENT N/A 08/05/2013   Procedure: SUBTHALAMIC STIMULATOR BATTERY REPLACEMENT;   Surgeon: Erline Levine, MD;  Location: [place] [facility];  Service: Neurosurgery;  Laterality: N/A;  . SUBTHALAMIC STIMULATOR BATTERY REPLACEMENT N/A 11/26/2015   Procedure: Implantable pulse generator battery change for deep brain stimulator;  Surgeon: Erline Levine, MD;  Location: [place] [facility];  Service: Neurosurgery;  Laterality: N/A;  . VAGINAL HYSTERECTOMY     "partial"  . WRIST SURGERY Right    after fx     OB History   No obstetric history on file.     Family History  Problem Relation Age of Onset  . Colon cancer Paternal Uncle     Social History   Tobacco Use  . Smoking status: Former Smoker    Packs/day: 0.50    Years: 25.00    Pack years: 12.50    Types: Cigarettes    Quit date: 03/01/2019    Years since quitting: 1.0  . Smokeless tobacco: Never Used  Vaping Use  . Vaping Use: Never used  Substance Use Topics  . Alcohol use: Not Currently  . Drug use: No    Home Medications Prior to Admission medications   Medication Sig Start Date End Date Taking? Authorizing Provider  acetaminophen (TYLENOL) 500 MG tablet Take 500 mg by mouth every 6 (six) hours as needed for mild pain or moderate pain.    [provider]  albuterol (VENTOLIN HFA) 108 (90 Base) MCG/ACT inhaler Inhale 2 puffs into the lungs every 6 (six) hours as needed for wheezing or shortness of breath. 05/02/19   Milton Ferguson, MD  ALPRAZolam Duanne Moron) 0.5 MG tablet Take 1 tablet (0.5 mg total) by mouth 2 (two) times daily as needed. 01/23/20   Roxan Hockey, MD  amLODipine (NORVASC) 10 MG tablet Take 10 mg by mouth daily.     [provider]  atorvastatin (LIPITOR) 40 MG tablet Take 1 tablet (40 mg total) by mouth daily. 01/24/20   Roxan Hockey, MD  BREZTRI AEROSPHERE 160-9-4.8 MCG/ACT AERO  09/22/19   [provider]  Carbidopa-Levodopa ER (SINEMET CR) 25-100 MG tablet controlled release Take 1 tablet by mouth every morning. 04/04/19 01/22/20  Manuella Ghazi, Pratik D, DO  chlorhexidine (PERIDEX)  0.12 % solution 15 mLs by Mouth Rinse route 2 (two) times daily. 12/22/19   [provider]  Cholecalciferol (VITAMIN D) 50 MCG (2000 UT) tablet Take 2,000 Units by mouth daily.    [provider]  clopidogrel (PLAVIX) 75 MG tablet Take 75 mg by mouth daily.    [provider]  docusate sodium (COLACE) 100 MG capsule Take 100 mg by mouth every other day.    [provider]  DULoxetine (CYMBALTA) 60 MG capsule Take 60 mg by mouth 2 (two) times daily.     [provider]  HYDROcodone-acetaminophen (NORCO/VICODIN) 5-325 MG tablet Take 1 tablet by mouth every 6 (six) hours as needed for moderate pain. 01/23/20   Roxan Hockey, MD  losartan (COZAAR) 100 MG tablet Take 100 mg by mouth daily.    [provider]  NARCAN 4 MG/0.1ML LIQD nasal spray kit Place 1 spray into the nose once. 11/22/19   [provider]  omeprazole (PRILOSEC) 20 MG capsule Take 20 mg by mouth in the morning and at bedtime.     [provider]  oxybutynin (DITROPAN) 5 MG tablet Take 5 mg by mouth 2 (two) times daily.    [provider]  polyethylene glycol (MIRALAX / GLYCOLAX) 17 g packet Take 17 g by mouth daily as needed.    [provider]  pramipexole (MIRAPEX) 1 MG tablet Take 1.5 mg by mouth at bedtime.  06/18/16   [provider]  sodium phosphate Pediatric (FLEET) 3.5-9.5 GM/59ML enema Place 1 enema rectally once as needed for severe constipation.    [provider]  traZODone (DESYREL) 150 MG tablet Take 150 mg by mouth at bedtime.     [provider]    Allergies    Oxycodone, Aspirin, and Codeine  Review of Systems   Review of Systems  Constitutional: Negative for fever.  Respiratory: Negative for shortness of breath.   Cardiovascular: Negative for chest pain and leg swelling.  Gastrointestinal: Negative for abdominal distention, abdominal pain and constipation.  Genitourinary: Negative for difficulty  urinating, dysuria, flank pain, frequency and urgency.  Musculoskeletal: Positive for arthralgias and back pain. Negative for gait problem, joint swelling and myalgias.  Skin: Negative for rash.  Neurological: Negative for weakness and numbness.  All other systems reviewed and are negative.   Physical Exam Updated Vital Signs BP (!) 98/58   Pulse 60   Temp 98.1 F (36.7 C)   Resp 16   Ht 5' 2"  (1.575 m)   Wt 83 kg   SpO2 95%   BMI 33.47 kg/m   Physical Exam Vitals and nursing note reviewed.  Constitutional:      Appearance: She is well-developed.  HENT:     Head: Normocephalic and atraumatic.  Eyes:     Conjunctiva/sclera: Conjunctivae normal.  Cardiovascular:     Rate and Rhythm: Normal rate and regular rhythm.     Heart sounds: Normal heart sounds.  Pulmonary:     Effort: Pulmonary effort is normal.     Breath sounds: Normal breath sounds. No wheezing.  Abdominal:     General: Bowel sounds are normal.     Palpations: Abdomen is soft.     Tenderness: There is no abdominal tenderness.  Musculoskeletal:        General: No swelling or deformity. Normal range of motion.     Cervical back: Normal range of motion.     Lumbar back: Bony tenderness present. No swelling.     Comments: ttp midline lumbar spine, no deformity.   Skin:    General: Skin is warm and dry.  Neurological:     Mental Status: She is alert.     Sensory: Sensation is intact. No sensory deficit.     ED Results / Procedures / Treatments   Labs (all labs ordered are listed, but only abnormal results are displayed)   EKG None  Radiology   Narrative & Impression  CLINICAL DATA:  Fall, back pain  EXAM: LUMBAR SPINE - COMPLETE 4+ VIEW  COMPARISON:  None.  FINDINGS: There is slightly accentuated lumbar lordosis. L4-5 anterior and posterior lumbar fusion with instrumentation is again noted. No acute fracture or listhesis of the lumbar spine. There is intervertebral disc  space narrowing  and endplate remodeling of W09-W1, most severe at L1-2, in keeping with changes of moderate to severe degenerative disc disease. There is superimposed 7 mm retrolisthesis of L1 upon L2, likely degenerative in nature. The osseous structures are diffusely mildly osteopenic. The paraspinal soft tissues are unremarkable.  IMPRESSION: No acute fracture or traumatic listhesis.   Electronically Signed   By: Fidela Salisbury MD   On: 01/03/2020 19:04   CLINICAL DATA:  Fall, left knee pain  EXAM: LEFT KNEE - COMPLETE 4+ VIEW  COMPARISON:  None.  FINDINGS: No evidence of fracture, dislocation, or joint effusion. No evidence of arthropathy or other focal bone abnormality. Soft tissues are unremarkable.  IMPRESSION: Negative.   Electronically Signed   By: Fidela Salisbury MD   On: 01/03/2020 19:05 CLINICAL DATA:  Fall, left hip pain  EXAM: DG HIP (WITH OR WITHOUT PELVIS) 2-3V LEFT  COMPARISON:  None.  FINDINGS: There is no evidence of hip fracture or dislocation. There is no evidence of arthropathy or other focal bone abnormality.  IMPRESSION: Negative.   Electronically Signed   By: Fidela Salisbury MD   On: 01/03/2020 19:05   CT HIP  CLINICAL DATA:  Left hip pain, fall  EXAM: CT OF THE LEFT HIP WITHOUT CONTRAST  TECHNIQUE: Multidetector CT imaging of the left hip was performed according to the standard protocol. Multiplanar CT image reconstructions were also generated.  COMPARISON:  Plain films earlier today  FINDINGS: Layering high-density material within the left posterior bladder lumen, presumably bladder stones. Visualized pelvic large and small bowel unremarkable. Prior hysterectomy. No free fluid.  No acute bony abnormality. No fracture, subluxation or dislocation within the left hip.   IMPRESSION: No acute bony abnormality.  Small layering stones within the bladder.   Electronically Signed   By: Rolm Baptise M.D.   On:  01/03/2020 21:11  CT LUMBAR  CLINICAL DATA:  Lower back pain, question fracture  EXAM: CT LUMBAR SPINE WITHOUT CONTRAST  TECHNIQUE: Multidetector CT imaging of the lumbar spine was performed without intravenous contrast administration. Multiplanar CT image reconstructions were also generated.  COMPARISON:  Radiograph same day  FINDINGS: Segmentation: There are 5 non-rib bearing lumbar type vertebral bodies with the last intervertebral disc space labeled as L5-S1.  Alignment: There is a mild leftward curvature of the lumbar spine. There is minimal retrolisthesis of T12 on L1-L1 on L2 and L2 on L3.  Vertebrae: The patient is status post fixation at L4-L5 with transpedicular rod and screw fixation. No periprosthetic lucency is seen. There is a left-sided interbody fusion device noted. No definite subsidence is seen.  Paraspinal and other soft tissues: Scattered punctate calcifications seen at the upper pole of the right kidney. The sacroiliac joints are intact.  Disc levels:  T12-L1:  No significant canal or neural foraminal narrowing.  L1-L2: There is facet arthrosis and a broad-based disc bulge which causes moderate to severe right and moderate left neural foraminal narrowing.  L2-L3: There is a broad-based disc bulge and facet arthrosis which causes severe bilateral neural foraminal narrowing and mild central canal stenosis.  L3-L4: Facet arthrosis and a broad-based disc bulge is present which causes moderate to severe bilateral neural foraminal narrowing and moderate central canal stenosis.  L4-L5:  No significant canal or neural foraminal narrowing.  L5-S1:   No significant canal or neural foraminal narrowing.  IMPRESSION: No definite acute fracture seen.  Status post fixation at L4-L5 with a left-sided interbody fusion. No definite hardware complication.  Minimal retrolisthesis from T12 through L3.  Lumbar spine spondylosis most notable  at L3-L4 with moderate to severe bilateral neural foraminal narrowing and moderate central canal stenosis.   Electronically Signed   By: Prudencio Pair M.D.   On: 01/03/2020 21:24      No results found.    Procedures Procedures (including critical care time)  Medications Ordered in ED Medications  fentaNYL (SUBLIMAZE) injection 50 mcg (50 mcg Intravenous Given 01/03/20 1826)  ondansetron (ZOFRAN) injection 4 mg (4 mg Intravenous Given 01/03/20 1826)  HYDROmorphone (DILAUDID) injection 1 mg (1 mg Intravenous Given 01/03/20 1955)    ED Course  I have reviewed the triage vital signs and the nursing notes.  Pertinent labs & imaging results that were available during my care of the patient were reviewed by me and considered in my medical decision making (see chart for details).    MDM Rules/Calculators/A&P                          Imaging reviewed and discussed with no acute findings of low back, hip/knee to suggest new injury.  She was given fentanyl which did not improve her pain,  Added dilaudid after which she had improved pain relief.  She was recommended f/u with ortho and with Dr. Vertell Limber since he has seen her in the past.  No neuro deficit on exam or by history to suggest emergent or surgical presentation.  Also discussed worsened sx that should prompt immediate re-evaluation including distal weakness, bowel/bladder retention/incontinence. Pt seen by Dr. Rogene Houston during this visit.   Final Clinical Impression(s) / ED Diagnoses Final diagnoses:  Chronic left-sided low back pain with left-sided sciatica    Rx / DC Orders ED Discharge Orders         Ordered    HYDROcodone-acetaminophen (NORCO/VICODIN) 5-325 MG tablet  Every 4 hours PRN,   Status:  Discontinued        01/03/20 2228    HYDROcodone-acetaminophen (NORCO/VICODIN) 5-325 MG tablet  Every 4 hours PRN,   Status:  Discontinued        01/03/20 2244           Evalee Jefferson, PA-C 03/17/20 1024    Fredia Sorrow, MD 03/17/20 1329

## 2020-05-10 ENCOUNTER — Emergency Department (HOSPITAL_COMMUNITY)
Admission: EM | Admit: 2020-05-10 | Discharge: 2020-05-11 | Disposition: A | Discharge status: Home / Self Care | Payer: Medicare Other | Attending: Emergency Medicine | Admitting: Emergency Medicine

## 2020-05-10 ENCOUNTER — Encounter (HOSPITAL_COMMUNITY): Payer: Self-pay | Admitting: Emergency Medicine

## 2020-05-10 ENCOUNTER — Emergency Department (HOSPITAL_COMMUNITY): Payer: Medicare Other

## 2020-05-10 ENCOUNTER — Other Ambulatory Visit: Payer: Self-pay

## 2020-05-10 DIAGNOSIS — Z20822 Contact with and (suspected) exposure to covid-19: Secondary | ICD-10-CM | POA: Insufficient documentation

## 2020-05-10 DIAGNOSIS — Z8616 Personal history of COVID-19: Secondary | ICD-10-CM | POA: Insufficient documentation

## 2020-05-10 DIAGNOSIS — Z8673 Personal history of transient ischemic attack (TIA), and cerebral infarction without residual deficits: Secondary | ICD-10-CM | POA: Insufficient documentation

## 2020-05-10 DIAGNOSIS — Z87891 Personal history of nicotine dependence: Secondary | ICD-10-CM | POA: Insufficient documentation

## 2020-05-10 DIAGNOSIS — W19XXXA Unspecified fall, initial encounter: Secondary | ICD-10-CM

## 2020-05-10 DIAGNOSIS — M25551 Pain in right hip: Secondary | ICD-10-CM | POA: Insufficient documentation

## 2020-05-10 DIAGNOSIS — Z79899 Other long term (current) drug therapy: Secondary | ICD-10-CM | POA: Insufficient documentation

## 2020-05-10 DIAGNOSIS — B349 Viral infection, unspecified: Secondary | ICD-10-CM | POA: Insufficient documentation

## 2020-05-10 DIAGNOSIS — I10 Essential (primary) hypertension: Secondary | ICD-10-CM | POA: Insufficient documentation

## 2020-05-10 DIAGNOSIS — G20 Parkinson's disease: Secondary | ICD-10-CM | POA: Diagnosis not present

## 2020-05-10 DIAGNOSIS — R519 Headache, unspecified: Secondary | ICD-10-CM | POA: Diagnosis present

## 2020-05-10 DIAGNOSIS — M791 Myalgia, unspecified site: Secondary | ICD-10-CM

## 2020-05-10 LAB — CBC WITH DIFFERENTIAL/PLATELET
Abs Immature Granulocytes: 0.02 10*3/uL (ref 0.00–0.07)
Basophils Absolute: 0 10*3/uL (ref 0.0–0.1)
Basophils Relative: 1 %
Eosinophils Absolute: 0.3 10*3/uL (ref 0.0–0.5)
Eosinophils Relative: 4 %
HCT: 44.7 % (ref 36.0–46.0)
Hemoglobin: 14.8 g/dL (ref 12.0–15.0)
Immature Granulocytes: 0 %
Lymphocytes Relative: 28 %
Lymphs Abs: 1.7 10*3/uL (ref 0.7–4.0)
MCH: 31.4 pg (ref 26.0–34.0)
MCHC: 33.1 g/dL (ref 30.0–36.0)
MCV: 94.9 fL (ref 80.0–100.0)
Monocytes Absolute: 0.6 10*3/uL (ref 0.1–1.0)
Monocytes Relative: 10 %
Neutro Abs: 3.5 10*3/uL (ref 1.7–7.7)
Neutrophils Relative %: 57 %
Platelets: 251 10*3/uL (ref 150–400)
RBC: 4.71 MIL/uL (ref 3.87–5.11)
RDW: 12.9 % (ref 11.5–15.5)
WBC: 6.2 10*3/uL (ref 4.0–10.5)
nRBC: 0 % (ref 0.0–0.2)

## 2020-05-10 LAB — COMPREHENSIVE METABOLIC PANEL
ALT: 11 U/L (ref 0–44)
AST: 15 U/L (ref 15–41)
Albumin: 3.9 g/dL (ref 3.5–5.0)
Alkaline Phosphatase: 146 U/L — ABNORMAL HIGH (ref 38–126)
Anion gap: 9 (ref 5–15)
BUN: 14 mg/dL (ref 8–23)
CO2: 27 mmol/L (ref 22–32)
Calcium: 8.9 mg/dL (ref 8.9–10.3)
Chloride: 104 mmol/L (ref 98–111)
Creatinine, Ser: 0.67 mg/dL (ref 0.44–1.00)
GFR, Estimated: >60 mL/min (ref >60)
Glucose, Bld: 122 mg/dL — ABNORMAL HIGH (ref 70–99)
Potassium: 3.5 mmol/L (ref 3.5–5.1)
Sodium: 140 mmol/L (ref 135–145)
Total Bilirubin: 0.6 mg/dL (ref 0.3–1.2)
Total Protein: 7.3 g/dL (ref 6.5–8.1)

## 2020-05-10 LAB — URINALYSIS, ROUTINE W REFLEX MICROSCOPIC
Bilirubin Urine: NEGATIVE
Glucose, UA: NEGATIVE mg/dL
Hgb urine dipstick: NEGATIVE
Ketones, ur: NEGATIVE mg/dL
Leukocytes,Ua: NEGATIVE
Nitrite: NEGATIVE
Protein, ur: NEGATIVE mg/dL
Specific Gravity, Urine: 1.013 (ref 1.005–1.030)
pH: 7 (ref 5.0–8.0)

## 2020-05-10 LAB — RESP PANEL BY RT-PCR (FLU A&B, COVID) ARPGX2
Influenza A by PCR: NEGATIVE
Influenza B by PCR: NEGATIVE
SARS Coronavirus 2 by RT PCR: NEGATIVE

## 2020-05-10 LAB — TROPONIN I (HIGH SENSITIVITY): Troponin I (High Sensitivity): <2 ng/L (ref <18)

## 2020-05-10 MED ORDER — TRAZODONE HCL 50 MG PO TABS
150.0000 mg | ORAL_TABLET | Freq: Every day | ORAL | Status: DC
  Administered 2020-05-10: 150 mg via ORAL
  Filled 2020-05-10: qty 3

## 2020-05-10 MED ORDER — SODIUM CHLORIDE 0.9 % IV BOLUS: 500.0000 mL | Freq: Once | INTRAVENOUS | Status: DC

## 2020-05-10 MED ORDER — HYDROCODONE-ACETAMINOPHEN 5-325 MG PO TABS
1.0000 | ORAL_TABLET | Freq: Once | ORAL | Status: AC
  Administered 2020-05-10: 1 via ORAL
  Filled 2020-05-10: qty 1

## 2020-05-10 MED ORDER — HYDROCODONE-ACETAMINOPHEN 7.5-325 MG/15ML PO SOLN: 10.0000 mL | Freq: Once | ORAL | Status: DC

## 2020-05-10 NOTE — Discharge Instructions (Signed)
Your work-up in the emergency department showed completely normal blood work.  Your Covid and flu test were negative.  We do not see signs of infection in your urine.  Your troponin heart enzyme levels were normal.  You did not show signs of anemia or dehydration.  Your xray of the hip (from your hip pain from the fall) did not show a fracture or broken bone.  I suspect your pain may be due to a viral syndrome.  This should typically improve over the next 7 days.  Please follow-up with your facility's doctor.  A referral was placed with the neurology group about setting up an appointment for your Parkinson's.  *  Norco 5 mg and trazodone 150 mg were given here in the ER per her evening medications.  No other home meds were given here.

## 2020-05-10 NOTE — ED Triage Notes (Addendum)
Pt from [place]. Per facility pt reports "feeling like I have the flu." Pt reports cough and congestion. Pt at her baseline.

## 2020-05-10 NOTE — ED Provider Notes (Signed)
[facility] EMERGENCY DEPARTMENT Provider Note   CSN: [id number] Arrival date & time: 05/10/20  1801     History Chief Complaint  Patient presents with  . Weakness    Courtney Tucker is a 74 y.o. female w/ hx of CVA w/ chronic dysarthria, Parkinson's disease with DBS, presenting to the ED from a nursing facility with concerns for myalgias, headache and fatigue.  The patient's sister is present at the bedside to provide supplemental history.  The patient reports 1-2 days of generalized fatigue, diffuse myalgias, headache, and nausea.  She reports she is essentially wheel-chair bound and only stands with assistance/PT, but fell out of her wheelchair yesterday while attempting to stand and fell onto her right hip.  Her right hip and right lower back is sore now.    She also reports throbbing frontal headache and muscle aches "all over" and states "I hurt from my head to my toes."  She denies head trauma from her fall.  She is on chronic pain medications at her facility.  Her sister tells me they haven't been able to see a neurologist in "about two years" for her Parkinson's, which is advanced.  Their facility is attempting to make an office arrangement but did not want to transfer the pt to [facility] to see a neurologist due to distance.    There are sick residents in the facility with similar symptoms according to her sister.  The patient had Covid-19 infection in December 2020.  She gets tested twice weekly at her facility for this.  HPI     Past Medical History:  Diagnosis Date  . Anxiety   . Arthritis   . Complication of anesthesia   . Depression   . Depression   . Dysphasia   . GERD (gastroesophageal reflux disease)   . Hyperlipidemia   . Hypertension   . Parkinson's disease ([place])    diagnosed at age 61  . PONV (postoperative nausea and vomiting)    states it has been a long time since getting sick  . Reflux   . Sleep apnea    Has CPAP but doesnt use  . Stroke ([place])    . TIA (transient ischemic attack)    not really TIA but abnormal MRI per pt left sided weakness  . UTI (lower urinary tract infection)   . Vitamin D deficiency     Patient Active Problem List   Diagnosis Date Noted  . Acute metabolic encephalopathy 40/02/2724  . H/o COVID-19 virus infection--December 2020 01/22/2020  . Syncope 03/30/2019  . TIA (transient ischemic attack) 03/29/2019  . 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 OAB (overactive bladder) 07/11/2015  . Parkinson's disease ([place]) 02/23/2015  . Encounter for screening colonoscopy 11/14/2013  . Dysphagia, unspecified(787.20) 11/14/2013  . Parkinson disease ([place]) 08/05/2013  . Depression, major 07/25/2013  . Anxiety 12/30/2012  . Paralysis agitans ([place]) 10/15/2012  . 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 Abnormality of gait 10/15/2012  . Dysphasia 10/15/2012  . HYPERLIPIDEMIA-MIXED 02/14/2009  . Essential hypertension 02/14/2009  . DYSPNEA 02/14/2009  . CHEST PAIN-UNSPECIFIED 02/14/2009    Past Surgical History:  Procedure Laterality Date  . BACK SURGERY    . Battery change     DBS, 12/2009  . BIOPSY N/A 11/29/2013   Procedure: GASTRIC BIOPSY;  Surgeon: Danie Binder, MD;  Location: [facility];  Service: Endoscopy;  Laterality: N/A;  . BURR HOLE W/ STEREOTACTIC INSERTION OF DBS LEADS / INTRAOP MICROELECTRODE RECORDING     2007  .  CARPAL TUNNEL RELEASE Right   . COLONOSCOPY WITH PROPOFOL N/A 11/29/2013   Procedure: COLONOSCOPY WITH PROPOFOL;  Surgeon: Danie Binder, MD;  Location: [facility];  Service: Endoscopy;  Laterality: N/A;  entered cecum @ [phone]; total cecal withdrawal time 21 minutes   . ELBOW SURGERY Left    after fx  . ESOPHAGEAL DILATION N/A 11/29/2013   Procedure: ESOPHAGEAL DILATION;  Surgeon: Danie Binder, MD;  Location: [facility];  Service: Endoscopy;  Laterality: N/A;  [place] 14/15/16  . ESOPHAGOGASTRODUODENOSCOPY (EGD) WITH PROPOFOL N/A 11/29/2013   Procedure: ESOPHAGOGASTRODUODENOSCOPY (EGD) WITH  PROPOFOL;  Surgeon: Danie Binder, MD;  Location: [facility];  Service: Endoscopy;  Laterality: N/A;  . FOOT SURGERY Left    "something with 2nd 2."  . NECK SURGERY     Fusion  . POLYPECTOMY N/A 11/29/2013   Procedure: POLYPECTOMY;  Surgeon: Danie Binder, MD;  Location: [facility];  Service: Endoscopy;  Laterality: N/A;  Distal Transverse Colon x2   . PR ANALYZE NEUROSTIM BRAIN, FIRST 1H  10/15/2012      . PULSE GENERATOR IMPLANT Right 03/30/2018   Procedure: Right Implantable pulse generator change;  Surgeon: Erline Levine, MD;  Location: [place];  Service: Neurosurgery;  Laterality: Right;  . SUBTHALAMIC STIMULATOR BATTERY REPLACEMENT N/A 08/05/2013   Procedure: SUBTHALAMIC STIMULATOR BATTERY REPLACEMENT;  Surgeon: Erline Levine, MD;  Location: [place] [facility];  Service: Neurosurgery;  Laterality: N/A;  . SUBTHALAMIC STIMULATOR BATTERY REPLACEMENT N/A 11/26/2015   Procedure: Implantable pulse generator battery change for deep brain stimulator;  Surgeon: Erline Levine, MD;  Location: [place] [facility];  Service: Neurosurgery;  Laterality: N/A;  . VAGINAL HYSTERECTOMY     "partial"  . WRIST SURGERY Right    after fx     OB History   No obstetric history on file.     Family History  Problem Relation Age of Onset  . Colon cancer Paternal Uncle     Social History   Tobacco Use  . Smoking status: Former Smoker    Packs/day: 0.50    Years: 25.00    Pack years: 12.50    Types: Cigarettes    Quit date: 03/01/2019    Years since quitting: 1.1  . Smokeless tobacco: Never Used  Vaping Use  . Vaping Use: Never used  Substance Use Topics  . Alcohol use: Not Currently  . Drug use: No    Home Medications Prior to Admission medications   Medication Sig Start Date End Date Taking? Authorizing Provider  acetaminophen (TYLENOL) 500 MG tablet Take 500 mg by mouth every 6 (six) hours as needed for mild pain or moderate pain.   Yes [provider]  albuterol (VENTOLIN HFA) 108 (90 Base) MCG/ACT  inhaler Inhale 2 puffs into the lungs every 6 (six) hours as needed for wheezing or shortness of breath. 05/02/19  Yes Milton Ferguson, MD  ALPRAZolam Duanne Moron) 0.5 MG tablet Take 1 tablet (0.5 mg total) by mouth 2 (two) times daily as needed. 01/23/20  Yes Emokpae, Courage, MD  amLODipine (NORVASC) 10 MG tablet Take 10 mg by mouth daily.    Yes [provider]  atorvastatin (LIPITOR) 40 MG tablet Take 1 tablet (40 mg total) by mouth daily. 01/24/20  Yes Roxan Hockey, MD  BREZTRI AEROSPHERE 160-9-4.8 MCG/ACT AERO  09/22/19  Yes [provider]  Carbidopa-Levodopa ER (SINEMET CR) 25-100 MG tablet controlled release Take 1 tablet by mouth every morning. 04/04/19 01/22/20 Yes Shah, Pratik D, DO  chlorhexidine ([place])  0.12 % solution 15 mLs by Mouth Rinse route 2 (two) times daily. 12/22/19  Yes [provider]  Cholecalciferol (VITAMIN D) 50 MCG (2000 UT) tablet Take 2,000 Units by mouth daily.   Yes [provider]  clopidogrel (PLAVIX) 75 MG tablet Take 75 mg by mouth daily.   Yes [provider]  docusate sodium (COLACE) 100 MG capsule Take 100 mg by mouth every other day.   Yes [provider]  DULoxetine (CYMBALTA) 60 MG capsule Take 60 mg by mouth 2 (two) times daily.    Yes [provider]  losartan (COZAAR) 100 MG tablet Take 100 mg by mouth daily.   Yes [provider]  NARCAN 4 MG/0.1ML LIQD nasal spray kit Place 1 spray into the nose once. 11/22/19  Yes [provider]  omeprazole (PRILOSEC) 20 MG capsule Take 20 mg by mouth in the morning and at bedtime.    Yes [provider]  oxybutynin (DITROPAN) 5 MG tablet Take 5 mg by mouth 2 (two) times daily.   Yes [provider]  pramipexole (MIRAPEX) 1 MG tablet Take 1.5 mg by mouth at bedtime.  06/18/16  Yes [provider]  traZODone (DESYREL) 150 MG tablet Take 150 mg by mouth at bedtime.    Yes [provider]   HYDROcodone-acetaminophen (NORCO/VICODIN) 5-325 MG tablet Take 1 tablet by mouth every 6 (six) hours as needed for moderate pain. 01/23/20   Roxan Hockey, MD  polyethylene glycol (MIRALAX / GLYCOLAX) 17 g packet Take 17 g by mouth daily as needed.    [provider]  sodium phosphate Pediatric (FLEET) 3.5-9.5 GM/59ML enema Place 1 enema rectally once as needed for severe constipation.    [provider]    Allergies    Oxycodone, Aspirin, and Codeine  Review of Systems   Review of Systems  Constitutional: Positive for appetite change, chills and fatigue.  HENT: Negative for ear pain and sore throat.   Eyes: Negative for pain and visual disturbance.  Respiratory: Negative for cough and shortness of breath.   Cardiovascular: Negative for chest pain and palpitations.  Gastrointestinal: Positive for nausea. Negative for abdominal pain and constipation.  Genitourinary: Negative for dysuria and hematuria.  Musculoskeletal: Positive for arthralgias and myalgias.  Skin: Negative for color change and rash.  Neurological: Positive for headaches. Negative for syncope.  All other systems reviewed and are negative.   Physical Exam Updated Vital Signs BP (!) 120/48   Pulse 66   Temp 98 F (36.7 C) (Oral)   Resp 13   SpO2 94%   Physical Exam Vitals and nursing note reviewed.  Constitutional:      General: She is not in acute distress.    Appearance: She is well-developed and well-nourished.     Comments: Slurred speech (at baseline per sister)  HENT:     Head: Normocephalic and atraumatic.  Eyes:     Conjunctiva/sclera: Conjunctivae normal.  Cardiovascular:     Rate and Rhythm: Normal rate and regular rhythm.     Pulses: Normal pulses.  Pulmonary:     Effort: Pulmonary effort is normal. No respiratory distress.     Breath sounds: Normal breath sounds.  Abdominal:     Palpations: Abdomen is soft.     Tenderness: There is no guarding.  Musculoskeletal:         General: No edema.     Cervical back: Neck supple.     Comments: Full ROM of the right and  left hip without significant tenderness No spinal midline ttp No obvious deformity of the lower extremities  Skin:    General: Skin is warm and dry.  Neurological:     Mental Status: She is alert.  Psychiatric:        Mood and Affect: Mood and affect normal.     ED Results / Procedures / Treatments   Labs (all labs ordered are listed, but only abnormal results are displayed) Labs Reviewed  COMPREHENSIVE METABOLIC PANEL - Abnormal; Notable for the following components:      Result Value   Glucose, Bld 122 (*)    Alkaline Phosphatase 146 (*)    All other components within normal limits  URINALYSIS, ROUTINE W REFLEX MICROSCOPIC - Abnormal; Notable for the following components:   APPearance HAZY (*)    All other components within normal limits  RESP PANEL BY RT-PCR (FLU A&B, COVID) ARPGX2  CBC WITH DIFFERENTIAL/PLATELET  TROPONIN I (HIGH SENSITIVITY)    EKG EKG Interpretation  Date/Time:  Thursday May 10 2020 18:25:19 EST Ventricular Rate:  84 PR Interval:    QRS Duration: 101 QT Interval:  363 QTC Calculation: 432 R Axis:   9 Text Interpretation: Sinus rhythm Prolonged PR interval Consider right atrial enlargement Low voltage, precordial leads Nonspecific repolarization abnormalities Artifact in lead(s) I II III aVR aVL aVF V1 V2 V3 V4 V5 V6 No STEMI evident Confirmed by Octaviano Glow [phone]) on 05/10/2020 7:23:23 PM   Radiology DG Chest Portable 1 View  Result Date: 05/10/2020 CLINICAL DATA:  Short of breath EXAM: PORTABLE CHEST 1 VIEW COMPARISON:  09/22/2019 FINDINGS: Surgical hardware in the cervical spine. Stimulator generator over the right chest. No focal opacity or pleural effusion. Normal heart size. No pneumothorax IMPRESSION: No active disease. Electronically Signed   By: Donavan Foil M.D.   On: 05/10/2020 20:01   DG HIP UNILAT WITH PELVIS 2-3 VIEWS  RIGHT  Result Date: 05/10/2020 CLINICAL DATA:  Fall off wheelchair yesterday. Right hip pain. EXAM: DG HIP (WITH OR WITHOUT PELVIS) 2-3V RIGHT COMPARISON:  Pelvis and left hip radiographs 01/03/2020 FINDINGS: The cortical margins of the bony pelvis and right hip are intact. No visualized fracture. Pubic rami are intact. Pubic symphysis and sacroiliac joints are congruent. Surgical hardware in the lower lumbar spine is partially visualized. Mild lateral soft tissue edema. IMPRESSION: No fracture of the pelvis or right hip. Electronically Signed   By: Keith Rake M.D.   On: 05/10/2020 22:40    Procedures Procedures (including critical care time)  Medications Ordered in ED Medications  HYDROcodone-acetaminophen (NORCO/VICODIN) 5-325 MG per tablet 1 tablet (1 tablet Oral Given 05/10/20 2304)    ED Course  I have reviewed the triage vital signs and the nursing notes.  Pertinent labs & imaging results that were available during my care of the patient were reviewed by me and considered in my medical decision making (see chart for details).  This patient complains of headache, myalgia, nausea.  This involves an extensive number of treatment options, and is a complaint that carries with it a high risk of complications and morbidity.  The differential diagnosis includes viral syndrome including covid 19 vs anemia vs UTI vs infection vs other  Xray of the right hip without acute fx.  She has fairly limited pain with ROM testing of the hip.  I have a lower suspicion for fx at this time.  I ordered, reviewed, and interpreted labs, which included trop < 2, WBC 6.2, Hgb 14.8, CMP  largely unremarkable (Alk Phos 146 without RUQ ttp on exam), UA without sign of infection, Covid/flu negative I ordered medication Norco for pain I ordered imaging studies which included dg chest  I independently visualized and interpreted imaging which showed no PNA, no acute process and the monitor tracing which showed  NSR Additional history was obtained from patient's sister at bedside ECG per my interpretation shows NSR with artifact from stimulator device.    No evidence of acute cardiac ischemia or ACS from history, labs, or ECG finding.    After the interventions stated above, I reevaluated the patient and found her vitals were stable.  With her benign workup here, I have a low suspicion for sepsis, meningitis, or acute surgical emergency.  I felt this was likely a viral syndrome and advised they continue conservative symptomatic management for the next week, and follow up with their facility doctor upon return.   Neurology office information provided regarding f/u for her Parkinson's. Okay for discharge.  Final Clinical Impression(s) / ED Diagnoses Final diagnoses:  Viral illness  Right hip pain  Myalgia    Rx / DC Orders ED Discharge Orders         Ordered    Ambulatory referral to Neurology       Comments: An appointment is requested in approximately: 2 weeks Parkinson's disease with DBS   05/10/20 2259           Wyvonnia Dusky, MD 05/11/20 1058

## 2020-05-10 NOTE — ED Notes (Signed)
Family at bedside. 

## 2020-05-10 NOTE — ED Notes (Signed)
Pt given crackers and peanut butter and water per EDP okay

## 2020-05-10 NOTE — ED Notes (Signed)
Pt family brought food in for pt

## 2020-07-23 ENCOUNTER — Inpatient Hospital Stay
Admission: RE | Admit: 2020-07-23 | Discharge: 2020-09-13 | Disposition: A | Discharge status: Nursing Home (Includes ICF) | Payer: Medicare Other | Source: Ambulatory Visit | Attending: Internal Medicine | Admitting: Internal Medicine

## 2020-07-23 ENCOUNTER — Other Ambulatory Visit: Payer: Self-pay | Admitting: Adult Health

## 2020-07-23 MED ORDER — HYDROCODONE-ACETAMINOPHEN 5-325 MG PO TABS
1.0000 | ORAL_TABLET | ORAL | 0 refills | Status: DC
Start: 2020-07-23 — End: 2020-08-24

## 2020-07-23 MED ORDER — ALPRAZOLAM 0.5 MG PO TABS: 0.5000 mg | ORAL_TABLET | Freq: Two times a day (BID) | ORAL | 0 refills | Status: AC | PRN

## 2020-07-24 ENCOUNTER — Encounter: Payer: Self-pay | Admitting: Adult Health

## 2020-07-24 ENCOUNTER — Non-Acute Institutional Stay (SKILLED_NURSING_FACILITY): Payer: Medicare Other | Admitting: Adult Health

## 2020-07-24 DIAGNOSIS — N3281 Overactive bladder: Secondary | ICD-10-CM

## 2020-07-24 DIAGNOSIS — J449 Chronic obstructive pulmonary disease, unspecified: Secondary | ICD-10-CM

## 2020-07-24 DIAGNOSIS — G894 Chronic pain syndrome: Secondary | ICD-10-CM

## 2020-07-24 DIAGNOSIS — Z9689 Presence of other specified functional implants: Secondary | ICD-10-CM

## 2020-07-24 DIAGNOSIS — G20 Parkinson's disease: Secondary | ICD-10-CM

## 2020-07-24 DIAGNOSIS — F339 Major depressive disorder, recurrent, unspecified: Secondary | ICD-10-CM

## 2020-07-24 DIAGNOSIS — I10 Essential (primary) hypertension: Secondary | ICD-10-CM | POA: Diagnosis not present

## 2020-07-24 DIAGNOSIS — K5909 Other constipation: Secondary | ICD-10-CM

## 2020-07-24 DIAGNOSIS — K219 Gastro-esophageal reflux disease without esophagitis: Secondary | ICD-10-CM

## 2020-07-24 DIAGNOSIS — E782 Mixed hyperlipidemia: Secondary | ICD-10-CM

## 2020-07-24 NOTE — Progress Notes (Signed)
Location:  [facility] Room Number: 108-D Place of Service:  SNF (31)   CODE STATUS: Full Code   Allergies  Allergen Reactions  . Oxycodone Other (See Comments)    Can tolerate low dose mixed with tynlelol HALLUCINATIONS   . Aspirin Nausea And Vomiting and Other (See Comments)    Severe stomach pain   . Codeine Nausea Only    Makes her feel very sick     Chief Complaint  Patient presents with  . Follow-up    Transfer follow-up     HPI:  She had been living in assisted living. The assisted living is unable to provide her needs; she is transferring to skilled nursing. She does have a DBS for her parkinson's disease. She does have chronic pain with history of surgeries. She denies any issues with constipation. Her nutritional status is good with an albumin of 3.9. she will continue to be followed for her chronic illnesses including: Essential hypertension:  Parkinson's disease    OAB (overactive bladder):  Past Medical History:  Diagnosis Date  . Anxiety   . Arthritis   . Complication of anesthesia   . Depression   . Depression   . Dysphasia   . GERD (gastroesophageal reflux disease)   . Hyperlipidemia   . Hypertension   . Parkinson's disease ([place])    diagnosed at age 25  . PONV (postoperative nausea and vomiting)    states it has been a long time since getting sick  . Reflux   . Sleep apnea    Has CPAP but doesnt use  . Stroke ([place])   . TIA (transient ischemic attack)    not really TIA but abnormal MRI per pt left sided weakness  . UTI (lower urinary tract infection)   . Vitamin D deficiency     Past Surgical History:  Procedure Laterality Date  . BACK SURGERY    . Battery change     DBS, 12/2009  . BIOPSY N/A 11/29/2013   Procedure: GASTRIC BIOPSY;  Surgeon: Danie Binder, MD;  Location: [facility];  Service: Endoscopy;  Laterality: N/A;  . BURR HOLE W/ STEREOTACTIC INSERTION OF DBS LEADS / INTRAOP MICROELECTRODE RECORDING     2007  .  CARPAL TUNNEL RELEASE Right   . COLONOSCOPY WITH PROPOFOL N/A 11/29/2013   Procedure: COLONOSCOPY WITH PROPOFOL;  Surgeon: Danie Binder, MD;  Location: [facility];  Service: Endoscopy;  Laterality: N/A;  entered cecum @ [phone]; total cecal withdrawal time 21 minutes   . ELBOW SURGERY Left    after fx  . ESOPHAGEAL DILATION N/A 11/29/2013   Procedure: ESOPHAGEAL DILATION;  Surgeon: Danie Binder, MD;  Location: [facility];  Service: Endoscopy;  Laterality: N/A;  [place] 14/15/16  . ESOPHAGOGASTRODUODENOSCOPY (EGD) WITH PROPOFOL N/A 11/29/2013   Procedure: ESOPHAGOGASTRODUODENOSCOPY (EGD) WITH PROPOFOL;  Surgeon: Danie Binder, MD;  Location: [facility];  Service: Endoscopy;  Laterality: N/A;  . FOOT SURGERY Left    "something with 2nd 2."  . NECK SURGERY     [place]  . POLYPECTOMY N/A 11/29/2013   Procedure: POLYPECTOMY;  Surgeon: Danie Binder, MD;  Location: [facility];  Service: Endoscopy;  Laterality: N/A;  Distal Transverse Colon x2   . PR ANALYZE NEUROSTIM BRAIN, FIRST 1H  10/15/2012      . PULSE GENERATOR IMPLANT Right 03/30/2018   Procedure: Right Implantable pulse generator change;  Surgeon: Erline Levine, MD;  Location: [place] [facility];  Service: Neurosurgery;  Laterality: Right;  .  SUBTHALAMIC STIMULATOR BATTERY REPLACEMENT N/A 08/05/2013   Procedure: SUBTHALAMIC STIMULATOR BATTERY REPLACEMENT;  Surgeon: Erline Levine, MD;  Location: [facility];  Service: Neurosurgery;  Laterality: N/A;  . SUBTHALAMIC STIMULATOR BATTERY REPLACEMENT N/A 11/26/2015   Procedure: Implantable pulse generator battery change for deep brain stimulator;  Surgeon: Erline Levine, MD;  Location: [facility];  Service: Neurosurgery;  Laterality: N/A;  . VAGINAL HYSTERECTOMY     "partial"  . WRIST SURGERY Right    after fx    Social History   Socioeconomic History  . Marital status: Widowed    Spouse name: Not on file  . Number of children: Not on file  . Years of education: Not on file  . Highest education level: Not on file   Occupational History  . Occupation: Retired  Tobacco Use  . Smoking status: Former Smoker    Packs/day: 0.50    Years: 25.00    Pack years: 12.50    Types: Cigarettes    Quit date: 03/01/2019    Years since quitting: 1.4  . Smokeless tobacco: Never Used  Vaping Use  . Vaping Use: Never used  Substance and Sexual Activity  . Alcohol use: Not Currently  . Drug use: No  . Sexual activity: Not on file  Other Topics Concern  . Not on file  Social History Narrative   She resides at [facility] (assisted living facility).   Social Determinants of Health   Financial Resource Strain: Not on file  Food Insecurity: Not on file  Transportation Needs: Not on file  Physical Activity: Not on file  Stress: Not on file  Social Connections: Not on file  Intimate Partner Violence: Not on file   Family History  Problem Relation Age of Onset  . Colon cancer Paternal Uncle       VITAL SIGNS BP 123/69   Pulse 86   Temp 98.6 F (37 C)   SpO2 96%   Outpatient Encounter Medications as of 07/24/2020  Medication Sig Note  . acetaminophen (TYLENOL) 500 MG tablet Take 500 mg by mouth every 6 (six) hours as needed for mild pain or moderate pain.   Marland Kitchen albuterol (PROAIR HFA) 108 (90 Base) MCG/ACT inhaler Inhale 2 puffs into the lungs every 4 (four) hours as needed for wheezing or shortness of breath.   . ALPRAZolam (XANAX) 0.5 MG tablet Take 1 tablet (0.5 mg total) by mouth 2 (two) times daily as needed for up to 14 days.   Marland Kitchen amLODipine (NORVASC) 10 MG tablet Take 10 mg by mouth daily.    Marland Kitchen atorvastatin (LIPITOR) 40 MG tablet Take 1 tablet (40 mg total) by mouth daily.   Marland Kitchen BREZTRI AEROSPHERE 160-9-4.8 MCG/ACT AERO Inhale 2 puffs into the lungs 2 (two) times daily.   . Cholecalciferol (VITAMIN D) 50 MCG (2000 UT) tablet Take 2,000 Units by mouth daily.   . clopidogrel (PLAVIX) 75 MG tablet Take 75 mg by mouth daily.   . diclofenac Sodium (VOLTAREN) 1 % GEL Apply 2 g topically 2 (two) times daily.    Marland Kitchen docusate sodium (COLACE) 100 MG capsule Take 100 mg by mouth every other day.   . DULoxetine (CYMBALTA) 60 MG capsule Take 60 mg by mouth 2 (two) times daily.    Marland Kitchen guaiFENesin (MUCINEX) 600 MG 12 hr tablet Take 600 mg by mouth daily as needed.   Marland Kitchen HYDROcodone-acetaminophen (NORCO/VICODIN) 5-325 MG tablet Take 1 tablet by mouth daily after supper.   . losartan (COZAAR) 100 MG  tablet Take 100 mg by mouth daily.   Marland Kitchen omeprazole (PRILOSEC) 20 MG capsule Take 20 mg by mouth in the morning and at bedtime.    Marland Kitchen oxybutynin (DITROPAN) 5 MG tablet Take 5 mg by mouth 2 (two) times daily.   . polyethylene glycol (MIRALAX / GLYCOLAX) 17 g packet Take 17 g by mouth daily as needed.   . pramipexole (MIRAPEX) 1 MG tablet Take 1.5 mg by mouth at bedtime.    . traZODone (DESYREL) 150 MG tablet Take 150 mg by mouth at bedtime.    Marland Kitchen UNABLE TO FIND Med Name: Diet- Mechanical soft   . Carbidopa-Levodopa ER (SINEMET CR) 25-100 MG tablet controlled release Take 1 tablet by mouth every morning.   Marland Kitchen NARCAN 4 MG/0.1ML LIQD nasal spray kit Place 1 spray into the nose once.    No facility-administered encounter medications on file as of 07/24/2020.     SIGNIFICANT DIAGNOSTIC EXAMS  LABS REVIEWED TODAY  05-10-20: wbc 6.2; hgb 14.8; hct 44.7; mcv 94.9 plt 251; glucose 122; bun 14; creat 0.67; k+ 3.5; na++ 140 ca 8.9 GFR>60; alk phos 146; albumin 3.9   Review of Systems  Constitutional: Negative for malaise/fatigue.  HENT:       Sores under tongue due to cracked teeth  Respiratory: Negative for cough and shortness of breath.   Cardiovascular: Negative for chest pain, palpitations and leg swelling.  Gastrointestinal: Negative for abdominal pain, constipation and heartburn.  Musculoskeletal: Positive for back pain. Negative for joint pain and myalgias.       Has chronic pain  Has left side weakness   Skin: Negative.   Neurological: Negative for dizziness.  Psychiatric/Behavioral: The patient is not  nervous/anxious.     Physical Exam Constitutional:      General: She is not in acute distress.    Appearance: She is well-developed, overweight and well-nourished. She is not diaphoretic.  HENT:     Mouth/Throat:     Comments: Teeth are cracked an broken.  Under tongue is rubbing against tongue and causing sores.  Neck:     Thyroid: No thyromegaly.  Cardiovascular:     Rate and Rhythm: Normal rate and regular rhythm.     Pulses: Normal pulses and intact distal pulses.     Heart sounds: Murmur heard.    Pulmonary:     Effort: Pulmonary effort is normal. No respiratory distress.     Breath sounds: Normal breath sounds.  Abdominal:     General: Bowel sounds are normal. There is no distension.     Palpations: Abdomen is soft.     Tenderness: There is no abdominal tenderness.  Musculoskeletal:        General: No edema.     Right lower leg: No edema.     Left lower leg: No edema.     Comments: Has significant left side weakness   Lymphadenopathy:     Cervical: No cervical adenopathy.  Skin:    General: Skin is warm and dry.  Neurological:     Mental Status: She is alert and oriented to person, place, and time.  Psychiatric:        Mood and Affect: Mood and affect and mood normal.      ASSESSMENT/ PLAN:  TODAY  1. Essential hypertension: is stable b/p 123/69: will continue norvasc 10 mg daily cozaar 100 mg daily   2. Parkinson's disease status post deep brain stimulator:  Is without change: will continue sinemet cr 25/100 mg daily will  monitor her status  3. OAB (overactive bladder): is stable: will continue ditropan 5 mg daily   4. Mixed hyperlipidemia: is stable will continue lipitor 40 mg daily   5. Major depression recurrent chronic: is stable will continue cymbalta 60 mg twice daily has xanax 0.5 mg twice daily as needed for 14 days trazadone 150 mg nightly   6. COPD without exacerbation: is stable will continue breztri aerosphere 160-9-4.8 mcg 2 puffs twice  daily has albuterol inhaler 2 puffs every 4 hours as needed; mucinex 600 mg daily as needed  7. GERD without esophagitis: is stable will continue prilosec 20 mg twice daily   8. Chronic constipation: is stable will continue colace 100 mg every other day and miralax daily as needed   9. Chronic pain syndrome: is stable voltaren gel 2 gm twice daily and vicodin 5/325 mg nightly will continue cymbalta 60 mg twice daily also takes for mood state.   10. Mouth sores will begin nystatin 10 cc four times daily for one week.   Will check cbc ;cmp; lipid panel    Time spent with patient 90 minutes >50% spent with patient; coordination of care including therapies.     Ok Edwards NP [facility]  Contact [phone] Monday through Friday 8am- 5pm  After hours call [phone]

## 2020-07-25 ENCOUNTER — Non-Acute Institutional Stay (SKILLED_NURSING_FACILITY): Payer: Medicare Other | Admitting: Internal Medicine

## 2020-07-25 ENCOUNTER — Encounter: Payer: Self-pay | Admitting: Internal Medicine

## 2020-07-25 DIAGNOSIS — G20 Parkinson's disease: Secondary | ICD-10-CM | POA: Diagnosis not present

## 2020-07-25 DIAGNOSIS — G894 Chronic pain syndrome: Secondary | ICD-10-CM | POA: Insufficient documentation

## 2020-07-25 DIAGNOSIS — U071 COVID-19: Secondary | ICD-10-CM | POA: Diagnosis not present

## 2020-07-25 DIAGNOSIS — I10 Essential (primary) hypertension: Secondary | ICD-10-CM | POA: Diagnosis not present

## 2020-07-25 DIAGNOSIS — K5909 Other constipation: Secondary | ICD-10-CM | POA: Insufficient documentation

## 2020-07-25 DIAGNOSIS — E782 Mixed hyperlipidemia: Secondary | ICD-10-CM | POA: Diagnosis not present

## 2020-07-25 DIAGNOSIS — J449 Chronic obstructive pulmonary disease, unspecified: Secondary | ICD-10-CM | POA: Insufficient documentation

## 2020-07-25 DIAGNOSIS — K219 Gastro-esophageal reflux disease without esophagitis: Secondary | ICD-10-CM | POA: Insufficient documentation

## 2020-07-25 NOTE — Progress Notes (Signed)
 NURSING HOME LOCATION:  [facility] ROOM NUMBER:  108-D  CODE STATUS: Full Code   PCP:  Cynthia Butler DO  This is a comprehensive admission note to [facility] performed on this date less than 30 days from date of admission. Included are preadmission medical/surgical history; reconciled medication list; family history; social history and comprehensive review of systems.  Corrections and additions to the records were documented. Comprehensive physical exam was also performed. Additionally a clinical summary was entered for each active diagnosis pertinent to this admission in the Problem List to enhance continuity of care.  HPI:Patient was admitted from an ALF as her high level of needs cannot be met at that facility.  Her medical history is extremely long and complicated.  Diagnoses include history of TIA and history of stroke, history of sleep apnea, GERD, Parkinson's disease, essential hypertension, dyslipidemia, chronic pain syndrome, and chronic depression.  She also had acute COVID-19 infection in December 2020.  Labs were performed 05/10/2020.  Chemistries, electrolytes, and CBC were essentially normal.  There was mild hyperglycemia with a value 122 and mild elevation of the alkaline phosphatase at 146.  There is no current A1c with the last one being prediabetic in November 2020.  Past medical and surgical history:Significant surgeries and procedures include stereotactic insertion of deep brain stimulator, carpal tunnel release, esophageal dilation, colonic polypectomy, and hysterectomy.  Social history:She is a former smoker as well as social drinker.  Family history: Limited history reviewed.  It is noncontributory due to her advanced age.   Review of systems: She states that she has some pain in her tongue related to the poor dental hygiene with sharp remnants of mandibular teeth.  She also complains of pain in the right lateral neck.  Dysphagia is an  occasional issue.  She does describe intermittent right posterior inferior chest discomfort which is nonpleuritic and nonanginal.  She has an occasional productive cough with white phlegm.  Constipation is a significant problem.  She describes easy bruising.  She admits to both anxiety and depression.  Constitutional: No fever, significant weight change  Eyes: No redness, discharge, pain, vision change ENT/mouth: No nasal congestion, purulent discharge, earache, change in hearing, sore throat  Cardiovascular: No palpitations, paroxysmal nocturnal dyspnea, claudication, edema  Respiratory: No hemoptysis, significant snoring, apnea Gastrointestinal: No heartburn, dysphagia, abdominal pain, nausea /vomiting, rectal bleeding, melena Genitourinary: No dysuria, hematuria, pyuria,nocturia Musculoskeletal: No joint stiffness, joint swelling, weakness, pain Dermatologic: No rash, pruritus, change in appearance of skin Neurologic: No dizziness, headache, syncope, seizures, numbness, tingling Psychiatric: No significant insomnia, anorexia Endocrine: No change in hair/skin/nails, excessive thirst, excessive hunger, excessive urination  Hematologic/lymphatic: No significant lymphadenopathy, abnormal bleeding Allergy/immunology: No itchy/watery eyes, significant sneezing, urticaria, angioedema  Physical exam:  Pertinent or positive findings: She sits with her head tilted to the right. She has the facial stigmata of Parkinson's with masked facies.  Speech is hyponasal and slightly slurred.  Pupils are large and poorly responsive to light.  Dental hygiene is extremely poor with multiple missing teeth and dental shards/remnants as described above.  The right angle of the mouth sags slightly.  The heart sounds are slightly accentuated, with the S2 being louder than S1. Sticky rales are noted at the right lower lobe greater than the left lower lobe.  Abdomen is protuberant.  She has 1/2+ edema at the sock line.  Pedal  pulses are decreased.  She is wearing a brace on the right lower extremity.  There is asymmetric   weakness.  The right lower extremity is strongest to opposition despite the brace.  The left lower extremity is the weakest extremity.  She has fair upper extremity strength which is essentially symmetric.  She has scattered bruising over the forearms.  General appearance:no acute distress, increased work of breathing is present.   Lymphatic: No lymphadenopathy about the head, neck, axilla. Eyes: No conjunctival inflammation or lid edema is present. There is no scleral icterus. Ears:  External ear exam shows no significant lesions or deformities.   Nose:  External nasal examination shows no deformity or inflammation. Nasal mucosa are pink and moist without lesions, exudates Neck:  No thyromegaly, masses, tenderness noted.    Heart:  No gallop, murmur, click, rub.  Lungs: without wheezes, rhonchi,  rubs. Abdomen: Bowel sounds are normal.  Abdomen is soft and nontender with no organomegaly, hernias, masses. GU: Deferred  Extremities:  No cyanosis, clubbing. Neurologic exam:  Balance, Rhomberg, finger to nose testing could not be completed due to clinical state Skin: Warm & dry w/o tenting. No significant rash.  See clinical summary under each active problem in the Problem List with associated updated therapeutic plan

## 2020-07-25 NOTE — Assessment & Plan Note (Signed)
BP controlled; no change in antihypertensive medications  

## 2020-07-26 ENCOUNTER — Other Ambulatory Visit: Payer: Self-pay | Admitting: Adult Health

## 2020-07-26 ENCOUNTER — Encounter: Payer: Self-pay | Admitting: Internal Medicine

## 2020-07-26 NOTE — Assessment & Plan Note (Addendum)
Neurology follow up @ least annually or as needed for new or progressive neurologic symptoms PT/OT assessment @ SNF

## 2020-07-26 NOTE — Patient Instructions (Signed)
See assessment and plan under each diagnosis in the problem list and acutely for this visit 

## 2020-07-26 NOTE — Assessment & Plan Note (Signed)
Former smoker but O2 sats good. Occasional productive cough; rales on auscultation . 05/10/2020 CXR : NAD. Repeat CXR as clinically indicated.

## 2020-07-26 NOTE — Assessment & Plan Note (Signed)
August 2021 labs indicate adequate control with LDL in low 70s. Monitor annually.

## 2020-07-27 ENCOUNTER — Other Ambulatory Visit (HOSPITAL_COMMUNITY)
Admission: RE | Admit: 2020-07-27 | Discharge: 2020-07-27 | Disposition: A | Discharge status: Home / Self Care | Payer: Medicare Other | Source: Skilled Nursing Facility | Attending: Adult Health | Admitting: Adult Health

## 2020-07-27 DIAGNOSIS — I10 Essential (primary) hypertension: Secondary | ICD-10-CM | POA: Diagnosis present

## 2020-07-27 DIAGNOSIS — E782 Mixed hyperlipidemia: Secondary | ICD-10-CM | POA: Insufficient documentation

## 2020-07-27 LAB — LIPID PANEL
Cholesterol: 110 mg/dL (ref 0–200)
HDL: 44 mg/dL (ref >40)
LDL Cholesterol: 45 mg/dL (ref 0–99)
Total CHOL/HDL Ratio: 2.5 RATIO
Triglycerides: 107 mg/dL (ref <150)
VLDL: 21 mg/dL (ref 0–40)

## 2020-07-27 LAB — COMPREHENSIVE METABOLIC PANEL
ALT: 14 U/L (ref 0–44)
AST: 17 U/L (ref 15–41)
Albumin: 3.6 g/dL (ref 3.5–5.0)
Alkaline Phosphatase: 108 U/L (ref 38–126)
Anion gap: 9 (ref 5–15)
BUN: 19 mg/dL (ref 8–23)
CO2: 26 mmol/L (ref 22–32)
Calcium: 8.9 mg/dL (ref 8.9–10.3)
Chloride: 104 mmol/L (ref 98–111)
Creatinine, Ser: 0.82 mg/dL (ref 0.44–1.00)
GFR, Estimated: >60 mL/min (ref >60)
Glucose, Bld: 111 mg/dL — ABNORMAL HIGH (ref 70–99)
Potassium: 3.4 mmol/L — ABNORMAL LOW (ref 3.5–5.1)
Sodium: 139 mmol/L (ref 135–145)
Total Bilirubin: 0.8 mg/dL (ref 0.3–1.2)
Total Protein: 6.6 g/dL (ref 6.5–8.1)

## 2020-07-27 LAB — CBC
HCT: 41.9 % (ref 36.0–46.0)
Hemoglobin: 13.8 g/dL (ref 12.0–15.0)
MCH: 31.4 pg (ref 26.0–34.0)
MCHC: 32.9 g/dL (ref 30.0–36.0)
MCV: 95.2 fL (ref 80.0–100.0)
Platelets: 221 10*3/uL (ref 150–400)
RBC: 4.4 MIL/uL (ref 3.87–5.11)
RDW: 12.5 % (ref 11.5–15.5)
WBC: 6.9 10*3/uL (ref 4.0–10.5)
nRBC: 0 % (ref 0.0–0.2)

## 2020-07-30 ENCOUNTER — Other Ambulatory Visit (HOSPITAL_COMMUNITY)
Admission: RE | Admit: 2020-07-30 | Discharge: 2020-07-30 | Disposition: A | Discharge status: Home / Self Care | Payer: Medicare Other | Source: Skilled Nursing Facility | Attending: Adult Health | Admitting: Adult Health

## 2020-07-30 DIAGNOSIS — E876 Hypokalemia: Secondary | ICD-10-CM | POA: Insufficient documentation

## 2020-07-30 LAB — POTASSIUM: Potassium: 3.6 mmol/L (ref 3.5–5.1)

## 2020-07-31 ENCOUNTER — Encounter: Payer: Self-pay | Admitting: Adult Health

## 2020-07-31 DIAGNOSIS — G20 Parkinson's disease: Secondary | ICD-10-CM

## 2020-07-31 DIAGNOSIS — F0280 Dementia in other diseases classified elsewhere without behavioral disturbance: Secondary | ICD-10-CM

## 2020-07-31 DIAGNOSIS — G20A1 Parkinson's disease without dyskinesia, without mention of fluctuations: Secondary | ICD-10-CM | POA: Insufficient documentation

## 2020-07-31 HISTORY — DX: Dementia in other diseases classified elsewhere, unspecified severity, without behavioral disturbance, psychotic disturbance, mood disturbance, and anxiety: F02.80

## 2020-07-31 HISTORY — DX: Parkinson's disease: G20

## 2020-08-07 ENCOUNTER — Other Ambulatory Visit (HOSPITAL_COMMUNITY): Payer: Self-pay | Admitting: Specialist

## 2020-08-07 DIAGNOSIS — I69391 Dysphagia following cerebral infarction: Secondary | ICD-10-CM

## 2020-08-07 DIAGNOSIS — R198 Other specified symptoms and signs involving the digestive system and abdomen: Secondary | ICD-10-CM

## 2020-08-07 DIAGNOSIS — R0989 Other specified symptoms and signs involving the circulatory and respiratory systems: Secondary | ICD-10-CM

## 2020-08-08 ENCOUNTER — Encounter: Payer: Self-pay | Admitting: Adult Health

## 2020-08-08 ENCOUNTER — Non-Acute Institutional Stay (SKILLED_NURSING_FACILITY): Payer: Medicare Other | Admitting: Adult Health

## 2020-08-08 DIAGNOSIS — G20 Parkinson's disease: Secondary | ICD-10-CM

## 2020-08-08 DIAGNOSIS — J449 Chronic obstructive pulmonary disease, unspecified: Secondary | ICD-10-CM | POA: Diagnosis not present

## 2020-08-08 DIAGNOSIS — F0280 Dementia in other diseases classified elsewhere without behavioral disturbance: Secondary | ICD-10-CM

## 2020-08-08 NOTE — Progress Notes (Signed)
Location:  [facility] Room Number: 662 Place of Service:  SNF (31)   CODE STATUS: full code   Allergies  Allergen Reactions  . Oxycodone Other (See Comments)    Can tolerate low dose mixed with tynlelol HALLUCINATIONS   . Docosahexaenoic Acid   . Eicosapentaenoic Acid (Epa)   . Lutein   . Omega-3 Fatty Acids   . Other   . Aspirin Nausea And Vomiting and Other (See Comments)    Severe stomach pain   . Codeine Nausea Only    Makes her feel very sick     Chief Complaint  Patient presents with  . Acute Visit    Care plan meeting     HPI:  We have come together for her care plan meeting. BIMS 11/15 mood 1/30.    She is extensive to dependent for her adls. She does feed herself. She is occasional incontinent of bladder is continent of bowel. There have been no falls. Weight good appetite weight is 182.6 pounds    Therapy stand min assist bed mobility min assist ambulate 20 fee with min assist; is on dysphagia 3 with ground meats; modified ordered; is on ST for projection of voice.  there are no reports of pain present. She continues to be followed for her chronic illnesses including:  COPD without exacerbation Parkinson's disease Dementia due to parkinson's disease without behavioral disturbance  Past Medical History:  Diagnosis Date  . Anxiety   . Arthritis   . Complication of anesthesia   . Depression   . Dysphasia   . GERD (gastroesophageal reflux disease)   . Hyperlipidemia   . Hypertension   . Parkinson's disease ([place])    diagnosed at age 58  . Parkinson's disease dementia ([place]) 07/31/2020  . PONV (postoperative nausea and vomiting)    states it has been a long time since getting sick  . Sleep apnea    Has CPAP but doesnt use  . Stroke ([place])   . TIA (transient ischemic attack)    not really TIA but abnormal MRI per pt left sided weakness  . UTI (lower urinary tract infection)   . Vitamin D deficiency     Past Surgical History:   Procedure Laterality Date  . BACK SURGERY    . Battery change     DBS, 12/2009  . BIOPSY N/A 11/29/2013   Procedure: GASTRIC BIOPSY;  Surgeon: Danie Binder, MD;  Location: [facility];  Service: Endoscopy;  Laterality: N/A;  . BURR HOLE W/ STEREOTACTIC INSERTION OF DBS LEADS / INTRAOP MICROELECTRODE RECORDING     2007  . CARPAL TUNNEL RELEASE Right   . COLONOSCOPY WITH PROPOFOL N/A 11/29/2013   Procedure: COLONOSCOPY WITH PROPOFOL;  Surgeon: Danie Binder, MD;  Location: [facility];  Service: Endoscopy;  Laterality: N/A;  entered cecum @ [phone]; total cecal withdrawal time 21 minutes   . ELBOW SURGERY Left    after fx  . ESOPHAGEAL DILATION N/A 11/29/2013   Procedure: ESOPHAGEAL DILATION;  Surgeon: Danie Binder, MD;  Location: [facility];  Service: Endoscopy;  Laterality: N/A;  [facility] 14/15/16  . ESOPHAGOGASTRODUODENOSCOPY (EGD) WITH PROPOFOL N/A 11/29/2013   Procedure: ESOPHAGOGASTRODUODENOSCOPY (EGD) WITH PROPOFOL;  Surgeon: Danie Binder, MD;  Location: [facility];  Service: Endoscopy;  Laterality: N/A;  . FOOT SURGERY Left    "something with 2nd 2."  . NECK SURGERY     Fusion  . POLYPECTOMY N/A 11/29/2013   Procedure: POLYPECTOMY;  Surgeon:  Danie Binder, MD;  Location: [facility];  Service: Endoscopy;  Laterality: N/A;  Distal Transverse Colon x2   . PR ANALYZE NEUROSTIM BRAIN, FIRST 1H  10/15/2012      . PULSE GENERATOR IMPLANT Right 03/30/2018   Procedure: Right Implantable pulse generator change;  Surgeon: Erline Levine, MD;  Location: [facility];  Service: Neurosurgery;  Laterality: Right;  . SUBTHALAMIC STIMULATOR BATTERY REPLACEMENT N/A 08/05/2013   Procedure: SUBTHALAMIC STIMULATOR BATTERY REPLACEMENT;  Surgeon: Erline Levine, MD;  Location: [facility];  Service: Neurosurgery;  Laterality: N/A;  . SUBTHALAMIC STIMULATOR BATTERY REPLACEMENT N/A 11/26/2015   Procedure: Implantable pulse generator battery change for deep brain stimulator;  Surgeon: Erline Levine, MD;  Location: [facility];  Service:  Neurosurgery;  Laterality: N/A;  . VAGINAL HYSTERECTOMY     "partial"  . WRIST SURGERY Right    after fx    Social History   Socioeconomic History  . Marital status: Widowed    Spouse name: Not on file  . Number of children: Not on file  . Years of education: Not on file  . Highest education level: Not on file  Occupational History  . Occupation: Retired  Tobacco Use  . Smoking status: Former Smoker    Packs/day: 0.50    Years: 25.00    Pack years: 12.50    Types: Cigarettes    Quit date: 03/01/2019    Years since quitting: 1.4  . Smokeless tobacco: Never Used  Vaping Use  . Vaping Use: Never used  Substance and Sexual Activity  . Alcohol use: Not Currently  . Drug use: No  . Sexual activity: Not on file  Other Topics Concern  . Not on file  Social History Narrative   She resides at [facility] (assisted living facility).   Social Determinants of Health   Financial Resource Strain: Not on file  Food Insecurity: Not on file  Transportation Needs: Not on file  Physical Activity: Not on file  Stress: Not on file  Social Connections: Not on file  Intimate Partner Violence: Not on file   Family History  Problem Relation Age of Onset  . Colon cancer Paternal Uncle       VITAL SIGNS BP (!) 141/64   Pulse 73   Temp (!) 97.5 F (36.4 C)   Resp 18   Ht 5' 2" (1.575 m)   Wt 182 lb 9.6 oz (82.8 kg)   SpO2 100%   BMI 33.40 kg/m   Outpatient Encounter Medications as of 08/08/2020  Medication Sig  . acetaminophen (TYLENOL) 500 MG tablet Take 500 mg by mouth every 6 (six) hours as needed for mild pain or moderate pain.  Marland Kitchen albuterol (VENTOLIN HFA) 108 (90 Base) MCG/ACT inhaler Inhale 2 puffs into the lungs every 4 (four) hours as needed for wheezing or shortness of breath.  Marland Kitchen amLODipine (NORVASC) 10 MG tablet Take 10 mg by mouth daily.   Marland Kitchen atorvastatin (LIPITOR) 40 MG tablet Take 1 tablet (40 mg total) by mouth daily.  Marland Kitchen BREZTRI AEROSPHERE 160-9-4.8 MCG/ACT AERO  Inhale 2 puffs into the lungs 2 (two) times daily.  . carbidopa-levodopa (SINEMET IR) 25-100 MG tablet Take 1 tablet by mouth daily in the afternoon.  . Cholecalciferol (VITAMIN D) 50 MCG (2000 UT) tablet Take 2,000 Units by mouth daily.  . clopidogrel (PLAVIX) 75 MG tablet Take 75 mg by mouth daily.  . diclofenac Sodium (VOLTAREN) 1 % GEL Apply 2 g topically 2 (two) times daily.  Marland Kitchen  docusate sodium (COLACE) 100 MG capsule Take 100 mg by mouth every other day.  . DULoxetine (CYMBALTA) 60 MG capsule Take 60 mg by mouth 2 (two) times daily.   Marland Kitchen guaiFENesin (MUCINEX) 600 MG 12 hr tablet Take 600 mg by mouth daily as needed.  Marland Kitchen HYDROcodone-acetaminophen (NORCO/VICODIN) 5-325 MG tablet Take 1 tablet by mouth daily after supper.  . losartan (COZAAR) 100 MG tablet Take 100 mg by mouth daily.  Marland Kitchen omeprazole (PRILOSEC) 20 MG capsule Take 20 mg by mouth in the morning and at bedtime.   Marland Kitchen oxybutynin (DITROPAN) 5 MG tablet Take 5 mg by mouth 2 (two) times daily.  . polyethylene glycol (MIRALAX / GLYCOLAX) 17 g packet Take 17 g by mouth daily as needed.  . pramipexole (MIRAPEX) 1 MG tablet Take 1.5 mg by mouth at bedtime.   . traZODone (DESYREL) 150 MG tablet Take 150 mg by mouth at bedtime.   Marland Kitchen UNABLE TO FIND Med Name: Diet- Mechanical soft   No facility-administered encounter medications on file as of 08/08/2020.     SIGNIFICANT DIAGNOSTIC EXAMS   LABS REVIEWED PREVIOUS  05-10-20: wbc 6.2; hgb 14.8; hct 44.7; mcv 94.9 plt 251; glucose 122; bun 14; creat 0.67; k+ 3.5; na++ 140 ca 8.9 GFR>60; alk phos 146; albumin 3.9   NO NEW LABS.   Review of Systems  Constitutional: Negative for malaise/fatigue.  Respiratory: Negative for cough and shortness of breath.   Cardiovascular: Negative for chest pain, palpitations and leg swelling.  Gastrointestinal: Negative for abdominal pain, constipation and heartburn.  Musculoskeletal: Positive for back pain. Negative for joint pain and myalgias.       Chronic  pain a  Skin: Negative.   Neurological: Negative for dizziness.  Psychiatric/Behavioral: The patient is not nervous/anxious.     Physical Exam Constitutional:      General: She is not in acute distress.    Appearance: She is well-developed. She is not diaphoretic.  Neck:     Thyroid: No thyromegaly.  Cardiovascular:     Rate and Rhythm: Normal rate and regular rhythm.     Pulses: Normal pulses.     Heart sounds: Murmur heard.    Pulmonary:     Effort: Pulmonary effort is normal. No respiratory distress.     Breath sounds: Normal breath sounds.  Abdominal:     General: Bowel sounds are normal. There is no distension.     Palpations: Abdomen is soft.     Tenderness: There is no abdominal tenderness.  Musculoskeletal:     Cervical back: Neck supple.     Right lower leg: No edema.     Left lower leg: No edema.     Comments: Has left side weakness   Lymphadenopathy:     Cervical: No cervical adenopathy.  Skin:    General: Skin is warm and dry.  Neurological:     Mental Status: She is alert. Mental status is at baseline.  Psychiatric:        Mood and Affect: Mood normal.       ASSESSMENT/ PLAN:  TODAY  1, COPD without exacerbation 2. Parkinson's disease 3. Dementia due to parkinson's disease without behavioral disturbance  Will continue current medications Will continue current plan of care Will continue to monitor her status.   Time spent with patient 35 minutes >50% spent with coordination and counseling regarding: goals of therapy and care; in therapy and in nursing and medications.    Ok Edwards NP [facility]  Contact [phone] Monday through Friday 8am- 5pm  After hours call [phone]

## 2020-08-13 ENCOUNTER — Ambulatory Visit (HOSPITAL_COMMUNITY): Payer: Medicare Other | Admitting: Speech Pathology

## 2020-08-14 ENCOUNTER — Other Ambulatory Visit (HOSPITAL_COMMUNITY): Payer: Medicare Other

## 2020-08-14 ENCOUNTER — Encounter (HOSPITAL_COMMUNITY): Payer: Medicare Other | Admitting: Speech Pathology

## 2020-08-20 ENCOUNTER — Other Ambulatory Visit: Payer: Self-pay | Admitting: Adult Health

## 2020-08-20 MED ORDER — ALPRAZOLAM 0.5 MG PO TABS: 0.5000 mg | ORAL_TABLET | Freq: Two times a day (BID) | ORAL | 0 refills | Status: DC | PRN

## 2020-08-22 ENCOUNTER — Encounter (HOSPITAL_COMMUNITY): Payer: Self-pay | Admitting: Speech Pathology

## 2020-08-22 ENCOUNTER — Ambulatory Visit (HOSPITAL_COMMUNITY): Payer: No Typology Code available for payment source | Attending: Adult Health | Admitting: Speech Pathology

## 2020-08-22 ENCOUNTER — Inpatient Hospital Stay (HOSPITAL_COMMUNITY)
Admit: 2020-08-22 | Discharge: 2020-08-22 | Disposition: A | Discharge status: Home / Self Care | Payer: Medicare Other | Attending: Adult Health | Admitting: Adult Health

## 2020-08-22 DIAGNOSIS — R1313 Dysphagia, pharyngeal phase: Secondary | ICD-10-CM | POA: Insufficient documentation

## 2020-08-22 DIAGNOSIS — R198 Other specified symptoms and signs involving the digestive system and abdomen: Secondary | ICD-10-CM

## 2020-08-22 DIAGNOSIS — R0989 Other specified symptoms and signs involving the circulatory and respiratory systems: Secondary | ICD-10-CM

## 2020-08-22 DIAGNOSIS — I69391 Dysphagia following cerebral infarction: Secondary | ICD-10-CM

## 2020-08-22 NOTE — Therapy (Signed)
[place], [place], [address] Phone: [phone]   Fax:  [phone]  Modified Barium Swallow  Patient Details  Name: Courtney Tucker MRN: [id number] Date of Birth: 01-Aug-1945 No data recorded  Encounter Date: 08/22/2020   End of Session - 08/22/20 1546    Visit Number 1    Number of Visits 1    Activity Tolerance Patient tolerated treatment well          Today's Date: 08/22/2020 Time: No data recorded-No data recorded No data recorded  Past Medical History:  Past Medical History:  Diagnosis Date  . Anxiety   . Arthritis   . Complication of anesthesia   . Depression   . Dysphasia   . GERD (gastroesophageal reflux disease)   . Hyperlipidemia   . Hypertension   . Parkinson's disease ([place])    diagnosed at age 75  . Parkinson's disease dementia ([place]) 07/31/2020  . PONV (postoperative nausea and vomiting)    states it has been a long time since getting sick  . Sleep apnea    Has CPAP but doesnt use  . Stroke ([place])   . TIA (transient ischemic attack)    not really TIA but abnormal MRI per pt left sided weakness  . UTI (lower urinary tract infection)   . Vitamin D deficiency    Past Surgical History:  Past Surgical History:  Procedure Laterality Date  . BACK SURGERY    . Battery change     DBS, 12/2009  . BIOPSY N/A 11/29/2013   Procedure: GASTRIC BIOPSY;  Surgeon: Danie Binder, MD;  [facility];  Service: Endoscopy;  Laterality: N/A;  . BURR HOLE W/ STEREOTACTIC INSERTION OF DBS LEADS / INTRAOP MICROELECTRODE RECORDING     2007  . CARPAL TUNNEL RELEASE Right   . COLONOSCOPY WITH PROPOFOL N/A 11/29/2013   Procedure: COLONOSCOPY WITH PROPOFOL;  Surgeon: Danie Binder, MD;  Location: [facility];  Service: Endoscopy;  Laterality: N/A;  entered cecum @ [phone]; total cecal withdrawal time 21 minutes   . ELBOW SURGERY Left    after fx  . ESOPHAGEAL DILATION N/A 11/29/2013   Procedure: ESOPHAGEAL  DILATION;  Surgeon: Danie Binder, MD;  Location: [facility];  Service: Endoscopy;  Laterality: N/A;  [facility] 14/15/16  . ESOPHAGOGASTRODUODENOSCOPY (EGD) WITH PROPOFOL N/A 11/29/2013   Procedure: ESOPHAGOGASTRODUODENOSCOPY (EGD) WITH PROPOFOL;  Surgeon: Danie Binder, MD;  Location[facility];  Service: Endoscopy;  Laterality: N/A;  . FOOT SURGERY Left    "something with 2nd 2."  . NECK SURGERY     Fusion  . POLYPECTOMY N/A 11/29/2013   Procedure: POLYPECTOMY;  Surgeon: Danie Binder, MD;  Location: [facility];  Service: Endoscopy;  Laterality: N/A;  Distal Transverse Colon x2   . PR ANALYZE NEUROSTIM BRAIN, FIRST 1H  10/15/2012      . PULSE GENERATOR IMPLANT Right 03/30/2018   Procedure: Right Implantable pulse generator change;  Surgeon: Erline Levine, MD;  Location: [facility];  Service: Neurosurgery;  Laterality: Right;  . SUBTHALAMIC STIMULATOR BATTERY REPLACEMENT N/A 08/05/2013   Procedure: SUBTHALAMIC STIMULATOR BATTERY REPLACEMENT;  Surgeon: Erline Levine, MD;  Location: [facility];  Service: Neurosurgery;  Laterality: N/A;  . SUBTHALAMIC STIMULATOR BATTERY REPLACEMENT N/A 11/26/2015   Procedure: Implantable pulse generator battery change for deep brain stimulator;  Surgeon: Erline Levine, MD;  Location: [facility];  Service: Neurosurgery;  Laterality: N/A;  . VAGINAL HYSTERECTOMY     "  partial"  . WRIST SURGERY Right    after fx   HPI: Patient was admitted from an ALF to [facility]- SNF where she currently resides.  Her medical history is extremely long and complicated.  Diagnoses include history of TIA and history of stroke, history of sleep apnea, GERD, Parkinson's disease, essential hypertension, dyslipidemia, chronic pain syndrome, and chronic depression.  She also had acute COVID-19 infection in December 2020. She is currently on D3 with ground/D2 meats and is on thin liquids. MBSS is ordered secondary to coughing with thin liquids.   No data recorded   Assessment / Plan / Recommendation  CHL IP  CLINICAL IMPRESSIONS 08/22/2020  Clinical Impression Pt presents with mild/moderate sensorimotor pharyngeal dsyphagia characterized by penetration and aspiration of thin liquids resulting from decreased laryngeal vestibule closure and decreased pharyngeal squeeze. Unfortunately aspiration is sensed based on volume; trace and minimal amounts of aspiration are frequently not sensed. The swallow is timely, however, trace to minimal amounts of thin liquids fall to the cords during the swallow and a trace amount remains on the cords, with cues Pt produced a throat clear followed by repeat swallow which cleared residue from laryngeal vestibule.  Chin tuck is effective in minimizing penetration/residue/aspiration, however, a trace amount still falls to the cords and is not reflexively cleared.  Moderate aspiration (only visualized once with straw sips of thin) was sensed as evidenced by reflexive minimally effective cough. Majority of penetration did not result in aspiration, however, she required cue every time to clear residue sitting on cords; suspect residue would have eventually been aspirated. No penetration or aspiration was visualized with NTL. Puree and regular consumption was unremarkable.  Least restrictive diet recommendation D3 and NECTAR thick liquids. Recommend frazier free water protocol *water only, in between meals after oral care*. Further note, Pt loves Dr. Malachi Bonds and was instructed that it can be thickened to nectar consistency. Recommend initiate pharyngeal strengthening exercises with dysphagia therapy and repeat MBSS in 2-3 months for potential upgrade.  SLP Visit Diagnosis Dysphagia, pharyngeal phase (R13.13)  Attention and concentration deficit following --  Frontal lobe and executive function deficit following --  Impact on safety and function Mild aspiration risk;Moderate aspiration risk      CHL IP TREATMENT RECOMMENDATION 01/22/2020  Treatment Recommendations Therapy as outlined in  treatment plan below     Prognosis 08/22/2020  Prognosis for Safe Diet Advancement Good  Barriers to Reach Goals --  Barriers/Prognosis Comment --    CHL IP DIET RECOMMENDATION 08/22/2020  SLP Diet Recommendations Dysphagia 3 (Mech soft) solids;Nectar thick liquid  Liquid Administration via Cup;Straw  Medication Administration Whole meds with liquid  Compensations Minimize environmental distractions;Slow rate;Small sips/bites  Postural Changes Remain semi-upright after after feeds/meals (Comment)      CHL IP OTHER RECOMMENDATIONS 08/22/2020  Recommended Consults --  Oral Care Recommendations Oral care BID  Other Recommendations --      CHL IP FOLLOW UP RECOMMENDATIONS 01/22/2020  Follow up Recommendations Skilled Nursing facility      [facility] IP FREQUENCY AND DURATION 01/22/2020  Speech Therapy Frequency (ACUTE ONLY) min 1 x/week  Treatment Duration 1 week           CHL IP ORAL PHASE 08/22/2020  Oral Phase WFL  Oral - Pudding Teaspoon --  Oral - Pudding Cup --  Oral - Honey Teaspoon --  Oral - Honey Cup --  Oral - Nectar Teaspoon --  Oral - Nectar Cup --  Oral - Nectar Straw --  Oral -  Thin Teaspoon --  Oral - Thin Cup --  Oral - Thin Straw --  Oral - Puree --  Oral - Mech Soft --  Oral - Regular --  Oral - Multi-Consistency --  Oral - Pill --  Oral Phase - Comment --    CHL IP PHARYNGEAL PHASE 08/22/2020  Pharyngeal Phase Impaired  Pharyngeal- Pudding Teaspoon --  Pharyngeal --  Pharyngeal- Pudding Cup --  Pharyngeal --  Pharyngeal- Honey Teaspoon --  Pharyngeal --  Pharyngeal- Honey Cup --  Pharyngeal --  Pharyngeal- Nectar Teaspoon NT  Pharyngeal --  Pharyngeal- Nectar Cup WFL  Pharyngeal --  Pharyngeal- Nectar Straw NT  Pharyngeal --  Pharyngeal- Thin Teaspoon Penetration/Aspiration during swallow;Penetration/Apiration after swallow;Trace aspiration  Pharyngeal Material enters airway, CONTACTS cords and then ejected out  Pharyngeal- Thin Cup  Penetration/Aspiration during swallow;Trace aspiration  Pharyngeal Material enters airway, CONTACTS cords and not ejected out;Material enters airway, CONTACTS cords and then ejected out;Material enters airway, passes BELOW cords without attempt by patient to eject out (silent aspiration)  Pharyngeal- Thin Straw Penetration/Aspiration during swallow;Penetration/Apiration after swallow;Moderate aspiration  Pharyngeal Material enters airway, CONTACTS cords and then ejected out;Material enters airway, CONTACTS cords and not ejected out;Material enters airway, passes BELOW cords without attempt by patient to eject out (silent aspiration)  Pharyngeal- Puree WFL  Pharyngeal --  Pharyngeal- Mechanical Soft NT  Pharyngeal --  Pharyngeal- Regular WFL  Pharyngeal --  Pharyngeal- Multi-consistency --  Pharyngeal --  Pharyngeal- Pill --  Pharyngeal --  Pharyngeal Comment --     CHL IP CERVICAL ESOPHAGEAL PHASE 08/22/2020  Cervical Esophageal Phase WFL  Pudding Teaspoon --  Pudding Cup --  Honey Teaspoon --  Honey Cup --  Nectar Teaspoon --  Nectar Cup --  Nectar Straw --  Thin Teaspoon --  Thin Cup --  Thin Straw --  Puree --  Mechanical Soft --  Regular --  Multi-consistency --  Pill --  Cervical Esophageal Comment --    Lenzie Sandler H. Roddie Mc, CCC-SLP Speech Language Pathologist  Wende Bushy 08/22/2020, 3:48 PM

## 2020-08-24 ENCOUNTER — Non-Acute Institutional Stay (SKILLED_NURSING_FACILITY): Payer: Medicare Other | Admitting: Adult Health

## 2020-08-24 ENCOUNTER — Encounter: Payer: Self-pay | Admitting: Adult Health

## 2020-08-24 DIAGNOSIS — N3281 Overactive bladder: Secondary | ICD-10-CM

## 2020-08-24 DIAGNOSIS — I10 Essential (primary) hypertension: Secondary | ICD-10-CM

## 2020-08-24 DIAGNOSIS — G20 Parkinson's disease: Secondary | ICD-10-CM

## 2020-08-24 NOTE — Progress Notes (Signed)
Location:  [facility] Room Number: 108/D Place of Service:  SNF (31)   CODE STATUS: Full Code  Allergies  Allergen Reactions  . Oxycodone Other (See Comments)    Can tolerate low dose mixed with tynlelol HALLUCINATIONS   . Docosahexaenoic Acid   . Eicosapentaenoic Acid (Epa)   . Lutein   . Omega-3 Fatty Acids   . Other   . Aspirin Nausea And Vomiting and Other (See Comments)    Severe stomach pain   . Codeine Nausea Only    Makes her feel very sick     Chief Complaint  Patient presents with  . Medical Management of Chronic Issues           Essential hypertension:     Parkinson's disease OAB (overactive bladder)     HPI:  She is a 75 year old long term resident of this facility being seen for the management of her chronic illnesses:  Essential hypertension:     Parkinson's disease OAB (overactive bladder).  She continues to get out of bed daily. There are no reports of uncontrolled pain; no reports of changes in appetite.   Past Medical History:  Diagnosis Date  . Anxiety   . Arthritis   . Complication of anesthesia   . Depression   . Dysphasia   . GERD (gastroesophageal reflux disease)   . Hyperlipidemia   . Hypertension   . Parkinson's disease ([place])    diagnosed at age 71  . 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 PONV (postoperative nausea and vomiting)    states it has been a long time since getting sick  . Sleep apnea    Has CPAP but doesnt use  . Stroke ([place])   . TIA (transient ischemic attack)    not really TIA but abnormal MRI per pt left sided weakness  . UTI (lower urinary tract infection)   . Vitamin D deficiency     Past Surgical History:  Procedure Laterality Date  . BACK SURGERY    . Battery change     DBS, 12/2009  . BIOPSY N/A 11/29/2013   Procedure: GASTRIC BIOPSY;  Surgeon: Danie Binder, MD;  Location: [facility];  Service: Endoscopy;  Laterality: N/A;  . BURR HOLE W/ STEREOTACTIC INSERTION OF DBS LEADS /  INTRAOP MICROELECTRODE RECORDING     2007  . CARPAL TUNNEL RELEASE Right   . COLONOSCOPY WITH PROPOFOL N/A 11/29/2013   Procedure: COLONOSCOPY WITH PROPOFOL;  Surgeon: Danie Binder, MD;  [facility]: [facility];  Service: Endoscopy;  Laterality: N/A;  entered cecum @ [phone]; total cecal withdrawal time 21 minutes   . ELBOW SURGERY Left    after fx  . ESOPHAGEAL DILATION N/A 11/29/2013   Procedure: ESOPHAGEAL DILATION;  Surgeon: Danie Binder, MD;  [facility];  Service: Endoscopy;  Laterality: N/A;  [facility] 14/15/16  . ESOPHAGOGASTRODUODENOSCOPY (EGD) WITH PROPOFOL N/A 11/29/2013   Procedure: ESOPHAGOGASTRODUODENOSCOPY (EGD) WITH PROPOFOL;  Surgeon: Danie Binder, MD;  [facility];  Service: Endoscopy;  Laterality: N/A;  . FOOT SURGERY Left    "something with 2nd 2."  . NECK SURGERY     Fusion  . POLYPECTOMY N/A 11/29/2013   Procedure: POLYPECTOMY;  Surgeon: Danie Binder, MD;  [facility]: [facility];  Service: Endoscopy;  Laterality: N/A;  Distal Transverse Colon x2   . PR ANALYZE NEUROSTIM BRAIN, FIRST 1H  10/15/2012      . PULSE GENERATOR IMPLANT Right 03/30/2018  Procedure: Right Implantable pulse generator change;  Surgeon: Erline Levine, MD;  Location: [facility];  Service: Neurosurgery;  Laterality: Right;  . SUBTHALAMIC STIMULATOR BATTERY REPLACEMENT N/A 08/05/2013   Procedure: SUBTHALAMIC STIMULATOR BATTERY REPLACEMENT;  Surgeon: Erline Levine, MD;  Location: [facility];  Service: Neurosurgery;  Laterality: N/A;  . SUBTHALAMIC STIMULATOR BATTERY REPLACEMENT N/A 11/26/2015   Procedure: Implantable pulse generator battery change for deep brain stimulator;  Surgeon: Erline Levine, MD;  Location: [facility];  Service: Neurosurgery;  Laterality: N/A;  . VAGINAL HYSTERECTOMY     "partial"  . WRIST SURGERY Right    after fx    Social History   Socioeconomic History  . Marital status: Widowed    Spouse name: Not on file  . Number of children: Not on file  . Years of education: Not on file  .  Highest education level: Not on file  Occupational History  . Occupation: Retired  Tobacco Use  . Smoking status: Former Smoker    Packs/day: 0.50    Years: 25.00    Pack years: 12.50    Types: Cigarettes    Quit date: 03/01/2019    Years since quitting: 1.4  . Smokeless tobacco: Never Used  Vaping Use  . Vaping Use: Never used  Substance and Sexual Activity  . Alcohol use: Not Currently  . Drug use: No  . Sexual activity: Not on file  Other Topics Concern  . Not on file  Social History Narrative   She resides at [facility] (assisted living facility).   Social Determinants of Health   Financial Resource Strain: Not on file  Food Insecurity: Not on file  Transportation Needs: Not on file  Physical Activity: Not on file  Stress: Not on file  Social Connections: Not on file  Intimate Partner Violence: Not on file   Family History  Problem Relation Age of Onset  . Colon cancer Paternal Uncle       VITAL SIGNS BP 114/69   Pulse 77   Temp (!) 96.3 F (35.7 C)   Resp 18   Ht 5' 2"  (1.575 m)   Wt 182 lb 9.6 oz (82.8 kg)   SpO2 94%   BMI 33.40 kg/m   Outpatient Encounter Medications as of 08/24/2020  Medication Sig  . acetaminophen (TYLENOL) 500 MG tablet Take 500 mg by mouth every 6 (six) hours as needed for mild pain or moderate pain.  Marland Kitchen albuterol (VENTOLIN HFA) 108 (90 Base) MCG/ACT inhaler Inhale 2 puffs into the lungs every 4 (four) hours as needed for wheezing or shortness of breath.  . ALPRAZolam (XANAX) 0.5 MG tablet Take 1 tablet (0.5 mg total) by mouth 2 (two) times daily as needed for up to 14 days for anxiety.  Marland Kitchen alum & mag hydroxide-simeth (MAALOX PLUS) 400-400-40 MG/5ML suspension Take 20 mLs by mouth 2 (two) times daily as needed for indigestion.  Marland Kitchen amLODipine (NORVASC) 10 MG tablet Take 10 mg by mouth daily.   Marland Kitchen atorvastatin (LIPITOR) 40 MG tablet Take 1 tablet (40 mg total) by mouth daily.  Marland Kitchen BREZTRI AEROSPHERE 160-9-4.8 MCG/ACT AERO Inhale 2 puffs  into the lungs 2 (two) times daily.  . carbidopa-levodopa (SINEMET IR) 25-100 MG tablet Take 1 tablet by mouth daily in the afternoon.  . Cholecalciferol (VITAMIN D) 50 MCG (2000 UT) tablet Take 2,000 Units by mouth daily.  . clopidogrel (PLAVIX) 75 MG tablet Take 75 mg by mouth daily.  Marland Kitchen docusate sodium (COLACE) 100 MG capsule Take  100 mg by mouth every other day.  . DULoxetine (CYMBALTA) 60 MG capsule Take 60 mg by mouth daily.  Marland Kitchen guaiFENesin (MUCINEX) 600 MG 12 hr tablet Take 600 mg by mouth daily as needed.  Marland Kitchen losartan (COZAAR) 100 MG tablet Take 100 mg by mouth daily.  Marland Kitchen omeprazole (PRILOSEC) 20 MG capsule Take 20 mg by mouth in the morning and at bedtime.   Marland Kitchen oxybutynin (DITROPAN) 5 MG tablet Take 5 mg by mouth 2 (two) times daily.  . polyethylene glycol (MIRALAX / GLYCOLAX) 17 g packet Take 17 g by mouth daily as needed.  . pramipexole (MIRAPEX) 1 MG tablet Take 1.5 mg by mouth at bedtime.   . traZODone (DESYREL) 150 MG tablet Take 150 mg by mouth at bedtime.   Marland Kitchen UNABLE TO FIND Diet Change: Dys 3 (chopped) diet, continue thin liquids; will allow pizza if requested  Diet - Liquids: _x __Regular; ___Thickened ___ Consistency: ___ Nectar, ___Honey, ___ Pudding; ____ Fluid Restriction  . [DISCONTINUED] diclofenac Sodium (VOLTAREN) 1 % GEL Apply 2 g topically 2 (two) times daily.  . [DISCONTINUED] HYDROcodone-acetaminophen (NORCO/VICODIN) 5-325 MG tablet Take 1 tablet by mouth daily after supper.   No facility-administered encounter medications on file as of 08/24/2020.     SIGNIFICANT DIAGNOSTIC EXAMS   LABS REVIEWED PREVIOUS  05-10-20: wbc 6.2; hgb 14.8; hct 44.7; mcv 94.9 plt 251; glucose 122; bun 14; creat 0.67; k+ 3.5; na++ 140 ca 8.9 GFR>60; alk phos 146; albumin 3.9   TODAY  07-27-20: wbc 6.9; hgb 13.8; hct 41.9; mcv 95.2 plt 221; glucose 111; bun 19; creat 0.82; k+ 3.4; na++ 139; ca 8.9; GFR>60; liver normal albumin 3.6; chol 110; ldl 45; trig 107 hdl 44 07-30-20: k+ 3.6      Review of Systems  Constitutional: Negative for malaise/fatigue.  Respiratory: Negative for cough and shortness of breath.   Cardiovascular: Negative for chest pain, palpitations and leg swelling.  Gastrointestinal: Negative for abdominal pain, constipation and heartburn.  Musculoskeletal: Negative for back pain, joint pain and myalgias.  Skin: Negative.   Neurological: Negative for dizziness.  Psychiatric/Behavioral: The patient is not nervous/anxious.     Physical Exam Constitutional:      General: She is not in acute distress.    Appearance: She is well-developed. She is not diaphoretic.  Neck:     Thyroid: No thyromegaly.  Cardiovascular:     Rate and Rhythm: Normal rate and regular rhythm.     Pulses: Normal pulses.     Heart sounds: Murmur heard.    Pulmonary:     Effort: Pulmonary effort is normal. No respiratory distress.     Breath sounds: Normal breath sounds.  Abdominal:     General: Bowel sounds are normal. There is no distension.     Palpations: Abdomen is soft.     Tenderness: There is no abdominal tenderness.  Musculoskeletal:     Cervical back: Neck supple.     Right lower leg: No edema.     Left lower leg: No edema.     Comments: Is able to move all extremities Has some left side weakness present.   Lymphadenopathy:     Cervical: No cervical adenopathy.  Skin:    General: Skin is warm and dry.  Neurological:     Mental Status: She is alert. Mental status is at baseline.  Psychiatric:        Mood and Affect: Mood normal.     ASSESSMENT/ PLAN:   TODAY  1.  Essential hypertension: is stable b/p 114/69 will continue norvasc 10 mg daily cozaar 100 mg daily   2. Parkinson's disease is status post deep brain stimulator: is without change will continue sinemet cr 25/100 mg daily mirapex 1.5 mg nightly will monitor  3. OAB (overactive bladder) is stable will continue ditropan 5 mg twice daily   PREVIOUS   4. Mixed hyperlipidemia: is stable  will continue lipitor 40 mg daily   5. Major depression recurrent chronic: is stable will continue cymbalta 60 mg twice daily has xanax 0.5 mg twice daily as needed for 14 days trazadone 150 mg nightly   6. COPD without exacerbation: is stable will continue breztri aerosphere 160-9-4.8 mcg 2 puffs twice daily has albuterol inhaler 2 puffs every 4 hours as needed; mucinex 600 mg daily as needed  7. GERD without esophagitis: is stable will continue prilosec 20 mg twice daily   8. Chronic constipation: is stable will continue colace 100 mg every other day and miralax daily as needed   9. Chronic pain syndrome: is stable  will continue cymbalta 60 mg twice daily also takes for mood state.   10. Parkinson's dementia: is without change weight is 182 pounds; will monitor     Ok Edwards NP [facility]  Contact [phone] Monday through Friday 8am- 5pm  After hours call [phone]

## 2020-09-03 ENCOUNTER — Other Ambulatory Visit: Payer: Self-pay | Admitting: Adult Health

## 2020-09-03 ENCOUNTER — Encounter: Payer: Self-pay | Admitting: Adult Health

## 2020-09-03 ENCOUNTER — Non-Acute Institutional Stay (SKILLED_NURSING_FACILITY): Payer: Medicare Other | Admitting: Adult Health

## 2020-09-03 DIAGNOSIS — F339 Major depressive disorder, recurrent, unspecified: Secondary | ICD-10-CM

## 2020-09-03 MED ORDER — ALPRAZOLAM 0.5 MG PO TABS: 0.5000 mg | ORAL_TABLET | Freq: Two times a day (BID) | ORAL | 0 refills | Status: DC

## 2020-09-03 NOTE — Progress Notes (Signed)
Location:  [facility] Room Number: 108/D Place of Service:  SNF (31)   CODE STATUS: Full Code  Allergies  Allergen Reactions  . Oxycodone Other (See Comments)    Can tolerate low dose mixed with tynlelol HALLUCINATIONS   . Docosahexaenoic Acid   . Eicosapentaenoic Acid (Epa)   . Lutein   . Omega-3 Fatty Acids   . Other   . Aspirin Nausea And Vomiting and Other (See Comments)    Severe stomach pain   . Codeine Nausea Only    Makes her feel very sick     Chief Complaint  Patient presents with  . Acute Visit    Anxiety    HPI:  She is presently taking xanax 0.5 mg twice daily as needed. She feels anxious about having to ask for the medication. Her guardian would like for this medication to be made routine. She has been on this medication long term.   Past Medical History:  Diagnosis Date  . Anxiety   . Arthritis   . Complication of anesthesia   . Depression   . Dysphasia   . GERD (gastroesophageal reflux disease)   . Hyperlipidemia   . Hypertension   . Parkinson's disease ([place])    diagnosed at age 37  . 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 PONV (postoperative nausea and vomiting)    states it has been a long time since getting sick  . Sleep apnea    Has CPAP but doesnt use  . Stroke ([place])   . TIA (transient ischemic attack)    not really TIA but abnormal MRI per pt left sided weakness  . UTI (lower urinary tract infection)   . Vitamin D deficiency     Past Surgical History:  Procedure Laterality Date  . BACK SURGERY    . Battery change     DBS, 12/2009  . BIOPSY N/A 11/29/2013   Procedure: GASTRIC BIOPSY;  Surgeon: Danie Binder, MD;  [facility];  Service: Endoscopy;  Laterality: N/A;  . BURR HOLE W/ STEREOTACTIC INSERTION OF DBS LEADS / INTRAOP MICROELECTRODE RECORDING     2007  . CARPAL TUNNEL RELEASE Right   . COLONOSCOPY WITH PROPOFOL N/A 11/29/2013   Procedure: COLONOSCOPY WITH PROPOFOL;  Surgeon: Danie Binder, MD;  Location: [facility];  Service: Endoscopy;  Laterality: N/A;  entered cecum @ [phone]; total cecal withdrawal time 21 minutes   . ELBOW SURGERY Left    after fx  . ESOPHAGEAL DILATION N/A 11/29/2013   Procedure: ESOPHAGEAL DILATION;  Surgeon: Danie Binder, MD;  Location[facility];  Service: Endoscopy;  Laterality: N/A;  [facility] 14/15/16  . ESOPHAGOGASTRODUODENOSCOPY (EGD) WITH PROPOFOL N/A 11/29/2013   Procedure: ESOPHAGOGASTRODUODENOSCOPY (EGD) WITH PROPOFOL;  Surgeon: Danie Binder, MD;  [facility];  Service: Endoscopy;  Laterality: N/A;  . FOOT SURGERY Left    "something with 2nd 2."  . NECK SURGERY     Fusion  . POLYPECTOMY N/A 11/29/2013   Procedure: POLYPECTOMY;  Surgeon: Danie Binder, MD;  Location: [facility];  Service: Endoscopy;  Laterality: N/A;  Distal Transverse Colon x2   . PR ANALYZE NEUROSTIM BRAIN, FIRST 1H  10/15/2012      . PULSE GENERATOR IMPLANT Right 03/30/2018   Procedure: Right Implantable pulse generator change;  Surgeon: Erline Levine, MD;  Location: [facility];  Service: Neurosurgery;  Laterality: Right;  . SUBTHALAMIC STIMULATOR BATTERY REPLACEMENT N/A 08/05/2013   Procedure: SUBTHALAMIC STIMULATOR BATTERY REPLACEMENT;  Surgeon: Erline Levine, MD;  Location: [facility];  Service: Neurosurgery;  Laterality: N/A;  . SUBTHALAMIC STIMULATOR BATTERY REPLACEMENT N/A 11/26/2015   Procedure: Implantable pulse generator battery change for deep brain stimulator;  Surgeon: Erline Levine, MD;  Location: [facility];  Service: Neurosurgery;  Laterality: N/A;  . VAGINAL HYSTERECTOMY     "partial"  . WRIST SURGERY Right    after fx    Social History   Socioeconomic History  . Marital status: Widowed    Spouse name: Not on file  . Number of children: Not on file  . Years of education: Not on file  . Highest education level: Not on file  Occupational History  . Occupation: Retired  Tobacco Use  . Smoking status: Former Smoker    Packs/day: 0.50    Years: 25.00    Pack  years: 12.50    Types: Cigarettes    Quit date: 03/01/2019    Years since quitting: 1.5  . Smokeless tobacco: Never Used  Vaping Use  . Vaping Use: Never used  Substance and Sexual Activity  . Alcohol use: Not Currently  . Drug use: No  . Sexual activity: Not on file  Other Topics Concern  . Not on file  Social History Narrative   She resides at [facility] (assisted living facility).   Social Determinants of Health   Financial Resource Strain: Not on file  Food Insecurity: Not on file  Transportation Needs: Not on file  Physical Activity: Not on file  Stress: Not on file  Social Connections: Not on file  Intimate Partner Violence: Not on file   Family History  Problem Relation Age of Onset  . Colon cancer Paternal Uncle       VITAL SIGNS BP 125/67   Pulse 68   Temp (!) 97.2 F (36.2 C)   Resp 20   Ht 5' 2"  (1.575 m)   Wt 182 lb 9.6 oz (82.8 kg)   SpO2 96%   BMI 33.40 kg/m   Outpatient Encounter Medications as of 09/03/2020  Medication Sig  . acetaminophen (TYLENOL) 500 MG tablet Take 500 mg by mouth every 6 (six) hours as needed for mild pain or moderate pain.  Marland Kitchen albuterol (VENTOLIN HFA) 108 (90 Base) MCG/ACT inhaler Inhale 2 puffs into the lungs every 4 (four) hours as needed for wheezing or shortness of breath.  . ALPRAZolam (XANAX) 0.5 MG tablet Take 1 tablet (0.5 mg total) by mouth 2 (two) times daily as needed for up to 14 days for anxiety.  Marland Kitchen alum & mag hydroxide-simeth (MAALOX PLUS) 400-400-40 MG/5ML suspension Take 20 mLs by mouth 2 (two) times daily as needed for indigestion.  Marland Kitchen amLODipine (NORVASC) 10 MG tablet Take 10 mg by mouth daily.   Marland Kitchen atorvastatin (LIPITOR) 40 MG tablet Take 1 tablet (40 mg total) by mouth daily.  Marland Kitchen BREZTRI AEROSPHERE 160-9-4.8 MCG/ACT AERO Inhale 2 puffs into the lungs 2 (two) times daily.  . carbidopa-levodopa (SINEMET IR) 25-100 MG tablet Take 1 tablet by mouth daily in the afternoon.  . Cholecalciferol (VITAMIN D) 50 MCG (2000  UT) tablet Take 2,000 Units by mouth daily.  . clopidogrel (PLAVIX) 75 MG tablet Take 75 mg by mouth daily.  Marland Kitchen docusate sodium (COLACE) 100 MG capsule Take 100 mg by mouth every other day.  . DULoxetine (CYMBALTA) 60 MG capsule Take 60 mg by mouth daily.  Marland Kitchen guaiFENesin (MUCINEX) 600 MG 12 hr tablet Take 600 mg by mouth daily as needed.  Marland Kitchen  losartan (COZAAR) 100 MG tablet Take 100 mg by mouth daily.  Marland Kitchen omeprazole (PRILOSEC) 20 MG capsule Take 20 mg by mouth in the morning and at bedtime.   Marland Kitchen oxybutynin (DITROPAN) 5 MG tablet Take 5 mg by mouth 2 (two) times daily.  . polyethylene glycol (MIRALAX / GLYCOLAX) 17 g packet Take 17 g by mouth daily as needed.  . pramipexole (MIRAPEX) 1 MG tablet Take 1.5 mg by mouth at bedtime.   . traZODone (DESYREL) 150 MG tablet Take 150 mg by mouth at bedtime.   Marland Kitchen UNABLE TO FIND Diet Change: Dys 3 (chopped) diet, continue thin liquids; will allow pizza if requested  Diet - Liquids: _x __Regular; ___Thickened ___ Consistency: ___ Nectar, ___Honey, ___ Pudding; ____ Fluid Restriction   No facility-administered encounter medications on file as of 09/03/2020.     SIGNIFICANT DIAGNOSTIC EXAMS   LABS REVIEWED PREVIOUS  05-10-20: wbc 6.2; hgb 14.8; hct 44.7; mcv 94.9 plt 251; glucose 122; bun 14; creat 0.67; k+ 3.5; na++ 140 ca 8.9 GFR>60; alk phos 146; albumin 3.9  07-27-20: wbc 6.9; hgb 13.8; hct 41.9; mcv 95.2 plt 221; glucose 111; bun 19; creat 0.82; k+ 3.4; na++ 139; ca 8.9; GFR>60; liver normal albumin 3.6; chol 110; ldl 45; trig 107 hdl 44 07-30-20: k+ 3.6   NO NEW LABS.   Review of Systems  Constitutional: Negative for malaise/fatigue.  Respiratory: Negative for cough and shortness of breath.   Cardiovascular: Negative for chest pain, palpitations and leg swelling.  Gastrointestinal: Negative for abdominal pain, constipation and heartburn.  Musculoskeletal: Negative for back pain, joint pain and myalgias.  Skin: Negative.   Neurological: Negative for  dizziness.  Psychiatric/Behavioral: The patient is nervous/anxious.     Physical Exam Constitutional:      General: She is not in acute distress.    Appearance: She is well-developed. She is not diaphoretic.  Neck:     Thyroid: No thyromegaly.  Cardiovascular:     Rate and Rhythm: Normal rate and regular rhythm.     Pulses: Normal pulses.     Heart sounds: Murmur heard.    Pulmonary:     Effort: Pulmonary effort is normal. No respiratory distress.     Breath sounds: Normal breath sounds.  Abdominal:     General: Bowel sounds are normal. There is no distension.     Palpations: Abdomen is soft.     Tenderness: There is no abdominal tenderness.  Musculoskeletal:     Cervical back: Neck supple.     Right lower leg: No edema.     Left lower leg: No edema.     Comments:  Is able to move all extremities Has some left side weakness present.    Lymphadenopathy:     Cervical: No cervical adenopathy.  Skin:    General: Skin is warm and dry.  Neurological:     Mental Status: She is alert. Mental status is at baseline.  Psychiatric:        Mood and Affect: Mood normal.       ASSESSMENT/ PLAN:  TODAY  1. Major depression recurrent chronic  Will change to xanax 0.5 mg twice daily on a routine basis will monitor her status.    Ok Edwards NP [facility]  Contact [phone] Monday through Friday 8am- 5pm  After hours call [phone]

## 2020-09-06 ENCOUNTER — Other Ambulatory Visit: Payer: Self-pay | Admitting: Neurosurgery

## 2020-09-07 NOTE — H&P (Signed)
Patient ID:   [id number] Patient: Courtney Tucker  Date of Birth: [birth year] Visit Type: Office Visit   Date: 09/05/2020 01:30 PM Provider: Marchia Meiers. Vertell Limber MD   This 75 year old female presents for DBS.  HISTORY OF PRESENT ILLNESS:  1.  DBS  Patient returns for review of cervical and lumbar CT myelogram September 2021.  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  2.51 volts at ERI.  Patient's voice has weakened significantly since her August 2021 visit.  She motions to the lumbar region when asked about pain.  SNF facility rep advises an appointment has been made at [facility] for May 16th for evaluation and management of her Parkinson's. (patient's [facility] appointment was erroneously canceled by SNF last month.)  The patient is tearful and is complaining of back and neck pain.  We reviewed her previous myelogram and post myelographic CT scans.  I have not recommended any treatments for her back or neck at this point.  We will go ahead with IPG revision and further evaluation for Parkinson's management at [facility].  She will follow-up with me after that.         Medical/Surgical/Interim History Reviewed, no change.  Last detailed document date:11/19/2015.     PAST MEDICAL HISTORY, SURGICAL HISTORY, FAMILY HISTORY, SOCIAL HISTORY AND REVIEW OF SYSTEMS I have reviewed the patient's past medical, surgical, family and social history as well as the comprehensive review of systems as included on the [facility] history form dated 09/05/2020, which I have signed.  Family History:  Reviewed, no changes.  Last detailed document date:11/19/2015.   Social History: Reviewed, no changes. Last detailed document date: 11/19/2015.    MEDICATIONS: (added, continued or stopped this visit) Started Medication Directions Instruction Stopped   Breztri Aerosphere 160 mcg-69mcg-4.8mcg/actuation HFA aerosol  inhaler inhale 2 puff by inhalation route 2 times every day in the morning and evening     carbidopa ER 25 mg-levodopa 100 mg tablet,extended release      COZAAR take 1 tablet by oral route  every day     Cymbalta Take 1 tablet by mouth daily     hydrocodone  ORAL take 1 tablet by mouth as needed for pain     Mirapex take 1 tablet by oral route  every day     Norvasc take 1 tablet by oral route  every day     oxybutynin chloride 5 mg tablet      Plavix take 1 tablet by oral route once     ProAir HFA 90 mcg/actuation aerosol inhaler inhale 2 puff by inhalation route  every 4 - 6 hours as needed     tramadol 50 mg tablet      trazodone 100 mg tablet      Vesicare take 1 tablet by oral route  every day     Vitamin D3 50 mcg (2,000 unit) capsule      Xanax take 1 tablet by oral route 3 times every day       ALLERGIES: Ingredient Reaction Medication Name Comment  MULTIVITAMIN WITH MINERALS COMBINATION NO.22  Tozal   ASPIRIN     LUTEIN  Tozal   DOCOSAHEXAENOIC ACID  Tozal   OMEGA-3 FATTY ACIDS  Tozal   EICOSAPENTAENOIC ACID  Tozal    Reviewed, no changes.    PHYSICAL EXAM:   Vitals Date Temp F BP Pulse Ht In Wt Lb BMI BSA Pain Score  09/05/2020  105/69 81 62    4/10      IMPRESSION:   Depleted IPG.  Patient is going to follow up at [facility] for Parkinson's management.  We will revise her IPG.  Patient is on an anti-coagulant, anti-inflammatory or supplement that may increase bleeding time. Patient advised to stop medicine prior to surgery.  Comments:  Skilled nursing facility rep instructed to hold patient's Plavix starting Monday for Thursday surgery  PLAN:  Right chest IPG replacement is scheduled for next Thursday April 21st at [place] [facility] with [employer] is aware and plans to be in attendance.  Patient care rep from skilled nursing facility is instructed to hold patient's Plavix starting Monday in preparation for Thursday  surgery.   Assessment/Plan   # Detail Type Description   1. Assessment End of battery life of deep brain stimulator (Z46.2).       2. Assessment Parkinsons disease (G20).       3. Assessment Low back pain, unspecified back pain laterality, unspecified chronicity, unspecified whether sciatica present (M54.50).         Pain Management Plan Pain Scale: 4/10. Method: Numeric Pain Intensity Scale. Location: back. Onset: 06/18/2012. Duration: varies. Quality: discomforting. Pain management follow-up plan of care: Patient will continue medication management..              Provider:  Marchia Meiers. Vertell Limber MD  09/07/2020 02:54 PM    Dictation edited by: Marchia Meiers. Vertell Limber    CC Providers: Sharlyne Cai [facility] [address] [place],  [place]  [id number]-   Rebecca Tat  [address] [place], [place] [address]               Electronically signed by Marchia Meiers. Vertell Limber MD on 09/07/2020 02:54 PM

## 2020-09-12 NOTE — Progress Notes (Signed)
Surgical Instructions    Your procedure is scheduled on 09/13/20.  Report to [facility] Main Entrance "A" at 9:30 A.M., then check in with the Admitting office.  Call this number if you have problems the morning of surgery:  [phone]   If you have any questions prior to your surgery date call [phone]: Open Monday-Friday 8am-4pm    Remember:  Do not eat or drink after midnight the night before your surgery    Take these medicines the morning of surgery with A SIP OF WATER: amLODipine (NORVASC) atorvastatin (LIPITOR)  carbidopa-levodopa (SINEMET IR) DULoxetine (CYMBALTA) oxybutynin (DITROPAN) BREZTRI Inhaler  If Needed:  acetaminophen (TYLENOL)  albuterol (VENTOLIN HFA)   As of today, STOP taking any Aspirin (unless otherwise instructed by your surgeon) Aleve, Naproxen, Ibuprofen, Motrin, Advil, Goody's, BC's, all herbal medications, fish oil, and all vitamins.  Follow your surgeon's instructions on when to stop Plavix.  If no instructions were given by your surgeon then you will need to call the office to get those instructions.                        Do not wear jewelry, make up, or nail polish            Do not wear lotions, powders, perfumes or deodorant.            Do not shave 48 hours prior to surgery.              Do not bring valuables to the hospital.            [facility] is not responsible for any belongings or valuables.   Wear Clean/Comfortable clothing the morning of surgery Do not apply any deodorants/lotions.   Remember to brush your teeth WITH YOUR REGULAR TOOTHPASTE.

## 2020-09-12 NOTE — Progress Notes (Signed)
  EKG: 05/10/20 CXR: 12/ 16/21 ECHO: 01/23/20 Stress Test: unknown Cardiac Cath: unknown  Fasting Blood Sugar- na Checks Blood Sugar__na_ times a day  OSA: Yes CPAP: No  ASA: No Blood Thinners:  Last dose Plavix 09/09/20  Covid test DOS  Anesthesia Review: yes, Plavix  Patient denies shortness of breath, fever, cough, and chest pain at PAT appointment.  Patient verbalized understanding of instructions provided today at the PAT appointment.  Patient asked to review instructions at home and day of surgery.

## 2020-09-12 NOTE — Progress Notes (Addendum)
Pt resides at [facility].  Has only been there for 2-3 weeks.  Completed preop call with Inez Catalina, RN.  Faxed instructions to her attention.  [phone]  Patient is a ward of the state and guardian will be here DOS to sign consents

## 2020-09-13 ENCOUNTER — Ambulatory Visit (HOSPITAL_COMMUNITY): Payer: Medicare Other | Admitting: Anesthesiology

## 2020-09-13 ENCOUNTER — Inpatient Hospital Stay: Admit: 2020-09-13 | Payer: Medicare Other | Admitting: *Deleted

## 2020-09-13 ENCOUNTER — Inpatient Hospital Stay
Admission: RE | Admit: 2020-09-13 | Discharge: 2021-01-21 | Disposition: A | Discharge status: Other Acute Inpatient Hospital | Payer: Medicare Other | Source: Ambulatory Visit | Attending: Internal Medicine | Admitting: Internal Medicine

## 2020-09-13 ENCOUNTER — Other Ambulatory Visit: Payer: Self-pay

## 2020-09-13 ENCOUNTER — Encounter (HOSPITAL_COMMUNITY)
Admission: RE | Disposition: A | Discharge status: Skilled Nursing Facility | Payer: Self-pay | Source: Home / Self Care | Attending: Neurosurgery

## 2020-09-13 ENCOUNTER — Encounter (HOSPITAL_COMMUNITY): Payer: Self-pay | Admitting: Neurosurgery

## 2020-09-13 ENCOUNTER — Ambulatory Visit (HOSPITAL_COMMUNITY)
Admission: RE | Admit: 2020-09-13 | Discharge: 2020-09-13 | Disposition: A | Discharge status: Skilled Nursing Facility | Payer: Medicare Other | Attending: Neurosurgery | Admitting: Neurosurgery

## 2020-09-13 DIAGNOSIS — Z20822 Contact with and (suspected) exposure to covid-19: Secondary | ICD-10-CM | POA: Diagnosis not present

## 2020-09-13 DIAGNOSIS — Z79899 Other long term (current) drug therapy: Secondary | ICD-10-CM | POA: Insufficient documentation

## 2020-09-13 DIAGNOSIS — Z4542 Encounter for adjustment and management of neuropacemaker (brain) (peripheral nerve) (spinal cord): Secondary | ICD-10-CM | POA: Diagnosis not present

## 2020-09-13 DIAGNOSIS — G20 Parkinson's disease: Secondary | ICD-10-CM | POA: Insufficient documentation

## 2020-09-13 DIAGNOSIS — Z888 Allergy status to other drugs, medicaments and biological substances status: Secondary | ICD-10-CM | POA: Diagnosis not present

## 2020-09-13 DIAGNOSIS — Z886 Allergy status to analgesic agent status: Secondary | ICD-10-CM | POA: Diagnosis not present

## 2020-09-13 HISTORY — PX: SUBTHALAMIC STIMULATOR BATTERY REPLACEMENT: SHX5405

## 2020-09-13 LAB — SARS CORONAVIRUS 2 BY RT PCR (HOSPITAL ORDER, PERFORMED IN ~~LOC~~ HOSPITAL LAB): SARS Coronavirus 2: NEGATIVE

## 2020-09-13 LAB — BASIC METABOLIC PANEL
Anion gap: 10 (ref 5–15)
BUN: 15 mg/dL (ref 8–23)
CO2: 28 mmol/L (ref 22–32)
Calcium: 9.9 mg/dL (ref 8.9–10.3)
Chloride: 101 mmol/L (ref 98–111)
Creatinine, Ser: 0.86 mg/dL (ref 0.44–1.00)
GFR, Estimated: >60 mL/min (ref >60)
Glucose, Bld: 122 mg/dL — ABNORMAL HIGH (ref 70–99)
Potassium: 4.5 mmol/L (ref 3.5–5.1)
Sodium: 139 mmol/L (ref 135–145)

## 2020-09-13 LAB — CBC
HCT: 52.6 % — ABNORMAL HIGH (ref 36.0–46.0)
Hemoglobin: 17.2 g/dL — ABNORMAL HIGH (ref 12.0–15.0)
MCH: 31.3 pg (ref 26.0–34.0)
MCHC: 32.7 g/dL (ref 30.0–36.0)
MCV: 95.8 fL (ref 80.0–100.0)
Platelets: 248 10*3/uL (ref 150–400)
RBC: 5.49 MIL/uL — ABNORMAL HIGH (ref 3.87–5.11)
RDW: 12.3 % (ref 11.5–15.5)
WBC: 8.3 10*3/uL (ref 4.0–10.5)
nRBC: 0 % (ref 0.0–0.2)

## 2020-09-13 SURGERY — SUBTHALAMIC STIMULATOR BATTERY REPLACEMENT
Anesthesia: General | Laterality: Right

## 2020-09-13 MED ORDER — PHENYLEPHRINE 40 MCG/ML (10ML) SYRINGE FOR IV PUSH (FOR BLOOD PRESSURE SUPPORT)
PREFILLED_SYRINGE | INTRAVENOUS | Status: AC
  Filled 2020-09-13: qty 10

## 2020-09-13 MED ORDER — 0.9 % SODIUM CHLORIDE (POUR BTL) OPTIME
TOPICAL | Status: DC | PRN
  Administered 2020-09-13: 1000 mL

## 2020-09-13 MED ORDER — BUPIVACAINE HCL (PF) 0.5 % IJ SOLN
INTRAMUSCULAR | Status: DC | PRN
  Administered 2020-09-13: 5 mL

## 2020-09-13 MED ORDER — CHLORHEXIDINE GLUCONATE CLOTH 2 % EX PADS: 6.0000 | MEDICATED_PAD | Freq: Once | CUTANEOUS | Status: DC

## 2020-09-13 MED ORDER — LIDOCAINE-EPINEPHRINE 1 %-1:100000 IJ SOLN
INTRAMUSCULAR | Status: AC
  Filled 2020-09-13: qty 1

## 2020-09-13 MED ORDER — VANCOMYCIN HCL 1000 MG IV SOLR
INTRAVENOUS | Status: AC
  Filled 2020-09-13: qty 1000

## 2020-09-13 MED ORDER — ONDANSETRON HCL 4 MG/2ML IJ SOLN
INTRAMUSCULAR | Status: DC | PRN
  Administered 2020-09-13: 4 mg via INTRAVENOUS

## 2020-09-13 MED ORDER — FENTANYL CITRATE (PF) 100 MCG/2ML IJ SOLN
25.0000 ug | INTRAMUSCULAR | Status: DC | PRN
  Administered 2020-09-13: 25 ug via INTRAVENOUS

## 2020-09-13 MED ORDER — VANCOMYCIN HCL 1000 MG IV SOLR
INTRAVENOUS | Status: DC | PRN
  Administered 2020-09-13: 1000 mg

## 2020-09-13 MED ORDER — ONDANSETRON HCL 4 MG/2ML IJ SOLN: 4.0000 mg | Freq: Four times a day (QID) | INTRAMUSCULAR | Status: DC | PRN

## 2020-09-13 MED ORDER — PHENYLEPHRINE 40 MCG/ML (10ML) SYRINGE FOR IV PUSH (FOR BLOOD PRESSURE SUPPORT)
PREFILLED_SYRINGE | INTRAVENOUS | Status: DC | PRN
  Administered 2020-09-13 (×2): 80 ug via INTRAVENOUS

## 2020-09-13 MED ORDER — PROPOFOL 10 MG/ML IV BOLUS
INTRAVENOUS | Status: AC
  Filled 2020-09-13: qty 20

## 2020-09-13 MED ORDER — LACTATED RINGERS IV SOLN: INTRAVENOUS | Status: DC

## 2020-09-13 MED ORDER — DEXAMETHASONE SODIUM PHOSPHATE 10 MG/ML IJ SOLN
INTRAMUSCULAR | Status: AC
  Filled 2020-09-13: qty 1

## 2020-09-13 MED ORDER — BACITRACIN ZINC 500 UNIT/GM EX OINT
TOPICAL_OINTMENT | CUTANEOUS | Status: AC
  Filled 2020-09-13: qty 28.35

## 2020-09-13 MED ORDER — CHLORHEXIDINE GLUCONATE 0.12 % MT SOLN
15.0000 mL | Freq: Once | OROMUCOSAL | Status: AC
  Administered 2020-09-13: 15 mL via OROMUCOSAL
  Filled 2020-09-13: qty 15

## 2020-09-13 MED ORDER — BUPIVACAINE HCL (PF) 0.5 % IJ SOLN
INTRAMUSCULAR | Status: AC
  Filled 2020-09-13: qty 30

## 2020-09-13 MED ORDER — ORAL CARE MOUTH RINSE: 15.0000 mL | Freq: Once | OROMUCOSAL | Status: AC

## 2020-09-13 MED ORDER — DEXAMETHASONE SODIUM PHOSPHATE 10 MG/ML IJ SOLN
INTRAMUSCULAR | Status: DC | PRN
  Administered 2020-09-13: 4 mg via INTRAVENOUS

## 2020-09-13 MED ORDER — FENTANYL CITRATE (PF) 100 MCG/2ML IJ SOLN
INTRAMUSCULAR | Status: AC
  Filled 2020-09-13: qty 2

## 2020-09-13 MED ORDER — FENTANYL CITRATE (PF) 250 MCG/5ML IJ SOLN
INTRAMUSCULAR | Status: AC
  Filled 2020-09-13: qty 5

## 2020-09-13 MED ORDER — CEFAZOLIN SODIUM-DEXTROSE 2-4 GM/100ML-% IV SOLN
2.0000 g | INTRAVENOUS | Status: AC
  Administered 2020-09-13: 2 g via INTRAVENOUS
  Filled 2020-09-13: qty 100

## 2020-09-13 MED ORDER — ONDANSETRON HCL 4 MG/2ML IJ SOLN
INTRAMUSCULAR | Status: AC
  Filled 2020-09-13: qty 2

## 2020-09-13 MED ORDER — FENTANYL CITRATE (PF) 250 MCG/5ML IJ SOLN
INTRAMUSCULAR | Status: DC | PRN
  Administered 2020-09-13: 50 ug via INTRAVENOUS
  Administered 2020-09-13: 25 ug via INTRAVENOUS

## 2020-09-13 MED ORDER — LIDOCAINE 2% (20 MG/ML) 5 ML SYRINGE
INTRAMUSCULAR | Status: DC | PRN
  Administered 2020-09-13: 60 mg via INTRAVENOUS

## 2020-09-13 MED ORDER — LIDOCAINE-EPINEPHRINE 1 %-1:100000 IJ SOLN
INTRAMUSCULAR | Status: DC | PRN
  Administered 2020-09-13: 5 mL

## 2020-09-13 MED ORDER — PROPOFOL 10 MG/ML IV BOLUS
INTRAVENOUS | Status: DC | PRN
  Administered 2020-09-13: 20 mg via INTRAVENOUS
  Administered 2020-09-13: 50 mg via INTRAVENOUS
  Administered 2020-09-13: 150 mg via INTRAVENOUS

## 2020-09-13 SURGICAL SUPPLY — 40 items
CANISTER SUCT 3000ML PPV (MISCELLANEOUS) ×2 IMPLANT
CARTRIDGE OIL MAESTRO DRILL (MISCELLANEOUS) ×1 IMPLANT
CONNECTOR PLUG BORE DBS STIM (Connector) ×2 IMPLANT
COVER WAND RF STERILE (DRAPES) ×2 IMPLANT
DECANTER SPIKE VIAL GLASS SM (MISCELLANEOUS) ×2 IMPLANT
DERMABOND ADVANCED (GAUZE/BANDAGES/DRESSINGS) ×1
DERMABOND ADVANCED .7 DNX12 (GAUZE/BANDAGES/DRESSINGS) ×1 IMPLANT
DIFFUSER DRILL AIR PNEUMATIC (MISCELLANEOUS) ×2 IMPLANT
DRAPE CAMERA VIDEO/LASER (DRAPES) ×2 IMPLANT
DRAPE LAPAROTOMY 100X72 PEDS (DRAPES) ×2 IMPLANT
DRSG OPSITE POSTOP 4X6 (GAUZE/BANDAGES/DRESSINGS) ×2 IMPLANT
DURAPREP 26ML APPLICATOR (WOUND CARE) ×2 IMPLANT
GAUZE 4X4 16PLY RFD (DISPOSABLE) IMPLANT
GLOVE BIO SURGEON STRL SZ8 (GLOVE) ×2 IMPLANT
GLOVE BIOGEL PI IND STRL 8.5 (GLOVE) ×1 IMPLANT
GLOVE BIOGEL PI INDICATOR 8.5 (GLOVE) ×1
GLOVE ECLIPSE 8.0 STRL XLNG CF (GLOVE) ×2 IMPLANT
GLOVE EXAM NITRILE XL STR (GLOVE) IMPLANT
GLOVE SRG 8 PF TXTR STRL LF DI (GLOVE) ×1 IMPLANT
GLOVE SURG UNDER POLY LF SZ8 (GLOVE) ×2
GOWN STRL REUS W/ TWL LRG LVL3 (GOWN DISPOSABLE) IMPLANT
GOWN STRL REUS W/ TWL XL LVL3 (GOWN DISPOSABLE) ×1 IMPLANT
GOWN STRL REUS W/TWL 2XL LVL3 (GOWN DISPOSABLE) ×2 IMPLANT
GOWN STRL REUS W/TWL LRG LVL3 (GOWN DISPOSABLE)
GOWN STRL REUS W/TWL XL LVL3 (GOWN DISPOSABLE) ×2
KIT BASIN OR (CUSTOM PROCEDURE TRAY) ×2 IMPLANT
KIT HANDSET DBS COM PERCEPT PC (NEUROSURGERY SUPPLIES) ×2 IMPLANT
KIT TURNOVER KIT B (KITS) ×2 IMPLANT
NEEDLE HYPO 25X1 1.5 SAFETY (NEEDLE) ×2 IMPLANT
NEUROSTIM DBS PERCEPT PC (Neurostimulator) ×1 IMPLANT
NEUROSTIMULATOR DBS PERCEPT PC (Neurostimulator) ×2 IMPLANT
NS IRRIG 1000ML POUR BTL (IV SOLUTION) ×2 IMPLANT
OIL CARTRIDGE MAESTRO DRILL (MISCELLANEOUS) ×2
PACK LAMINECTOMY NEURO (CUSTOM PROCEDURE TRAY) ×2 IMPLANT
PAD ARMBOARD 7.5X6 YLW CONV (MISCELLANEOUS) ×6 IMPLANT
SUT VIC AB 2-0 CP2 18 (SUTURE) ×2 IMPLANT
SUT VIC AB 3-0 SH 8-18 (SUTURE) ×4 IMPLANT
TOWEL GREEN STERILE (TOWEL DISPOSABLE) ×2 IMPLANT
TOWEL GREEN STERILE FF (TOWEL DISPOSABLE) ×2 IMPLANT
WATER STERILE IRR 1000ML POUR (IV SOLUTION) ×2 IMPLANT

## 2020-09-13 NOTE — Progress Notes (Signed)
Patient waiting for transportation. Vital signs stable. No further monitoring required.   Gaylan Gerold

## 2020-09-13 NOTE — Op Note (Signed)
09/13/2020  1:54 PM  PATIENT:  Courtney Tucker  75 y.o. female  PRE-OPERATIVE DIAGNOSIS:  End of battery life of deep brain stimulator for Parkinson's Disease  POST-OPERATIVE DIAGNOSIS:   End of battery life of deep brain stimulator for Parkinson's Disease  PROCEDURE:  Procedure(s) with comments: Right chest implantable pulse generator change for depleted battery (Right) - 3C/RM 18  SURGEON:  Surgeon(s) and Role:    Erline Levine, MD - Primary  PHYSICIAN ASSISTANT:   ASSISTANTS: Poteat, RN   ANESTHESIA:   general  EBL: Minimal  BLOOD ADMINISTERED:none  DRAINS: none   LOCAL MEDICATIONS USED:  MARCAINE    and LIDOCAINE   SPECIMEN:  No Specimen  DISPOSITION OF SPECIMEN:  N/A  COUNTS:  YES  TOURNIQUET:  * No tourniquets in log *  DICTATION: Patient has implanted subthalamic stimulator electrodes and IPG, which is now depleted.  It was elected for patient to undergo IPG revision.  PROCEDURE: Patient was brought to the operating room and given LMA general anesthesia  Right upper chest was prepped with betadine scrub and Duraprep.  Area of planned incision was infiltrated with lidocaine.  Prior incision was reopened and the old IPG was externalized.  Adaptor was connected to new IPG which was placed in the pocket. Impedances were checked and found to be within range.  IPG was Percept PC BrainSense  [employer] device. Wound was irrigated with vancomycin. Then irrigated once more.  Incision was closed with 2-0 Vicryl and 3-0 vicryl sutures and dressed with a sterile occlusive dressing.  Counts were correct at the end of the case.  PLAN OF CARE: Discharge to home after PACU  PATIENT DISPOSITION:  PACU - hemodynamically stable.   Delay start of Pharmacological VTE agent (>24hrs) due to surgical blood loss or risk of bleeding: yes

## 2020-09-13 NOTE — Brief Op Note (Signed)
09/13/2020  1:54 PM  PATIENT:  Courtney Tucker  75 y.o. female  PRE-OPERATIVE DIAGNOSIS:  End of battery life of deep brain stimulator for Parkinson's Disease  POST-OPERATIVE DIAGNOSIS:   End of battery life of deep brain stimulator for Parkinson's Disease  PROCEDURE:  Procedure(s) with comments: Right chest implantable pulse generator change for depleted battery (Right) - 3C/RM 18  SURGEON:  Surgeon(s) and Role:    Erline Levine, MD - Primary  PHYSICIAN ASSISTANT:   ASSISTANTS: Poteat, RN   ANESTHESIA:   general  EBL: Minimal  BLOOD ADMINISTERED:none  DRAINS: none   LOCAL MEDICATIONS USED:  MARCAINE    and LIDOCAINE   SPECIMEN:  No Specimen  DISPOSITION OF SPECIMEN:  N/A  COUNTS:  YES  TOURNIQUET:  * No tourniquets in log *  DICTATION: Patient has implanted subthalamic stimulator electrodes and IPG, which is now depleted.  It was elected for patient to undergo IPG revision.  PROCEDURE: Patient was brought to the operating room and given LMA general anesthesia  Right upper chest was prepped with betadine scrub and Duraprep.  Area of planned incision was infiltrated with lidocaine.  Prior incision was reopened and the old IPG was externalized.  Adaptor was connected to new IPG which was placed in the pocket. Impedances were checked and found to be within range.  IPG was Percept PC BrainSense  [employer] device. Wound was irrigated with vancomycin. Then irrigated once more.  Incision was closed with 2-0 Vicryl and 3-0 vicryl sutures and dressed with a sterile occlusive dressing.  Counts were correct at the end of the case.  PLAN OF CARE: Discharge to home after PACU  PATIENT DISPOSITION:  PACU - hemodynamically stable.   Delay start of Pharmacological VTE agent (>24hrs) due to surgical blood loss or risk of bleeding: yes

## 2020-09-13 NOTE — Anesthesia Preprocedure Evaluation (Signed)
Anesthesia Evaluation  Patient identified by MRN, date of birth, ID band Patient awake    Reviewed: Allergy & Precautions, H&P , NPO status , Patient's Chart, lab work & pertinent test results  History of Anesthesia Complications (+) PONV and history of anesthetic complications  Airway Mallampati: II   Neck ROM: full    Dental   Pulmonary sleep apnea , COPD, former smoker,    breath sounds clear to auscultation       Cardiovascular hypertension,  Rhythm:regular Rate:Normal     Neuro/Psych PSYCHIATRIC DISORDERS Anxiety Depression Dementia Parkinson's dz TIACVA    GI/Hepatic GERD  ,  Endo/Other    Renal/GU      Musculoskeletal  (+) Arthritis ,   Abdominal   Peds  Hematology   Anesthesia Other Findings   Reproductive/Obstetrics                             Anesthesia Physical Anesthesia Plan  ASA: III  Anesthesia Plan: General   Post-op Pain Management:    Induction: Intravenous  PONV Risk Score and Plan: 4 or greater and Ondansetron, Dexamethasone, Midazolam and Treatment may vary due to age or medical condition  Airway Management Planned: Oral ETT  Additional Equipment:   Intra-op Plan:   Post-operative Plan: Extubation in OR  Informed Consent: I have reviewed the patients History and Physical, chart, labs and discussed the procedure including the risks, benefits and alternatives for the proposed anesthesia with the patient or authorized representative who has indicated his/her understanding and acceptance.     Dental advisory given  Plan Discussed with: CRNA, Anesthesiologist and Surgeon  Anesthesia Plan Comments:         Anesthesia Quick Evaluation

## 2020-09-13 NOTE — Interval H&P Note (Signed)
History and Physical Interval Note:  09/13/2020 12:53 PM  Courtney Tucker  has presented today for surgery, with the diagnosis of End of battery life of deep brain stimulator.  The various methods of treatment have been discussed with the patient and family. After consideration of risks, benefits and other options for treatment, the patient has consented to  Procedure(s) with comments: Right chest implantable pulse generator change for depleted battery (Right) - 3C/RM 18 as a surgical intervention.  The patient's history has been reviewed, patient examined, no change in status, stable for surgery.  I have reviewed the patient's chart and labs.  Questions were answered to the patient's satisfaction.     Peggyann Shoals

## 2020-09-13 NOTE — Anesthesia Procedure Notes (Signed)
Procedure Name: LMA Insertion Date/Time: 09/13/2020 1:04 PM Performed by: Kyung Rudd, CRNA Pre-anesthesia Checklist: Patient identified, Emergency Drugs available, Suction available and Patient being monitored Patient Re-evaluated:Patient Re-evaluated prior to induction Oxygen Delivery Method: Circle System Utilized Preoxygenation: Pre-oxygenation with 100% oxygen Induction Type: IV induction Ventilation: Mask ventilation without difficulty LMA: LMA inserted LMA Size: 4.0 Number of attempts: 1 Airway Equipment and Method: Bite block Placement Confirmation: positive ETCO2 Tube secured with: Tape Dental Injury: Teeth and Oropharynx as per pre-operative assessment

## 2020-09-13 NOTE — Transfer of Care (Signed)
Immediate Anesthesia Transfer of Care Note  Patient: Courtney Tucker  Procedure(s) Performed: Right chest implantable pulse generator change for depleted battery (Right )  Patient Location: PACU  Anesthesia Type:General  Level of Consciousness: awake, alert  and oriented  Airway & Oxygen Therapy: Patient Spontanous Breathing and Patient connected to nasal cannula oxygen  Post-op Assessment: Report given to RN, Post -op Vital signs reviewed and stable and Patient moving all extremities  Post vital signs: Reviewed and stable  Last Vitals:  Vitals Value Taken Time  BP 149/72 09/13/20 1358  Temp    Pulse 95 09/13/20 1401  Resp 20 09/13/20 1401  SpO2 100 % 09/13/20 1401  Vitals shown include unvalidated device data.  Last Pain:  Vitals:   09/13/20 1038  TempSrc:   PainSc: 0-No pain         Complications: No complications documented.

## 2020-09-14 ENCOUNTER — Encounter (HOSPITAL_COMMUNITY): Payer: Self-pay | Admitting: Neurosurgery

## 2020-09-14 NOTE — Anesthesia Postprocedure Evaluation (Signed)
Anesthesia Post Note  Patient: Courtney Tucker  Procedure(s) Performed: Right chest implantable pulse generator change for depleted battery (Right )     Patient location during evaluation: PACU Anesthesia Type: General Level of consciousness: awake and alert Pain management: pain level controlled Vital Signs Assessment: post-procedure vital signs reviewed and stable Respiratory status: spontaneous breathing, nonlabored ventilation, respiratory function stable and patient connected to nasal cannula oxygen Cardiovascular status: blood pressure returned to baseline and stable Postop Assessment: no apparent nausea or vomiting Anesthetic complications: no   No complications documented.  Last Vitals:  Vitals:   09/13/20 1415 09/13/20 1430  BP: (!) 136/53 119/63  Pulse: 93 90  Resp: (!) 25 16  Temp:  (!) 36.4 C  SpO2: 95% 95%    Last Pain:  Vitals:   09/13/20 1038  TempSrc:   PainSc: 0-No pain                 Zayla Agar S

## 2020-09-24 ENCOUNTER — Encounter: Payer: Self-pay | Admitting: Adult Health

## 2020-09-24 ENCOUNTER — Non-Acute Institutional Stay (SKILLED_NURSING_FACILITY): Payer: Medicare Other | Admitting: Adult Health

## 2020-09-24 DIAGNOSIS — J449 Chronic obstructive pulmonary disease, unspecified: Secondary | ICD-10-CM | POA: Diagnosis not present

## 2020-09-24 DIAGNOSIS — F339 Major depressive disorder, recurrent, unspecified: Secondary | ICD-10-CM

## 2020-09-24 DIAGNOSIS — E782 Mixed hyperlipidemia: Secondary | ICD-10-CM

## 2020-09-24 NOTE — Progress Notes (Signed)
Location:  [place] Room Number: 814 Place of Service:  SNF (31)   CODE STATUS: full code   Allergies  Allergen Reactions  . Oxycodone Other (See Comments)    Can tolerate low dose mixed with tynlelol HALLUCINATIONS   . Docosahexaenoic Acid   . Eicosapentaenoic Acid (Epa)   . Lutein   . Omega-3 Fatty Acids   . Other   . Aspirin Nausea And Vomiting and Other (See Comments)    Severe stomach pain   . Codeine Nausea Only    Makes her feel very sick     Chief Complaint  Patient presents with  . Medical Management of Chronic Issues         Mixed hyperlipidemia:    Major depression recurrent chronic:  COPD without exacerbation:    HPI:  She is a 75 year old long term resident of this facility being seen for the management of her chronic illnesses; Mixed hyperlipidemia:    Major depression recurrent chronic:  COPD without exacerbation. There are no reports of anxiety; no reports of changes in appetite; no reports of insomnia.   Past Medical History:  Diagnosis Date  . Anxiety   . Arthritis   . Complication of anesthesia   . Depression   . Dysphasia   . GERD (gastroesophageal reflux disease)   . Hyperlipidemia   . Hypertension   . Parkinson's disease ([place])    diagnosed at age 52  . 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 PONV (postoperative nausea and vomiting)    states it has been a long time since getting sick  . Sleep apnea    Has CPAP but doesnt use  . Stroke ([place])   . TIA (transient ischemic attack)    not really TIA but abnormal MRI per pt left sided weakness  . UTI (lower urinary tract infection)   . Vitamin D deficiency     Past Surgical History:  Procedure Laterality Date  . BACK SURGERY    . Battery change     DBS, 12/2009  . BIOPSY N/A 11/29/2013   Procedure: GASTRIC BIOPSY;  Surgeon: Danie Binder, MD;  Location: [facility];  Service: Endoscopy;  Laterality: N/A;  . BURR HOLE W/ STEREOTACTIC INSERTION OF DBS LEADS /  INTRAOP MICROELECTRODE RECORDING     2007  . CARPAL TUNNEL RELEASE Right   . COLONOSCOPY WITH PROPOFOL N/A 11/29/2013   Procedure: COLONOSCOPY WITH PROPOFOL;  Surgeon: Danie Binder, MD;  Location: [facility];  Service: Endoscopy;  Laterality: N/A;  entered cecum @ [phone]; total cecal withdrawal time 21 minutes   . ELBOW SURGERY Left    after fx  . ESOPHAGEAL DILATION N/A 11/29/2013   Procedure: ESOPHAGEAL DILATION;  Surgeon: Danie Binder, MD;  [facility]: [facility];  Service: Endoscopy;  Laterality: N/A;  [facility] 14/15/16  . ESOPHAGOGASTRODUODENOSCOPY (EGD) WITH PROPOFOL N/A 11/29/2013   Procedure: ESOPHAGOGASTRODUODENOSCOPY (EGD) WITH PROPOFOL;  Surgeon: Danie Binder, MD;  [facility]: [facility];  Service: Endoscopy;  Laterality: N/A;  . FOOT SURGERY Left    "something with 2nd 2."  . NECK SURGERY     Fusion  . POLYPECTOMY N/A 11/29/2013   Procedure: POLYPECTOMY;  Surgeon: Danie Binder, MD;  [facility]: [facility];  Service: Endoscopy;  Laterality: N/A;  Distal Transverse Colon x2   . PR ANALYZE NEUR[employer] BRAIN, FIRST 1H  10/15/2012      . PULSE GENERATOR IMPLANT Right 03/30/2018   Procedure: Right Implantable pulse  generator change;  Surgeon: Erline Levine, MD;  Location: [facility];  Service: Neurosurgery;  Laterality: Right;  . SUBTHALAMIC STIMULATOR BATTERY REPLACEMENT N/A 08/05/2013   Procedure: SUBTHALAMIC STIMULATOR BATTERY REPLACEMENT;  Surgeon: Erline Levine, MD;  Location: [facility];  Service: Neurosurgery;  Laterality: N/A;  . SUBTHALAMIC STIMULATOR BATTERY REPLACEMENT N/A 11/26/2015   Procedure: Implantable pulse generator battery change for deep brain stimulator;  Surgeon: Erline Levine, MD;  Location: [facility];  Service: Neurosurgery;  Laterality: N/A;  . SUBTHALAMIC STIMULATOR BATTERY REPLACEMENT Right 09/13/2020   Procedure: Right chest implantable pulse generator change for depleted battery;  Surgeon: Erline Levine, MD;  Location: [facility];  Service: Neurosurgery;  Laterality: Right;  3C/RM 18  .  VAGINAL HYSTERECTOMY     "partial"  . WRIST SURGERY Right    after fx    Social History   Socioeconomic History  . Marital status: Widowed    Spouse name: Not on file  . Number of children: Not on file  . Years of education: Not on file  . Highest education level: Not on file  Occupational History  . Occupation: Retired  Tobacco Use  . Smoking status: Former Smoker    Packs/day: 0.50    Years: 25.00    Pack years: 12.50    Types: Cigarettes    Quit date: 03/01/2019    Years since quitting: 1.5  . Smokeless tobacco: Never Used  Vaping Use  . Vaping Use: Never used  Substance and Sexual Activity  . Alcohol use: Not Currently  . Drug use: No  . Sexual activity: Not on file  Other Topics Concern  . Not on file  Social History Narrative   She resides at [facility] (assisted living facility).   Social Determinants of Health   Financial Resource Strain: Not on file  Food Insecurity: Not on file  Transportation Needs: Not on file  Physical Activity: Not on file  Stress: Not on file  Social Connections: Not on file  Intimate Partner Violence: Not on file   Family History  Problem Relation Age of Onset  . Colon cancer Paternal Uncle       VITAL SIGNS BP 130/60   Pulse 70   Temp 98.5 F (36.9 C)   Resp 20   Ht 5' 2"  (1.575 m)   Wt 170 lb (77.1 kg)   SpO2 95%   BMI 31.09 kg/m   Outpatient Encounter Medications as of 09/24/2020  Medication Sig  . acetaminophen (TYLENOL) 500 MG tablet Take 500 mg by mouth every 6 (six) hours as needed for mild pain or moderate pain.  Marland Kitchen albuterol (VENTOLIN HFA) 108 (90 Base) MCG/ACT inhaler Inhale 2 puffs into the lungs every 4 (four) hours as needed for wheezing or shortness of breath.  . ALPRAZolam (XANAX) 0.5 MG tablet Take 1 tablet (0.5 mg total) by mouth 2 (two) times daily.  Marland Kitchen alum & mag hydroxide-simeth (MAALOX PLUS) 400-400-40 MG/5ML suspension Take 20 mLs by mouth 2 (two) times daily as needed for indigestion.  Marland Kitchen  amLODipine (NORVASC) 10 MG tablet Take 10 mg by mouth daily.   Marland Kitchen atorvastatin (LIPITOR) 40 MG tablet Take 1 tablet (40 mg total) by mouth daily.  Marland Kitchen BREZTRI AEROSPHERE 160-9-4.8 MCG/ACT AERO Inhale 2 puffs into the lungs 2 (two) times daily.  . carbidopa-levodopa (SINEMET IR) 25-100 MG tablet Take 1 tablet by mouth daily in the afternoon.  . Cholecalciferol (VITAMIN D) 50 MCG (2000 UT) tablet Take 2,000 Units by mouth daily.  Marland Kitchen  docusate sodium (COLACE) 100 MG capsule Take 100 mg by mouth every other day.  . DULoxetine (CYMBALTA) 60 MG capsule Take 60 mg by mouth daily.  Marland Kitchen guaiFENesin (MUCINEX) 600 MG 12 hr tablet Take 600 mg by mouth daily as needed.  Marland Kitchen losartan (COZAAR) 100 MG tablet Take 100 mg by mouth daily.  Marland Kitchen omeprazole (PRILOSEC) 20 MG capsule Take 20 mg by mouth in the morning and at bedtime.   Marland Kitchen oxybutynin (DITROPAN) 5 MG tablet Take 5 mg by mouth 2 (two) times daily.  . polyethylene glycol (MIRALAX / GLYCOLAX) 17 g packet Take 17 g by mouth daily as needed.  . pramipexole (MIRAPEX) 1 MG tablet Take 1.5 mg by mouth at bedtime.   . traZODone (DESYREL) 150 MG tablet Take 150 mg by mouth at bedtime.   Marland Kitchen UNABLE TO FIND Diet Change: Dys 3 (chopped) diet, continue thin liquids; will allow pizza if requested  Diet - Liquids: _x __Regular; ___Thickened ___ Consistency: ___ Nectar, ___Honey, ___ Pudding; ____ Fluid Restriction   No facility-administered encounter medications on file as of 09/24/2020.     SIGNIFICANT DIAGNOSTIC EXAMS   LABS REVIEWED PREVIOUS  05-10-20: wbc 6.2; hgb 14.8; hct 44.7; mcv 94.9 plt 251; glucose 122; bun 14; creat 0.67; k+ 3.5; na++ 140 ca 8.9 GFR>60; alk phos 146; albumin 3.9  07-27-20: wbc 6.9; hgb 13.8; hct 41.9; mcv 95.2 plt 221; glucose 111; bun 19; creat 0.82; k+ 3.4; na++ 139; ca 8.9; GFR>60; liver normal albumin 3.6; chol 110; ldl 45; trig 107 hdl 44 07-30-20: k+ 3.6   NO NEW LABS.   Review of Systems  Constitutional: Negative for malaise/fatigue.   Respiratory: Negative for cough and shortness of breath.   Cardiovascular: Negative for chest pain, palpitations and leg swelling.  Gastrointestinal: Negative for abdominal pain, constipation and heartburn.  Musculoskeletal: Negative for back pain, joint pain and myalgias.  Skin: Negative.   Neurological: Negative for dizziness.  Psychiatric/Behavioral: The patient is not nervous/anxious.       Physical Exam Constitutional:      General: She is not in acute distress.    Appearance: She is well-developed. She is not diaphoretic.  Neck:     Thyroid: No thyromegaly.  Cardiovascular:     Rate and Rhythm: Normal rate and regular rhythm.     Pulses: Normal pulses.     Heart sounds: Murmur heard.    Pulmonary:     Effort: Pulmonary effort is normal. No respiratory distress.     Breath sounds: Normal breath sounds.  Abdominal:     General: Bowel sounds are normal. There is no distension.     Palpations: Abdomen is soft.     Tenderness: There is no abdominal tenderness.  Musculoskeletal:     Cervical back: Neck supple.     Right lower leg: No edema.     Left lower leg: No edema.     Comments: Is able to move all extremities Has some left side weakness present.     Lymphadenopathy:     Cervical: No cervical adenopathy.  Skin:    General: Skin is warm and dry.  Neurological:     Mental Status: She is alert. Mental status is at baseline.  Psychiatric:        Mood and Affect: Mood normal.      ASSESSMENT/ PLAN:  TODAY  1. Mixed hyperlipidemia: is stable LDL 44; will continue lipitor 40 mg daily   2. Major depression recurrent chronic: is stable will  continue cymbalta 60 mg twice daily has xanax 0.5 mg twice daily (failed prn dosing) trazodone 150 mg daily   3. COPD without exacerbation: is stable will continue breztri aerosphere 160-9-4.8 mcg 2 puffs twice daily has albuterol 2 puffs every 4 hours as needed; mucinex daily as needed  4. GERD without esophagitis: is stable  will continue prilosec 20 mg twice daily   PREVIOUS   5. Chronic constipation: is stable will continue colace 100 mg every other day and miralax daily as needed   6. Chronic pain syndrome: is stable  will continue cymbalta 60 mg twice daily also takes for mood state.   7. Parkinson's dementia: is without change weight is 182 pounds; will monitor   8. Essential hypertension: is stable b/p 130/60 will continue norvasc 10 mg daily cozaar 100 mg daily   9. Parkinson's disease is status post deep brain stimulator: is without change will continue sinemet cr 25/100 mg daily mirapex 1.5 mg nightly will monitor  10. OAB (overactive bladder) is stable will continue ditropan 5 mg twice daily    Ok Edwards NP [facility]  Contact [phone] Monday through Friday 8am- 5pm  After hours call [phone]

## 2020-10-02 ENCOUNTER — Other Ambulatory Visit: Payer: Self-pay | Admitting: Adult Health

## 2020-10-02 MED ORDER — ALPRAZOLAM 0.5 MG PO TABS: 0.5000 mg | ORAL_TABLET | Freq: Two times a day (BID) | ORAL | 0 refills | Status: DC

## 2020-10-10 ENCOUNTER — Non-Acute Institutional Stay (SKILLED_NURSING_FACILITY): Payer: Medicare Other | Admitting: Adult Health

## 2020-10-10 ENCOUNTER — Encounter: Payer: Self-pay | Admitting: Adult Health

## 2020-10-10 DIAGNOSIS — F0281 Dementia in other diseases classified elsewhere with behavioral disturbance: Secondary | ICD-10-CM | POA: Diagnosis not present

## 2020-10-10 DIAGNOSIS — F339 Major depressive disorder, recurrent, unspecified: Secondary | ICD-10-CM

## 2020-10-10 DIAGNOSIS — G20 Parkinson's disease: Secondary | ICD-10-CM

## 2020-10-10 DIAGNOSIS — F02818 Dementia in other diseases classified elsewhere, unspecified severity, with other behavioral disturbance: Secondary | ICD-10-CM

## 2020-10-10 NOTE — Progress Notes (Signed)
Location:  [facility] Room Number: 103 Place of Service:  SNF (31)   CODE STATUS: full code   Allergies  Allergen Reactions  . Oxycodone Other (See Comments)    Can tolerate low dose mixed with tynlelol HALLUCINATIONS   . Docosahexaenoic Acid   . Eicosapentaenoic Acid (Epa)   . Lutein   . Omega-3 Fatty Acids   . Other   . Aspirin Nausea And Vomiting and Other (See Comments)    Severe stomach pain   . Codeine Nausea Only    Makes her feel very sick     Chief Complaint  Patient presents with  . Acute Visit    Mood state     HPI:  She is having emotional disturbances. She is telling her family false statements about her care; and how she is treated by staff. She feels as though people are talking about her. She tells me that she is anxious; nervous. She denies feeling paranoid. She is presently taking cymbalta 60 mg daily; xanax 0.5 mg twice daily; trazodone 150 mg nightly.  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 Her SLUMS 18/30 BCAT 32/50; which also demonstrate dementia.    Past Medical History:  Diagnosis Date  . Anxiety   . Arthritis   . Complication of anesthesia   . Depression   . Dysphasia   . GERD (gastroesophageal reflux disease)   . Hyperlipidemia   . Hypertension   . Parkinson's disease ([place])    diagnosed at age 46  . 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 PONV (postoperative nausea and vomiting)    states it has been a long time since getting sick  . Sleep apnea    Has CPAP but doesnt use  . Stroke ([place])   . TIA (transient ischemic attack)    not really TIA but abnormal MRI per pt left sided weakness  . UTI (lower urinary tract infection)   . Vitamin D deficiency     Past Surgical History:  Procedure Laterality Date  . BACK SURGERY    . Battery change     DBS, 12/2009  . BIOPSY N/A 11/29/2013   Procedure: GASTRIC BIOPSY;  Surgeon: Courtney Binder,  MD;  [facility];  Service: Endoscopy;  Laterality: N/A;  . BURR HOLE W/ STEREOTACTIC INSERTION OF DBS LEADS / INTRAOP MICROELECTRODE RECORDING     2007  . CARPAL TUNNEL RELEASE Right   . COLONOSCOPY WITH PROPOFOL N/A 11/29/2013   Procedure: COLONOSCOPY WITH PROPOFOL;  Surgeon: Courtney Binder, MD;  Location: [facility];  Service: Endoscopy;  Laterality: N/A;  entered cecum @ [phone]; total cecal withdrawal time 21 minutes   . ELBOW SURGERY Left    after fx  . ESOPHAGEAL DILATION N/A 11/29/2013   Procedure: ESOPHAGEAL DILATION;  Surgeon: Courtney Binder, MD;  Location: [facility];  Service: Endoscopy;  Laterality: N/A;  [facility] 14/15/16  . ESOPHAGOGASTRODUODENOSCOPY (EGD) WITH PROPOFOL N/A 11/29/2013   Procedure: ESOPHAGOGASTRODUODENOSCOPY (EGD) WITH PROPOFOL;  Surgeon: Courtney Binder, MD;  Location: [facility];  Service: Endoscopy;  Laterality: N/A;  . FOOT SURGERY Left    "something with 2nd 2."  . NECK SURGERY     Fusion  . POLYPECTOMY N/A 11/29/2013   Procedure: POLYPECTOMY;  Surgeon: Courtney Binder, MD;  Location: [facility];  Service: Endoscopy;  Laterality: N/A;  Distal Transverse Colon x2   . PR ANALYZE [employer], FIRST 1H  10/15/2012      . PULSE GENERATOR IMPLANT Right 03/30/2018   Procedure: Right Implantable pulse generator change;  Surgeon: Courtney Levine, MD;  Location: [place];  Service: Neurosurgery;  Laterality: Right;  . SUBTHALAMIC STIMULATOR BATTERY REPLACEMENT N/A 08/05/2013   Procedure: SUBTHALAMIC STIMULATOR BATTERY REPLACEMENT;  Surgeon: Courtney Levine, MD;  Location: [facility];  Service: Neurosurgery;  Laterality: N/A;  . SUBTHALAMIC STIMULATOR BATTERY REPLACEMENT N/A 11/26/2015   Procedure: Implantable pulse generator battery change for deep brain stimulator;  Surgeon: Courtney Levine, MD;  Location: [place] [facility];  Service: Neurosurgery;  Laterality: N/A;  . SUBTHALAMIC STIMULATOR BATTERY REPLACEMENT Right 09/13/2020   Procedure: Right chest implantable pulse generator change for depleted  battery;  Surgeon: Courtney Levine, MD;  Location: [facility];  Service: Neurosurgery;  Laterality: Right;  3C/RM 18  . VAGINAL HYSTERECTOMY     "partial"  . WRIST SURGERY Right    after fx    Social History   Socioeconomic History  . Marital status: Widowed    Spouse name: Not on file  . Number of children: Not on file  . Years of education: Not on file  . Highest education level: Not on file  Occupational History  . Occupation: Retired  Tobacco Use  . Smoking status: Former Smoker    Packs/day: 0.50    Years: 25.00    Pack years: 12.50    Types: Cigarettes    Quit date: 03/01/2019    Years since quitting: 1.6  . Smokeless tobacco: Never Used  Vaping Use  . Vaping Use: Never used  Substance and Sexual Activity  . Alcohol use: Not Currently  . Drug use: No  . Sexual activity: Not on file  Other Topics Concern  . Not on file  Social History Narrative   She resides at [facility] (assisted living facility).   Social Determinants of Health   Financial Resource Strain: Not on file  Food Insecurity: Not on file  Transportation Needs: Not on file  Physical Activity: Not on file  Stress: Not on file  Social Connections: Not on file  Intimate Partner Violence: Not on file   Family History  Problem Relation Age of Onset  . Colon cancer Paternal Uncle       VITAL SIGNS BP 134/72   Pulse 76   Temp 98.6 F (37 C)   Ht 5' 2"  (1.575 m)   Wt 174 lb 3.2 oz (79 kg)   BMI 31.86 kg/m   Outpatient Encounter Medications as of 10/10/2020  Medication Sig  . acetaminophen (TYLENOL) 500 MG tablet Take 500 mg by mouth every 6 (six) hours as needed for mild pain or moderate pain.  Marland Kitchen albuterol (VENTOLIN HFA) 108 (90 Base) MCG/ACT inhaler Inhale 2 puffs into the lungs every 4 (four) hours as needed for wheezing or shortness of breath.  . ALPRAZolam (XANAX) 0.5 MG tablet Take 1 tablet (0.5 mg total) by mouth 2 (two) times daily.  Marland Kitchen alum & mag hydroxide-simeth (MAALOX PLUS) 400-400-40  MG/5ML suspension Take 20 mLs by mouth 2 (two) times daily as needed for indigestion.  Marland Kitchen amLODipine (NORVASC) 10 MG tablet Take 10 mg by mouth daily.   Marland Kitchen atorvastatin (LIPITOR) 40 MG tablet Take 1 tablet (40 mg total) by mouth daily.  Marland Kitchen BREZTRI AEROSPHERE 160-9-4.8 MCG/ACT AERO Inhale 2 puffs into the lungs 2 (two) times daily.  . carbidopa-levodopa (SINEMET IR) 25-100 MG tablet Take 1 tablet by mouth daily in the afternoon.  . Cholecalciferol (VITAMIN  D) 50 MCG (2000 UT) tablet Take 2,000 Units by mouth daily.  Marland Kitchen docusate sodium (COLACE) 100 MG capsule Take 100 mg by mouth every other day.  . DULoxetine (CYMBALTA) 60 MG capsule Take 60 mg by mouth daily.  Marland Kitchen guaiFENesin (MUCINEX) 600 MG 12 hr tablet Take 600 mg by mouth daily as needed.  Marland Kitchen losartan (COZAAR) 100 MG tablet Take 100 mg by mouth daily.  Marland Kitchen omeprazole (PRILOSEC) 20 MG capsule Take 20 mg by mouth in the morning and at bedtime.   Marland Kitchen oxybutynin (DITROPAN) 5 MG tablet Take 5 mg by mouth 2 (two) times daily.  . polyethylene glycol (MIRALAX / GLYCOLAX) 17 g packet Take 17 g by mouth daily as needed.  . pramipexole (MIRAPEX) 1 MG tablet Take 1.5 mg by mouth at bedtime.   . traZODone (DESYREL) 150 MG tablet Take 150 mg by mouth at bedtime.   Marland Kitchen UNABLE TO FIND Diet Change: Dys 3 (chopped) diet, continue thin liquids; will allow pizza if requested  Diet - Liquids: _x __Regular; ___Thickened ___ Consistency: ___ Nectar, ___Honey, ___ Pudding; ____ Fluid Restriction   No facility-administered encounter medications on file as of 10/10/2020.     SIGNIFICANT DIAGNOSTIC EXAMS   LABS REVIEWED PREVIOUS  05-10-20: wbc 6.2; hgb 14.8; hct 44.7; mcv 94.9 plt 251; glucose 122; bun 14; creat 0.67; k+ 3.5; na++ 140 ca 8.9 GFR>60; alk phos 146; albumin 3.9  07-27-20: wbc 6.9; hgb 13.8; hct 41.9; mcv 95.2 plt 221; glucose 111; bun 19; creat 0.82; k+ 3.4; na++ 139; ca 8.9; GFR>60; liver normal albumin 3.6; chol 110; ldl 45; trig 107 hdl 44 07-30-20: k+ 3.6    NO NEW LABS.   Review of Systems  Constitutional: Negative for malaise/fatigue.  Respiratory: Negative for cough and shortness of breath.   Cardiovascular: Negative for chest pain, palpitations and leg swelling.  Gastrointestinal: Negative for abdominal pain, constipation and heartburn.  Musculoskeletal: Negative for back pain, joint pain and myalgias.  Skin: Negative.   Neurological: Negative for dizziness.  Psychiatric/Behavioral: Negative for hallucinations and suicidal ideas. The patient is nervous/anxious and has insomnia.     Physical Exam Constitutional:      General: She is not in acute distress.    Appearance: She is well-developed and overweight. She is not diaphoretic.  Neck:     Thyroid: No thyromegaly.  Cardiovascular:     Rate and Rhythm: Normal rate and regular rhythm.     Pulses: Normal pulses.     Heart sounds: Murmur heard.    Pulmonary:     Effort: Pulmonary effort is normal. No respiratory distress.     Breath sounds: Normal breath sounds.  Abdominal:     General: Bowel sounds are normal. There is no distension.     Palpations: Abdomen is soft.     Tenderness: There is no abdominal tenderness.  Musculoskeletal:     Cervical back: Neck supple.     Right lower leg: No edema.     Left lower leg: No edema.     Comments: Has left side weakness Leans head to the left  Lymphadenopathy:     Cervical: No cervical adenopathy.  Skin:    General: Skin is warm and dry.  Neurological:     Mental Status: She is alert. Mental status is at baseline.     Comments: 10-09-20: BCAT 32/50 SLUMS 18/30  Psychiatric:        Mood and Affect: Mood normal.       ASSESSMENT/  PLAN:  TODAY  1. Parkinson's disease dementia 2. Major depression recurrent chronic  Is worse Will begin buspar 7.5 mg twice daily  Will begin aricept 5 mg nightly  Will monitor her status.    Ok Edwards NP [facility]  Contact [phone] Monday through Friday 8am- 5pm   After hours call [phone]

## 2020-10-24 ENCOUNTER — Encounter: Payer: Self-pay | Admitting: Adult Health

## 2020-10-24 ENCOUNTER — Non-Acute Institutional Stay (SKILLED_NURSING_FACILITY): Payer: Medicare Other | Admitting: Adult Health

## 2020-10-24 DIAGNOSIS — K5909 Other constipation: Secondary | ICD-10-CM | POA: Diagnosis not present

## 2020-10-24 DIAGNOSIS — G894 Chronic pain syndrome: Secondary | ICD-10-CM

## 2020-10-24 DIAGNOSIS — I10 Essential (primary) hypertension: Secondary | ICD-10-CM

## 2020-10-24 DIAGNOSIS — F02818 Dementia in other diseases classified elsewhere, unspecified severity, with other behavioral disturbance: Secondary | ICD-10-CM

## 2020-10-24 DIAGNOSIS — G20 Parkinson's disease: Secondary | ICD-10-CM

## 2020-10-24 DIAGNOSIS — F0281 Dementia in other diseases classified elsewhere with behavioral disturbance: Secondary | ICD-10-CM

## 2020-10-24 NOTE — Progress Notes (Signed)
Location:  [facility] Room Number: 108-D Place of Service:  SNF (31)   CODE STATUS: Full Code   Allergies  Allergen Reactions  . Oxycodone Other (See Comments)    Can tolerate low dose mixed with tynlelol HALLUCINATIONS   . Docosahexaenoic Acid   . Eicosapentaenoic Acid (Epa)   . Lutein   . Omega-3 Fatty Acids   . Other   . Aspirin Nausea And Vomiting and Other (See Comments)    Severe stomach pain   . Codeine Nausea Only    Makes her feel very sick     Chief Complaint  Patient presents with  . Medical Management of Chronic Issues          Chronic constipation:   Chronic pain syndrome:   Parkinson's disease dementia:  Essential hypertension    HPI:  She is a 75 year old long term resident of this facility being seen for the management of her chronic illnesses:Chronic constipation:   Chronic pain syndrome:   Parkinson's disease dementia:  Essential hypertension. There are no reports of uncontrolled pain; no reports of anxiety or agitation; no reports of insomnia.    Past Medical History:  Diagnosis Date  . Anxiety   . Arthritis   . Complication of anesthesia   . Depression   . Dysphasia   . GERD (gastroesophageal reflux disease)   . Hyperlipidemia   . Hypertension   . Parkinson's disease ([place])    diagnosed at age 85  . Parkinson's disease dementia ([place]) 07/31/2020  . PONV (postoperative nausea and vomiting)    states it has been a long time since getting sick  . Sleep apnea    Has CPAP but doesnt use  . Stroke ([place])   . TIA (transient ischemic attack)    not really TIA but abnormal MRI per pt left sided weakness  . UTI (lower urinary tract infection)   . Vitamin D deficiency     Past Surgical History:  Procedure Laterality Date  . BACK SURGERY    . Battery change     DBS, 12/2009  . BIOPSY N/A 11/29/2013   Procedure: GASTRIC BIOPSY;  Surgeon: Danie Binder, MD;  Location: [facility];  Service: Endoscopy;  Laterality: N/A;  . BURR HOLE W/  STEREOTACTIC INSERTION OF DBS LEADS / INTRAOP MICROELECTRODE RECORDING     2007  . CARPAL TUNNEL RELEASE Right   . COLONOSCOPY WITH PROPOFOL N/A 11/29/2013   Procedure: COLONOSCOPY WITH PROPOFOL;  Surgeon: Danie Binder, MD;  Location: [facility];  Service: Endoscopy;  Laterality: N/A;  entered cecum @ [phone]; total cecal withdrawal time 21 minutes   . ELBOW SURGERY Left    after fx  . ESOPHAGEAL DILATION N/A 11/29/2013   Procedure: ESOPHAGEAL DILATION;  Surgeon: Danie Binder, MD;  Location: [facility];  Service: Endoscopy;  Laterality: N/A;  [facility] 14/15/16  . ESOPHAGOGASTRODUODENOSCOPY (EGD) WITH PROPOFOL N/A 11/29/2013   Procedure: ESOPHAGOGASTRODUODENOSCOPY (EGD) WITH PROPOFOL;  Surgeon: Danie Binder, MD;  [facility]: [facility];  Service: Endoscopy;  Laterality: N/A;  . FOOT SURGERY Left    "something with 2nd 2."  . NECK SURGERY     Fusion  . POLYPECTOMY N/A 11/29/2013   Procedure: POLYPECTOMY;  Surgeon: Danie Binder, MD;  Location: [facility];  Service: Endoscopy;  Laterality: N/A;  Distal Transverse Colon x2   . PR ANALYZE NEUROSTIM BRAIN, FIRST 1H  10/15/2012      . PULSE GENERATOR IMPLANT Right 03/30/2018  Procedure: Right Implantable pulse generator change;  Surgeon: Erline Levine, MD;  Location: [facility];  Service: Neurosurgery;  Laterality: Right;  . SUBTHALAMIC STIMULATOR BATTERY REPLACEMENT N/A 08/05/2013   Procedure: SUBTHALAMIC STIMULATOR BATTERY REPLACEMENT;  Surgeon: Erline Levine, MD;  Location: [facility];  Service: Neurosurgery;  Laterality: N/A;  . SUBTHALAMIC STIMULATOR BATTERY REPLACEMENT N/A 11/26/2015   Procedure: Implantable pulse generator battery change for deep brain stimulator;  Surgeon: Erline Levine, MD;  Location: [facility];  Service: Neurosurgery;  Laterality: N/A;  . SUBTHALAMIC STIMULATOR BATTERY REPLACEMENT Right 09/13/2020   Procedure: Right chest implantable pulse generator change for depleted battery;  Surgeon: Erline Levine, MD;  Location: [facility];  Service: Neurosurgery;   Laterality: Right;  3C/RM 18  . VAGINAL HYSTERECTOMY     "partial"  . WRIST SURGERY Right    after fx    Social History   Socioeconomic History  . Marital status: Widowed    Spouse name: Not on file  . Number of children: Not on file  . Years of education: Not on file  . Highest education level: Not on file  Occupational History  . Occupation: Retired  Tobacco Use  . Smoking status: Former Smoker    Packs/day: 0.50    Years: 25.00    Pack years: 12.50    Types: Cigarettes    Quit date: 03/01/2019    Years since quitting: 1.6  . Smokeless tobacco: Never Used  Vaping Use  . Vaping Use: Never used  Substance and Sexual Activity  . Alcohol use: Not Currently  . Drug use: No  . Sexual activity: Not on file  Other Topics Concern  . Not on file  Social History Narrative   She resides at [facility] (assisted living facility).   Social Determinants of Health   Financial Resource Strain: Not on file  Food Insecurity: Not on file  Transportation Needs: Not on file  Physical Activity: Not on file  Stress: Not on file  Social Connections: Not on file  Intimate Partner Violence: Not on file   Family History  Problem Relation Age of Onset  . Colon cancer Paternal Uncle       VITAL SIGNS BP 138/64   Pulse 60   Temp 98.1 F (36.7 C)   Resp 18   Ht _0  (1.575 m)   Wt 174 lb 3.2 oz (79 kg)   SpO2 97%   BMI 31.86 kg/m   Outpatient Encounter Medications as of 10/24/2020  Medication Sig  . acetaminophen (TYLENOL) 500 MG tablet Take 500 mg by mouth every 6 (six) hours as needed for mild pain or moderate pain.  Marland Kitchen albuterol (VENTOLIN HFA) 108 (90 Base) MCG/ACT inhaler Inhale 2 puffs into the lungs every 4 (four) hours as needed for wheezing or shortness of breath.  . ALPRAZolam (XANAX) 0.5 MG tablet Take 1 tablet (0.5 mg total) by mouth 2 (two) times daily.  Marland Kitchen alum & mag hydroxide-simeth (MAALOX PLUS) 400-400-40 MG/5ML suspension Take 20 mLs by mouth 2 (two) times daily  as needed for indigestion.  Marland Kitchen amLODipine (NORVASC) 10 MG tablet Take 10 mg by mouth daily.   Marland Kitchen atorvastatin (LIPITOR) 40 MG tablet Take 1 tablet (40 mg total) by mouth daily.  Marland Kitchen BREZTRI AEROSPHERE 160-9-4.8 MCG/ACT AERO Inhale 2 puffs into the lungs 2 (two) times daily.  . busPIRone (BUSPAR) 7.5 MG tablet Take 7.5 mg by mouth 2 (two) times daily. For major depressive disorder, recurrent  . carbidopa-levodopa (SINEMET IR) 25-100  MG tablet Take 1 tablet by mouth 2 (two) times daily.  . Cholecalciferol (VITAMIN D) 50 MCG (2000 UT) tablet Take 2,000 Units by mouth daily.  . clopidogrel (PLAVIX) 75 MG tablet Take 75 mg by mouth daily.  Marland Kitchen docusate sodium (COLACE) 100 MG capsule Take 100 mg by mouth daily.  Marland Kitchen donepezil (ARICEPT) 5 MG tablet Take 5 mg by mouth at bedtime. 8 pm for parkinson's dementia  . DULoxetine (CYMBALTA) 60 MG capsule Take 60 mg by mouth daily.  Marland Kitchen guaiFENesin (MUCINEX) 600 MG 12 hr tablet Take 600 mg by mouth daily as needed.  Marland Kitchen losartan (COZAAR) 100 MG tablet Take 100 mg by mouth daily.  . miconazole (ZEASORB-AF) 2 % powder Apply 1 application topically as needed for itching (Every Shift - PRN; PRN 1, PRN 2, PRN 3).  . omeprazole (PRILOSEC) 20 MG capsule Take 20 mg by mouth in the morning and at bedtime.   Marland Kitchen oxybutynin (DITROPAN) 5 MG tablet Take 5 mg by mouth 2 (two) times daily.  . polyethylene glycol (MIRALAX / GLYCOLAX) 17 g packet Take 17 g by mouth daily as needed.  . pramipexole (MIRAPEX) 1 MG tablet Take 1 mg by mouth at bedtime.  . traZODone (DESYREL) 100 MG tablet Take 100 mg by mouth at bedtime. 9 om  . traZODone (DESYREL) 150 MG tablet Take 150 mg by mouth at bedtime.   Marland Kitchen UNABLE TO FIND Diet Change: Dys 3 (chopped) diet, continue thin liquids; will allow pizza if requested   No facility-administered encounter medications on file as of 10/24/2020.     SIGNIFICANT DIAGNOSTIC EXAMS   05-10-20: wbc 6.2; hgb 14.8; hct 44.7; mcv 94.9 plt 251; glucose 122; bun 14; creat  0.67; k+ 3.5; na++ 140 ca 8.9 GFR>60; alk phos 146; albumin 3.9  07-27-20: wbc 6.9; hgb 13.8; hct 41.9; mcv 95.2 plt 221; glucose 111; bun 19; creat 0.82; k+ 3.4; na++ 139; ca 8.9; GFR>60; liver normal albumin 3.6; chol 110; ldl 45; trig 107 hdl 44 07-30-20: k+ 3.6   NO NEW LABS.   Review of Systems  Constitutional: Negative for malaise/fatigue.  Respiratory: Negative for cough and shortness of breath.   Cardiovascular: Negative for chest pain, palpitations and leg swelling.  Gastrointestinal: Negative for abdominal pain, constipation and heartburn.  Musculoskeletal: Negative for back pain, joint pain and myalgias.  Skin: Negative.   Neurological: Negative for dizziness.  Psychiatric/Behavioral: The patient is not nervous/anxious.       Physical Exam Constitutional:      General: She is not in acute distress.    Appearance: She is well-developed. She is not diaphoretic.  Neck:     Thyroid: No thyromegaly.  Cardiovascular:     Rate and Rhythm: Normal rate and regular rhythm.     Pulses: Normal pulses.     Heart sounds: Murmur heard.      Comments: 2/6 Pulmonary:     Effort: Pulmonary effort is normal. No respiratory distress.     Breath sounds: Normal breath sounds.  Abdominal:     General: Bowel sounds are normal. There is no distension.     Palpations: Abdomen is soft.     Tenderness: There is no abdominal tenderness.  Musculoskeletal:     Cervical back: Neck supple.     Right lower leg: No edema.     Left lower leg: No edema.     Comments: Has left side weakness Leans head to the left   Lymphadenopathy:     Cervical:  No cervical adenopathy.  Skin:    General: Skin is warm and dry.  Neurological:     Mental Status: She is alert. Mental status is at baseline.     Comments: 10-09-20: BCAT 32/50 SLUMS 18/30   Psychiatric:        Mood and Affect: Mood normal.      ASSESSMENT/ PLAN:  TODAY  1. Chronic constipation: is stable will continue colace 100 mg every other  day; miralax daily as needed  2. Chronic pain syndrome: is stable will continue cymbalta 60 mg twice daily (also takes for mood state)  3. Parkinson's disease dementia: is without changes weight is 174 pounds; will continue aricept 5 mg nightly   4. Essential hypertension: is stable b/p 138/64 will continue norvasc 10 mg daily cozaar 100 mg daily    PREVIOUS   5. Parkinson's disease is status post deep brain stimulator: is without change will continue sinemet cr 25/100 mg daily mirapex 1.5 mg nightly will monitor  6. OAB (overactive bladder) is stable will continue ditropan 5 mg twice daily   7. Mixed hyperlipidemia: is stable LDL 44; will continue lipitor 40 mg daily   8. Major depression recurrent chronic: is her mood state is improving:  will continue cymbalta 60 mg twice daily has xanax 0.5 mg twice daily (failed prn dosing) trazodone 150 mg daily buspar 7.5 mg twice daily   9. COPD without exacerbation: is stable will continue breztri aerosphere 160-9-4.8 mcg 2 puffs twice daily has albuterol 2 puffs every 4 hours as needed; mucinex daily as needed  10. GERD without esophagitis: is stable will continue prilosec 20 mg twice daily      Ok Edwards NP [facility]  Contact [phone] Monday through Friday 8am- 5pm  After hours call [phone]

## 2020-11-23 ENCOUNTER — Other Ambulatory Visit: Payer: Self-pay | Admitting: Adult Health

## 2020-11-23 MED ORDER — ALPRAZOLAM 0.5 MG PO TABS: 0.5000 mg | ORAL_TABLET | Freq: Two times a day (BID) | ORAL | 0 refills | Status: DC

## 2020-12-03 ENCOUNTER — Non-Acute Institutional Stay (SKILLED_NURSING_FACILITY): Payer: Medicare Other | Admitting: Adult Health

## 2020-12-03 ENCOUNTER — Encounter: Payer: Self-pay | Admitting: Adult Health

## 2020-12-03 DIAGNOSIS — N3281 Overactive bladder: Secondary | ICD-10-CM

## 2020-12-03 DIAGNOSIS — Z9689 Presence of other specified functional implants: Secondary | ICD-10-CM | POA: Diagnosis not present

## 2020-12-03 DIAGNOSIS — E782 Mixed hyperlipidemia: Secondary | ICD-10-CM

## 2020-12-03 DIAGNOSIS — G20 Parkinson's disease: Secondary | ICD-10-CM

## 2020-12-03 DIAGNOSIS — F339 Major depressive disorder, recurrent, unspecified: Secondary | ICD-10-CM

## 2020-12-03 NOTE — Progress Notes (Signed)
Location:  [place] Room Number: 108-D Place of Service:  SNF (31)   CODE STATUS: Full  Allergies  Allergen Reactions  . Oxycodone Other (See Comments)    Can tolerate low dose mixed with tynlelol HALLUCINATIONS   . Docosahexaenoic Acid   . Eicosapentaenoic Acid (Epa)   . Lutein   . Omega-3 Fatty Acids   . Other   . Aspirin Nausea And Vomiting and Other (See Comments)    Severe stomach pain   . Codeine Nausea Only    Makes her feel very sick     Chief Complaint  Patient presents with  . Medical Management of Chronic Issues         Parkinson's disease    OAB (overactive bladder):   Mixed hyperlipidemia:  . Major depression recurrent chronic    HPI:  She is a 75 year old long term resident of this facility being seen for the management of her chronic illnesses:  Parkinson's disease    OAB (overactive bladder):   Mixed hyperlipidemia:  . Major depression recurrent chronic. There are no reports of uncontrolled pain; no reports of uncontrolled anxiety; no reports of depressive thoughts.   Past Medical History:  Diagnosis Date  . Anxiety   . Arthritis   . Complication of anesthesia   . Depression   . Dysphasia   . GERD (gastroesophageal reflux disease)   . Hyperlipidemia   . Hypertension   . Parkinson's disease ([place])    diagnosed at age 38  . Parkinson's disease dementia ([place]) 07/31/2020  . PONV (postoperative nausea and vomiting)    states it has been a long time since getting sick  . Sleep apnea    Has CPAP but doesnt use  . Stroke ([place])   . TIA (transient ischemic attack)    not really TIA but abnormal MRI per pt left sided weakness  . UTI (lower urinary tract infection)   . Vitamin D deficiency     Past Surgical History:  Procedure Laterality Date  . BACK SURGERY    . Battery change     DBS, 12/2009  . BIOPSY N/A 11/29/2013   Procedure: GASTRIC BIOPSY;  Surgeon: Danie Binder, MD;  Location: [facility];  Service: Endoscopy;  Laterality:  N/A;  . BURR HOLE W/ STEREOTACTIC INSERTION OF DBS LEADS / INTRAOP MICROELECTRODE RECORDING     2007  . CARPAL TUNNEL RELEASE Right   . COLONOSCOPY WITH PROPOFOL N/A 11/29/2013   Procedure: COLONOSCOPY WITH PROPOFOL;  Surgeon: Danie Binder, MD;  Location: [facility];  Service: Endoscopy;  Laterality: N/A;  entered cecum @ [phone]; total cecal withdrawal time 21 minutes   . ELBOW SURGERY Left    after fx  . ESOPHAGEAL DILATION N/A 11/29/2013   Procedure: ESOPHAGEAL DILATION;  Surgeon: Danie Binder, MD;  Location: [facility];  Service: Endoscopy;  Laterality: N/A;  [place] 14/15/16  . ESOPHAGOGASTRODUODENOSCOPY (EGD) WITH PROPOFOL N/A 11/29/2013   Procedure: ESOPHAGOGASTRODUODENOSCOPY (EGD) WITH PROPOFOL;  Surgeon: Danie Binder, MD;  Location: [facility];  Service: Endoscopy;  Laterality: N/A;  . FOOT SURGERY Left    "something with 2nd 2."  . NECK SURGERY     Fusion  . POLYPECTOMY N/A 11/29/2013   Procedure: POLYPECTOMY;  Surgeon: Danie Binder, MD;  Location: [facility];  Service: Endoscopy;  Laterality: N/A;  Distal Transverse Colon x2   . PR ANALYZE NEUROSTIM BRAIN, FIRST 1H  10/15/2012      . PULSE GENERATOR  IMPLANT Right 03/30/2018   Procedure: Right Implantable pulse generator change;  Surgeon: Erline Levine, MD;  Location: [place];  Service: Neurosurgery;  Laterality: Right;  . SUBTHALAMIC STIMULATOR BATTERY REPLACEMENT N/A 08/05/2013   Procedure: SUBTHALAMIC STIMULATOR BATTERY REPLACEMENT;  Surgeon: Erline Levine, MD;  Location: [place] [facility];  Service: Neurosurgery;  Laterality: N/A;  . SUBTHALAMIC STIMULATOR BATTERY REPLACEMENT N/A 11/26/2015   Procedure: Implantable pulse generator battery change for deep brain stimulator;  Surgeon: Erline Levine, MD;  Location: [place] [facility];  Service: Neurosurgery;  Laterality: N/A;  . SUBTHALAMIC STIMULATOR BATTERY REPLACEMENT Right 09/13/2020   Procedure: Right chest implantable pulse generator change for depleted battery;  Surgeon: Erline Levine, MD;  Location: [facility];   Service: Neurosurgery;  Laterality: Right;  3C/RM 18  . VAGINAL HYSTERECTOMY     "partial"  . WRIST SURGERY Right    after fx    Social History   Socioeconomic History  . Marital status: Widowed    Spouse name: Not on file  . Number of children: Not on file  . Years of education: Not on file  . Highest education level: Not on file  Occupational History  . Occupation: Retired  Tobacco Use  . Smoking status: Former    Packs/day: 0.50    Years: 25.00    Pack years: 12.50    Types: Cigarettes    Quit date: 03/01/2019    Years since quitting: 1.7  . Smokeless tobacco: Never  Vaping Use  . Vaping Use: Never used  Substance and Sexual Activity  . Alcohol use: Not Currently  . Drug use: No  . Sexual activity: Not on file  Other Topics Concern  . Not on file  Social History Narrative   She resides at [facility] (assisted living facility).   Social Determinants of Health   Financial Resource Strain: Not on file  Food Insecurity: Not on file  Transportation Needs: Not on file  Physical Activity: Not on file  Stress: Not on file  Social Connections: Not on file  Intimate Partner Violence: Not on file   Family History  Problem Relation Age of Onset  . Colon cancer Paternal Uncle       VITAL SIGNS BP 114/75   Pulse 97   Temp 97.9 F (36.6 C)   Resp 18   Ht 5' 2"  (1.575 m)   Wt 175 lb 12.8 oz (79.7 kg)   SpO2 95%   BMI 32.15 kg/m   Outpatient Encounter Medications as of 12/03/2020  Medication Sig  . acetaminophen (TYLENOL) 500 MG tablet Take 500 mg by mouth every 6 (six) hours as needed for mild pain or moderate pain.  Marland Kitchen albuterol (VENTOLIN HFA) 108 (90 Base) MCG/ACT inhaler Inhale 2 puffs into the lungs every 4 (four) hours as needed for wheezing or shortness of breath.  . ALPRAZolam (XANAX) 0.5 MG tablet Take 1 tablet (0.5 mg total) by mouth 2 (two) times daily.  Marland Kitchen alum & mag hydroxide-simeth (MAALOX PLUS) 400-400-40 MG/5ML suspension Take 20 mLs by mouth 2  (two) times daily as needed for indigestion.  Marland Kitchen amLODipine (NORVASC) 10 MG tablet Take 10 mg by mouth daily.   Marland Kitchen atorvastatin (LIPITOR) 40 MG tablet Take 1 tablet (40 mg total) by mouth daily.  Marland Kitchen BREZTRI AEROSPHERE 160-9-4.8 MCG/ACT AERO Inhale 2 puffs into the lungs 2 (two) times daily.  . busPIRone (BUSPAR) 7.5 MG tablet Take 7.5 mg by mouth 2 (two) times daily. For major depressive disorder, recurrent  . carbidopa-levodopa (  SINEMET IR) 25-100 MG tablet Take 1 tablet by mouth 2 (two) times daily.  . Cholecalciferol (VITAMIN D) 50 MCG (2000 UT) tablet Take 2,000 Units by mouth daily.  . clopidogrel (PLAVIX) 75 MG tablet Take 75 mg by mouth daily.  Marland Kitchen docusate sodium (COLACE) 100 MG capsule Take 100 mg by mouth daily.  Marland Kitchen donepezil (ARICEPT) 5 MG tablet Take 5 mg by mouth at bedtime. 8 pm for parkinson's dementia  . DULoxetine (CYMBALTA) 60 MG capsule Take 60 mg by mouth daily.  Marland Kitchen guaiFENesin (MUCINEX) 600 MG 12 hr tablet Take 600 mg by mouth daily as needed.  Marland Kitchen losartan (COZAAR) 100 MG tablet Take 100 mg by mouth daily.  . miconazole (ZEASORB-AF) 2 % powder Apply 1 application topically as needed for itching (Every Shift - PRN; PRN 1, PRN 2, PRN 3).  . omeprazole (PRILOSEC) 20 MG capsule Take 20 mg by mouth in the morning and at bedtime.   Marland Kitchen oxybutynin (DITROPAN) 5 MG tablet Take 5 mg by mouth 2 (two) times daily.  . polyethylene glycol (MIRALAX / GLYCOLAX) 17 g packet Take 17 g by mouth daily as needed.  . pramipexole (MIRAPEX) 1 MG tablet Take 1 mg by mouth at bedtime.  . traZODone (DESYREL) 100 MG tablet Take 100 mg by mouth at bedtime.  Marland Kitchen UNABLE TO FIND Diet Change: Dys 3 (chopped) diet, continue thin liquids; will allow pizza if requested  . [DISCONTINUED] traZODone (DESYREL) 150 MG tablet Take 150 mg by mouth at bedtime.    No facility-administered encounter medications on file as of 12/03/2020.     SIGNIFICANT DIAGNOSTIC EXAMS   05-10-20: wbc 6.2; hgb 14.8; hct 44.7; mcv 94.9 plt  251; glucose 122; bun 14; creat 0.67; k+ 3.5; na++ 140 ca 8.9 GFR>60; alk phos 146; albumin 3.9  07-27-20: wbc 6.9; hgb 13.8; hct 41.9; mcv 95.2 plt 221; glucose 111; bun 19; creat 0.82; k+ 3.4; na++ 139; ca 8.9; GFR>60; liver normal albumin 3.6; chol 110; ldl 45; trig 107 hdl 44 07-30-20: k+ 3.6   NO NEW LABS.   Review of Systems  Constitutional:  Negative for malaise/fatigue.  Respiratory:  Negative for cough and shortness of breath.   Cardiovascular:  Negative for chest pain, palpitations and leg swelling.  Gastrointestinal:  Negative for abdominal pain, constipation and heartburn.  Musculoskeletal:  Negative for back pain, joint pain and myalgias.  Skin: Negative.   Neurological:  Negative for dizziness.  Psychiatric/Behavioral:  The patient is not nervous/anxious.       Physical Exam Constitutional:      General: She is not in acute distress.    Appearance: She is well-developed. She is obese. She is not diaphoretic.  Neck:     Thyroid: No thyromegaly.  Cardiovascular:     Rate and Rhythm: Normal rate and regular rhythm.     Pulses: Normal pulses.     Heart sounds: Murmur heard.     Comments: 2/6 Pulmonary:     Effort: Pulmonary effort is normal. No respiratory distress.     Breath sounds: Normal breath sounds.  Abdominal:     General: Bowel sounds are normal. There is no distension.     Palpations: Abdomen is soft.     Tenderness: There is no abdominal tenderness.  Musculoskeletal:     Cervical back: Neck supple.     Right lower leg: No edema.     Left lower leg: No edema.     Comments: Has left side weakness Leans head  to the left    Lymphadenopathy:     Cervical: No cervical adenopathy.  Skin:    General: Skin is warm and dry.  Neurological:     Mental Status: She is alert. Mental status is at baseline.     Comments: 10-09-20: BCAT 32/50 SLUMS 18/30    Psychiatric:        Mood and Affect: Mood normal.    ASSESSMENT/ PLAN:  TODAY  Parkinson's disease is  status post deep brain stimulator; is without change in status will continue sinemet 25/100 mg twice daily; mirapex 1 mg nightly   2. OAB (overactive bladder): is stable will continue ditropan 5 mg twice daily   3. Mixed hyperlipidemia: is stable LDL 44 will continue lipitor 40 mg daily  4. Major depression recurrent chronic: is stable will continue cymbalta 60 mg daily xanax 0.5 mg twice daily (has failed one wean) trazodone 100 mg daily buspar 7.5 mg twice daily    PREVIOUS   5. COPD without exacerbation: is stable will continue breztri aerosphere 160-9-4.8 mcg 2 puffs twice daily has albuterol 2 puffs every 4 hours as needed; mucinex daily as needed  6. GERD without esophagitis: is stable will continue prilosec 20 mg twice daily   7. Chronic constipation: is stable will continue colace 100 mg every other day; miralax daily as needed  8. Chronic pain syndrome: is stable will continue cymbalta 60 mg  daily (also takes for mood state)  9. Parkinson's disease dementia: is without changes weight is 175 pounds; will continue aricept 5 mg nightly   10. Essential hypertension: is stable b/p 114/75 will continue norvasc 10 mg daily cozaar 100 mg daily        Ok Edwards NP [facility]  Contact [phone] Monday through Friday 8am- 5pm  After hours call [phone]

## 2020-12-06 IMAGING — CT CT HEAD W/O CM
3 series · 16 of 47 positions shown, 19 images · non-contrast
Comparison: 03/29/2019

CLINICAL DATA: Abnormal speech

EXAM:
CT HEAD WITHOUT CONTRAST
TECHNIQUE: Contiguous axial images were obtained from the base of the skull
through the vertex without intravenous contrast.

[Series 2: head w o · axial · 0.40mm/px · z∈[+1513,+1643]mm · 10 of 32 slices shown, 13 images]
[im 3/32  brain]
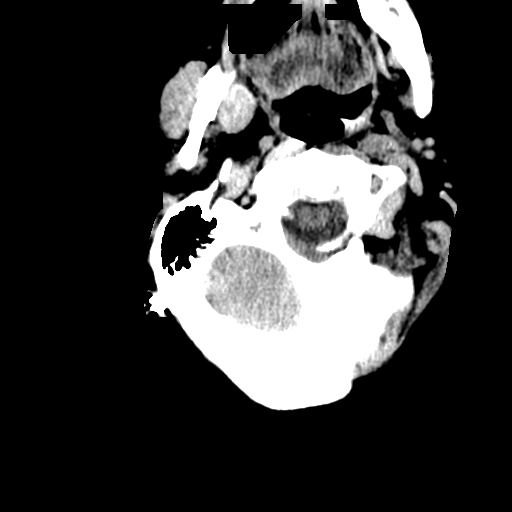
[im 3/32  bone]
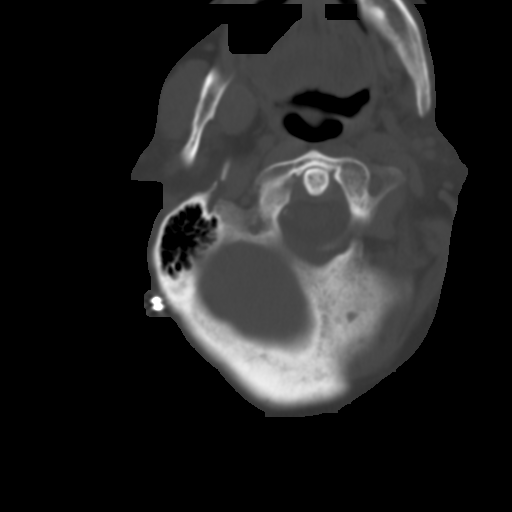
[im 6/32  brain]
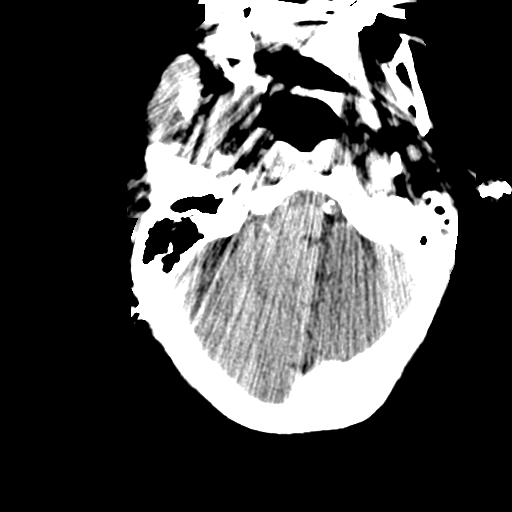
[im 9/32  brain]
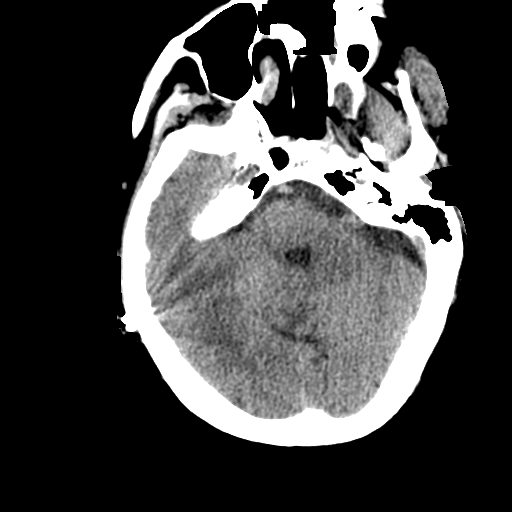
[im 11/32  brain]
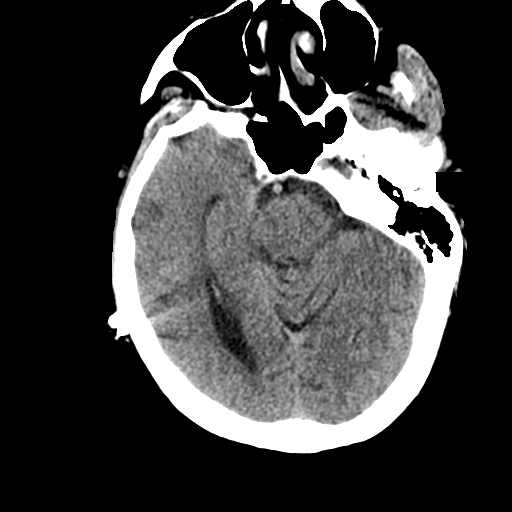
[im 14/32  brain]
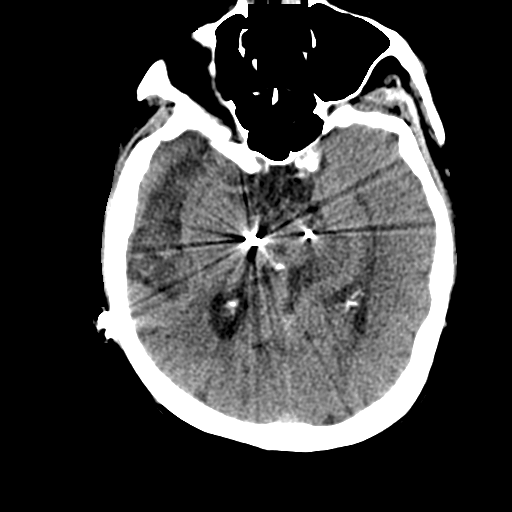
[im 14/32  bone]
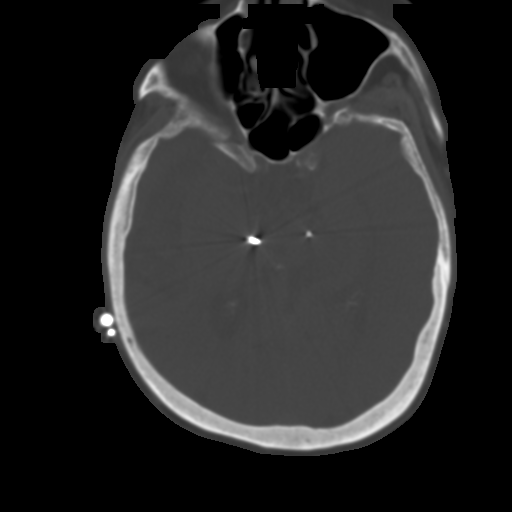
[im 18/32  brain]
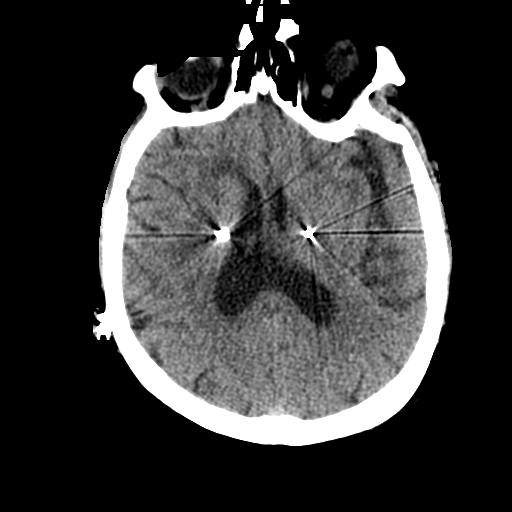
[im 21/32  brain]
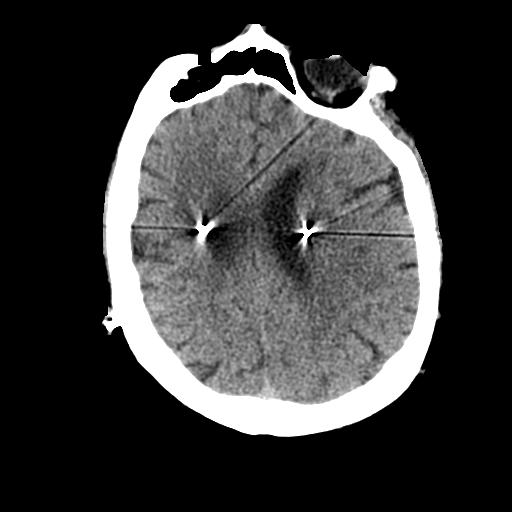
[im 24/32  brain]
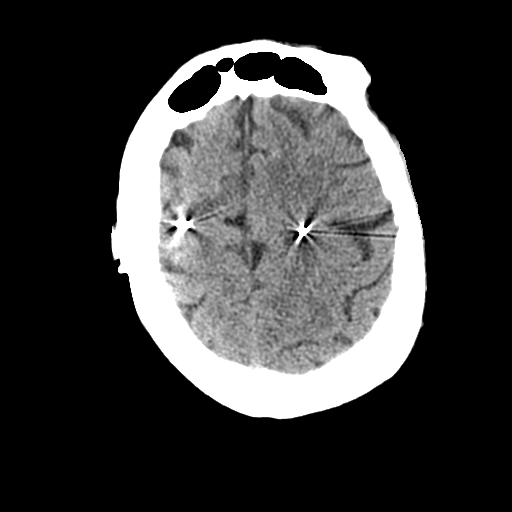
[im 26/32  brain]
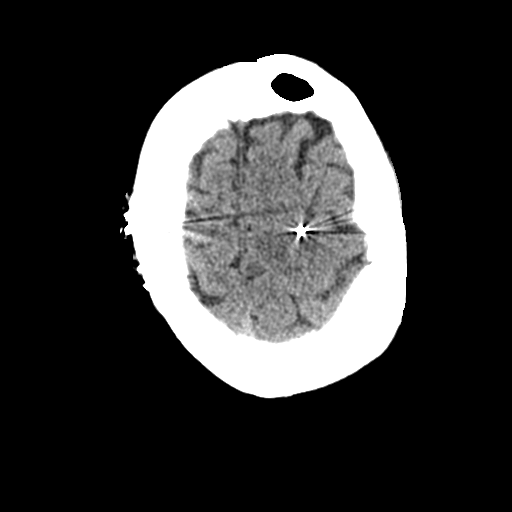
[im 26/32  bone]
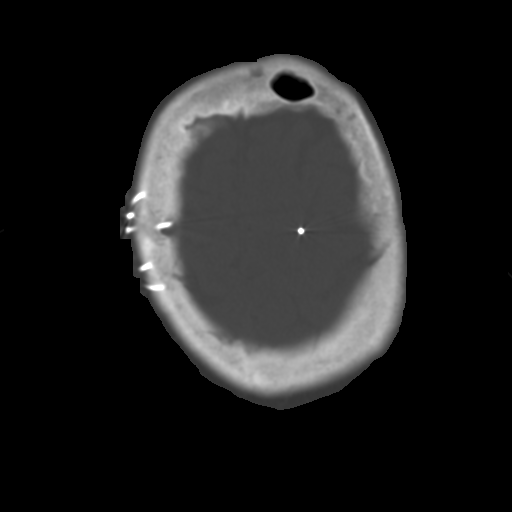
[im 29/32  brain]
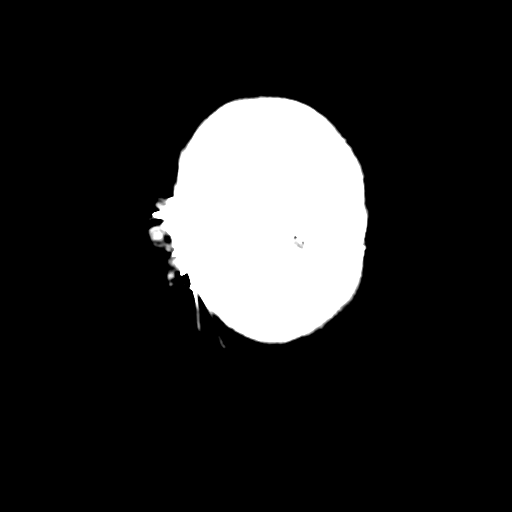

[Series 4: coronal soft · coronal · 0.33mm/px · 3 of 67 slices shown]
[im 26/67  brain]
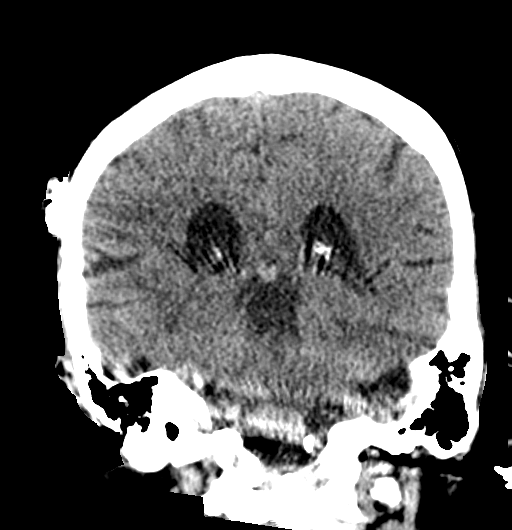
[im 31/67  brain]
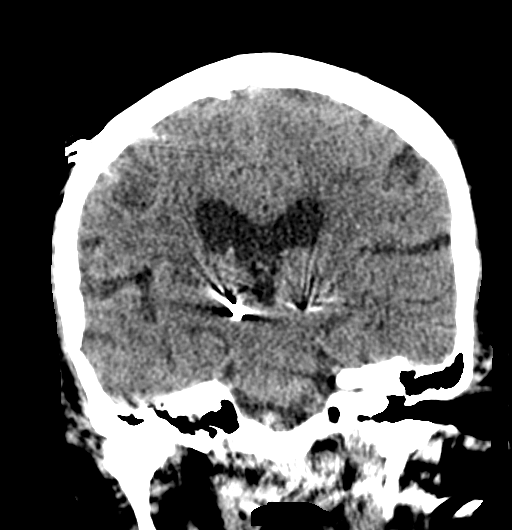
[im 36/67  brain]
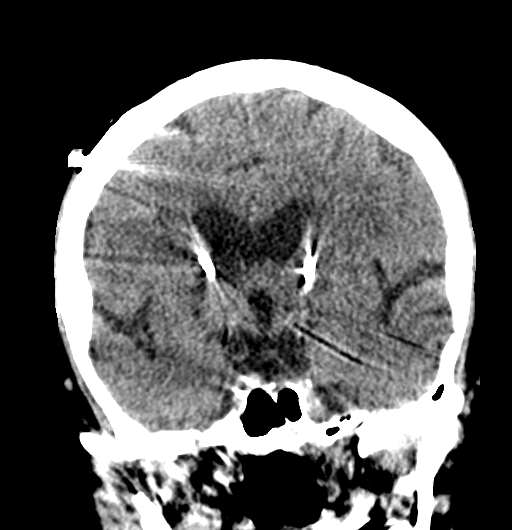

[Series 5: sagittal soft · sagittal · 0.36mm/px · 3 of 54 slices shown]
[im 21/54  brain]
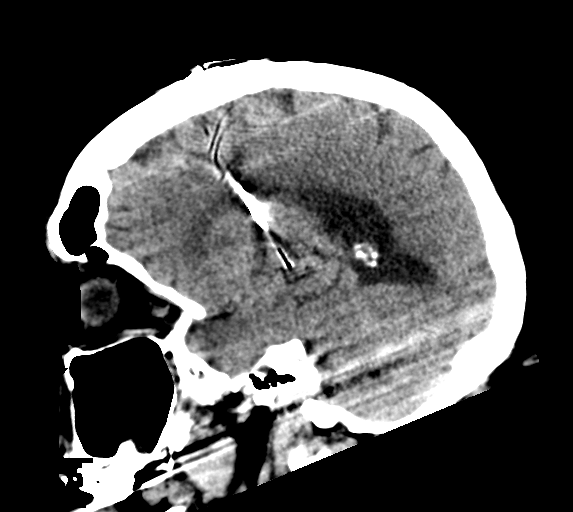
[im 27/54  brain]
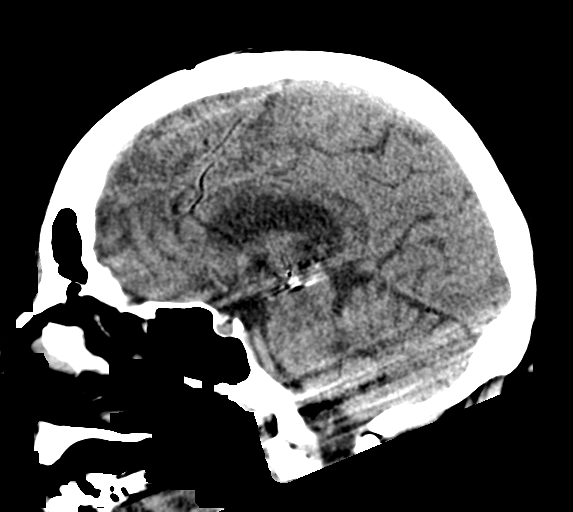
[im 33/54  brain]
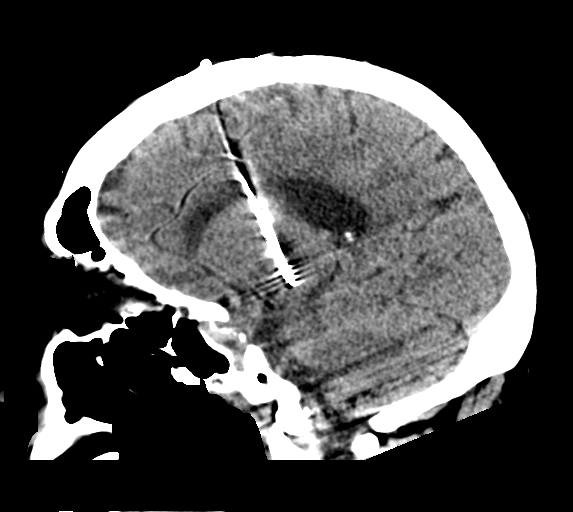

[16 of 47 positions shown; findings below may reference images not displayed]

FINDINGS: Brain: There is significant streak artifact associated with deep
brain stimulator leads within the right scalp soft tissues extending
to the vertex and terminating in the subthalamic regions
bilaterally. Within this limitation, there is no acute intracranial
hemorrhage or new loss of gray-white differentiation. Ventricles are
stable in size. Patchy hypoattenuation in the supratentorial white
matter is nonspecific but probably reflects stable chronic
microvascular ischemic changes.

Vascular: There is mild atherosclerotic calcification at the skull
base.

Skull: Bilateral burr holes at the vertex.

Sinuses/Orbits: No acute finding.

Other: None.
IMPRESSION: No acute intracranial abnormality.  Chronic findings detailed above.

## 2020-12-18 ENCOUNTER — Other Ambulatory Visit (HOSPITAL_COMMUNITY)
Admission: RE | Admit: 2020-12-18 | Discharge: 2020-12-18 | Disposition: A | Discharge status: Home / Self Care | Payer: Medicare Other | Source: Skilled Nursing Facility | Attending: Adult Health | Admitting: Adult Health

## 2020-12-18 DIAGNOSIS — N390 Urinary tract infection, site not specified: Secondary | ICD-10-CM | POA: Diagnosis present

## 2020-12-18 LAB — URINALYSIS, COMPLETE (UACMP) WITH MICROSCOPIC
Bilirubin Urine: NEGATIVE
Glucose, UA: NEGATIVE mg/dL
Ketones, ur: 5 mg/dL — AB
Nitrite: NEGATIVE
Protein, ur: NEGATIVE mg/dL
Specific Gravity, Urine: 1.02 (ref 1.005–1.030)
WBC, UA: >50 WBC/hpf — ABNORMAL HIGH (ref 0–5)
pH: 6 (ref 5.0–8.0)

## 2020-12-20 LAB — URINE CULTURE

## 2020-12-25 ENCOUNTER — Other Ambulatory Visit: Payer: Self-pay | Admitting: Adult Health

## 2020-12-25 MED ORDER — ALPRAZOLAM 0.5 MG PO TABS: 0.5000 mg | ORAL_TABLET | Freq: Two times a day (BID) | ORAL | 0 refills | Status: DC

## 2021-01-11 ENCOUNTER — Non-Acute Institutional Stay (SKILLED_NURSING_FACILITY): Payer: Medicare Other | Admitting: Internal Medicine

## 2021-01-11 ENCOUNTER — Encounter: Payer: Self-pay | Admitting: Internal Medicine

## 2021-01-11 DIAGNOSIS — J449 Chronic obstructive pulmonary disease, unspecified: Secondary | ICD-10-CM | POA: Diagnosis not present

## 2021-01-11 DIAGNOSIS — G20 Parkinson's disease: Secondary | ICD-10-CM

## 2021-01-11 DIAGNOSIS — D751 Secondary polycythemia: Secondary | ICD-10-CM

## 2021-01-11 DIAGNOSIS — F0281 Dementia in other diseases classified elsewhere with behavioral disturbance: Secondary | ICD-10-CM

## 2021-01-11 DIAGNOSIS — I10 Essential (primary) hypertension: Secondary | ICD-10-CM | POA: Diagnosis not present

## 2021-01-11 NOTE — Assessment & Plan Note (Signed)
Diffuse rales on exam. No peripheral edema & O2 sats excellent. Last CXR 12/21 revealed NAD. Repeat CXR if symptomatic

## 2021-01-11 NOTE — Patient Instructions (Signed)
See assessment and plan under each diagnosis in the problem list and acutely for this visit 

## 2021-01-11 NOTE — Progress Notes (Signed)
   NURSING HOME LOCATION:  [facility] ROOM NUMBER:  108 D  CODE STATUS:  Full  PCP:  Ok Edwards NP  This is a nursing facility follow up visit of chronic medical diagnoses & to document compliance with Regulation 483.30 (c) in [employer] [place] Manual Phase 2 which mandates caregiver visit ( visits can alternate among physician, PA or NP as per statutes) within 10 days of 30 days / 60 days/ 90 days post admission to SNF date    Interim medical record and care since last SNF visit was updated with review of diagnostic studies and change in clinical status since last visit were documented.  HPI: She is a permanent resident of this facility with medical diagnoses of GERD, dyslipidemia, essential hypertension, Parkinson's disease complicated by dementia, sleep apnea, history of stroke, and history of anxiety/depression. She has had a deep brain stimulator implanted.  Review of systems: Dysarthria limited completion as speech was essentially indiscernible.The only definite positive was "tingling" in hands.  Physical exam:  Pertinent or positive findings: She exhibits involuntary athetoid, myoclonic activity of the head and right upper extremity.  As noted she is markedly dysarthric and speech is essentially unintelligible.  She exhibits facial grimacing.  As she interacts her eyes are wide and eyebrows raised and mouth down turned. She has multiple missing teeth with gaps anteriorly. First & second heart sounds are accentuated.  She has diffuse low-grade rales posteriorly.  Abdomen is protuberant.  TED hose is present on her left lower extremity.  She is wearing a brace on the right.  She exhibits interosseous wasting of the hands.  The right upper and lower extremities clinically seem to be stronger than the left extremities.    General appearance: no acute distress, increased work of breathing is present.   Lymphatic: No lymphadenopathy about the head, neck,  axilla. Eyes: No conjunctival inflammation or lid edema is present. There is no scleral icterus. Ears:  External ear exam shows no significant lesions or deformities.   Nose:  External nasal examination shows no deformity or inflammation. Nasal mucosa are pink and moist without lesions, exudates Neck:  No thyromegaly, masses, tenderness noted.    Heart:  Normal rate and regular rhythm without gallop, murmur, click, rub .  Lungs: without wheezes, rhonchi, rubs. Abdomen: Bowel sounds are normal. Abdomen is soft and nontender with no organomegaly, hernias, masses. GU: Deferred  Extremities:  No cyanosis, clubbing, edema  Neurologic exam :Balance, Rhomberg, finger to nose testing could not be completed due to clinical state Skin: Warm & dry w/o tenting. No significant lesions or rash.  See summary under each active problem in the Problem List with associated updated therapeutic plan

## 2021-01-11 NOTE — Assessment & Plan Note (Signed)
Recheck H/H & evaluate if documented as persistent/progressive

## 2021-01-11 NOTE — Assessment & Plan Note (Signed)
BP controlled; no change in antihypertensive medications  

## 2021-01-11 NOTE — Assessment & Plan Note (Signed)
Her profound dysarthria limited neurocognitive assessment

## 2021-01-14 ENCOUNTER — Other Ambulatory Visit: Payer: Self-pay | Admitting: Adult Health

## 2021-01-14 MED ORDER — ALPRAZOLAM 0.25 MG PO TABS: 0.2500 mg | ORAL_TABLET | Freq: Two times a day (BID) | ORAL | 0 refills | Status: DC

## 2021-01-16 ENCOUNTER — Non-Acute Institutional Stay (SKILLED_NURSING_FACILITY): Payer: Medicare Other | Admitting: Adult Health

## 2021-01-16 DIAGNOSIS — G20A1 Parkinson's disease without dyskinesia, without mention of fluctuations: Secondary | ICD-10-CM

## 2021-01-16 DIAGNOSIS — G20 Parkinson's disease: Secondary | ICD-10-CM | POA: Diagnosis not present

## 2021-01-16 NOTE — Progress Notes (Signed)
Location:  [facility] Room Number: 108 Place of Service:  SNF (31)   CODE STATUS: full code   Allergies  Allergen Reactions   Oxycodone Other (See Comments)    Can tolerate low dose mixed with tynlelol HALLUCINATIONS    Docosahexaenoic Acid    Eicosapentaenoic Acid (Epa)    Lutein    Omega-3 Fatty Acids    Other    Aspirin Nausea And Vomiting and Other (See Comments)    Severe stomach pain    Codeine Nausea Only    Makes her feel very sick     Chief Complaint  Patient presents with   Acute Visit    Left arm pain     HPI:  She is complaining that since last night she is unable to move her left arm. There are no reports of trauma; no reports of slurred speech; no reports of vision changes. Upon examination; she is resistant to range of motion; after having passive range of motion; she is able to move her arm.   Past Medical History:  Diagnosis Date   Anxiety    Arthritis    Complication of anesthesia    Depression    Dysphasia    GERD (gastroesophageal reflux disease)    Hyperlipidemia    Hypertension    Parkinson's disease ([place])    diagnosed at age 44   Parkinson's disease dementia ([place]) 07/31/2020   PONV (postoperative nausea and vomiting)    states it has been a long time since getting sick   Sleep apnea    Has CPAP but doesnt use   Stroke [facility])    TIA (transient ischemic attack)    not really TIA but abnormal MRI per pt left sided weakness   UTI (lower urinary tract infection)    Vitamin D deficiency     Past Surgical History:  Procedure Laterality Date   BACK SURGERY     Battery change     DBS, 12/2009   BIOPSY N/A 11/29/2013   Procedure: GASTRIC BIOPSY;  Surgeon: Danie Binder, MD;  Location: [facility];  Service: Endoscopy;  Laterality: N/A;   BURR HOLE W/ STEREOTACTIC INSERTION OF DBS LEADS / INTRAOP MICROELECTRODE RECORDING     2007   CARPAL TUNNEL RELEASE Right    COLONOSCOPY WITH PROPOFOL N/A 11/29/2013   Procedure:  COLONOSCOPY WITH PROPOFOL;  Surgeon: Danie Binder, MD;  Location: [facility];  Service: Endoscopy;  Laterality: N/A;  entered cecum @ [phone]; total cecal withdrawal time 21 minutes    ELBOW SURGERY Left    after fx   ESOPHAGEAL DILATION N/A 11/29/2013   Procedure: ESOPHAGEAL DILATION;  Surgeon: Danie Binder, MD;  Location: [facility];  Service: Endoscopy;  Laterality: N/A;  [place] 14/15/16   ESOPHAGOGASTRODUODENOSCOPY (EGD) WITH PROPOFOL N/A 11/29/2013   Procedure: ESOPHAGOGASTRODUODENOSCOPY (EGD) WITH PROPOFOL;  Surgeon: Danie Binder, MD;  Location: [facility];  Service: Endoscopy;  Laterality: N/A;   FOOT SURGERY Left    "something with 2nd 2."   NECK SURGERY     Fusion   POLYPECTOMY N/A 11/29/2013   Procedure: POLYPECTOMY;  Surgeon: Danie Binder, MD;  Location: [facility];  Service: Endoscopy;  Laterality: N/A;  Distal Transverse Colon x2    PR ANALYZE NEUROSTIM BRAIN, FIRST 1H  10/15/2012       PULSE GENERATOR IMPLANT Right 03/30/2018   Procedure: Right Implantable pulse generator change;  Surgeon: Erline Levine, MD;  Location: [place] [facility];  Service: Neurosurgery;  Laterality: Right;   SUBTHALAMIC STIMULATOR BATTERY REPLACEMENT N/A 08/05/2013   Procedure: SUBTHALAMIC STIMULATOR BATTERY REPLACEMENT;  Surgeon: Erline Levine, MD;  Location: [facility];  Service: Neurosurgery;  Laterality: N/A;   SUBTHALAMIC STIMULATOR BATTERY REPLACEMENT N/A 11/26/2015   Procedure: Implantable pulse generator battery change for deep brain stimulator;  Surgeon: Erline Levine, MD;  Location: [facility];  Service: Neurosurgery;  Laterality: N/A;   SUBTHALAMIC STIMULATOR BATTERY REPLACEMENT Right 09/13/2020   Procedure: Right chest implantable pulse generator change for depleted battery;  Surgeon: Erline Levine, MD;  Location: [facility];  Service: Neurosurgery;  Laterality: Right;  3C/RM 18   VAGINAL HYSTERECTOMY     "partial"   WRIST SURGERY Right    after fx    Social History   Socioeconomic History   Marital status: Widowed     Spouse name: Not on file   Number of children: Not on file   Years of education: Not on file   Highest education level: Not on file  Occupational History   Occupation: Retired  Tobacco Use   Smoking status: Former    Packs/day: 0.50    Years: 25.00    Pack years: 12.50    Types: Cigarettes    Quit date: 03/01/2019    Years since quitting: 1.8   Smokeless tobacco: Never  Vaping Use   Vaping Use: Never used  Substance and Sexual Activity   Alcohol use: Not Currently   Drug use: No   Sexual activity: Not on file  Other Topics Concern   Not on file  Social History Narrative   She resides at [facility] (assisted living facility).   Social Determinants of Health   Financial Resource Strain: Not on file  Food Insecurity: Not on file  Transportation Needs: Not on file  Physical Activity: Not on file  Stress: Not on file  Social Connections: Not on file  Intimate Partner Violence: Not on file   Family History  Problem Relation Age of Onset   Colon cancer Paternal Uncle       VITAL SIGNS BP (!) 145/79   Pulse 80   Temp 98.6 F (37 C)   Resp 18   Ht 5' 2" (1.575 m)   Wt 167 lb 11.2 oz (76.1 kg)   SpO2 97%   BMI 30.67 kg/m   Outpatient Encounter Medications as of 01/16/2021  Medication Sig   acetaminophen (TYLENOL) 500 MG tablet Take 500 mg by mouth every 6 (six) hours as needed for mild pain or moderate pain.   albuterol (VENTOLIN HFA) 108 (90 Base) MCG/ACT inhaler Inhale 2 puffs into the lungs every 4 (four) hours as needed for wheezing or shortness of breath.   ALPRAZolam (XANAX) 0.25 MG tablet Take 1 tablet (0.25 mg total) by mouth 2 (two) times daily.   alum & mag hydroxide-simeth (MAALOX PLUS) 400-400-40 MG/5ML suspension Take 20 mLs by mouth 2 (two) times daily as needed for indigestion.   amLODipine (NORVASC) 10 MG tablet Take 10 mg by mouth daily.    atorvastatin (LIPITOR) 40 MG tablet Take 1 tablet (40 mg total) by mouth daily.   BREZTRI AEROSPHERE 160-9-4.8  MCG/ACT AERO Inhale 2 puffs into the lungs 2 (two) times daily.   busPIRone (BUSPAR) 7.5 MG tablet Take 7.5 mg by mouth 2 (two) times daily. For major depressive disorder, recurrent   carbidopa-levodopa (SINEMET IR) 25-100 MG tablet Take 1 tablet by mouth 2 (two) times daily.   Cholecalciferol (VITAMIN D) 50 MCG (2000 UT)  tablet Take 2,000 Units by mouth daily.   clopidogrel (PLAVIX) 75 MG tablet Take 75 mg by mouth daily.   docusate sodium (COLACE) 100 MG capsule Take 100 mg by mouth daily.   donepezil (ARICEPT) 5 MG tablet Take 5 mg by mouth at bedtime. 8 pm for parkinson's dementia   DULoxetine (CYMBALTA) 60 MG capsule Take 60 mg by mouth daily.   guaiFENesin (MUCINEX) 600 MG 12 hr tablet Take 600 mg by mouth daily as needed.   losartan (COZAAR) 100 MG tablet Take 100 mg by mouth daily.   miconazole (ZEASORB-AF) 2 % powder Apply 1 application topically as needed for itching (Every Shift - PRN; PRN 1, PRN 2, PRN 3).   omeprazole (PRILOSEC) 20 MG capsule Take 20 mg by mouth in the morning and at bedtime.    oxybutynin (DITROPAN) 5 MG tablet Take 5 mg by mouth 2 (two) times daily.   polyethylene glycol (MIRALAX / GLYCOLAX) 17 g packet Take 17 g by mouth daily as needed.   pramipexole (MIRAPEX) 1 MG tablet Take 1 mg by mouth at bedtime.   traZODone (DESYREL) 100 MG tablet Take 100 mg by mouth at bedtime.   UNABLE TO FIND Diet Change: Dys 3 (chopped) diet, continue thin liquids; will allow pizza if requested   No facility-administered encounter medications on file as of 01/16/2021.     SIGNIFICANT DIAGNOSTIC EXAMS  PREVIOUS LABS  05-10-20: wbc 6.2; hgb 14.8; hct 44.7; mcv 94.9 plt 251; glucose 122; bun 14; creat 0.67; k+ 3.5; na++ 140 ca 8.9 GFR>60; alk phos 146; albumin 3.9  07-27-20: wbc 6.9; hgb 13.8; hct 41.9; mcv 95.2 plt 221; glucose 111; bun 19; creat 0.82; k+ 3.4; na++ 139; ca 8.9; GFR>60; liver normal albumin 3.6; chol 110; ldl 45; trig 107 hdl 44 07-30-20: k+ 3.6   09-13-20: wbc 8.3;  hgb 17.2; hct 52.6 mcv 95.8 plt 248; glucose 122; bun 15; creat 0.86; k+ 4.5; na++ 139; ca 9.9; GFR>60  12-13-20: urine culture multiple species   Review of Systems  Constitutional:  Negative for malaise/fatigue.  Respiratory:  Negative for cough and shortness of breath.   Cardiovascular:  Negative for chest pain, palpitations and leg swelling.  Gastrointestinal:  Negative for abdominal pain, constipation and heartburn.  Musculoskeletal:  Positive for joint pain. Negative for back pain and myalgias.       Left arm stiffness and pain   Skin: Negative.   Neurological:  Negative for dizziness.  Psychiatric/Behavioral:  The patient is not nervous/anxious.    Physical Exam Constitutional:      General: She is not in acute distress.    Appearance: She is well-developed. She is not diaphoretic.  Neck:     Thyroid: No thyromegaly.  Cardiovascular:     Rate and Rhythm: Normal rate and regular rhythm.     Heart sounds: Murmur heard.     Comments: /6 Pulmonary:     Effort: Pulmonary effort is normal. No respiratory distress.     Breath sounds: Normal breath sounds.  Abdominal:     General: Bowel sounds are normal. There is no distension.     Palpations: Abdomen is soft.     Tenderness: There is no abdominal tenderness.  Musculoskeletal:     Cervical back: Neck supple.     Right lower leg: No edema.     Left lower leg: No edema.     Comments: Has left side weakness Leans head to the left    Left arm: is  able to move without difficulty   Lymphadenopathy:     Cervical: No cervical adenopathy.  Skin:    General: Skin is warm and dry.  Neurological:     Mental Status: She is alert. Mental status is at baseline.  Psychiatric:        Mood and Affect: Mood normal.     ASSESSMENT/ PLAN:  TODAY  Parkinson disease: is without change in status; for her left upper extremity pain; will begin mobic 7.5 mg daily and will check BMP on 01-24-21    Ok Edwards NP [facility]   Contact [phone] Monday through Friday 8am- 5pm  After hours call [phone]

## 2021-01-17 ENCOUNTER — Other Ambulatory Visit (HOSPITAL_COMMUNITY)
Admission: RE | Admit: 2021-01-17 | Discharge: 2021-01-17 | Disposition: A | Discharge status: Home / Self Care | Payer: Medicare Other | Source: Skilled Nursing Facility | Attending: Adult Health | Admitting: Adult Health

## 2021-01-17 DIAGNOSIS — J449 Chronic obstructive pulmonary disease, unspecified: Secondary | ICD-10-CM | POA: Diagnosis present

## 2021-01-17 LAB — CBC WITH DIFFERENTIAL/PLATELET
Abs Immature Granulocytes: 0.03 10*3/uL (ref 0.00–0.07)
Basophils Absolute: 0 10*3/uL (ref 0.0–0.1)
Basophils Relative: 1 %
Eosinophils Absolute: 0.1 10*3/uL (ref 0.0–0.5)
Eosinophils Relative: 2 %
HCT: 43.5 % (ref 36.0–46.0)
Hemoglobin: 14.8 g/dL (ref 12.0–15.0)
Immature Granulocytes: 0 %
Lymphocytes Relative: 37 %
Lymphs Abs: 2.6 10*3/uL (ref 0.7–4.0)
MCH: 31.9 pg (ref 26.0–34.0)
MCHC: 34 g/dL (ref 30.0–36.0)
MCV: 93.8 fL (ref 80.0–100.0)
Monocytes Absolute: 0.5 10*3/uL (ref 0.1–1.0)
Monocytes Relative: 8 %
Neutro Abs: 3.8 10*3/uL (ref 1.7–7.7)
Neutrophils Relative %: 52 %
Platelets: 239 10*3/uL (ref 150–400)
RBC: 4.64 MIL/uL (ref 3.87–5.11)
RDW: 12.6 % (ref 11.5–15.5)
WBC: 7.1 10*3/uL (ref 4.0–10.5)
nRBC: 0 % (ref 0.0–0.2)

## 2021-01-21 ENCOUNTER — Encounter (HOSPITAL_COMMUNITY): Payer: Self-pay | Admitting: Emergency Medicine

## 2021-01-21 ENCOUNTER — Non-Acute Institutional Stay (SKILLED_NURSING_FACILITY): Payer: Medicare Other | Admitting: Adult Health

## 2021-01-21 ENCOUNTER — Emergency Department (HOSPITAL_COMMUNITY)
Admission: EM | Admit: 2021-01-21 | Discharge: 2021-01-21 | Disposition: A | Discharge status: Home / Self Care | Payer: Medicare Other | Attending: Emergency Medicine | Admitting: Emergency Medicine

## 2021-01-21 ENCOUNTER — Other Ambulatory Visit: Payer: Self-pay

## 2021-01-21 ENCOUNTER — Inpatient Hospital Stay
Admission: RE | Admit: 2021-01-21 | Discharge: 2022-06-16 | Disposition: A | Discharge status: Skilled Nursing Facility | Payer: Medicare Other | Source: Ambulatory Visit | Attending: Internal Medicine | Admitting: Internal Medicine

## 2021-01-21 ENCOUNTER — Encounter: Payer: Self-pay | Admitting: Adult Health

## 2021-01-21 DIAGNOSIS — Z8616 Personal history of COVID-19: Secondary | ICD-10-CM | POA: Diagnosis not present

## 2021-01-21 DIAGNOSIS — F32A Depression, unspecified: Secondary | ICD-10-CM | POA: Diagnosis present

## 2021-01-21 DIAGNOSIS — I10 Essential (primary) hypertension: Secondary | ICD-10-CM | POA: Insufficient documentation

## 2021-01-21 DIAGNOSIS — G20 Parkinson's disease: Secondary | ICD-10-CM | POA: Diagnosis not present

## 2021-01-21 DIAGNOSIS — J449 Chronic obstructive pulmonary disease, unspecified: Secondary | ICD-10-CM | POA: Insufficient documentation

## 2021-01-21 DIAGNOSIS — Z79899 Other long term (current) drug therapy: Secondary | ICD-10-CM | POA: Diagnosis not present

## 2021-01-21 DIAGNOSIS — M545 Low back pain, unspecified: Secondary | ICD-10-CM | POA: Insufficient documentation

## 2021-01-21 DIAGNOSIS — M25562 Pain in left knee: Secondary | ICD-10-CM | POA: Insufficient documentation

## 2021-01-21 DIAGNOSIS — Z20822 Contact with and (suspected) exposure to covid-19: Secondary | ICD-10-CM | POA: Diagnosis not present

## 2021-01-21 DIAGNOSIS — F0390 Unspecified dementia without behavioral disturbance: Secondary | ICD-10-CM | POA: Diagnosis not present

## 2021-01-21 DIAGNOSIS — Z7951 Long term (current) use of inhaled steroids: Secondary | ICD-10-CM | POA: Diagnosis not present

## 2021-01-21 DIAGNOSIS — Z87891 Personal history of nicotine dependence: Secondary | ICD-10-CM | POA: Insufficient documentation

## 2021-01-21 DIAGNOSIS — R4589 Other symptoms and signs involving emotional state: Secondary | ICD-10-CM

## 2021-01-21 DIAGNOSIS — F29 Unspecified psychosis not due to a substance or known physiological condition: Secondary | ICD-10-CM | POA: Insufficient documentation

## 2021-01-21 DIAGNOSIS — R45851 Suicidal ideations: Secondary | ICD-10-CM

## 2021-01-21 DIAGNOSIS — Z209 Contact with and (suspected) exposure to unspecified communicable disease: Secondary | ICD-10-CM | POA: Insufficient documentation

## 2021-01-21 DIAGNOSIS — F419 Anxiety disorder, unspecified: Secondary | ICD-10-CM

## 2021-01-21 DIAGNOSIS — F430 Acute stress reaction: Secondary | ICD-10-CM

## 2021-01-21 HISTORY — DX: Depression, unspecified: F32.A

## 2021-01-21 HISTORY — DX: Suicidal ideations: R45.851

## 2021-01-21 LAB — COMPREHENSIVE METABOLIC PANEL
ALT: 5 U/L (ref 0–44)
AST: 17 U/L (ref 15–41)
Albumin: 4.2 g/dL (ref 3.5–5.0)
Alkaline Phosphatase: 119 U/L (ref 38–126)
Anion gap: 8 (ref 5–15)
BUN: 13 mg/dL (ref 8–23)
CO2: 27 mmol/L (ref 22–32)
Calcium: 9.2 mg/dL (ref 8.9–10.3)
Chloride: 103 mmol/L (ref 98–111)
Creatinine, Ser: 0.8 mg/dL (ref 0.44–1.00)
GFR, Estimated: >60 mL/min (ref >60)
Glucose, Bld: 117 mg/dL — ABNORMAL HIGH (ref 70–99)
Potassium: 3.3 mmol/L — ABNORMAL LOW (ref 3.5–5.1)
Sodium: 138 mmol/L (ref 135–145)
Total Bilirubin: 0.4 mg/dL (ref 0.3–1.2)
Total Protein: 7.6 g/dL (ref 6.5–8.1)

## 2021-01-21 LAB — CBC WITH DIFFERENTIAL/PLATELET
Abs Immature Granulocytes: 0.05 10*3/uL (ref 0.00–0.07)
Basophils Absolute: 0 10*3/uL (ref 0.0–0.1)
Basophils Relative: 1 %
Eosinophils Absolute: 0 10*3/uL (ref 0.0–0.5)
Eosinophils Relative: 0 %
HCT: 46.8 % — ABNORMAL HIGH (ref 36.0–46.0)
Hemoglobin: 15.9 g/dL — ABNORMAL HIGH (ref 12.0–15.0)
Immature Granulocytes: 1 %
Lymphocytes Relative: 23 %
Lymphs Abs: 1.8 10*3/uL (ref 0.7–4.0)
MCH: 31.4 pg (ref 26.0–34.0)
MCHC: 34 g/dL (ref 30.0–36.0)
MCV: 92.3 fL (ref 80.0–100.0)
Monocytes Absolute: 0.6 10*3/uL (ref 0.1–1.0)
Monocytes Relative: 7 %
Neutro Abs: 5.2 10*3/uL (ref 1.7–7.7)
Neutrophils Relative %: 68 %
Platelets: 260 10*3/uL (ref 150–400)
RBC: 5.07 MIL/uL (ref 3.87–5.11)
RDW: 12.3 % (ref 11.5–15.5)
WBC: 7.7 10*3/uL (ref 4.0–10.5)
nRBC: 0 % (ref 0.0–0.2)

## 2021-01-21 LAB — RAPID URINE DRUG SCREEN, HOSP PERFORMED
Amphetamines: NOT DETECTED
Barbiturates: NOT DETECTED
Benzodiazepines: POSITIVE — AB
Cocaine: NOT DETECTED
Opiates: NOT DETECTED
Tetrahydrocannabinol: NOT DETECTED

## 2021-01-21 LAB — ETHANOL: Alcohol, Ethyl (B): <10 mg/dL (ref <10)

## 2021-01-21 LAB — RESP PANEL BY RT-PCR (FLU A&B, COVID) ARPGX2
Influenza A by PCR: NEGATIVE
Influenza B by PCR: NEGATIVE
SARS Coronavirus 2 by RT PCR: NEGATIVE

## 2021-01-21 MED ORDER — MELOXICAM 7.5 MG PO TABS
ORAL_TABLET | ORAL | Status: AC
  Filled 2021-01-21: qty 1

## 2021-01-21 MED ORDER — BUDESON-GLYCOPYRROL-FORMOTEROL 160-9-4.8 MCG/ACT IN AERO: 2.0000 | INHALATION_SPRAY | Freq: Two times a day (BID) | RESPIRATORY_TRACT | Status: DC

## 2021-01-21 MED ORDER — POTASSIUM CHLORIDE CRYS ER 20 MEQ PO TBCR
40.0000 meq | EXTENDED_RELEASE_TABLET | Freq: Once | ORAL | Status: AC
  Administered 2021-01-21: 40 meq via ORAL
  Filled 2021-01-21: qty 4

## 2021-01-21 MED ORDER — ATORVASTATIN CALCIUM 40 MG PO TABS
40.0000 mg | ORAL_TABLET | Freq: Every day | ORAL | Status: DC
  Filled 2021-01-21: qty 1

## 2021-01-21 MED ORDER — ALPRAZOLAM 0.5 MG PO TABS
0.2500 mg | ORAL_TABLET | Freq: Once | ORAL | Status: AC
  Administered 2021-01-21: 0.25 mg via ORAL
  Filled 2021-01-21: qty 1

## 2021-01-21 MED ORDER — UMECLIDINIUM BROMIDE 62.5 MCG/INH IN AEPB: 1.0000 | INHALATION_SPRAY | Freq: Every day | RESPIRATORY_TRACT | Status: DC

## 2021-01-21 MED ORDER — OXYBUTYNIN CHLORIDE 5 MG PO TABS
5.0000 mg | ORAL_TABLET | Freq: Two times a day (BID) | ORAL | Status: DC
  Administered 2021-01-21: 5 mg via ORAL
  Filled 2021-01-21: qty 1

## 2021-01-21 MED ORDER — DONEPEZIL HCL 5 MG PO TABS
5.0000 mg | ORAL_TABLET | Freq: Every day | ORAL | Status: DC
  Administered 2021-01-21: 5 mg via ORAL
  Filled 2021-01-21: qty 1

## 2021-01-21 MED ORDER — ALPRAZOLAM 0.5 MG PO TABS
0.2500 mg | ORAL_TABLET | Freq: Two times a day (BID) | ORAL | Status: DC
  Administered 2021-01-21: 0.25 mg via ORAL
  Filled 2021-01-21: qty 1

## 2021-01-21 MED ORDER — DOCUSATE SODIUM 100 MG PO CAPS
100.0000 mg | ORAL_CAPSULE | Freq: Every day | ORAL | Status: DC
  Filled 2021-01-21: qty 1

## 2021-01-21 MED ORDER — CLOPIDOGREL BISULFATE 75 MG PO TABS
75.0000 mg | ORAL_TABLET | Freq: Every day | ORAL | Status: DC
  Filled 2021-01-21: qty 1

## 2021-01-21 MED ORDER — LOSARTAN POTASSIUM 25 MG PO TABS
100.0000 mg | ORAL_TABLET | Freq: Every day | ORAL | Status: DC
  Filled 2021-01-21: qty 4

## 2021-01-21 MED ORDER — PRAMIPEXOLE DIHYDROCHLORIDE 0.25 MG PO TABS
1.0000 mg | ORAL_TABLET | Freq: Every day | ORAL | Status: DC
  Administered 2021-01-21: 1 mg via ORAL
  Filled 2021-01-21: qty 4

## 2021-01-21 MED ORDER — PANTOPRAZOLE SODIUM 40 MG PO TBEC
40.0000 mg | DELAYED_RELEASE_TABLET | Freq: Every day | ORAL | Status: DC
  Filled 2021-01-21: qty 1

## 2021-01-21 MED ORDER — CARBIDOPA-LEVODOPA 25-100 MG PO TABS
1.0000 | ORAL_TABLET | Freq: Two times a day (BID) | ORAL | Status: DC
  Administered 2021-01-21: 1 via ORAL
  Filled 2021-01-21: qty 1

## 2021-01-21 MED ORDER — MELOXICAM 7.5 MG PO TABS
7.5000 mg | ORAL_TABLET | Freq: Every day | ORAL | Status: DC
  Filled 2021-01-21 (×3): qty 1

## 2021-01-21 MED ORDER — ACETAMINOPHEN 500 MG PO TABS
500.0000 mg | ORAL_TABLET | Freq: Four times a day (QID) | ORAL | Status: DC | PRN
  Administered 2021-01-21: 500 mg via ORAL
  Filled 2021-01-21: qty 1

## 2021-01-21 MED ORDER — ALBUTEROL SULFATE HFA 108 (90 BASE) MCG/ACT IN AERS
2.0000 | INHALATION_SPRAY | RESPIRATORY_TRACT | Status: DC | PRN
  Administered 2021-01-21: 2 via RESPIRATORY_TRACT
  Filled 2021-01-21: qty 6.7

## 2021-01-21 MED ORDER — AMLODIPINE BESYLATE 5 MG PO TABS
10.0000 mg | ORAL_TABLET | Freq: Every day | ORAL | Status: DC
  Filled 2021-01-21: qty 2

## 2021-01-21 MED ORDER — TRAZODONE HCL 50 MG PO TABS
100.0000 mg | ORAL_TABLET | Freq: Every day | ORAL | Status: DC
  Administered 2021-01-21 (×2): 100 mg via ORAL
  Filled 2021-01-21: qty 2

## 2021-01-21 MED ORDER — MOMETASONE FURO-FORMOTEROL FUM 200-5 MCG/ACT IN AERO
2.0000 | INHALATION_SPRAY | Freq: Two times a day (BID) | RESPIRATORY_TRACT | Status: DC
  Administered 2021-01-21: 2 via RESPIRATORY_TRACT
  Filled 2021-01-21: qty 8.8

## 2021-01-21 MED ORDER — DULOXETINE HCL 30 MG PO CPEP
60.0000 mg | ORAL_CAPSULE | Freq: Every day | ORAL | Status: DC
  Administered 2021-01-21 (×2): 60 mg via ORAL
  Filled 2021-01-21: qty 2

## 2021-01-21 MED ORDER — BUSPIRONE HCL 5 MG PO TABS
7.5000 mg | ORAL_TABLET | Freq: Two times a day (BID) | ORAL | Status: DC
  Administered 2021-01-21: 7.5 mg via ORAL
  Filled 2021-01-21: qty 2

## 2021-01-21 NOTE — Progress Notes (Signed)
   01/21/21 1438  [place] (Walk-ins at [facility] only)  How Did You Hear About [place]? Other (Comment)  What Is the Reason for Your Visit/Call Today? 75 year old female present to [facility] from [place] with suicidal ideations and depression. During the assessment patient was crying and hard to understand, Abby, NT, had to assist TTS with understanding patient.  How Long Has This Been Causing You Problems? 1 wk - 1 month  Have You Recently Had Any Thoughts About Hurting Yourself? Yes  How long ago did you have thoughts about hurting yourself? Patient report suicidal ideations triggered by depression on the way she's getting treated at the nursing home she's lives  Are You Planning to Commit Suicide/Harm Yourself At This time? No (patient report suicidal ideations with a plan but stated she wants to die)  Have you Recently Had Thoughts About [place]? No  Are You Planning To Harm Someone At This Time? No  Are you currently experiencing any auditory, visual or other hallucinations? No  Have You Used Any Alcohol or Drugs in the Past 24 Hours? No  Do you have any current medical co-morbidities that require immediate attention? No  What Do You Feel Would Help You the Most Today? Stress Management  If access to [facility] was not available, would you have sought care in the Emergency Department? Yes  Determination of Need Emergent (2 hours)  Options For Referral Outpatient Therapy

## 2021-01-21 NOTE — Discharge Instructions (Addendum)
It was our pleasure to provide your ER care today - we hope that you feel better.  Follow up with primary care doctor in the next 1-2 days.  Discuss with your doctor possible medication adjustment and discuss facilitating close behavioral health follow up.  Safety precautions - keep scissors and other sharp objects from patient.   Continue your meds including cymbalta, buspar, trazodone, and xanax. Follow up with behavioral health doctor in the coming week.   Return to ER if worse, new symptoms, fevers, new or severe pain, trouble breathing, or other emergency concern.

## 2021-01-21 NOTE — ED Notes (Signed)
AVA-SYS placed in pts room. Set up # called [phone] and spoke with Ayan for set up. Requested information provided.

## 2021-01-21 NOTE — ED Triage Notes (Addendum)
Pt from [facility]. Staff stated pt was found to have a pair of scissors pressed to her chest.   Pt stated " I dont want to kill myself but I want to die"   Pt stated she is being bullied by staff at [facility]. States staff said " I dont want to deal with her blood if she kills herself"

## 2021-01-21 NOTE — BH Assessment (Signed)
Due to TTS unable to understand patient, TTS conducted a triage screening only.

## 2021-01-21 NOTE — Progress Notes (Signed)
Location:  [facility] Room Number: 108 Place of Service:  SNF (31)   CODE STATUS: full code   Allergies  Allergen Reactions   Oxycodone Other (See Comments)    Can tolerate low dose mixed with tynlelol HALLUCINATIONS    Docosahexaenoic Acid    Eicosapentaenoic Acid (Epa)    Lutein    Omega-3 Fatty Acids    Other    Aspirin Nausea And Vomiting and Other (See Comments)    Severe stomach pain    Codeine Nausea Only    Makes her feel very sick     Chief Complaint  Patient presents with   Acute Visit    Suicidal ideation     HPI:  She was in her room eating lunch; was found with a pair of scissors; when asked what she was planning. She stated that she wanted to die; was going to stab herself and began crying. There were no tears wit her crying. She did not want to go to the hospital. We did talk and she understands that she must go to the hospital for evaluation as she has a plan to kill herself.  She recently had her deep brain stimulator adjusted; since that time she has become obsessed about this adjustment.    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   PONV (postoperative nausea and vomiting)    states it has been a long time since getting sick   Sleep apnea    Has CPAP but doesnt use   Stroke [facility])    TIA (transient ischemic attack)    not really TIA but abnormal MRI per pt left sided weakness   UTI (lower urinary tract infection)    Vitamin D deficiency     Past Surgical History:  Procedure Laterality Date   BACK SURGERY     Battery change     DBS, 12/2009   BIOPSY N/A 11/29/2013   Procedure: GASTRIC BIOPSY;  Surgeon: Danie Binder, MD;  Location: [facility];  Service: Endoscopy;   Laterality: N/A;   BURR HOLE W/ STEREOTACTIC INSERTION OF DBS LEADS / INTRAOP MICROELECTRODE RECORDING     2007   CARPAL TUNNEL RELEASE Right    COLONOSCOPY WITH PROPOFOL N/A 11/29/2013   Procedure: COLONOSCOPY WITH PROPOFOL;  Surgeon: Danie Binder, MD;  Location: [facility];  Service: Endoscopy;  Laterality: N/A;  entered cecum @ [phone]; total cecal withdrawal time 21 minutes    ELBOW SURGERY Left    after fx   ESOPHAGEAL DILATION N/A 11/29/2013   Procedure: ESOPHAGEAL DILATION;  Surgeon: Danie Binder, MD;  Location: [facility];  Service: Endoscopy;  Laterality: N/A;  [facility] 14/15/16   ESOPHAGOGASTRODUODENOSCOPY (EGD) WITH PROPOFOL N/A 11/29/2013   Procedure: ESOPHAGOGASTRODUODENOSCOPY (EGD) WITH PROPOFOL;  Surgeon: Danie Binder, MD;  Location: [facility];  Service: Endoscopy;  Laterality: N/A;   FOOT SURGERY Left    "something with 2nd 2."   NECK SURGERY     Fusion   POLYPECTOMY N/A 11/29/2013   Procedure: POLYPECTOMY;  Surgeon: Danie Binder, MD;  Location: [facility];  Service: Endoscopy;  Laterality: N/A;  Distal Transverse Colon x2  PR ANALYZE NEUROSTIM BRAIN, FIRST 1H  10/15/2012       PULSE GENERATOR IMPLANT Right 03/30/2018   Procedure: Right Implantable pulse generator change;  Surgeon: Erline Levine, MD;  Location: [place];  Service: Neurosurgery;  Laterality: Right;   SUBTHALAMIC STIMULATOR BATTERY REPLACEMENT N/A 08/05/2013   Procedure: SUBTHALAMIC STIMULATOR BATTERY REPLACEMENT;  Surgeon: Erline Levine, MD;  Location: [place] NEUR[facility] ORS;  Service: Neurosurgery;  Laterality: N/A;   SUBTHALAMIC STIMULATOR BATTERY REPLACEMENT N/A 11/26/2015   Procedure: Implantable pulse generator battery change for deep brain stimulator;  Surgeon: Erline Levine, MD;  Location: [place] NEUR[facility]S;  Service: Neurosurgery;  Laterality: N/A;   SUBTHALAMIC STIMULATOR BATTERY REPLACEMENT Right 09/13/2020   Procedure: Right chest implantable pulse generator change for depleted battery;  Surgeon: Erline Levine, MD;  Location: [place];   Service: Neurosurgery;  Laterality: Right;  3C/RM 18   VAGINAL HYSTERECTOMY     "partial"   WRIST SURGERY Right    after fx    Social History   Socioeconomic History   Marital status: Widowed    Spouse name: Not on file   Number of children: Not on file   Years of education: Not on file   Highest education level: Not on file  Occupational History   Occupation: Retired  Tobacco Use   Smoking status: Former    Packs/day: 0.50    Years: 25.00    Pack years: 12.50    Types: Cigarettes    Quit date: 03/01/2019    Years since quitting: 1.8   Smokeless tobacco: Never  Vaping Use   Vaping Use: Never used  Substance and Sexual Activity   Alcohol use: Not Currently   Drug use: No   Sexual activity: Not on file  Other Topics Concern   Not on file  Social History Narrative   She resides at [facility] (assisted living facility).   Social Determinants of Health   Financial Resource Strain: Not on file  Food Insecurity: Not on file  Transportation Needs: Not on file  Physical Activity: Not on file  Stress: Not on file  Social Connections: Not on file  Intimate Partner Violence: Not on file   Family History  Problem Relation Age of Onset   Colon cancer Paternal Uncle       VITAL SIGNS BP 123/67   Pulse 74   Temp (!) 97.1 F (36.2 C)   Resp 18   Ht 5' 2"  (1.575 m)   Wt 167 lb 9.6 oz (76 kg)   SpO2 94%   BMI 30.65 kg/m   Outpatient Encounter Medications as of 01/21/2021  Medication Sig   acetaminophen (TYLENOL) 500 MG tablet Take 500 mg by mouth every 6 (six) hours as needed for mild pain or moderate pain.   albuterol (VENTOLIN HFA) 108 (90 Base) MCG/ACT inhaler Inhale 2 puffs into the lungs every 4 (four) hours as needed for wheezing or shortness of breath.   ALPRAZolam (XANAX) 0.25 MG tablet Take 1 tablet (0.25 mg total) by mouth 2 (two) times daily.   alum & mag hydroxide-simeth (MAALOX PLUS) 400-400-40 MG/5ML suspension Take 20 mLs by mouth 2 (two) times daily  as needed for indigestion.   amLODipine (NORVASC) 10 MG tablet Take 10 mg by mouth daily.    atorvastatin (LIPITOR) 40 MG tablet Take 1 tablet (40 mg total) by mouth daily.   BREZTRI AEROSPHERE 160-9-4.8 MCG/ACT AERO Inhale 2 puffs into the lungs 2 (two) times daily.   busPIRone (BUSPAR) 7.5 MG tablet  Take 7.5 mg by mouth 2 (two) times daily. For major depressive disorder, recurrent   carbidopa-levodopa (SINEMET IR) 25-100 MG tablet Take 1 tablet by mouth 2 (two) times daily.   Cholecalciferol (VITAMIN D) 50 MCG (2000 UT) tablet Take 2,000 Units by mouth daily.   clopidogrel (PLAVIX) 75 MG tablet Take 75 mg by mouth daily.   docusate sodium (COLACE) 100 MG capsule Take 100 mg by mouth daily.   donepezil (ARICEPT) 5 MG tablet Take 5 mg by mouth at bedtime. 8 pm for parkinson's dementia   DULoxetine (CYMBALTA) 60 MG capsule Take 60 mg by mouth daily.   guaiFENesin (MUCINEX) 600 MG 12 hr tablet Take 600 mg by mouth daily as needed.   losartan (COZAAR) 100 MG tablet Take 100 mg by mouth daily.   miconazole (ZEASORB-AF) 2 % powder Apply 1 application topically as needed for itching (Every Shift - PRN; PRN 1, PRN 2, PRN 3).   omeprazole (PRILOSEC) 20 MG capsule Take 20 mg by mouth in the morning and at bedtime.    oxybutynin (DITROPAN) 5 MG tablet Take 5 mg by mouth 2 (two) times daily.   polyethylene glycol (MIRALAX / GLYCOLAX) 17 g packet Take 17 g by mouth daily as needed.   pramipexole (MIRAPEX) 1 MG tablet Take 1 mg by mouth at bedtime.   traZODone (DESYREL) 100 MG tablet Take 100 mg by mouth at bedtime.   UNABLE TO FIND Diet Change: Dys 3 (chopped) diet, continue thin liquids; will allow pizza if requested   No facility-administered encounter medications on file as of 01/21/2021.     SIGNIFICANT DIAGNOSTIC EXAMS   LABS REVIEWED; PREVIOUS   05-10-20: wbc 6.2; hgb 14.8; hct 44.7; mcv 94.9 plt 251; glucose 122; bun 14; creat 0.67; k+ 3.5; na++ 140 ca 8.9 GFR>60; alk phos 146; albumin 3.9   07-27-20: wbc 6.9; hgb 13.8; hct 41.9; mcv 95.2 plt 221; glucose 111; bun 19; creat 0.82; k+ 3.4; na++ 139; ca 8.9; GFR>60; liver normal albumin 3.6; chol 110; ldl 45; trig 107 hdl 44 07-30-20: k+ 3.6  09-13-20: wbc 8.3; hgb 17.2; hct 52.6 mcv 95.8 plt 248; glucose 122; bun 15; creat 0.86; k+ 4.5; na++ 139; ca 9.9; GFR>60  12-13-20: urine culture multiple species  NO NEW LABS.   Review of Systems  Constitutional:  Negative for malaise/fatigue.  Respiratory:  Negative for cough and shortness of breath.   Cardiovascular:  Negative for chest pain, palpitations and leg swelling.  Gastrointestinal:  Negative for abdominal pain, constipation and heartburn.  Musculoskeletal:  Negative for back pain, joint pain and myalgias.  Skin: Negative.   Neurological:  Negative for dizziness.       Tremors present   Psychiatric/Behavioral:  Positive for depression and suicidal ideas. The patient is nervous/anxious.     Physical Exam Constitutional:      General: She is not in acute distress.    Appearance: She is well-developed. She is not diaphoretic.  Neck:     Thyroid: No thyromegaly.  Cardiovascular:     Rate and Rhythm: Normal rate and regular rhythm.     Pulses: Normal pulses.     Heart sounds: Normal heart sounds.  Pulmonary:     Effort: Pulmonary effort is normal. No respiratory distress.     Breath sounds: Normal breath sounds.  Abdominal:     General: Bowel sounds are normal. There is no distension.     Palpations: Abdomen is soft.     Tenderness: There is no  abdominal tenderness.  Musculoskeletal:        General: Normal range of motion.     Cervical back: Neck supple.     Right lower leg: No edema.     Left lower leg: No edema.     Comments: Has left side weakness Leans head to the left  Bilateral hand tremor left >right   Lymphadenopathy:     Cervical: No cervical adenopathy.  Skin:    General: Skin is warm and dry.  Neurological:     Mental Status: She is alert. Mental status  is at baseline.  Psychiatric:     Comments: Is crying without tears Visibly upset  Is able to focus around her      ASSESSMENT/ PLAN:  TODAY  Depression with suicidal ideation: will send her to the ED for evaluation and treatment options.      Ok Edwards NP [facility] Adult Medicine  Contact [phone] Monday through Friday 8am- 5pm  After hours call [phone]

## 2021-01-21 NOTE — ED Provider Notes (Signed)
[facility] EMERGENCY DEPARTMENT Provider Note   CSN: [id number] Arrival date & time: 01/21/21  1241     History Chief Complaint  Patient presents with   Suicidal    Courtney Tucker is a 75 y.o. female with history of parkinson's disease and dementia who presents from SNF with suicidal ideation.  Staff reports finding patient with a pair of scissors pressed to her chest and stating "I don't want to kill myself but I want to die". Patient stated she was being bullied by staff at facility, states staff said "I don't want to deal with her blood if she kills herself." On exam she is feeling sad and endorses SI. No HI, visual or auditory hallucinations. She is also complaining of lower back pain and left knee pain. No other complaints at this time. No recent illness, chest pain, SOB, abdominal pain.   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   PONV (postoperative nausea and vomiting)    states it has been a long time since getting sick   Sleep apnea    Has CPAP but doesnt use   Stroke [facility])    TIA (transient ischemic attack)    not really TIA but abnormal MRI per pt left sided weakness   UTI (lower urinary tract infection)    Vitamin D deficiency     Patient Active Problem List   Diagnosis Date Noted   Depression with suicidal ideation 01/21/2021   Polycythemia 01/11/2021   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   COPD without exacerbation ([place]) 07/25/2020   GERD without esophagitis 07/25/2020   Chronic constipation 07/25/2020   Chronic pain syndrome 07/25/2020   H/o COVID-19 virus infection--December 2020 01/22/2020   TIA (transient ischemic attack) 03/29/2019   S/P deep brain stimulator placement  11/13/2015   OAB (overactive bladder) 07/11/2015   Parkinson disease ([place]) 02/23/2015   Major depression, recurrent, chronic ([place]) 07/25/2013   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   Abnormality of gait 10/15/2012   Dysphasia 10/15/2012   Mixed hyperlipidemia 02/14/2009   Essential hypertension 02/14/2009    Past Surgical History:  Procedure Laterality Date   BACK SURGERY     Battery change     DBS, 12/2009   BIOPSY N/A 11/29/2013   Procedure: GASTRIC BIOPSY;  Surgeon: Danie Binder, MD;  Location: [facility];  Service: Endoscopy;  Laterality: N/A;   BURR HOLE W/ STEREOTACTIC INSERTION OF DBS LEADS / INTRAOP MICROELECTRODE RECORDING     2007   CARPAL TUNNEL RELEASE Right    COLONOSCOPY WITH PROPOFOL N/A 11/29/2013   Procedure: COLONOSCOPY WITH PROPOFOL;  Surgeon: Danie Binder, MD;  Location: [facility];  Service: Endoscopy;  Laterality: N/A;  entered cecum @ [phone]; total cecal withdrawal time 21 minutes    ELBOW SURGERY Left    after fx   ESOPHAGEAL DILATION N/A 11/29/2013   Procedure: ESOPHAGEAL DILATION;  Surgeon: Danie Binder, MD;  Location: [facility];  Service: Endoscopy;  Laterality: N/A;  [facility] 14/15/16   ESOPHAGOGASTRODUODENOSCOPY (EGD) WITH PROPOFOL N/A 11/29/2013   Procedure: ESOPHAGOGASTRODUODENOSCOPY (EGD) WITH PROPOFOL;  Surgeon: Danie Binder, MD;  Location[facility];  Service: Endoscopy;  Laterality: N/A;   FOOT SURGERY Left    "something with 2nd 2."   NECK SURGERY     Fusion   POLYPECTOMY N/A 11/29/2013   Procedure: POLYPECTOMY;  Surgeon: Danie Binder, MD;  Location: [facility];  Service: Endoscopy;  Laterality: N/A;  Distal Transverse Colon x2    PR ANALYZE NEUROSTIM BRAIN, FIRST 1H  10/15/2012       PULSE GENERATOR IMPLANT Right 03/30/2018   Procedure: Right Implantable pulse generator change;  Surgeon: Erline Levine, MD;  Location: [place];  Service: Neurosurgery;  Laterality: Right;   SUBTHALAMIC STIMULATOR BATTERY REPLACEMENT N/A 08/05/2013   Procedure: SUBTHALAMIC STIMULATOR BATTERY  REPLACEMENT;  Surgeon: Erline Levine, MD;  Location: [place] [facility]S;  Service: Neurosurgery;  Laterality: N/A;   SUBTHALAMIC STIMULATOR BATTERY REPLACEMENT N/A 11/26/2015   Procedure: Implantable pulse generator battery change for deep brain stimulator;  Surgeon: Erline Levine, MD;  Location: [place] NEURO ORS;  Service: Neurosurgery;  Laterality: N/A;   SUBTHALAMIC STIMULATOR BATTERY REPLACEMENT Right 09/13/2020   Procedure: Right chest implantable pulse generator change for depleted battery;  Surgeon: Erline Levine, MD;  Location: [place];  Service: Neurosurgery;  Laterality: Right;  3C/RM 18   VAGINAL HYSTERECTOMY     "partial"   WRIST SURGERY Right    after fx     OB History   No obstetric history on file.     Family History  Problem Relation Age of Onset   Colon cancer Paternal Uncle     Social History   Tobacco Use   Smoking status: Former    Packs/day: 0.50    Years: 25.00    Pack years: 12.50    Types: Cigarettes    Quit date: 03/01/2019    Years since quitting: 1.8   Smokeless tobacco: Never  Vaping Use   Vaping Use: Never used  Substance Use Topics   Alcohol use: Not Currently   Drug use: No    Home Medications Prior to Admission medications   Medication Sig Start Date End Date Taking? Authorizing Provider  acetaminophen (TYLENOL) 500 MG tablet Take 500 mg by mouth every 6 (six) hours as needed for mild pain or moderate pain.    [provider]  albuterol (VENTOLIN HFA) 108 (90 Base) MCG/ACT inhaler Inhale 2 puffs into the lungs every 4 (four) hours as needed for wheezing or shortness of breath.    [provider]  ALPRAZolam Duanne Moron) 0.25 MG tablet Take 1 tablet (0.25 mg total) by mouth 2 (two) times daily. 01/14/21   Gerlene Fee, NP  alum & mag hydroxide-simeth (MAALOX PLUS) 400-400-40 MG/5ML suspension Take 20 mLs by mouth 2 (two) times daily as needed for indigestion. 08/19/20   [provider]  amLODipine (NORVASC) 10 MG tablet Take 10 mg by  mouth daily.     [provider]  atorvastatin (LIPITOR) 40 MG tablet Take 1 tablet (40 mg total) by mouth daily. 01/24/20   Emokpae, Courage, MD  BREZTRI AEROSPHERE 160-9-4.8 MCG/ACT AERO Inhale 2 puffs into the lungs 2 (two) times daily. 09/22/19   [provider]  busPIRone (BUSPAR) 7.5 MG tablet Take 7.5 mg by mouth 2 (two) times daily. For major depressive disorder, recurrent    [provider]  carbidopa-levodopa (SINEMET IR) 25-100 MG tablet Take 1 tablet by mouth 2 (two) times daily. 07/23/20   [provider]  Cholecalciferol (VITAMIN D) 50 MCG (2000 UT) tablet Take 2,000 Units by mouth daily.    [provider]  clopidogrel (PLAVIX) 75 MG tablet Take 75 mg by mouth daily.    [provider]  docusate sodium (COLACE) 100 MG capsule Take 100 mg by mouth daily.    [provider]  donepezil (ARICEPT) 5 MG tablet Take 5 mg by mouth at bedtime. 8 pm for parkinson's dementia    [provider]  DULoxetine (CYMBALTA) 60 MG capsule Take 60 mg by mouth daily.    [provider]  guaiFENesin (MUCINEX) 600 MG 12 hr tablet Take 600 mg by mouth daily as needed.    [provider]  losartan (COZAAR) 100 MG tablet Take 100 mg by mouth daily.    [provider]  miconazole (ZEASORB-AF) 2 % powder Apply 1 application topically as needed for itching (Every Shift - PRN; PRN 1, PRN 2, PRN 3).    [provider]  omeprazole (PRILOSEC) 20 MG capsule Take 20 mg by mouth in the morning and at bedtime.     [provider]  oxybutynin (DITROPAN) 5 MG tablet Take 5 mg by mouth 2 (two) times daily.    [provider]  polyethylene glycol (MIRALAX / GLYCOLAX) 17 g packet Take 17 g by mouth daily as needed.    [provider]  pramipexole (MIRAPEX) 1 MG tablet Take 1 mg by mouth at bedtime. 06/18/16   [provider]  traZODone (DESYREL) 100 MG tablet Take 100 mg by mouth at  bedtime.      UNABLE TO FIND Diet Change: Dys 3 (chopped) diet, continue thin liquids; will allow pizza if requested    [provider]    Allergies    Oxycodone, Docosahexaenoic acid, Eicosapentaenoic acid (epa), Lutein, Omega-3 fatty acids, Other, Aspirin, and Codeine  Review of Systems   Review of Systems  Constitutional:  Negative for chills and fever.  Respiratory:  Negative for shortness of breath.   Cardiovascular:  Negative for chest pain.  Gastrointestinal:  Negative for abdominal pain.  Musculoskeletal:  Positive for back pain.       Left knee pain  All other systems reviewed and are negative.  Physical Exam Updated Vital Signs BP 135/63   Pulse 97   Temp 98.6 F (37 C)   Resp 19   Ht '5\' 2"'$  (1.575 m)   Wt 75.8 kg   SpO2 92%   BMI 30.54 kg/m   Physical Exam Vitals and nursing note reviewed.  Constitutional:      Appearance: Normal appearance.  HENT:     Head: Normocephalic and atraumatic.  Eyes:     Conjunctiva/sclera: Conjunctivae normal.  Cardiovascular:     Rate and Rhythm: Normal rate and regular rhythm.  Pulmonary:     Effort: Pulmonary effort is normal. No respiratory distress.     Breath sounds: Normal breath sounds.  Abdominal:     General: There is no distension.     Palpations: Abdomen is soft.     Tenderness: There is no abdominal tenderness.  Skin:    General: Skin is warm and dry.  Neurological:     General: No focal deficit present.     Mental Status: She is alert and oriented to person, place, and time.  Psychiatric:        Attention and Perception: Attention normal.        Mood and Affect: Mood is depressed.        Behavior: Behavior is cooperative.        Thought Content: Thought content includes suicidal ideation.  Thought content includes suicidal plan.     Comments: Speech difficult to understand, answers yes or no questions. Patient is crying.     ED Results / Procedures / Treatments   Labs (all labs ordered are  listed, but only abnormal results are displayed) Labs Reviewed  COMPREHENSIVE METABOLIC PANEL - Abnormal; Notable for the following components:      Result Value   Potassium 3.3 (*)    Glucose, Bld 117 (*)    All other components within normal limits  CBC WITH DIFFERENTIAL/PLATELET - Abnormal; Notable for the following components:   Hemoglobin 15.9 (*)    HCT 46.8 (*)    All other components within normal limits  RESP PANEL BY RT-PCR (FLU A&B, COVID) ARPGX2  ETHANOL  RAPID URINE DRUG SCREEN, HOSP PERFORMED    EKG EKG Interpretation  Date/Time:  Monday January 21 2021 14:31:29 EDT Ventricular Rate:  86 PR Interval:  154 QRS Duration: 62 QT Interval:  360 QTC Calculation: 430 R Axis:   13 Text Interpretation: Normal sinus rhythm Septal infarct , age undetermined Abnormal ECG Confirmed by Noemi Chapel [phone]) on 01/21/2021 2:33:52 PM  Radiology No results found.  Procedures Procedures   Medications Ordered in ED Medications - No data to display  ED Course  I have reviewed the triage vital signs and the nursing notes.  Pertinent labs & imaging results that were available during my care of the patient were reviewed by me and considered in my medical decision making (see chart for details).    MDM Rules/Calculators/A&P                           Patient is 75 y/o female who presents with suicidal ideation. Patient was found at SNF with scissors pointed to her chest. On exam patient is difficult to understand, but is oriented x 3 and can answer yes or no questions. She endorses feelings of depression and suicidal ideation. Endorses chronic left knee and back pain. No other complaints.   Patient's vitals are stable and lab work reassuring. Patient is medically cleared for psych consultation.   Patient discussed and care transferred at shift change to oncoming provider.   Final Clinical Impression(s) / ED Diagnoses Final diagnoses:  Suicidal ideation  Depression,  unspecified depression type    Rx / DC Orders ED Discharge Orders     None        Estill Cotta 01/21/21 1444    Noemi Chapel, MD 01/22/21 [phone]

## 2021-01-21 NOTE — ED Notes (Signed)
Patient taken to family room at this time to be assessed by TTS.

## 2021-01-21 NOTE — ED Provider Notes (Signed)
Emergency Medicine Observation Re-evaluation Note  Courtney Tucker is a 75 y.o. female, seen on rounds today.  Pt initially presented to the ED for complaints of pt tearful, was clutching onto scissors at facility.   Pt initially appeared anxious ?depressed - pt with history same, is on cymbalta, xanax prn. Pt indicates was feeling upset about her chronic illness and issues at facility.   Physical Exam  BP 135/63   Pulse 97   Temp 98.6 F (37 C)   Resp 19   Ht 1.575 m ('5\' 2"'$ )   Wt 75.8 kg   SpO2 92%   BMI 30.54 kg/m  Physical Exam General: calm, alert, nad.  Cardiac: regular rate/ Lungs: breathing comfortably. Psych: alert, content. Pt denies suicidal thoughts or plan - indicates would not harm or injure self.  Pt does not appear to be responding to internal stimuli.   ED Course / MDM    I have reviewed the labs performed to date as well as medications administered while in observation.    Pt initially appeared anxious, tearful. Reassurance provided. Pt also was given a dose of her xanax. Pt also given po fluids/food.   On recheck, calm, less anxious, not tearful.   Plan    Courtney Tucker is not under involuntary commitment.  Pt w challenging situation - has chronic medical issues, hx anxiety/depression, hx Parkinsons, htn, stroke - at baseline in wheelchair, and requires assistance w ADLs - as such, appears pts best option may be return to her facility where familiar with facility/staff/routine and pcp who knows her, continuing her cymbalta and xanax, and close pcp  and [facility] f/u.  Pt currently calm, conversant, without thoughts of suicide, and does not appear acutely psychotic or acute danger to self.   Will provide resource guide, and recommend close pcp f/u to consider med adjustment/management and facilitation of outpatient [place] follow up.        Lajean Saver, MD 01/21/21 1910

## 2021-01-21 NOTE — [facility] Notes (Signed)
Disposition:   Per Pecolia Ades, NP, patient is recommended overnight observation with re-assessment on 01/22/2021.

## 2021-01-21 NOTE — ED Notes (Signed)
Discharge report given to [facility]. Staff from [facility] to come pick pt up.

## 2021-01-22 ENCOUNTER — Encounter: Payer: Self-pay | Admitting: Adult Health

## 2021-01-22 ENCOUNTER — Non-Acute Institutional Stay (SKILLED_NURSING_FACILITY): Payer: Medicare Other | Admitting: Adult Health

## 2021-01-22 DIAGNOSIS — F482 Pseudobulbar affect: Secondary | ICD-10-CM

## 2021-01-22 DIAGNOSIS — F323 Major depressive disorder, single episode, severe with psychotic features: Secondary | ICD-10-CM

## 2021-01-22 NOTE — Progress Notes (Signed)
Location:  [facility] Room Number: 108-D Place of Service:  SNF (31)   CODE STATUS: FULL CODE  Allergies  Allergen Reactions   Oxycodone Other (See Comments)    Can tolerate low dose mixed with tynlelol HALLUCINATIONS    Docosahexaenoic Acid Other (See Comments)   Eicosapentaenoic Acid    Eicosapentaenoic Acid (Epa)    Lutein Nausea And Vomiting   Omega-3 Fatty Acids Other (See Comments)   Other    Aspirin Nausea And Vomiting and Other (See Comments)    Severe stomach pain    Codeine Nausea Only    Makes her feel very sick     Chief Complaint  Patient presents with   Hospitalization Follow-up    Follow up ED    HPI:  She had threatened suicide yesterday. Her plan was to use her scissors. She was taken to the ED for further workup and evaluation. She told the provider at the hospital that she tried to kill herself due to being bullied at this facility. She was crying without tears. She thought her "pacemaker" had been lowered. She was able to stop crying and being hysterical to listen to education about her DBS. She verbalized understanding; then proceeded to become hysterical again.  She is paranoid that people are laughing at her. Staff reports that she is seeing people and animals that are not present.  Today she is back at baseline emotionally.  I have spoken to the psych NP today; will restart neudexta and will start her on low dose seroquel. She told the NP that her mind races at night.   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   PONV (postoperative nausea and vomiting)    states it has been a long time since getting sick   Sleep apnea    Has CPAP but doesnt use   Stroke [facility])    TIA  (transient ischemic attack)    not really TIA but abnormal MRI per pt left sided weakness   UTI (lower urinary tract infection)    Vitamin D deficiency     Past Surgical History:  Procedure Laterality Date   BACK SURGERY     Battery change     DBS, 12/2009   BIOPSY N/A 11/29/2013   Procedure: GASTRIC BIOPSY;  Surgeon: Danie Binder, MD;  Location: [facility];  Service: Endoscopy;  Laterality: N/A;   BURR HOLE W/ STEREOTACTIC INSERTION OF DBS LEADS / INTRAOP MICROELECTRODE RECORDING     2007   CARPAL TUNNEL RELEASE Right    COLONOSCOPY WITH PROPOFOL N/A 11/29/2013   Procedure: COLONOSCOPY WITH PROPOFOL;  Surgeon: Danie Binder, MD;  Location: [facility];  Service: Endoscopy;  Laterality: N/A;  entered cecum @ [phone]; total cecal withdrawal time 21 minutes    ELBOW SURGERY Left    after fx   ESOPHAGEAL DILATION N/A 11/29/2013   Procedure: ESOPHAGEAL DILATION;  Surgeon: Danie Binder, MD;  Location: [facility];  Service: Endoscopy;  Laterality: N/A;  [place] 14/15/16   ESOPHAGOGASTRODUODENOSCOPY (EGD) WITH PROPOFOL N/A 11/29/2013   Procedure: ESOPHAGOGASTRODUODENOSCOPY (EGD) WITH PROPOFOL;  Surgeon: Danie Binder, MD;  Location: [facility];  Service: Endoscopy;  Laterality: N/A;   FOOT SURGERY Left    "something with 2nd 2."   NECK SURGERY     Fusion   POLYPECTOMY N/A 11/29/2013   Procedure: POLYPECTOMY;  Surgeon: Danie Binder, MD;  Location: [facility];  Service: Endoscopy;  Laterality: N/A;  Distal Transverse Colon x2    PR ANALYZE NEUROSTIM BRAIN, FIRST 1H  10/15/2012       PULSE GENERATOR IMPLANT Right 03/30/2018   Procedure: Right Implantable pulse generator change;  Surgeon: Erline Levine, MD;  Location: [place];  Service: Neurosurgery;  Laterality: Right;   SUBTHALAMIC STIMULATOR BATTERY REPLACEMENT N/A 08/05/2013   Procedure: SUBTHALAMIC STIMULATOR BATTERY REPLACEMENT;  Surgeon: Erline Levine, MD;  Location: [place] [facility];  Service: Neurosurgery;  Laterality: N/A;   SUBTHALAMIC STIMULATOR BATTERY REPLACEMENT  N/A 11/26/2015   Procedure: Implantable pulse generator battery change for deep brain stimulator;  Surgeon: Erline Levine, MD;  Location: [place] [facility];  Service: Neurosurgery;  Laterality: N/A;   SUBTHALAMIC STIMULATOR BATTERY REPLACEMENT Right 09/13/2020   Procedure: Right chest implantable pulse generator change for depleted battery;  Surgeon: Erline Levine, MD;  Location: [place];  Service: Neurosurgery;  Laterality: Right;  3C/RM 18   VAGINAL HYSTERECTOMY     "partial"   WRIST SURGERY Right    after fx    Social History   Socioeconomic History   Marital status: Widowed    Spouse name: Not on file   Number of children: Not on file   Years of education: Not on file   Highest education level: Not on file  Occupational History   Occupation: Retired  Tobacco Use   Smoking status: Former    Packs/day: 0.50    Years: 25.00    Pack years: 12.50    Types: Cigarettes    Quit date: 03/01/2019    Years since quitting: 1.8   Smokeless tobacco: Never  Vaping Use   Vaping Use: Never used  Substance and Sexual Activity   Alcohol use: Not Currently   Drug use: No   Sexual activity: Not on file  Other Topics Concern   Not on file  Social History Narrative   She resides at [facility] (assisted living facility).   Social Determinants of Health   Financial Resource Strain: Not on file  Food Insecurity: Not on file  Transportation Needs: Not on file  Physical Activity: Not on file  Stress: Not on file  Social Connections: Not on file  Intimate Partner Violence: Not on file   Family History  Problem Relation Age of Onset   Colon cancer Paternal Uncle       VITAL SIGNS BP 123/67   Pulse 74   Temp 97.6 F (36.4 C)   Resp 18   Ht _0  (1.575 m)   Wt 167 lb 9.6 oz (76 kg)   SpO2 94%   BMI 30.65 kg/m   Outpatient Encounter Medications as of 01/22/2021  Medication Sig Note   acetaminophen (TYLENOL) 500 MG tablet Take 500 mg by mouth every 6 (six) hours as needed for mild pain  or moderate pain.    albuterol (VENTOLIN HFA) 108 (90 Base) MCG/ACT inhaler Inhale 2 puffs into the lungs every 4 (four) hours as needed for wheezing or shortness of breath.    ALPRAZolam (XANAX) 0.25 MG tablet Take 1 tablet (0.25 mg total) by mouth 2 (two) times daily.    alum & mag hydroxide-simeth (MAALOX PLUS) 400-400-40 MG/5ML suspension Take 20 mLs by mouth 2 (two) times  daily as needed for indigestion.    amLODipine (NORVASC) 10 MG tablet Take 10 mg by mouth daily.     atorvastatin (LIPITOR) 40 MG tablet Take 1 tablet (40 mg total) by mouth daily.    BREZTRI AEROSPHERE 160-9-4.8 MCG/ACT AERO Inhale 2 puffs into the lungs 2 (two) times daily.    carbidopa-levodopa (SINEMET IR) 25-100 MG tablet Take 1 tablet by mouth 2 (two) times daily.    Cholecalciferol 50 MCG (2000 UT) CAPS     clopidogrel (PLAVIX) 75 MG tablet Take 75 mg by mouth daily.    Dextromethorphan-quiNIDine (NUEDEXTA) 20-10 MG capsule Take 1 capsule by mouth 2 (two) times daily. 9am & 9pm    docusate sodium (COLACE) 100 MG capsule Take 100 mg by mouth daily.    donepezil (ARICEPT) 5 MG tablet Take 5 mg by mouth at bedtime. 8 pm for parkinson's dementia    DULoxetine (CYMBALTA) 60 MG capsule Take 60 mg by mouth daily.    guaiFENesin (MUCINEX) 600 MG 12 hr tablet Take 600 mg by mouth daily as needed.    losartan (COZAAR) 100 MG tablet Take 100 mg by mouth daily.    meloxicam (MOBIC) 7.5 MG tablet Take 7.5 mg by mouth daily.    miconazole (ZEASORB-AF) 2 % powder Apply 1 application topically as needed for itching (Every Shift - PRN; PRN 1, PRN 2, PRN 3).    omeprazole (PRILOSEC) 20 MG capsule Take 20 mg by mouth in the morning and at bedtime.     oxybutynin (DITROPAN) 5 MG tablet Take 5 mg by mouth 2 (two) times daily.    polyethylene glycol (MIRALAX / GLYCOLAX) 17 g packet Take 17 g by mouth daily as needed.    pramipexole (MIRAPEX) 1 MG tablet Take 1 mg by mouth at bedtime.    QUEtiapine (SEROQUEL) 25 MG tablet Take 12.5 mg by  mouth daily.    traZODone (DESYREL) 100 MG tablet Take 100 mg by mouth at bedtime.    [DISCONTINUED] busPIRone (BUSPAR) 7.5 MG tablet Take 7.5 mg by mouth 2 (two) times daily. For major depressive disorder, recurrent (Patient not taking: Reported on 01/21/2021)    [DISCONTINUED] Cholecalciferol (VITAMIN D) 50 MCG (2000 UT) tablet Take 2,000 Units by mouth daily.    [DISCONTINUED] HYDROcodone-acetaminophen (NORCO/VICODIN) 5-325 MG tablet Take 1 tablet by mouth every 6 (six) hours as needed for moderate pain. 01/21/2021: D/C on 01/16/21   [DISCONTINUED] traMADol (ULTRAM) 50 MG tablet     [DISCONTINUED] UNABLE TO FIND Diet Change: Dys 3 (chopped) diet, continue thin liquids; will allow pizza if requested    No facility-administered encounter medications on file as of 01/22/2021.     SIGNIFICANT DIAGNOSTIC EXAMS   LABS REVIEWED; PREVIOUS   05-10-20: wbc 6.2; hgb 14.8; hct 44.7; mcv 94.9 plt 251; glucose 122; bun 14; creat 0.67; k+ 3.5; na++ 140 ca 8.9 GFR>60; alk phos 146; albumin 3.9  07-27-20: wbc 6.9; hgb 13.8; hct 41.9; mcv 95.2 plt 221; glucose 111; bun 19; creat 0.82; k+ 3.4; na++ 139; ca 8.9; GFR>60; liver normal albumin 3.6; chol 110; ldl 45; trig 107 hdl 44 07-30-20: k+ 3.6  09-13-20: wbc 8.3; hgb 17.2; hct 52.6 mcv 95.8 plt 248; glucose 122; bun 15; creat 0.86; k+ 4.5; na++ 139; ca 9.9; GFR>60  12-13-20: urine culture multiple species  TODAY  01-17-21: wbc 7.1; hgb 14.8; hct 43.5; mcv 93.8 plt 239 01-21-21: wbc 7.7; hgb 15.9; hct 46.8; mcv 92.3 plt 260; glucose 117; bun 13; creat 0.80;  k+ 3.3; na++ 138; ca 9.2; GFR>60 liver normal albumin 4.2   Review of Systems  Constitutional:  Negative for malaise/fatigue.  Respiratory:  Negative for cough and shortness of breath.   Cardiovascular:  Negative for chest pain, palpitations and leg swelling.  Gastrointestinal:  Negative for abdominal pain, constipation and heartburn.  Musculoskeletal:  Negative for back pain, joint pain and myalgias.   Skin: Negative.   Neurological:  Negative for dizziness.  Psychiatric/Behavioral:  Positive for depression. The patient is nervous/anxious.    Physical Exam Constitutional:      General: She is not in acute distress.    Appearance: She is well-developed. She is not diaphoretic.  Neck:     Thyroid: No thyromegaly.  Cardiovascular:     Rate and Rhythm: Normal rate and regular rhythm.     Pulses: Normal pulses.     Heart sounds: Normal heart sounds.  Pulmonary:     Effort: Pulmonary effort is normal. No respiratory distress.     Breath sounds: Normal breath sounds.  Abdominal:     General: Bowel sounds are normal. There is no distension.     Palpations: Abdomen is soft.     Tenderness: There is no abdominal tenderness.  Musculoskeletal:     Cervical back: Neck supple.     Right lower leg: No edema.     Left lower leg: No edema.     Comments:  Has left side weakness Leans head to the left  Bilateral hand tremor left >right    Lymphadenopathy:     Cervical: No cervical adenopathy.  Skin:    General: Skin is warm and dry.  Neurological:     Mental Status: She is alert. Mental status is at baseline.  Psychiatric:        Mood and Affect: Mood normal.     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 Will monitor her status.    Ok Edwards NP [facility]  Contact [phone] Monday through Friday 8am- 5pm  After hours call [phone]

## 2021-01-23 DIAGNOSIS — F323 Major depressive disorder, single episode, severe with psychotic features: Secondary | ICD-10-CM | POA: Insufficient documentation

## 2021-01-24 ENCOUNTER — Encounter (HOSPITAL_COMMUNITY)
Admission: RE | Admit: 2021-01-24 | Discharge: 2021-01-24 | Disposition: A | Discharge status: Home / Self Care | Payer: Medicare Other | Source: Skilled Nursing Facility | Attending: Adult Health | Admitting: Adult Health

## 2021-01-24 ENCOUNTER — Non-Acute Institutional Stay (SKILLED_NURSING_FACILITY): Payer: Medicare Other | Admitting: Adult Health

## 2021-01-24 ENCOUNTER — Encounter: Payer: Self-pay | Admitting: Adult Health

## 2021-01-24 DIAGNOSIS — G20 Parkinson's disease: Secondary | ICD-10-CM

## 2021-01-24 DIAGNOSIS — F323 Major depressive disorder, single episode, severe with psychotic features: Secondary | ICD-10-CM | POA: Diagnosis not present

## 2021-01-24 DIAGNOSIS — F0281 Dementia in other diseases classified elsewhere with behavioral disturbance: Secondary | ICD-10-CM | POA: Diagnosis not present

## 2021-01-24 DIAGNOSIS — I10 Essential (primary) hypertension: Secondary | ICD-10-CM | POA: Insufficient documentation

## 2021-01-24 LAB — BASIC METABOLIC PANEL
Anion gap: 5 (ref 5–15)
BUN: 23 mg/dL (ref 8–23)
CO2: 29 mmol/L (ref 22–32)
Calcium: 8.8 mg/dL — ABNORMAL LOW (ref 8.9–10.3)
Chloride: 103 mmol/L (ref 98–111)
Creatinine, Ser: 1.01 mg/dL — ABNORMAL HIGH (ref 0.44–1.00)
GFR, Estimated: 58 mL/min — ABNORMAL LOW (ref >60)
Glucose, Bld: 107 mg/dL — ABNORMAL HIGH (ref 70–99)
Potassium: 3.4 mmol/L — ABNORMAL LOW (ref 3.5–5.1)
Sodium: 137 mmol/L (ref 135–145)

## 2021-01-24 NOTE — Progress Notes (Signed)
Location:  [place] Room Number: 108-D Place of Service:  SNF (31)   CODE STATUS: Full code  Allergies  Allergen Reactions   Oxycodone Other (See Comments)    Can tolerate low dose mixed with tynlelol HALLUCINATIONS    Docosahexaenoic Acid Other (See Comments)   Eicosapentaenoic Acid    Eicosapentaenoic Acid (Epa)    Lutein Nausea And Vomiting   Omega-3 Fatty Acids Other (See Comments)   Other    Aspirin Nausea And Vomiting and Other (See Comments)    Severe stomach pain    Codeine Nausea Only    Makes her feel very sick     Chief Complaint  Patient presents with   Acute Visit    Care plan meeting    HPI:  We have come together for her care plan meeting. Guardian present.  BIMS 14/15 mood 4/30: anxious decreased energy. No falls; her appetite is fair. Is nonambulatory. She was taken to the ED on 01-21-21 for suicidal ideation. She has returned back to the facility. She was seen by the psych services NP. She was started on seroquel 12.5 mg in the evening. Her mirapex was lowered. Was started on neudexta for PBA. Her EKG did not demonstrate any significant abnormalities. I have spoken with the psych NP: will need to consider in the future of continuing to increase seroquel and lowering cymbalta.  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   PONV (postoperative nausea and vomiting)    states it has been a long time since getting sick   Sleep apnea    Has CPAP but doesnt use   Stroke ([place])    TIA (transient ischemic  attack)    not really TIA but abnormal MRI per pt left sided weakness   UTI (lower urinary tract infection)    Vitamin D deficiency     Past Surgical History:  Procedure Laterality Date   BACK SURGERY     Battery change     DBS, 12/2009   BIOPSY N/A 11/29/2013   Procedure: GASTRIC BIOPSY;  Surgeon: Danie Binder, MD;  Location: [facility];  Service: Endoscopy;  Laterality: N/A;   BURR HOLE W/ STEREOTACTIC INSERTION OF DBS LEADS / INTRAOP MICROELECTRODE RECORDING     2007   CARPAL TUNNEL RELEASE Right    COLONOSCOPY WITH PROPOFOL N/A 11/29/2013   Procedure: COLONOSCOPY WITH PROPOFOL;  Surgeon: Danie Binder, MD;  Location: [facility];  Service: Endoscopy;  Laterality: N/A;  entered cecum @ [phone]; total cecal withdrawal time 21 minutes    ELBOW SURGERY Left    after fx   ESOPHAGEAL DILATION N/A 11/29/2013   Procedure: ESOPHAGEAL DILATION;  Surgeon: Danie Binder, MD;  Location: [facility];  Service: Endoscopy;  Laterality: N/A;  [place] 14/15/16   ESOPHAGOGASTRODUODENOSCOPY (EGD) WITH PROPOFOL N/A 11/29/2013   Procedure: ESOPHAGOGASTRODUODENOSCOPY (EGD) WITH PROPOFOL;  Surgeon: Danie Binder, MD;  Location: [facility];  Service: Endoscopy;  Laterality: N/A;   FOOT SURGERY Left    "something  with 2nd 2."   NECK SURGERY     Fusion   POLYPECTOMY N/A 11/29/2013   Procedure: POLYPECTOMY;  Surgeon: Danie Binder, MD;  Location: [facility];  Service: Endoscopy;  Laterality: N/A;  Distal Transverse Colon x2    PR ANALYZE NEUROSTIM BRAIN, FIRST 1H  10/15/2012       PULSE GENERATOR IMPLANT Right 03/30/2018   Procedure: Right Implantable pulse generator change;  Surgeon: Erline Levine, MD;  Location: [place];  Service: Neurosurgery;  Laterality: Right;   SUBTHALAMIC STIMULATOR BATTERY REPLACEMENT N/A 08/05/2013   Procedure: SUBTHALAMIC STIMULATOR BATTERY REPLACEMENT;  Surgeon: Erline Levine, MD;  Location: [place] [facility];  Service: Neurosurgery;  Laterality: N/A;   SUBTHALAMIC STIMULATOR BATTERY REPLACEMENT N/A 11/26/2015    Procedure: Implantable pulse generator battery change for deep brain stimulator;  Surgeon: Erline Levine, MD;  Location: [place] [facility];  Service: Neurosurgery;  Laterality: N/A;   SUBTHALAMIC STIMULATOR BATTERY REPLACEMENT Right 09/13/2020   Procedure: Right chest implantable pulse generator change for depleted battery;  Surgeon: Erline Levine, MD;  Location: [place];  Service: Neurosurgery;  Laterality: Right;  3C/RM 18   VAGINAL HYSTERECTOMY     "partial"   WRIST SURGERY Right    after fx    Social History   Socioeconomic History   Marital status: Widowed    Spouse name: Not on file   Number of children: Not on file   Years of education: Not on file   Highest education level: Not on file  Occupational History   Occupation: Retired  Tobacco Use   Smoking status: Former    Packs/day: 0.50    Years: 25.00    Pack years: 12.50    Types: Cigarettes    Quit date: 03/01/2019    Years since quitting: 1.9   Smokeless tobacco: Never  Vaping Use   Vaping Use: Never used  Substance and Sexual Activity   Alcohol use: Not Currently   Drug use: No   Sexual activity: Not on file  Other Topics Concern   Not on file  Social History Narrative   She resides at [facility] (assisted living facility).   Social Determinants of Health   Financial Resource Strain: Not on file  Food Insecurity: Not on file  Transportation Needs: Not on file  Physical Activity: Not on file  Stress: Not on file  Social Connections: Not on file  Intimate Partner Violence: Not on file   Family History  Problem Relation Age of Onset   Colon cancer Paternal Uncle       VITAL SIGNS BP 123/67   Pulse 74   Temp 97.7 F (36.5 C)   Resp 18   Ht 5' 2"  (1.575 m)   Wt 167 lb 9.6 oz (76 kg)   SpO2 94%   BMI 30.65 kg/m   Outpatient Encounter Medications as of 01/24/2021  Medication Sig   acetaminophen (TYLENOL) 500 MG tablet Take 500 mg by mouth every 6 (six) hours as needed for mild pain or moderate pain.    albuterol (VENTOLIN HFA) 108 (90 Base) MCG/ACT inhaler Inhale 2 puffs into the lungs every 4 (four) hours as needed for wheezing or shortness of breath.   ALPRAZolam (XANAX) 0.25 MG tablet Take 1 tablet (0.25 mg total) by mouth 2 (two) times daily.   alum & mag hydroxide-simeth (MAALOX PLUS) 400-400-40 MG/5ML suspension Take 20 mLs by mouth 2 (two) times daily as needed for indigestion.   amLODipine (NORVASC) 10 MG tablet Take 10 mg  by mouth daily.    atorvastatin (LIPITOR) 40 MG tablet Take 1 tablet (40 mg total) by mouth daily.   BREZTRI AEROSPHERE 160-9-4.8 MCG/ACT AERO Inhale 2 puffs into the lungs 2 (two) times daily.   carbidopa-levodopa (SINEMET IR) 25-100 MG tablet Take 1 tablet by mouth 2 (two) times daily.   Cholecalciferol 50 MCG (2000 UT) CAPS    clopidogrel (PLAVIX) 75 MG tablet Take 75 mg by mouth daily.   Dextromethorphan-quiNIDine (NUEDEXTA) 20-10 MG capsule Take 1 capsule by mouth 2 (two) times daily. 9am & 9pm   docusate sodium (COLACE) 100 MG capsule Take 100 mg by mouth daily.   donepezil (ARICEPT) 5 MG tablet Take 5 mg by mouth at bedtime. 8 pm for parkinson's dementia   DULoxetine (CYMBALTA) 60 MG capsule Take 60 mg by mouth daily.   guaiFENesin (MUCINEX) 600 MG 12 hr tablet Take 600 mg by mouth daily as needed.   losartan (COZAAR) 100 MG tablet Take 100 mg by mouth daily.   meloxicam (MOBIC) 7.5 MG tablet Take 7.5 mg by mouth daily.   miconazole (ZEASORB-AF) 2 % powder Apply 1 application topically as needed for itching (Every Shift - PRN; PRN 1, PRN 2, PRN 3).   NON FORMULARY Diet: Liquids: regular   omeprazole (PRILOSEC) 20 MG capsule Take 20 mg by mouth in the morning and at bedtime.    oxybutynin (DITROPAN) 5 MG tablet Take 5 mg by mouth 2 (two) times daily.   polyethylene glycol (MIRALAX / GLYCOLAX) 17 g packet Take 17 g by mouth daily as needed.   pramipexole (MIRAPEX) 1 MG tablet Take 1 mg by mouth at bedtime.   QUEtiapine (SEROQUEL) 25 MG tablet Take 12.5 mg by  mouth daily.   traZODone (DESYREL) 100 MG tablet Take 100 mg by mouth at bedtime.   No facility-administered encounter medications on file as of 01/24/2021.     SIGNIFICANT DIAGNOSTIC EXAMS   LABS REVIEWED; PREVIOUS   05-10-20: wbc 6.2; hgb 14.8; hct 44.7; mcv 94.9 plt 251; glucose 122; bun 14; creat 0.67; k+ 3.5; na++ 140 ca 8.9 GFR>60; alk phos 146; albumin 3.9  07-27-20: wbc 6.9; hgb 13.8; hct 41.9; mcv 95.2 plt 221; glucose 111; bun 19; creat 0.82; k+ 3.4; na++ 139; ca 8.9; GFR>60; liver normal albumin 3.6; chol 110; ldl 45; trig 107 hdl 44 07-30-20: k+ 3.6  09-13-20: wbc 8.3; hgb 17.2; hct 52.6 mcv 95.8 plt 248; glucose 122; bun 15; creat 0.86; k+ 4.5; na++ 139; ca 9.9; GFR>60  12-13-20: urine culture multiple species 01-17-21: wbc 7.1; hgb 14.8; hct 43.5; mcv 93.8 plt 239 01-21-21: wbc 7.7; hgb 15.9; hct 46.8; mcv 92.3 plt 260; glucose 117; bun 13; creat 0.80; k+ 3.3; na++ 138; ca 9.2; GFR>60 liver normal albumin 4.2   NO NEW LABS.   Review of Systems  Constitutional:  Negative for malaise/fatigue.  Eyes:  Positive for double vision.  Respiratory:  Negative for cough and shortness of breath.   Cardiovascular:  Negative for chest pain, palpitations and leg swelling.  Gastrointestinal:  Negative for abdominal pain, constipation and heartburn.  Musculoskeletal:  Negative for back pain, joint pain and myalgias.  Skin: Negative.   Neurological:  Positive for tremors. Negative for dizziness.  Psychiatric/Behavioral:  Positive for depression. The patient is nervous/anxious and has insomnia.     Physical Exam Constitutional:      General: She is not in acute distress.    Appearance: She is well-developed. She is obese. She is not diaphoretic.  Neck:     Thyroid: No thyromegaly.  Cardiovascular:     Rate and Rhythm: Normal rate and regular rhythm.     Pulses: Normal pulses.     Heart sounds: Normal heart sounds.  Pulmonary:     Effort: Pulmonary effort is normal. No respiratory  distress.     Breath sounds: Normal breath sounds.  Abdominal:     General: Bowel sounds are normal. There is no distension.     Palpations: Abdomen is soft.     Tenderness: There is no abdominal tenderness.  Musculoskeletal:     Cervical back: Neck supple.     Right lower leg: No edema.     Left lower leg: No edema.     Comments:  Has left side weakness Leans head to the left  Bilateral hand tremor left >right     Lymphadenopathy:     Cervical: No cervical adenopathy.  Skin:    General: Skin is warm and dry.  Neurological:     Mental Status: She is alert. Mental status is at baseline.  Psychiatric:        Mood and Affect: Mood normal.     ASSESSMENT/ PLAN:  TODAY  Parkinsons dementia with behavioral disturbance Parkinsons disease Severe major depression with psychotic features  Will setup a palliative care consult Will continue current medications Will continue to monitor her status.   Time spent with patient 40 minutes: goals of care; mental health status; medications.    Ok Edwards NP [facility]  Contact [phone] Monday through Friday 8am- 5pm  After hours call [phone]

## 2021-01-29 ENCOUNTER — Encounter: Payer: Self-pay | Admitting: Adult Health

## 2021-01-29 ENCOUNTER — Non-Acute Institutional Stay (SKILLED_NURSING_FACILITY): Payer: Medicare Other | Admitting: Adult Health

## 2021-01-29 DIAGNOSIS — G20 Parkinson's disease: Secondary | ICD-10-CM

## 2021-01-29 DIAGNOSIS — F0281 Dementia in other diseases classified elsewhere with behavioral disturbance: Secondary | ICD-10-CM | POA: Diagnosis not present

## 2021-01-29 NOTE — Progress Notes (Signed)
Location:  [place] Room Number: 108-D Place of Service:  SNF (31)   CODE STATUS: Full Code   Allergies  Allergen Reactions   Oxycodone Other (See Comments)    Can tolerate low dose mixed with tynlelol HALLUCINATIONS    Docosahexaenoic Acid Other (See Comments)   Eicosapentaenoic Acid    Eicosapentaenoic Acid (Epa)    Lutein Nausea And Vomiting   Omega-3 Fatty Acids Other (See Comments)   Other    Aspirin Nausea And Vomiting and Other (See Comments)    Severe stomach pain    Codeine Nausea Only    Makes her feel very sick     Chief Complaint  Patient presents with   Acute Visit    Patient is refusing meals    HPI:  Staff report that she has declined 2 meals over the past weekend. She tells me that she is not hungry. She has had food brought into her. She tells me that she is eating what she wants to eat. She denies any nausea or vomiting. She denies any constipation.   Past Medical History:  Diagnosis Date   Anxiety    Arthritis    Complication of anesthesia    Depression    Dysphasia    GERD (gastroesophageal reflux disease)    Hyperlipidemia    Hypertension    Parkinson's disease ([place])    diagnosed at age 41   Parkinson's disease dementia ([place]) 07/31/2020   PONV (postoperative nausea and vomiting)    states it has been a long time since getting sick   Sleep apnea    Has CPAP but doesnt use   Stroke [facility])    TIA (transient ischemic attack)    not really TIA but abnormal MRI per pt left sided weakness   UTI (lower urinary tract infection)    Vitamin D deficiency     Past Surgical History:  Procedure Laterality Date   BACK SURGERY     Battery change     DBS, 12/2009   BIOPSY N/A 11/29/2013   Procedure: GASTRIC BIOPSY;  Surgeon: Danie Binder, MD;  Location: [facility] ORS;  Service: Endoscopy;  Laterality: N/A;   BURR HOLE W/ STEREOTACTIC INSERTION OF DBS LEADS / INTRAOP MICROELECTRODE RECORDING     2007   CARPAL TUNNEL RELEASE Right     COLONOSCOPY WITH PROPOFOL N/A 11/29/2013   Procedure: COLONOSCOPY WITH PROPOFOL;  Surgeon: Danie Binder, MD;  Location: [facility];  Service: Endoscopy;  Laterality: N/A;  entered cecum @ [phone]; total cecal withdrawal time 21 minutes    ELBOW SURGERY Left    after fx   ESOPHAGEAL DILATION N/A 11/29/2013   Procedure: ESOPHAGEAL DILATION;  Surgeon: Danie Binder, MD;  Location: [facility];  Service: Endoscopy;  Laterality: N/A;  [place] 14/15/16   ESOPHAGOGASTRODUODENOSCOPY (EGD) WITH PROPOFOL N/A 11/29/2013   Procedure: ESOPHAGOGASTRODUODENOSCOPY (EGD) WITH PROPOFOL;  Surgeon: Danie Binder, MD;  Location: [facility];  Service: Endoscopy;  Laterality: N/A;   FOOT SURGERY Left    "something with 2nd 2."   NECK SURGERY     Fusion   POLYPECTOMY N/A 11/29/2013   Procedure: POLYPECTOMY;  Surgeon: Danie Binder, MD;  Location: [facility];  Service: Endoscopy;  Laterality: N/A;  Distal Transverse Colon x2    PR ANALYZE NEUROSTIM BRAIN, FIRST 1H  10/15/2012       PULSE GENERATOR IMPLANT Right 03/30/2018   Procedure: Right Implantable pulse generator change;  Surgeon: Erline Levine, MD;  Location: [place] OR;  Service: Neurosurgery;  Laterality: Right;   SUBTHALAMIC STIMULATOR BATTERY REPLACEMENT N/A 08/05/2013   Procedure: SUBTHALAMIC STIMULATOR BATTERY REPLACEMENT;  Surgeon: Erline Levine, MD;  Location: [place] NEURO ORS;  Service: Neurosurgery;  Laterality: N/A;   SUBTHALAMIC STIMULATOR BATTERY REPLACEMENT N/A 11/26/2015   Procedure: Implantable pulse generator battery change for deep brain stimulator;  Surgeon: Erline Levine, MD;  Location: [place] NEURO ORS;  Service: Neurosurgery;  Laterality: N/A;   SUBTHALAMIC STIMULATOR BATTERY REPLACEMENT Right 09/13/2020   Procedure: Right chest implantable pulse generator change for depleted battery;  Surgeon: Erline Levine, MD;  Location: [place];  Service: Neurosurgery;  Laterality: Right;  3C/RM 18   VAGINAL HYSTERECTOMY     "partial"   WRIST SURGERY Right    after fx    Social History    Socioeconomic History   Marital status: Widowed    Spouse name: Not on file   Number of children: Not on file   Years of education: Not on file   Highest education level: Not on file  Occupational History   Occupation: Retired  Tobacco Use   Smoking status: Former    Packs/day: 0.50    Years: 25.00    Pack years: 12.50    Types: Cigarettes    Quit date: 03/01/2019    Years since quitting: 1.9   Smokeless tobacco: Never  Vaping Use   Vaping Use: Never used  Substance and Sexual Activity   Alcohol use: Not Currently   Drug use: No   Sexual activity: Not on file  Other Topics Concern   Not on file  Social History Narrative   She resides at [facility] (assisted living facility).   Social Determinants of Health   Financial Resource Strain: Not on file  Food Insecurity: Not on file  Transportation Needs: Not on file  Physical Activity: Not on file  Stress: Not on file  Social Connections: Not on file  Intimate Partner Violence: Not on file   Family History  Problem Relation Age of Onset   Colon cancer Paternal Uncle       VITAL SIGNS BP 130/79   Pulse 66   Temp (!) 97.3 F (36.3 C)   Resp (!) 22   Ht $R'5\' 2"'iP$  (1.575 m)   Wt 161 lb 6.4 oz (73.2 kg)   SpO2 97%   BMI 29.52 kg/m   Outpatient Encounter Medications as of 01/29/2021  Medication Sig   acetaminophen (TYLENOL) 500 MG tablet Take 500 mg by mouth every 6 (six) hours as needed for mild pain or moderate pain.   albuterol (VENTOLIN HFA) 108 (90 Base) MCG/ACT inhaler Inhale 2 puffs into the lungs every 4 (four) hours as needed for wheezing or shortness of breath.   ALPRAZolam (XANAX) 0.25 MG tablet Take 1 tablet (0.25 mg total) by mouth 2 (two) times daily.   alum & mag hydroxide-simeth (MAALOX PLUS) 400-400-40 MG/5ML suspension Take 20 mLs by mouth 2 (two) times daily as needed for indigestion.   amLODipine (NORVASC) 10 MG tablet Take 10 mg by mouth daily.    atorvastatin (LIPITOR) 40 MG tablet Take 1 tablet  (40 mg total) by mouth daily.   BREZTRI AEROSPHERE 160-9-4.8 MCG/ACT AERO Inhale 2 puffs into the lungs 2 (two) times daily.   carbidopa-levodopa (SINEMET IR) 25-100 MG tablet Take 1 tablet by mouth 2 (two) times daily.   Cholecalciferol 50 MCG (2000 UT) CAPS Take 1 capsule by mouth daily.   clopidogrel (PLAVIX) 75 MG tablet Take  75 mg by mouth daily.   Dextromethorphan-quiNIDine (NUEDEXTA) 20-10 MG capsule Take 1 capsule by mouth daily.   docusate sodium (COLACE) 100 MG capsule Take 100 mg by mouth daily.   donepezil (ARICEPT) 5 MG tablet Take 5 mg by mouth at bedtime. 8 pm for parkinson's dementia   DULoxetine (CYMBALTA) 60 MG capsule Take 60 mg by mouth daily.   Ensure (ENSURE) Take 120 mLs by mouth 2 (two) times daily between meals. 9 am and 9 pm   guaiFENesin (MUCINEX) 600 MG 12 hr tablet Take 600 mg by mouth daily as needed.   losartan (COZAAR) 100 MG tablet Take 100 mg by mouth daily.   meloxicam (MOBIC) 7.5 MG tablet Take 7.5 mg by mouth daily.   miconazole (ZEASORB-AF) 2 % powder Apply 1 application topically as needed for itching (Every Shift - PRN; PRN 1, PRN 2, PRN 3).   NON FORMULARY Diet: Liquids   omeprazole (PRILOSEC) 20 MG capsule Take 20 mg by mouth in the morning and at bedtime.    oxybutynin (DITROPAN) 5 MG tablet Take 5 mg by mouth 2 (two) times daily.   polyethylene glycol (MIRALAX / GLYCOLAX) 17 g packet Take 17 g by mouth daily as needed.   pramipexole (MIRAPEX) 0.5 MG tablet Take 0.5 mg by mouth at bedtime.   QUEtiapine (SEROQUEL) 25 MG tablet Take 12.5 mg by mouth daily.   traZODone (DESYREL) 100 MG tablet Take 100 mg by mouth at bedtime.   [DISCONTINUED] pramipexole (MIRAPEX) 1 MG tablet Take 1 mg by mouth at bedtime.   No facility-administered encounter medications on file as of 01/29/2021.     SIGNIFICANT DIAGNOSTIC EXAMS   LABS REVIEWED; PREVIOUS   05-10-20: wbc 6.2; hgb 14.8; hct 44.7; mcv 94.9 plt 251; glucose 122; bun 14; creat 0.67; k+ 3.5; na++ 140 ca  8.9 GFR>60; alk phos 146; albumin 3.9  07-27-20: wbc 6.9; hgb 13.8; hct 41.9; mcv 95.2 plt 221; glucose 111; bun 19; creat 0.82; k+ 3.4; na++ 139; ca 8.9; GFR>60; liver normal albumin 3.6; chol 110; ldl 45; trig 107 hdl 44 07-30-20: k+ 3.6  09-13-20: wbc 8.3; hgb 17.2; hct 52.6 mcv 95.8 plt 248; glucose 122; bun 15; creat 0.86; k+ 4.5; na++ 139; ca 9.9; GFR>60  12-13-20: urine culture multiple species 01-17-21: wbc 7.1; hgb 14.8; hct 43.5; mcv 93.8 plt 239 01-21-21: wbc 7.7; hgb 15.9; hct 46.8; mcv 92.3 plt 260; glucose 117; bun 13; creat 0.80; k+ 3.3; na++ 138; ca 9.2; GFR>60 liver normal albumin 4.2   NO NEW LABS.   Review of Systems  Constitutional:  Negative for malaise/fatigue.  Respiratory:  Negative for cough and shortness of breath.   Cardiovascular:  Negative for chest pain, palpitations and leg swelling.  Gastrointestinal:  Negative for abdominal pain, constipation and heartburn.  Musculoskeletal:  Negative for back pain, joint pain and myalgias.  Skin: Negative.   Neurological:  Negative for dizziness.  Psychiatric/Behavioral:  The patient is not nervous/anxious.    Physical Exam Constitutional:      General: She is not in acute distress.    Appearance: She is well-developed. She is not diaphoretic.  Neck:     Thyroid: No thyromegaly.  Cardiovascular:     Rate and Rhythm: Normal rate and regular rhythm.     Pulses: Normal pulses.     Heart sounds: Normal heart sounds.  Pulmonary:     Effort: Pulmonary effort is normal. No respiratory distress.     Breath sounds: Normal breath sounds.  Abdominal:     General: Bowel sounds are normal. There is no distension.     Palpations: Abdomen is soft.     Tenderness: There is no abdominal tenderness.  Musculoskeletal:     Cervical back: Neck supple.     Right lower leg: No edema.     Left lower leg: No edema.     Comments:  Has left side weakness Leans head to the left  Bilateral hand tremor left >right     Lymphadenopathy:      Cervical: No cervical adenopathy.  Skin:    General: Skin is warm and dry.  Neurological:     Mental Status: She is alert. Mental status is at baseline.  Psychiatric:        Mood and Affect: Mood normal.     ASSESSMENT/ PLAN:  TODAY  Parkinson's dementia with behavioral disturbance: is without change will not make changes at this time. Will monitor her status and will monitor her weight per protocol.    Ok Edwards NP [facility]  Contact [phone] Monday through Friday 8am- 5pm  After hours call [phone]

## 2021-02-05 ENCOUNTER — Non-Acute Institutional Stay (SKILLED_NURSING_FACILITY): Payer: Medicare Other | Admitting: Adult Health

## 2021-02-05 ENCOUNTER — Encounter: Payer: Self-pay | Admitting: Adult Health

## 2021-02-05 ENCOUNTER — Other Ambulatory Visit: Payer: Self-pay | Admitting: Adult Health

## 2021-02-05 DIAGNOSIS — G20 Parkinson's disease: Secondary | ICD-10-CM | POA: Diagnosis not present

## 2021-02-05 DIAGNOSIS — F339 Major depressive disorder, recurrent, unspecified: Secondary | ICD-10-CM

## 2021-02-05 DIAGNOSIS — F0281 Dementia in other diseases classified elsewhere with behavioral disturbance: Secondary | ICD-10-CM

## 2021-02-05 MED ORDER — CLONAZEPAM 0.5 MG PO TABS: 0.5000 mg | ORAL_TABLET | Freq: Every day | ORAL | 0 refills | Status: DC

## 2021-02-05 NOTE — Progress Notes (Signed)
Location:  [place] Room Number: 108-D Place of Service:  SNF (31)   CODE STATUS: Full Code  Allergies  Allergen Reactions   Oxycodone Other (See Comments)    Can tolerate low dose mixed with tynlelol HALLUCINATIONS    Docosahexaenoic Acid Other (See Comments)   Eicosapentaenoic Acid    Eicosapentaenoic Acid (Epa)    Lutein Nausea And Vomiting   Omega-3 Fatty Acids Other (See Comments)   Other    Aspirin Nausea And Vomiting and Other (See Comments)    Severe stomach pain    Codeine Nausea Only    Makes her feel very sick     Chief Complaint  Patient presents with   Acute Visit    Acute concerns     HPI:  Her neurology provider has called. We have discussed therapy and ability for her to participate. At this time there is no obtainable goal. They would like for her to be on klonopin instead of her xanax 0.25 mg nightly. The thought is the klonopin will help her sleep throughout the night and will relieve the night terrors.  She has not done well in the past with lowering her xanax; however; that being said we will stop the xanax and will begin klonopin 0.5 mg nightly   Past Medical History:  Diagnosis Date   Anxiety    Arthritis    Complication of anesthesia    Depression    Dysphasia    GERD (gastroesophageal reflux disease)    Hyperlipidemia    Hypertension    Parkinson's disease ([place])    diagnosed at age 56   Parkinson's disease dementia ([place]) 07/31/2020   PONV (postoperative nausea and vomiting)    states it has been a long time since getting sick   Sleep apnea    Has CPAP but doesnt use   Stroke ([place])    TIA (transient ischemic attack)    not really TIA but abnormal MRI per pt left sided weakness   UTI (lower urinary tract infection)    Vitamin D deficiency     Past Surgical History:  Procedure Laterality Date   BACK SURGERY     Battery change     DBS, 12/2009   BIOPSY N/A 11/29/2013   Procedure: GASTRIC BIOPSY;  Surgeon: Danie Binder, MD;  Location: [facility];  Service: Endoscopy;  Laterality: N/A;   BURR HOLE W/ STEREOTACTIC INSERTION OF DBS LEADS / INTRAOP MICROELECTRODE RECORDING     2007   CARPAL TUNNEL RELEASE Right    COLONOSCOPY WITH PROPOFOL N/A 11/29/2013   Procedure: COLONOSCOPY WITH PROPOFOL;  Surgeon: Danie Binder, MD;  Location: [facility];  Service: Endoscopy;  Laterality: N/A;  entered cecum @ [phone]; total cecal withdrawal time 21 minutes    ELBOW SURGERY Left    after fx   ESOPHAGEAL DILATION N/A 11/29/2013   Procedure: ESOPHAGEAL DILATION;  Surgeon: Danie Binder, MD;  Location: [facility];  Service: Endoscopy;  Laterality: N/A;  [place] 14/15/16   ESOPHAGOGASTRODUODENOSCOPY (EGD) WITH PROPOFOL N/A 11/29/2013   Procedure: ESOPHAGOGASTRODUODENOSCOPY (EGD) WITH PROPOFOL;  Surgeon: Danie Binder, MD;  Location: [facility];  Service: Endoscopy;  Laterality: N/A;   FOOT SURGERY Left    "something with 2nd 2."   NECK SURGERY     Fusion   POLYPECTOMY N/A 11/29/2013   Procedure: POLYPECTOMY;  Surgeon: Danie Binder, MD;  Location: [facility];  Service: Endoscopy;  Laterality: N/A;  Distal Transverse Colon x2  PR ANALYZE NEUROSTIM BRAIN, FIRST 1H  10/15/2012       PULSE GENERATOR IMPLANT Right 03/30/2018   Procedure: Right Implantable pulse generator change;  Surgeon: Erline Levine, MD;  Location: [place];  Service: Neurosurgery;  Laterality: Right;   SUBTHALAMIC STIMULATOR BATTERY REPLACEMENT N/A 08/05/2013   Procedure: SUBTHALAMIC STIMULATOR BATTERY REPLACEMENT;  Surgeon: Erline Levine, MD;  Location: [place] NEURO ORS;  Service: Neurosurgery;  Laterality: N/A;   SUBTHALAMIC STIMULATOR BATTERY REPLACEMENT N/A 11/26/2015   Procedure: Implantable pulse generator battery change for deep brain stimulator;  Surgeon: Erline Levine, MD;  Location: [place] NEURO ORS;  Service: Neurosurgery;  Laterality: N/A;   SUBTHALAMIC STIMULATOR BATTERY REPLACEMENT Right 09/13/2020   Procedure: Right chest implantable pulse generator change for depleted  battery;  Surgeon: Erline Levine, MD;  Location: [place];  Service: Neurosurgery;  Laterality: Right;  3C/RM 18   VAGINAL HYSTERECTOMY     "partial"   WRIST SURGERY Right    after fx    Social History   Socioeconomic History   Marital status: Widowed    Spouse name: Not on file   Number of children: Not on file   Years of education: Not on file   Highest education level: Not on file  Occupational History   Occupation: Retired  Tobacco Use   Smoking status: Former    Packs/day: 0.50    Years: 25.00    Pack years: 12.50    Types: Cigarettes    Quit date: 03/01/2019    Years since quitting: 1.9   Smokeless tobacco: Never  Vaping Use   Vaping Use: Never used  Substance and Sexual Activity   Alcohol use: Not Currently   Drug use: No   Sexual activity: Not on file  Other Topics Concern   Not on file  Social History Narrative   She resides at [facility] (assisted living facility).   Social Determinants of Health   Financial Resource Strain: Not on file  Food Insecurity: Not on file  Transportation Needs: Not on file  Physical Activity: Not on file  Stress: Not on file  Social Connections: Not on file  Intimate Partner Violence: Not on file   Family History  Problem Relation Age of Onset   Colon cancer Paternal Uncle       VITAL SIGNS BP 120/71   Pulse 66   Temp (!) 97.5 F (36.4 C)   Resp 17   Ht $R'5\' 2"'db$  (1.575 m)   Wt 161 lb 6.4 oz (73.2 kg)   SpO2 93%   BMI 29.52 kg/m   Outpatient Encounter Medications as of 02/05/2021  Medication Sig   acetaminophen (TYLENOL) 500 MG tablet Take 500 mg by mouth every 6 (six) hours as needed for mild pain or moderate pain.   albuterol (VENTOLIN HFA) 108 (90 Base) MCG/ACT inhaler Inhale 2 puffs into the lungs every 4 (four) hours as needed for wheezing or shortness of breath.   ALPRAZolam (XANAX) 0.25 MG tablet Take 1 tablet (0.25 mg total) by mouth 2 (two) times daily.   alum & mag hydroxide-simeth (MAALOX PLUS) 400-400-40  MG/5ML suspension Take 20 mLs by mouth 2 (two) times daily as needed for indigestion.   amLODipine (NORVASC) 10 MG tablet Take 10 mg by mouth daily.    atorvastatin (LIPITOR) 40 MG tablet Take 1 tablet (40 mg total) by mouth daily.   BREZTRI AEROSPHERE 160-9-4.8 MCG/ACT AERO Inhale 2 puffs into the lungs 2 (two) times daily.   carbidopa-levodopa (SINEMET IR) 25-100 MG  tablet Take 1 tablet by mouth 2 (two) times daily.   Cholecalciferol 50 MCG (2000 UT) CAPS Take 1 capsule by mouth daily.   clopidogrel (PLAVIX) 75 MG tablet Take 75 mg by mouth daily.   Dextromethorphan-quiNIDine (NUEDEXTA) 20-10 MG capsule Take 1 capsule by mouth 2 (two) times daily.   docusate sodium (COLACE) 100 MG capsule Take 100 mg by mouth daily.   donepezil (ARICEPT) 5 MG tablet Take 5 mg by mouth at bedtime. 8 pm for parkinson's dementia   DULoxetine (CYMBALTA) 60 MG capsule Take 60 mg by mouth daily.   Ensure (ENSURE) Take 120 mLs by mouth 2 (two) times daily between meals. 9 am and 9 pm   guaiFENesin (MUCINEX) 600 MG 12 hr tablet Take 600 mg by mouth daily as needed.   losartan (COZAAR) 100 MG tablet Take 100 mg by mouth daily.   meloxicam (MOBIC) 7.5 MG tablet Take 7.5 mg by mouth daily.   miconazole (ZEASORB-AF) 2 % powder Apply 1 application topically as needed for itching (Every Shift - PRN; PRN 1, PRN 2, PRN 3).   NON FORMULARY Diet: : Dys 3 (chopped) diet, continue thin liquids; will allow pizza if requested   omeprazole (PRILOSEC) 20 MG capsule Take 20 mg by mouth in the morning and at bedtime.    oxybutynin (DITROPAN) 5 MG tablet Take 5 mg by mouth 2 (two) times daily.   polyethylene glycol (MIRALAX / GLYCOLAX) 17 g packet Take 17 g by mouth daily as needed.   pramipexole (MIRAPEX) 0.5 MG tablet Take 0.5 mg by mouth at bedtime.   QUEtiapine (SEROQUEL) 25 MG tablet Take 12.5 mg by mouth daily.   traZODone (DESYREL) 100 MG tablet Take 100 mg by mouth at bedtime.   No facility-administered encounter medications  on file as of 02/05/2021.     SIGNIFICANT DIAGNOSTIC EXAMS  LABS REVIEWED; PREVIOUS   05-10-20: wbc 6.2; hgb 14.8; hct 44.7; mcv 94.9 plt 251; glucose 122; bun 14; creat 0.67; k+ 3.5; na++ 140 ca 8.9 GFR>60; alk phos 146; albumin 3.9  07-27-20: wbc 6.9; hgb 13.8; hct 41.9; mcv 95.2 plt 221; glucose 111; bun 19; creat 0.82; k+ 3.4; na++ 139; ca 8.9; GFR>60; liver normal albumin 3.6; chol 110; ldl 45; trig 107 hdl 44 07-30-20: k+ 3.6  09-13-20: wbc 8.3; hgb 17.2; hct 52.6 mcv 95.8 plt 248; glucose 122; bun 15; creat 0.86; k+ 4.5; na++ 139; ca 9.9; GFR>60  12-13-20: urine culture multiple species 01-17-21: wbc 7.1; hgb 14.8; hct 43.5; mcv 93.8 plt 239 01-21-21: wbc 7.7; hgb 15.9; hct 46.8; mcv 92.3 plt 260; glucose 117; bun 13; creat 0.80; k+ 3.3; na++ 138; ca 9.2; GFR>60 liver normal albumin 4.2   NO NEW LABS.   Review of Systems  Constitutional:  Negative for malaise/fatigue.  Respiratory:  Negative for cough and shortness of breath.   Cardiovascular:  Negative for chest pain, palpitations and leg swelling.  Gastrointestinal:  Negative for abdominal pain, constipation and heartburn.  Musculoskeletal:  Positive for joint pain. Negative for back pain and myalgias.       Left arm pain   Skin: Negative.   Neurological:  Negative for dizziness.  Psychiatric/Behavioral:  Positive for depression. The patient is nervous/anxious and has insomnia.     Physical Exam Constitutional:      General: She is not in acute distress.    Appearance: She is well-developed. She is not diaphoretic.  Neck:     Thyroid: No thyromegaly.  Cardiovascular:  Rate and Rhythm: Normal rate and regular rhythm.     Pulses: Normal pulses.     Heart sounds: Normal heart sounds.  Pulmonary:     Effort: Pulmonary effort is normal. No respiratory distress.     Breath sounds: Normal breath sounds.  Abdominal:     General: Bowel sounds are normal. There is no distension.     Palpations: Abdomen is soft.     Tenderness:  There is no abdominal tenderness.  Musculoskeletal:     Cervical back: Neck supple.     Right lower leg: No edema.     Left lower leg: No edema.     Comments:  Has left side weakness Leans head to the left  Bilateral hand tremor left >right      Lymphadenopathy:     Cervical: No cervical adenopathy.  Skin:    General: Skin is warm and dry.  Neurological:     Mental Status: She is alert and oriented to person, place, and time.  Psychiatric:        Mood and Affect: Mood normal.     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

## 2021-02-06 ENCOUNTER — Non-Acute Institutional Stay (SKILLED_NURSING_FACILITY): Payer: Medicare Other | Admitting: Adult Health

## 2021-02-06 DIAGNOSIS — K219 Gastro-esophageal reflux disease without esophagitis: Secondary | ICD-10-CM

## 2021-02-06 DIAGNOSIS — F32A Depression, unspecified: Secondary | ICD-10-CM | POA: Diagnosis not present

## 2021-02-06 DIAGNOSIS — J449 Chronic obstructive pulmonary disease, unspecified: Secondary | ICD-10-CM | POA: Diagnosis not present

## 2021-02-06 DIAGNOSIS — F323 Major depressive disorder, single episode, severe with psychotic features: Secondary | ICD-10-CM | POA: Diagnosis not present

## 2021-02-06 DIAGNOSIS — K5909 Other constipation: Secondary | ICD-10-CM

## 2021-02-06 DIAGNOSIS — R45851 Suicidal ideations: Secondary | ICD-10-CM

## 2021-02-06 NOTE — Progress Notes (Signed)
Location:  [facility] Room Number: 108 Place of Service:  SNF (31)   CODE STATUS: full code   Allergies  Allergen Reactions   Oxycodone Other (See Comments)    Can tolerate low dose mixed with tynlelol HALLUCINATIONS    Docosahexaenoic Acid Other (See Comments)   Eicosapentaenoic Acid    Eicosapentaenoic Acid (Epa)    Lutein Nausea And Vomiting   Omega-3 Fatty Acids Other (See Comments)   Other    Aspirin Nausea And Vomiting and Other (See Comments)    Severe stomach pain    Codeine Nausea Only    Makes her feel very sick     Chief Complaint  Patient presents with   Medical Management of Chronic Issues           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 She has been changed from xanax twice daily to klonopin per neurology instructions. She is not tolerating this change. She is yelling out; screaming; is unable to be comforted. She did not sleep last night. There are no reports of constipation.   Past Medical History:  Diagnosis Date   Anxiety    Arthritis    Complication of anesthesia    Depression    Dysphasia    GERD (gastroesophageal reflux disease)    Hyperlipidemia    Hypertension    Parkinson's disease (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   PONV (postoperative nausea and vomiting)    states it has been a long time since getting sick   Sleep apnea    Has CPAP but doesnt use   Stroke [facility])    TIA (transient ischemic attack)    not really TIA but abnormal MRI per pt left sided weakness   UTI  (lower urinary tract infection)    Vitamin D deficiency     Past Surgical History:  Procedure Laterality Date   BACK SURGERY     Battery change     DBS, 12/2009   BIOPSY N/A 11/29/2013   Procedure: GASTRIC BIOPSY;  Surgeon: Danie Binder, MD;  Location: [facility];  Service: Endoscopy;  Laterality: N/A;   BURR HOLE W/ STEREOTACTIC INSERTION OF DBS LEADS / INTRAOP MICROELECTRODE RECORDING     2007   CARPAL TUNNEL RELEASE Right    COLONOSCOPY WITH PROPOFOL N/A 11/29/2013   Procedure: COLONOSCOPY WITH PROPOFOL;  Surgeon: Danie Binder, MD;  Location: [facility];  Service: Endoscopy;  Laterality: N/A;  entered cecum @ [phone]; total cecal withdrawal time 21 minutes    ELBOW SURGERY Left    after fx   ESOPHAGEAL DILATION N/A 11/29/2013   Procedure: ESOPHAGEAL DILATION;  Surgeon: Danie Binder, MD;  Location: [facility];  Service: Endoscopy;  Laterality: N/A;  [place] 14/15/16   ESOPHAGOGASTRODUODENOSCOPY (EGD) WITH PROPOFOL N/A 11/29/2013   Procedure: ESOPHAGOGASTRODUODENOSCOPY (EGD) WITH PROPOFOL;  Surgeon: Danie Binder, MD;  Location: [facility];  Service: Endoscopy;  Laterality: N/A;   FOOT SURGERY Left    "something with 2nd 2."   NECK SURGERY  Fusion   POLYPECTOMY N/A 11/29/2013   Procedure: POLYPECTOMY;  Surgeon: Danie Binder, MD;  Location: [facility];  Service: Endoscopy;  Laterality: N/A;  Distal Transverse Colon x2    PR ANALYZE NEUROSTIM BRAIN, FIRST 1H  10/15/2012       PULSE GENERATOR IMPLANT Right 03/30/2018   Procedure: Right Implantable pulse generator change;  Surgeon: Erline Levine, MD;  Location: [facility];  Service: Neurosurgery;  Laterality: Right;   SUBTHALAMIC STIMULATOR BATTERY REPLACEMENT N/A 08/05/2013   Procedure: SUBTHALAMIC STIMULATOR BATTERY REPLACEMENT;  Surgeon: Erline Levine, MD;  Location: [place] [facility];  Service: Neurosurgery;  Laterality: N/A;   SUBTHALAMIC STIMULATOR BATTERY REPLACEMENT N/A 11/26/2015   Procedure: Implantable pulse generator battery change for deep brain stimulator;   Surgeon: Erline Levine, MD;  Location: [place] [facility];  Service: Neurosurgery;  Laterality: N/A;   SUBTHALAMIC STIMULATOR BATTERY REPLACEMENT Right 09/13/2020   Procedure: Right chest implantable pulse generator change for depleted battery;  Surgeon: Erline Levine, MD;  Location: [facility];  Service: Neurosurgery;  Laterality: Right;  3C/RM 18   VAGINAL HYSTERECTOMY     "partial"   WRIST SURGERY Right    after fx    Social History   Socioeconomic History   Marital status: Widowed    Spouse name: Not on file   Number of children: Not on file   Years of education: Not on file   Highest education level: Not on file  Occupational History   Occupation: Retired  Tobacco Use   Smoking status: Former    Packs/day: 0.50    Years: 25.00    Pack years: 12.50    Types: Cigarettes    Quit date: 03/01/2019    Years since quitting: 1.9   Smokeless tobacco: Never  Vaping Use   Vaping Use: Never used  Substance and Sexual Activity   Alcohol use: Not Currently   Drug use: No   Sexual activity: Not on file  Other Topics Concern   Not on file  Social History Narrative   She resides at [facility] (assisted living facility).   Social Determinants of Health   Financial Resource Strain: Not on file  Food Insecurity: Not on file  Transportation Needs: Not on file  Physical Activity: Not on file  Stress: Not on file  Social Connections: Not on file  Intimate Partner Violence: Not on file   Family History  Problem Relation Age of Onset   Colon cancer Paternal Uncle       VITAL SIGNS BP 120/74   Pulse 66   Temp 97.6 F (36.4 C)   Ht _0  (1.575 m)   Wt 161 lb 6.4 oz (73.2 kg)   BMI 29.52 kg/m   Outpatient Encounter Medications as of 02/06/2021  Medication Sig   acetaminophen (TYLENOL) 500 MG tablet Take 500 mg by mouth every 6 (six) hours as needed for mild pain or moderate pain.   albuterol (VENTOLIN HFA) 108 (90 Base) MCG/ACT inhaler Inhale 2 puffs into the lungs every 4 (four)  hours as needed for wheezing or shortness of breath.   alum & mag hydroxide-simeth (MAALOX PLUS) 400-400-40 MG/5ML suspension Take 20 mLs by mouth 2 (two) times daily as needed for indigestion.   amLODipine (NORVASC) 10 MG tablet Take 10 mg by mouth daily.    atorvastatin (LIPITOR) 40 MG tablet Take 1 tablet (40 mg total) by mouth daily.   BREZTRI AEROSPHERE 160-9-4.8 MCG/ACT AERO Inhale 2 puffs into the lungs 2 (two) times daily.  carbidopa-levodopa (SINEMET IR) 25-100 MG tablet Take 1 tablet by mouth 2 (two) times daily.   Cholecalciferol 50 MCG (2000 UT) CAPS Take 1 capsule by mouth daily.   clonazePAM (KLONOPIN) 0.5 MG tablet Take 1 tablet (0.5 mg total) by mouth at bedtime.   clopidogrel (PLAVIX) 75 MG tablet Take 75 mg by mouth daily.   Dextromethorphan-quiNIDine (NUEDEXTA) 20-10 MG capsule Take 1 capsule by mouth 2 (two) times daily.   docusate sodium (COLACE) 100 MG capsule Take 100 mg by mouth daily.   donepezil (ARICEPT) 5 MG tablet Take 5 mg by mouth at bedtime. 8 pm for parkinson's dementia   DULoxetine (CYMBALTA) 60 MG capsule Take 60 mg by mouth daily.   Ensure (ENSURE) Take 120 mLs by mouth 2 (two) times daily between meals. 9 am and 9 pm   guaiFENesin (MUCINEX) 600 MG 12 hr tablet Take 600 mg by mouth daily as needed.   losartan (COZAAR) 100 MG tablet Take 100 mg by mouth daily.   meloxicam (MOBIC) 7.5 MG tablet Take 7.5 mg by mouth daily.   miconazole (ZEASORB-AF) 2 % powder Apply 1 application topically as needed for itching (Every Shift - PRN; PRN 1, PRN 2, PRN 3).   NON FORMULARY Diet: : Dys 3 (chopped) diet, continue thin liquids; will allow pizza if requested   omeprazole (PRILOSEC) 20 MG capsule Take 20 mg by mouth in the morning and at bedtime.    oxybutynin (DITROPAN) 5 MG tablet Take 5 mg by mouth 2 (two) times daily.   polyethylene glycol (MIRALAX / GLYCOLAX) 17 g packet Take 17 g by mouth daily as needed.   pramipexole (MIRAPEX) 0.5 MG tablet Take 0.5 mg by mouth at  bedtime.   QUEtiapine (SEROQUEL) 25 MG tablet Take 12.5 mg by mouth daily.   traZODone (DESYREL) 100 MG tablet Take 100 mg by mouth at bedtime.   No facility-administered encounter medications on file as of 02/06/2021.     SIGNIFICANT DIAGNOSTIC EXAMS   LABS REVIEWED; PREVIOUS   05-10-20: wbc 6.2; hgb 14.8; hct 44.7; mcv 94.9 plt 251; glucose 122; bun 14; creat 0.67; k+ 3.5; na++ 140 ca 8.9 GFR>60; alk phos 146; albumin 3.9  07-27-20: wbc 6.9; hgb 13.8; hct 41.9; mcv 95.2 plt 221; glucose 111; bun 19; creat 0.82; k+ 3.4; na++ 139; ca 8.9; GFR>60; liver normal albumin 3.6; chol 110; ldl 45; trig 107 hdl 44 07-30-20: k+ 3.6  09-13-20: wbc 8.3; hgb 17.2; hct 52.6 mcv 95.8 plt 248; glucose 122; bun 15; creat 0.86; k+ 4.5; na++ 139; ca 9.9; GFR>60  12-13-20: urine culture multiple species 01-17-21: wbc 7.1; hgb 14.8; hct 43.5; mcv 93.8 plt 239 01-21-21: wbc 7.7; hgb 15.9; hct 46.8; mcv 92.3 plt 260; glucose 117; bun 13; creat 0.80; k+ 3.3; na++ 138; ca 9.2; GFR>60 liver normal albumin 4.2   TODAY  01-24-21: glucose 107; bun 23; creat 1.01; k+ 3.4; na++ 137; ca 8.8; GFR >58   Review of Systems  Constitutional:  Negative for malaise/fatigue.  Respiratory:  Negative for cough and shortness of breath.   Cardiovascular:  Negative for chest pain, palpitations and leg swelling.  Gastrointestinal:  Negative for abdominal pain, constipation and heartburn.  Musculoskeletal:  Negative for back pain, joint pain and myalgias.  Skin: Negative.   Neurological:  Negative for dizziness.  Psychiatric/Behavioral:  Positive for depression. The patient is nervous/anxious and has insomnia.    Physical Exam Constitutional:      General: She is not in acute distress.  Appearance: She is well-developed. She is not diaphoretic.  Neck:     Thyroid: No thyromegaly.  Cardiovascular:     Rate and Rhythm: Normal rate and regular rhythm.     Pulses: Normal pulses.     Heart sounds: Normal heart sounds.  Pulmonary:      Effort: Pulmonary effort is normal. No respiratory distress.     Breath sounds: Normal breath sounds.  Abdominal:     General: Bowel sounds are normal. There is no distension.     Palpations: Abdomen is soft.     Tenderness: There is no abdominal tenderness.  Musculoskeletal:     Cervical back: Neck supple.     Right lower leg: No edema.     Left lower leg: No edema.     Comments:  Has left side weakness Leans head to the left  Bilateral hand tremor left >right     Lymphadenopathy:     Cervical: No cervical adenopathy.  Skin:    General: Skin is warm and dry.  Neurological:     Mental Status: She is alert and oriented to person, place, and time.  Psychiatric:        Mood and Affect: Mood normal.     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   2. COPD without exacerbation: is stable will continue breztri aerosphere 160-9-4.8 mcg 2 puffs twice daily has albuterol 2 puffs every 4 hours as needed  3. GERD without esophagitis: is stable will continue prilosec 20 mg twice daily   4. Chronic constipation: is stable will continue colace 100 mg every other day; miralax daily as needed   PREVIOUS   5. Chronic pain syndrome: is stable will continue cymbalta 60 mg  daily (also takes for mood state)  6. Parkinson's disease dementia: is without changes weight is 161 pounds; will continue aricept 5 mg nightly   7. Essential hypertension: is stable b/p 120/74 will continue norvasc 10 mg daily cozaar 100 mg daily   8. Parkinson's disease is status post deep brain stimulator; is without change in status will continue sinemet 25/100 mg twice daily; mirapex 1 mg nightly   9. OAB (overactive bladder): is stable  will continue ditropan 5 mg twice daily   10. Mixed hyperlipidemia: is stable LDL 44 will continue lipitor 40 mg daily    Ok Edwards NP [facility]  Contact [phone] Monday through Friday 8am- 5pm  After hours call [phone]

## 2021-02-13 ENCOUNTER — Encounter: Payer: Self-pay | Admitting: Adult Health

## 2021-02-14 ENCOUNTER — Encounter (HOSPITAL_COMMUNITY)
Admission: RE | Admit: 2021-02-14 | Discharge: 2021-02-14 | Disposition: A | Discharge status: Home / Self Care | Payer: Medicare Other | Source: Skilled Nursing Facility | Attending: Adult Health | Admitting: Adult Health

## 2021-02-14 DIAGNOSIS — I10 Essential (primary) hypertension: Secondary | ICD-10-CM | POA: Diagnosis not present

## 2021-02-14 LAB — BASIC METABOLIC PANEL
Anion gap: 6 (ref 5–15)
BUN: 19 mg/dL (ref 8–23)
CO2: 29 mmol/L (ref 22–32)
Calcium: 8.9 mg/dL (ref 8.9–10.3)
Chloride: 102 mmol/L (ref 98–111)
Creatinine, Ser: 0.77 mg/dL (ref 0.44–1.00)
GFR, Estimated: >60 mL/min (ref >60)
Glucose, Bld: 137 mg/dL — ABNORMAL HIGH (ref 70–99)
Potassium: 3.7 mmol/L (ref 3.5–5.1)
Sodium: 137 mmol/L (ref 135–145)

## 2021-02-15 ENCOUNTER — Encounter: Payer: Self-pay | Admitting: Adult Health

## 2021-02-15 ENCOUNTER — Non-Acute Institutional Stay (SKILLED_NURSING_FACILITY): Payer: Medicare Other | Admitting: Adult Health

## 2021-02-15 DIAGNOSIS — F0281 Dementia in other diseases classified elsewhere with behavioral disturbance: Secondary | ICD-10-CM | POA: Diagnosis not present

## 2021-02-15 DIAGNOSIS — F02818 Dementia in other diseases classified elsewhere, unspecified severity, with other behavioral disturbance: Secondary | ICD-10-CM

## 2021-02-15 DIAGNOSIS — J449 Chronic obstructive pulmonary disease, unspecified: Secondary | ICD-10-CM

## 2021-02-15 DIAGNOSIS — G20 Parkinson's disease: Secondary | ICD-10-CM

## 2021-02-15 DIAGNOSIS — F323 Major depressive disorder, single episode, severe with psychotic features: Secondary | ICD-10-CM | POA: Diagnosis not present

## 2021-02-15 NOTE — Progress Notes (Signed)
Location:  [facility] Room Number: 108-D Place of Service:  SNF (31)   CODE STATUS: Full code  Allergies  Allergen Reactions   Oxycodone Other (See Comments)    Can tolerate low dose mixed with tynlelol HALLUCINATIONS    Docosahexaenoic Acid Other (See Comments)   Eicosapentaenoic Acid    Eicosapentaenoic Acid (Epa)    Lutein Nausea And Vomiting   Omega-3 Fatty Acids Other (See Comments)   Other    Aspirin Nausea And Vomiting and Other (See Comments)    Severe stomach pain    Codeine Nausea Only    Makes her feel very sick     Chief Complaint  Patient presents with   Acute Visit    Care plan meeting.    HPI:  We have come together for her care plan meeting. Family present. BIMS 15/15 mood: 9/30: depression; anxious; decreased energy. She is extensive assist with adls. She is incontinent of bladder frequently incontinent of bowel. She is nonambulatory propels self in wheelchair. There have been no falls. No therapy at this time. Dietary: mechanical soft diet has supplements has lost 10 pounds this past quarter weight is 161 pounds.    She did not tolerate wean off xanax to klonopin; was returned back to xanax within 24 hours of the change. We have discussed advanced directives: she does not want CPR done and does not want a feeding tube placed. Since she has a guardian; will need to complete process before theses can go into effect.  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   PONV (postoperative nausea and vomiting)    states it has been a long time since getting sick   Sleep apnea    Has CPAP but doesnt use   Stroke ([place])    TIA (transient ischemic attack)    not really TIA but abnormal MRI per pt left sided weakness   UTI (lower urinary tract infection)    Vitamin D deficiency     Past Surgical History:  Procedure Laterality Date   BACK SURGERY     Battery change     DBS, 12/2009   BIOPSY N/A 11/29/2013   Procedure: GASTRIC BIOPSY;  Surgeon: Danie Binder, MD;  Location: [facility];  Service: Endoscopy;  Laterality: N/A;   BURR HOLE W/ STEREOTACTIC INSERTION OF DBS LEADS / INTRAOP MICROELECTRODE RECORDING     2007   CARPAL TUNNEL RELEASE Right    COLONOSCOPY WITH PROPOFOL N/A 11/29/2013   Procedure: COLONOSCOPY WITH PROPOFOL;  Surgeon: Danie Binder, MD;  Location: [facility];  Service: Endoscopy;  Laterality: N/A;  entered cecum @ [phone]; total cecal withdrawal time 21 minutes    ELBOW SURGERY Left    after fx   ESOPHAGEAL DILATION N/A 11/29/2013   Procedure: ESOPHAGEAL DILATION;  Surgeon: Danie Binder, MD;  Location: [facility];  Service: Endoscopy;  Laterality: N/A;  [place] 14/15/16  ESOPHAGOGASTRODUODENOSCOPY (EGD) WITH PROPOFOL N/A 11/29/2013   Procedure: ESOPHAGOGASTRODUODENOSCOPY (EGD) WITH PROPOFOL;  Surgeon: Danie Binder, MD;  [facility];  Service: Endoscopy;  Laterality: N/A;   FOOT SURGERY Left    "something with 2nd 2."   NECK SURGERY     Fusion   POLYPECTOMY N/A 11/29/2013   Procedure: POLYPECTOMY;  Surgeon: Danie Binder, MD;  Location: [facility];  Service: Endoscopy;  Laterality: N/A;  Distal Transverse Colon x2    PR ANALYZE NEUROSTIM BRAIN, FIRST 1H  10/15/2012       PULSE GENERATOR IMPLANT Right 03/30/2018   Procedure: Right Implantable pulse generator change;  Surgeon: Erline Levine, MD;  Location: [place];  Service: Neurosurgery;  Laterality: Right;   SUBTHALAMIC STIMULATOR BATTERY REPLACEMENT N/A  08/05/2013   Procedure: SUBTHALAMIC STIMULATOR BATTERY REPLACEMENT;  Surgeon: Erline Levine, MD;  Location: [place] [facility];  Service: Neurosurgery;  Laterality: N/A;   SUBTHALAMIC STIMULATOR BATTERY REPLACEMENT N/A 11/26/2015   Procedure: Implantable pulse generator battery change for deep brain stimulator;  Surgeon: Erline Levine, MD;  Location: [place] [facility];  Service: Neurosurgery;  Laterality: N/A;   SUBTHALAMIC STIMULATOR BATTERY REPLACEMENT Right 09/13/2020   Procedure: Right chest implantable pulse generator change for depleted battery;  Surgeon: Erline Levine, MD;  Location: [place];  Service: Neurosurgery;  Laterality: Right;  3C/RM 18   VAGINAL HYSTERECTOMY     "partial"   WRIST SURGERY Right    after fx    Social History   Socioeconomic History   Marital status: Widowed    Spouse name: Not on file   Number of children: Not on file   Years of education: Not on file   Highest education level: Not on file  Occupational History   Occupation: Retired  Tobacco Use   Smoking status: Former    Packs/day: 0.50    Years: 25.00    Pack years: 12.50    Types: Cigarettes    Quit date: 03/01/2019    Years since quitting: 1.9   Smokeless tobacco: Never  Vaping Use   Vaping Use: Never used  Substance and Sexual Activity   Alcohol use: Not Currently   Drug use: No   Sexual activity: Not on file  Other Topics Concern   Not on file  Social History Narrative   She resides at [facility] (assisted living facility).   Social Determinants of Health   Financial Resource Strain: Not on file  Food Insecurity: Not on file  Transportation Needs: Not on file  Physical Activity: Not on file  Stress: Not on file  Social Connections: Not on file  Intimate Partner Violence: Not on file   Family History  Problem Relation Age of Onset   Colon cancer Paternal Uncle       VITAL SIGNS BP 131/84   Pulse 75   Temp (!) 97.2 F (36.2 C)   Resp 18   Ht 5' 2"  (1.575 m)   Wt 161 lb 6.4 oz (73.2  kg)   SpO2 96%   BMI 29.52 kg/m   Outpatient Encounter Medications as of 02/15/2021  Medication Sig   acetaminophen (TYLENOL) 500 MG tablet Take 500 mg by mouth every 6 (six) hours as needed for mild pain or moderate pain.   albuterol (VENTOLIN HFA) 108 (90 Base) MCG/ACT inhaler Inhale 2 puffs into the lungs every 4 (four) hours as needed for wheezing or shortness of breath.   ALPRAZolam (XANAX) 0.25 MG tablet Take 0.25 mg by mouth 2 (two)  times daily. Major depressive disorder, recurrent, unspecified   alum & mag hydroxide-simeth (MAALOX PLUS) 400-400-40 MG/5ML suspension Take 20 mLs by mouth 2 (two) times daily as needed for indigestion.   amLODipine (NORVASC) 10 MG tablet Take 10 mg by mouth daily.    atorvastatin (LIPITOR) 40 MG tablet Take 1 tablet (40 mg total) by mouth daily.   BREZTRI AEROSPHERE 160-9-4.8 MCG/ACT AERO Inhale 2 puffs into the lungs 2 (two) times daily.   carbidopa-levodopa (SINEMET IR) 25-100 MG tablet Take 1 tablet by mouth 2 (two) times daily.   Cholecalciferol 50 MCG (2000 UT) CAPS Take 1 capsule by mouth daily.   clopidogrel (PLAVIX) 75 MG tablet Take 75 mg by mouth daily.   Dextromethorphan-quiNIDine (NUEDEXTA) 20-10 MG capsule Take 1 capsule by mouth 2 (two) times daily.   docusate sodium (COLACE) 100 MG capsule Take 100 mg by mouth daily.   donepezil (ARICEPT) 5 MG tablet Take 5 mg by mouth at bedtime. 8 pm for parkinson's dementia   DULoxetine (CYMBALTA) 60 MG capsule Take 60 mg by mouth daily.   Ensure (ENSURE) Take 120 mLs by mouth 2 (two) times daily between meals. 9 am and 9 pm   guaiFENesin (MUCINEX) 600 MG 12 hr tablet Take 600 mg by mouth daily as needed.   losartan (COZAAR) 100 MG tablet Take 100 mg by mouth daily.   meloxicam (MOBIC) 7.5 MG tablet Take 7.5 mg by mouth daily.   miconazole (ZEASORB-AF) 2 % powder Apply 1 application topically as needed for itching (Every Shift - PRN; PRN 1, PRN 2, PRN 3).   NON FORMULARY Diet: : Dys 3 (chopped) diet,  continue thin liquids; will allow pizza if requested   omeprazole (PRILOSEC) 20 MG capsule Take 20 mg by mouth in the morning and at bedtime.    oxybutynin (DITROPAN) 5 MG tablet Take 5 mg by mouth 2 (two) times daily.   polyethylene glycol (MIRALAX / GLYCOLAX) 17 g packet Take 17 g by mouth daily as needed.   pramipexole (MIRAPEX) 0.5 MG tablet Take 0.5 mg by mouth at bedtime.   QUEtiapine (SEROQUEL) 25 MG tablet Take 12.5 mg by mouth daily.   traZODone (DESYREL) 100 MG tablet Take 100 mg by mouth at bedtime.   [DISCONTINUED] clonazePAM (KLONOPIN) 0.5 MG tablet Take 1 tablet (0.5 mg total) by mouth at bedtime.   No facility-administered encounter medications on file as of 02/15/2021.     SIGNIFICANT DIAGNOSTIC EXAMS  LABS REVIEWED; PREVIOUS   05-10-20: wbc 6.2; hgb 14.8; hct 44.7; mcv 94.9 plt 251; glucose 122; bun 14; creat 0.67; k+ 3.5; na++ 140 ca 8.9 GFR>60; alk phos 146; albumin 3.9  07-27-20: wbc 6.9; hgb 13.8; hct 41.9; mcv 95.2 plt 221; glucose 111; bun 19; creat 0.82; k+ 3.4; na++ 139; ca 8.9; GFR>60; liver normal albumin 3.6; chol 110; ldl 45; trig 107 hdl 44 07-30-20: k+ 3.6  09-13-20: wbc 8.3; hgb 17.2; hct 52.6 mcv 95.8 plt 248; glucose 122; bun 15; creat 0.86; k+ 4.5; na++ 139; ca 9.9; GFR>60  12-13-20: urine culture multiple species 01-17-21: wbc 7.1; hgb 14.8; hct 43.5; mcv 93.8 plt 239 01-21-21: wbc 7.7; hgb 15.9; hct 46.8; mcv 92.3 plt 260; glucose 117; bun 13; creat 0.80; k+ 3.3; na++ 138; ca 9.2; GFR>60 liver normal albumin 4.2  01-24-21: glucose 107; bun 23; creat 1.01; k+ 3.4; na++ 137; ca 8.8; GFR >58   NO NEW LABS.   Review of Systems  Constitutional:  Negative for malaise/fatigue.  Respiratory:  Negative for cough and shortness of breath.   Cardiovascular:  Negative for chest pain, palpitations and leg swelling.  Gastrointestinal:  Negative for abdominal pain, constipation and heartburn.  Musculoskeletal:  Negative for back pain, joint pain and myalgias.  Skin:  Negative.   Neurological:  Negative for dizziness.  Psychiatric/Behavioral:  The patient is not nervous/anxious.    Physical Exam Constitutional:      General: She is not in acute distress.    Appearance: She is well-developed. She is not diaphoretic.  Neck:     Thyroid: No thyromegaly.  Cardiovascular:     Rate and Rhythm: Normal rate and regular rhythm.     Pulses: Normal pulses.     Heart sounds: Normal heart sounds.  Pulmonary:     Effort: Pulmonary effort is normal. No respiratory distress.     Breath sounds: Normal breath sounds.  Abdominal:     General: Bowel sounds are normal. There is no distension.     Palpations: Abdomen is soft.     Tenderness: There is no abdominal tenderness.  Musculoskeletal:     Cervical back: Neck supple.     Right lower leg: No edema.     Left lower leg: No edema.     Comments: Has left side weakness Leans head to the left  Bilateral hand tremor left >right  Has bilateral eyebrow movement Right arm shrug  Lymphadenopathy:     Cervical: No cervical adenopathy.  Skin:    General: Skin is warm and dry.  Neurological:     Mental Status: She is alert and oriented to person, place, and time.  Psychiatric:        Mood and Affect: Mood normal.      ASSESSMENT/ PLAN:  TODAY  COPD without exacerbation Dementia due to parkinson's disease with behavioral disturbance Parkinson's disease  Severe major depression with psychotic features  Will make the following changes to her medications Will stop ditropan as this medication is not providing any benefit Will increase mobic to 15 mg daily  Will increase seroquel to 25 mg in the afternoon At this time her DNR and no feeding are pending state process.   Time spent patient: 60  minutes: discussed medications; advanced directives (35 minutes) plan of care.    Ok Edwards NP [facility]  Contact [phone] Monday through Friday 8am- 5pm  After hours call [phone]

## 2021-02-16 ENCOUNTER — Other Ambulatory Visit: Payer: Self-pay | Admitting: Adult Health

## 2021-02-18 ENCOUNTER — Other Ambulatory Visit: Payer: Self-pay | Admitting: Adult Health

## 2021-02-18 MED ORDER — ALPRAZOLAM 0.25 MG PO TABS: 0.2500 mg | ORAL_TABLET | Freq: Two times a day (BID) | ORAL | 0 refills | Status: DC

## 2021-02-25 ENCOUNTER — Other Ambulatory Visit: Payer: Self-pay | Admitting: Adult Health

## 2021-03-04 ENCOUNTER — Other Ambulatory Visit: Payer: Self-pay | Admitting: Adult Health

## 2021-03-04 MED ORDER — ALPRAZOLAM 0.25 MG PO TABS: 0.2500 mg | ORAL_TABLET | Freq: Two times a day (BID) | ORAL | 0 refills | Status: DC

## 2021-03-06 ENCOUNTER — Encounter: Payer: Self-pay | Admitting: Adult Health

## 2021-03-06 ENCOUNTER — Non-Acute Institutional Stay (SKILLED_NURSING_FACILITY): Payer: Medicare Other | Admitting: Adult Health

## 2021-03-06 DIAGNOSIS — G20 Parkinson's disease: Secondary | ICD-10-CM | POA: Diagnosis not present

## 2021-03-06 DIAGNOSIS — G894 Chronic pain syndrome: Secondary | ICD-10-CM

## 2021-03-06 DIAGNOSIS — F02B2 Dementia in other diseases classified elsewhere, moderate, with psychotic disturbance: Secondary | ICD-10-CM

## 2021-03-06 DIAGNOSIS — I10 Essential (primary) hypertension: Secondary | ICD-10-CM

## 2021-03-06 NOTE — Progress Notes (Signed)
Location:  [place] Room Number: 117-D Place of Service:  SNF (31)   CODE STATUS: Full Code   Allergies  Allergen Reactions   Oxycodone Other (See Comments)    Can tolerate low dose mixed with tynlelol HALLUCINATIONS    Docosahexaenoic Acid Other (See Comments)   Eicosapentaenoic Acid    Eicosapentaenoic Acid (Epa)    Lutein Nausea And Vomiting   Omega-3 Fatty Acids Other (See Comments)   Other    Aspirin Nausea And Vomiting and Other (See Comments)    Severe stomach pain    Codeine Nausea Only    Makes her feel very sick     Chief Complaint  Patient presents with   Medical Management of Chronic Issues           Chronic pain syndrome:    Parkinson's disease dementia;  Essential hypertension:  Parkinson's disease:    HPI:  She is a 75 year old long term resident of this facility being seen for the management of her chronic illnesses:  Chronic pain syndrome:    Parkinson's disease dementia;  Essential hypertension:  Parkinson's disease. There are no reports of uncontrolled pain. There are no reports of yelling out; or uncontrolled anxiety.   Past Medical History:  Diagnosis Date   Anxiety    Arthritis    Complication of anesthesia    Depression    Depression with suicidal ideation 01/21/2021   Dysphasia    GERD (gastroesophageal reflux disease)    Hyperlipidemia    Hypertension    Parkinson's disease ([place])    diagnosed at age 65   Parkinson's disease dementia ([place]) 07/31/2020   PONV (postoperative nausea and vomiting)    states it has been a long time since getting sick   Sleep apnea    Has CPAP but doesnt use   Stroke [facility])    TIA (transient ischemic attack)    not really TIA but abnormal MRI per pt left sided weakness   UTI (lower urinary tract infection)    Vitamin D deficiency     Past Surgical History:  Procedure Laterality Date   BACK SURGERY     Battery change     DBS, 12/2009   BIOPSY N/A 11/29/2013   Procedure: GASTRIC  BIOPSY;  Surgeon: Danie Binder, MD;  Location: [facility];  Service: Endoscopy;  Laterality: N/A;   BURR HOLE W/ STEREOTACTIC INSERTION OF DBS LEADS / INTRAOP MICROELECTRODE RECORDING     2007   CARPAL TUNNEL RELEASE Right    COLONOSCOPY WITH PROPOFOL N/A 11/29/2013   Procedure: COLONOSCOPY WITH PROPOFOL;  Surgeon: Danie Binder, MD;  Location: [facility];  Service: Endoscopy;  Laterality: N/A;  entered cecum @ [phone]; total cecal withdrawal time 21 minutes    ELBOW SURGERY Left    after fx   ESOPHAGEAL DILATION N/A 11/29/2013   Procedure: ESOPHAGEAL DILATION;  Surgeon: Danie Binder, MD;  Location: [facility];  Service: Endoscopy;  Laterality: N/A;  [place] 14/15/16   ESOPHAGOGASTRODUODENOSCOPY (EGD) WITH PROPOFOL N/A 11/29/2013   Procedure: ESOPHAGOGASTRODUODENOSCOPY (EGD) WITH PROPOFOL;  Surgeon: Danie Binder, MD;  Location: [facility];  Service: Endoscopy;  Laterality: N/A;   FOOT SURGERY Left    "something with 2nd 2."   NECK SURGERY     Fusion   POLYPECTOMY N/A 11/29/2013   Procedure: POLYPECTOMY;  Surgeon: Danie Binder, MD;  Location: [facility];  Service: Endoscopy;  Laterality: N/A;  Distal Transverse Colon x2  PR ANALYZE NEUROSTIM BRAIN, FIRST 1H  10/15/2012       PULSE GENERATOR IMPLANT Right 03/30/2018   Procedure: Right Implantable pulse generator change;  Surgeon: Erline Levine, MD;  Location: [place];  Service: Neurosurgery;  Laterality: Right;   SUBTHALAMIC STIMULATOR BATTERY REPLACEMENT N/A 08/05/2013   Procedure: SUBTHALAMIC STIMULATOR BATTERY REPLACEMENT;  Surgeon: Erline Levine, MD;  Location: [place] [facility];  Service: Neurosurgery;  Laterality: N/A;   SUBTHALAMIC STIMULATOR BATTERY REPLACEMENT N/A 11/26/2015   Procedure: Implantable pulse generator battery change for deep brain stimulator;  Surgeon: Erline Levine, MD;  Location: [place] [facility];  Service: Neurosurgery;  Laterality: N/A;   SUBTHALAMIC STIMULATOR BATTERY REPLACEMENT Right 09/13/2020   Procedure: Right chest implantable pulse generator  change for depleted battery;  Surgeon: Erline Levine, MD;  Location: [place];  Service: Neurosurgery;  Laterality: Right;  3C/RM 18   VAGINAL HYSTERECTOMY     "partial"   WRIST SURGERY Right    after fx    Social History   Socioeconomic History   Marital status: Widowed    Spouse name: Not on file   Number of children: Not on file   Years of education: Not on file   Highest education level: Not on file  Occupational History   Occupation: Retired  Tobacco Use   Smoking status: Former    Packs/day: 0.50    Years: 25.00    Pack years: 12.50    Types: Cigarettes    Quit date: 03/01/2019    Years since quitting: 2.0   Smokeless tobacco: Never  Vaping Use   Vaping Use: Never used  Substance and Sexual Activity   Alcohol use: Not Currently   Drug use: No   Sexual activity: Not on file  Other Topics Concern   Not on file  Social History Narrative   She resides at [facility] (assisted living facility).   Social Determinants of Health   Financial Resource Strain: Not on file  Food Insecurity: Not on file  Transportation Needs: Not on file  Physical Activity: Not on file  Stress: Not on file  Social Connections: Not on file  Intimate Partner Violence: Not on file   Family History  Problem Relation Age of Onset   Colon cancer Paternal Uncle       VITAL SIGNS BP 128/72   Pulse 68   Temp 98.1 F (36.7 C)   Resp 20   Ht 5' 2" (1.575 m)   Wt 162 lb 9.6 oz (73.8 kg)   SpO2 96%   BMI 29.74 kg/m   Outpatient Encounter Medications as of 03/06/2021  Medication Sig   acetaminophen (TYLENOL) 500 MG tablet Take 500 mg by mouth every 6 (six) hours as needed for mild pain or moderate pain.   albuterol (VENTOLIN HFA) 108 (90 Base) MCG/ACT inhaler Inhale 2 puffs into the lungs every 4 (four) hours as needed for wheezing or shortness of breath.   ALPRAZolam (XANAX) 0.25 MG tablet Take 1 tablet (0.25 mg total) by mouth 2 (two) times daily. Major depressive disorder, recurrent,  unspecified   alum & mag hydroxide-simeth (MAALOX PLUS) 400-400-40 MG/5ML suspension Take 20 mLs by mouth 2 (two) times daily as needed for indigestion.   amLODipine (NORVASC) 10 MG tablet Take 10 mg by mouth daily.    atorvastatin (LIPITOR) 40 MG tablet Take 1 tablet (40 mg total) by mouth daily.   BREZTRI AEROSPHERE 160-9-4.8 MCG/ACT AERO Inhale 2 puffs into the lungs 2 (two) times daily.   carbidopa-levodopa (  SINEMET IR) 25-100 MG tablet Take 1 tablet by mouth 2 (two) times daily.   Cholecalciferol 50 MCG (2000 UT) CAPS Take 1 capsule by mouth daily.   clopidogrel (PLAVIX) 75 MG tablet Take 75 mg by mouth daily.   Dextromethorphan-quiNIDine (NUEDEXTA) 20-10 MG capsule Take 1 capsule by mouth 2 (two) times daily.   docusate sodium (COLACE) 100 MG capsule Take 100 mg by mouth daily.   donepezil (ARICEPT) 5 MG tablet Take 5 mg by mouth at bedtime. 8 pm for parkinson's dementia   DULoxetine (CYMBALTA) 60 MG capsule Take 60 mg by mouth daily.   Ensure (ENSURE) Take 120 mLs by mouth 2 (two) times daily between meals. 9 am and 9 pm   guaiFENesin (MUCINEX) 600 MG 12 hr tablet Take 600 mg by mouth daily as needed.   losartan (COZAAR) 100 MG tablet Take 100 mg by mouth daily.   meloxicam (MOBIC) 15 MG tablet Take 15 mg by mouth daily.   miconazole (ZEASORB-AF) 2 % powder Apply 1 application topically as needed for itching (Every Shift - PRN; PRN 1, PRN 2, PRN 3).   NON FORMULARY Diet: : Dys 3 (chopped) diet, continue thin liquids; will allow pizza if requested   omeprazole (PRILOSEC) 20 MG capsule Take 20 mg by mouth in the morning and at bedtime.    polyethylene glycol (MIRALAX / GLYCOLAX) 17 g packet Take 17 g by mouth daily as needed.   pramipexole (MIRAPEX) 0.5 MG tablet Take 0.5 mg by mouth at bedtime.   QUEtiapine (SEROQUEL) 25 MG tablet Take 25 mg by mouth daily.   traZODone (DESYREL) 100 MG tablet Take 100 mg by mouth at bedtime.   valbenazine (INGREZZA) 40 MG capsule Take 40 mg by mouth at  bedtime.   [DISCONTINUED] meloxicam (MOBIC) 7.5 MG tablet Take 7.5 mg by mouth daily.   [DISCONTINUED] oxybutynin (DITROPAN) 5 MG tablet Take 5 mg by mouth 2 (two) times daily.   No facility-administered encounter medications on file as of 03/06/2021.     SIGNIFICANT DIAGNOSTIC EXAMS   LABS REVIEWED; PREVIOUS   05-10-20: wbc 6.2; hgb 14.8; hct 44.7; mcv 94.9 plt 251; glucose 122; bun 14; creat 0.67; k+ 3.5; na++ 140 ca 8.9 GFR>60; alk phos 146; albumin 3.9  07-27-20: wbc 6.9; hgb 13.8; hct 41.9; mcv 95.2 plt 221; glucose 111; bun 19; creat 0.82; k+ 3.4; na++ 139; ca 8.9; GFR>60; liver normal albumin 3.6; chol 110; ldl 45; trig 107 hdl 44 07-30-20: k+ 3.6  09-13-20: wbc 8.3; hgb 17.2; hct 52.6 mcv 95.8 plt 248; glucose 122; bun 15; creat 0.86; k+ 4.5; na++ 139; ca 9.9; GFR>60  12-13-20: urine culture multiple species 01-17-21: wbc 7.1; hgb 14.8; hct 43.5; mcv 93.8 plt 239 01-21-21: wbc 7.7; hgb 15.9; hct 46.8; mcv 92.3 plt 260; glucose 117; bun 13; creat 0.80; k+ 3.3; na++ 138; ca 9.2; GFR>60 liver normal albumin 4.2  01-24-21: glucose 107; bun 23; creat 1.01; k+ 3.4; na++ 137; ca 8.8; GFR >58   NO NEW LABS.   Review of Systems  Constitutional:  Negative for malaise/fatigue.  Respiratory:  Negative for cough and shortness of breath.   Cardiovascular:  Negative for chest pain, palpitations and leg swelling.  Gastrointestinal:  Negative for abdominal pain, constipation and heartburn.  Musculoskeletal:  Negative for back pain, joint pain and myalgias.  Skin: Negative.   Neurological:  Negative for dizziness.  Psychiatric/Behavioral:  The patient is not nervous/anxious.    Physical Exam Constitutional:  General: She is not in acute distress.    Appearance: She is well-developed. She is not diaphoretic.  Neck:     Thyroid: No thyromegaly.  Cardiovascular:     Rate and Rhythm: Normal rate and regular rhythm.     Pulses: Normal pulses.     Heart sounds: Normal heart sounds.  Pulmonary:      Effort: Pulmonary effort is normal. No respiratory distress.     Breath sounds: Normal breath sounds.  Abdominal:     General: Bowel sounds are normal. There is no distension.     Palpations: Abdomen is soft.     Tenderness: There is no abdominal tenderness.  Musculoskeletal:     Cervical back: Neck supple.     Right lower leg: No edema.     Left lower leg: No edema.     Comments: Has left side weakness Leans head to the left  Bilateral hand tremor left >right  Has bilateral eyebrow movement Right arm shrug  Lymphadenopathy:     Cervical: No cervical adenopathy.  Skin:    General: Skin is warm and dry.  Neurological:     Mental Status: She is alert and oriented to person, place, and time.  Psychiatric:        Mood and Affect: Mood normal.     ASSESSMENT/ PLAN:  TODAY  Chronic pain syndrome: is stable will continue cymbalta 60 mg daily (also takes for mood state)  2. Parkinson's disease dementia; is without change; weight is 162 pounds will continue aricept 5 mg daily   3. Essential hypertension: is stable b/p 128/72 will continue norvasc 10 mg daily and cozaar 100 mg daily   4. Parkinson's disease: is stable status post deep vein stimulator; is without change will continue sinemet IR 25/100 mg twice daily and mirapex 0.5 mg nightly   PREVIOUS   5. OAB (overactive bladder): is completely incontinent is off ditropan   6. Mixed hyperlipidemia: is stable LDL 44 will continue lipitor 40 mg daily  7. PBA: is stable will continue nuedexta 10/10 mg twice daily   8. Tardive dyskinesia: is stable will continue ingrezza 40 mg daily   9. 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   10. COPD without exacerbation: is stable will continue breztri  aerosphere 160-9-4.8 mcg 2 puffs twice daily has albuterol 2 puffs every 4 hours as needed  11. GERD without esophagitis: is stable will continue prilosec 20 mg twice daily   12. Chronic constipation: is stable will continue colace 100 mg every day; miralax daily as needed    Ok Edwards NP [facility]  Contact [phone] Monday through Friday 8am- 5pm  After hours call [phone]

## 2021-03-13 ENCOUNTER — Non-Acute Institutional Stay (SKILLED_NURSING_FACILITY): Payer: Medicare Other | Admitting: Adult Health

## 2021-03-13 ENCOUNTER — Encounter: Payer: Self-pay | Admitting: Adult Health

## 2021-03-13 DIAGNOSIS — M545 Low back pain, unspecified: Secondary | ICD-10-CM | POA: Diagnosis not present

## 2021-03-13 DIAGNOSIS — G8929 Other chronic pain: Secondary | ICD-10-CM | POA: Diagnosis not present

## 2021-03-13 NOTE — Progress Notes (Signed)
Location:  [place] Room Number: 117-D Place of Service:  SNF (31) Provider: Ok Edwards, NP  CODE STATUS: FULL CODE  Allergies  Allergen Reactions   Oxycodone Other (See Comments)    Can tolerate low dose mixed with tynlelol HALLUCINATIONS    Docosahexaenoic Acid Other (See Comments)   Eicosapentaenoic Acid    Eicosapentaenoic Acid (Epa)    Lutein Nausea And Vomiting   Omega-3 Fatty Acids Other (See Comments)   Other    Aspirin Nausea And Vomiting and Other (See Comments)    Severe stomach pain    Codeine Nausea Only    Makes her feel very sick     Chief Complaint  Patient presents with   Acute Visit    Back pain.    HPI:  Her family is concerned about her tremors: they feel as though they are getting worse; her family states that she is unable to feed herself.  Upon entering her room she was combing her hair. There are few tremors present. She tells me that she is able to feed herself half her meals. She is being followed by therapy. Therapy states that her tremors are less.  She does have bilateral lower back pain; without radiculopathy. She has prn tylenol ordered.   Past Medical History:  Diagnosis Date   Anxiety    Arthritis    Complication of anesthesia    Depression    Depression with suicidal ideation 01/21/2021   Dysphasia    GERD (gastroesophageal reflux disease)    Hyperlipidemia    Hypertension    Parkinson's disease ([place])    diagnosed at age 42   Parkinson's disease dementia ([place]) 07/31/2020   PONV (postoperative nausea and vomiting)    states it has been a long time since getting sick   Sleep apnea    Has CPAP but doesnt use   Stroke [facility])    TIA (transient ischemic attack)    not really TIA but abnormal MRI per pt left sided weakness   UTI (lower urinary tract infection)    Vitamin D deficiency     Past Surgical History:  Procedure Laterality Date   BACK SURGERY     Battery change     DBS, 12/2009   BIOPSY N/A  11/29/2013   Procedure: GASTRIC BIOPSY;  Surgeon: Danie Binder, MD;  Location: [facility];  Service: Endoscopy;  Laterality: N/A;   BURR HOLE W/ STEREOTACTIC INSERTION OF DBS LEADS / INTRAOP MICROELECTRODE RECORDING     2007   CARPAL TUNNEL RELEASE Right    COLONOSCOPY WITH PROPOFOL N/A 11/29/2013   Procedure: COLONOSCOPY WITH PROPOFOL;  Surgeon: Danie Binder, MD;  Location: [facility];  Service: Endoscopy;  Laterality: N/A;  entered cecum @ [phone]; total cecal withdrawal time 21 minutes    ELBOW SURGERY Left    after fx   ESOPHAGEAL DILATION N/A 11/29/2013   Procedure: ESOPHAGEAL DILATION;  Surgeon: Danie Binder, MD;  Location: [facility];  Service: Endoscopy;  Laterality: N/A;  [place] 14/15/16   ESOPHAGOGASTRODUODENOSCOPY (EGD) WITH PROPOFOL N/A 11/29/2013   Procedure: ESOPHAGOGASTRODUODENOSCOPY (EGD) WITH PROPOFOL;  Surgeon: Danie Binder, MD;  Location: [facility];  Service: Endoscopy;  Laterality: N/A;   FOOT SURGERY Left    "something with 2nd 2."   NECK SURGERY     Fusion   POLYPECTOMY N/A 11/29/2013   Procedure: POLYPECTOMY;  Surgeon: Danie Binder, MD;  Location: [facility];  Service: Endoscopy;  Laterality: N/A;  Distal  Transverse Colon x2    PR ANALYZE NEUROSTIM BRAIN, FIRST 1H  10/15/2012       PULSE GENERATOR IMPLANT Right 03/30/2018   Procedure: Right Implantable pulse generator change;  Surgeon: Erline Levine, MD;  Location: [place];  Service: Neurosurgery;  Laterality: Right;   SUBTHALAMIC STIMULATOR BATTERY REPLACEMENT N/A 08/05/2013   Procedure: SUBTHALAMIC STIMULATOR BATTERY REPLACEMENT;  Surgeon: Erline Levine, MD;  Location: [place] [facility];  Service: Neurosurgery;  Laterality: N/A;   SUBTHALAMIC STIMULATOR BATTERY REPLACEMENT N/A 11/26/2015   Procedure: Implantable pulse generator battery change for deep brain stimulator;  Surgeon: Erline Levine, MD;  Location: [place] [facility];  Service: Neurosurgery;  Laterality: N/A;   SUBTHALAMIC STIMULATOR BATTERY REPLACEMENT Right 09/13/2020   Procedure: Right chest  implantable pulse generator change for depleted battery;  Surgeon: Erline Levine, MD;  Location: [place];  Service: Neurosurgery;  Laterality: Right;  3C/RM 18   VAGINAL HYSTERECTOMY     "partial"   WRIST SURGERY Right    after fx    Social History   Socioeconomic History   Marital status: Widowed    Spouse name: Not on file   Number of children: Not on file   Years of education: Not on file   Highest education level: Not on file  Occupational History   Occupation: Retired  Tobacco Use   Smoking status: Former    Packs/day: 0.50    Years: 25.00    Pack years: 12.50    Types: Cigarettes    Quit date: 03/01/2019    Years since quitting: 2.0   Smokeless tobacco: Never  Vaping Use   Vaping Use: Never used  Substance and Sexual Activity   Alcohol use: Not Currently   Drug use: No   Sexual activity: Not on file  Other Topics Concern   Not on file  Social History Narrative   She resides at [facility] (assisted living facility).   Social Determinants of Health   Financial Resource Strain: Not on file  Food Insecurity: Not on file  Transportation Needs: Not on file  Physical Activity: Not on file  Stress: Not on file  Social Connections: Not on file  Intimate Partner Violence: Not on file   Family History  Problem Relation Age of Onset   Colon cancer Paternal Uncle       VITAL SIGNS BP 120/74   Pulse 64   Temp 97.8 F (36.6 C)   Resp (!) 22   Ht 5' 2"  (1.575 m)   Wt 162 lb 9.6 oz (73.8 kg)   SpO2 95%   BMI 29.74 kg/m   Outpatient Encounter Medications as of 03/13/2021  Medication Sig   acetaminophen (TYLENOL) 500 MG tablet Take 500 mg by mouth every 6 (six) hours as needed for mild pain or moderate pain.   albuterol (VENTOLIN HFA) 108 (90 Base) MCG/ACT inhaler Inhale 2 puffs into the lungs every 4 (four) hours as needed for wheezing or shortness of breath.   ALPRAZolam (XANAX) 0.25 MG tablet Take 1 tablet (0.25 mg total) by mouth 2 (two) times daily. Major  depressive disorder, recurrent, unspecified   alum & mag hydroxide-simeth (MAALOX PLUS) 400-400-40 MG/5ML suspension Take 20 mLs by mouth 2 (two) times daily as needed for indigestion.   amLODipine (NORVASC) 10 MG tablet Take 10 mg by mouth daily.    atorvastatin (LIPITOR) 40 MG tablet Take 1 tablet (40 mg total) by mouth daily.   BREZTRI AEROSPHERE 160-9-4.8 MCG/ACT AERO Inhale 2 puffs into the lungs  2 (two) times daily.   carbidopa-levodopa (SINEMET IR) 25-100 MG tablet Take 1 tablet by mouth 2 (two) times daily.   Cholecalciferol 50 MCG (2000 UT) CAPS Take 1 capsule by mouth daily.   clopidogrel (PLAVIX) 75 MG tablet Take 75 mg by mouth daily.   Dextromethorphan-quiNIDine (NUEDEXTA) 20-10 MG capsule Take 1 capsule by mouth 2 (two) times daily.   docusate sodium (COLACE) 100 MG capsule Take 100 mg by mouth daily.   donepezil (ARICEPT) 5 MG tablet Take 5 mg by mouth at bedtime. 8 pm for parkinson's dementia   DULoxetine (CYMBALTA) 60 MG capsule Take 60 mg by mouth daily.   Ensure (ENSURE) Take 120 mLs by mouth 2 (two) times daily between meals. 9 am and 9 pm   guaiFENesin (MUCINEX) 600 MG 12 hr tablet Take 600 mg by mouth daily as needed.   losartan (COZAAR) 100 MG tablet Take 100 mg by mouth daily.   meloxicam (MOBIC) 15 MG tablet Take 15 mg by mouth daily.   miconazole (ZEASORB-AF) 2 % powder Apply 1 application topically as needed for itching (Every Shift - PRN; PRN 1, PRN 2, PRN 3).   NON FORMULARY Diet: : Dys 3 (chopped) diet, continue thin liquids; will allow pizza if requested   omeprazole (PRILOSEC) 20 MG capsule Take 20 mg by mouth in the morning and at bedtime.    polyethylene glycol (MIRALAX / GLYCOLAX) 17 g packet Take 17 g by mouth daily as needed.   pramipexole (MIRAPEX) 0.5 MG tablet Take 0.5 mg by mouth at bedtime.   QUEtiapine (SEROQUEL) 25 MG tablet Take 25 mg by mouth daily.   traZODone (DESYREL) 100 MG tablet Take 100 mg by mouth at bedtime.   valbenazine (INGREZZA) 40 MG  capsule Take 40 mg by mouth at bedtime.   No facility-administered encounter medications on file as of 03/13/2021.     SIGNIFICANT DIAGNOSTIC EXAMS   LABS REVIEWED; PREVIOUS   05-10-20: wbc 6.2; hgb 14.8; hct 44.7; mcv 94.9 plt 251; glucose 122; bun 14; creat 0.67; k+ 3.5; na++ 140 ca 8.9 GFR>60; alk phos 146; albumin 3.9  07-27-20: wbc 6.9; hgb 13.8; hct 41.9; mcv 95.2 plt 221; glucose 111; bun 19; creat 0.82; k+ 3.4; na++ 139; ca 8.9; GFR>60; liver normal albumin 3.6; chol 110; ldl 45; trig 107 hdl 44 07-30-20: k+ 3.6  09-13-20: wbc 8.3; hgb 17.2; hct 52.6 mcv 95.8 plt 248; glucose 122; bun 15; creat 0.86; k+ 4.5; na++ 139; ca 9.9; GFR>60  12-13-20: urine culture multiple species 01-17-21: wbc 7.1; hgb 14.8; hct 43.5; mcv 93.8 plt 239 01-21-21: wbc 7.7; hgb 15.9; hct 46.8; mcv 92.3 plt 260; glucose 117; bun 13; creat 0.80; k+ 3.3; na++ 138; ca 9.2; GFR>60 liver normal albumin 4.2  01-24-21: glucose 107; bun 23; creat 1.01; k+ 3.4; na++ 137; ca 8.8; GFR >58   NO NEW LABS.   Review of Systems  Constitutional:  Negative for malaise/fatigue.  Respiratory:  Negative for cough and shortness of breath.   Cardiovascular:  Negative for chest pain, palpitations and leg swelling.  Gastrointestinal:  Negative for abdominal pain, constipation and heartburn.  Musculoskeletal:  Positive for back pain. Negative for joint pain and myalgias.  Skin: Negative.   Neurological:  Negative for dizziness.  Psychiatric/Behavioral:  The patient is not nervous/anxious.    Physical Exam Constitutional:      General: She is not in acute distress.    Appearance: She is well-developed. She is not diaphoretic.  Neck:  Thyroid: No thyromegaly.  Cardiovascular:     Rate and Rhythm: Normal rate and regular rhythm.     Pulses: Normal pulses.     Heart sounds: Normal heart sounds.  Pulmonary:     Effort: Pulmonary effort is normal. No respiratory distress.     Breath sounds: Normal breath sounds.  Abdominal:      General: Bowel sounds are normal. There is no distension.     Palpations: Abdomen is soft.     Tenderness: There is no abdominal tenderness.  Musculoskeletal:     Cervical back: Neck supple.     Right lower leg: No edema.     Left lower leg: No edema.     Comments:  Has left side weakness Leans head to the left  Bilateral hand tremor left >right  Has bilateral eyebrow movement Right arm shrug  Has mild tenderness to bilateral lower back.   Lymphadenopathy:     Cervical: No cervical adenopathy.  Skin:    General: Skin is warm and dry.  Neurological:     Mental Status: She is alert and oriented to person, place, and time.  Psychiatric:        Mood and Affect: Mood normal.      ASSESSMENT/ PLAN:  TODAY  Chronic bilateral lower back pain without sciatica: is worse will use salon pas 2 patches to lower back daily and will monitor her status.    Ok Edwards NP [facility]  Contact [phone] Monday through Friday 8am- 5pm  After hours call [phone]

## 2021-03-26 ENCOUNTER — Other Ambulatory Visit: Payer: Self-pay | Admitting: Adult Health

## 2021-03-26 MED ORDER — ALPRAZOLAM 0.25 MG PO TABS
0.2500 mg | ORAL_TABLET | Freq: Two times a day (BID) | ORAL | 0 refills | Status: DC
Start: 2021-03-26 — End: 2021-04-25

## 2021-04-05 ENCOUNTER — Non-Acute Institutional Stay (SKILLED_NURSING_FACILITY): Payer: Medicare Other | Admitting: Internal Medicine

## 2021-04-05 ENCOUNTER — Encounter: Payer: Self-pay | Admitting: Internal Medicine

## 2021-04-05 DIAGNOSIS — N3281 Overactive bladder: Secondary | ICD-10-CM

## 2021-04-05 DIAGNOSIS — N182 Chronic kidney disease, stage 2 (mild): Secondary | ICD-10-CM | POA: Diagnosis not present

## 2021-04-05 DIAGNOSIS — D751 Secondary polycythemia: Secondary | ICD-10-CM

## 2021-04-05 DIAGNOSIS — I10 Essential (primary) hypertension: Secondary | ICD-10-CM

## 2021-04-05 DIAGNOSIS — F02B2 Dementia in other diseases classified elsewhere, moderate, with psychotic disturbance: Secondary | ICD-10-CM

## 2021-04-05 DIAGNOSIS — G20 Parkinson's disease: Secondary | ICD-10-CM

## 2021-04-05 NOTE — Assessment & Plan Note (Signed)
BP controlled; no change in antihypertensive medications  

## 2021-04-05 NOTE — Assessment & Plan Note (Signed)
Polycythemia has improved significantly with current H/H of 15.9/46.

## 2021-04-05 NOTE — Assessment & Plan Note (Signed)
She seems to validate urinary frequency.  She has exhibited mild hyperglycemia.  Updating A1c will be discussed with the [facility] NP.

## 2021-04-05 NOTE — Patient Instructions (Signed)
See assessment and plan under each diagnosis in the problem list and acutely for this visit 

## 2021-04-05 NOTE — Progress Notes (Signed)
NURSING HOME LOCATION: [facility] ROOM NUMBER:  108 D  CODE STATUS:  Full Code  PCP:  Ok Edwards NP  This is a nursing facility follow up visit of chronic medical diagnoses & to document compliance with Regulation 483.30 (c) in The [place] Manual Phase 2 which mandates caregiver visit ( visits can alternate among physician, PA or NP as per statutes) within 10 days of 30 days / 60 days/ 90 days post admission to SNF date    Interim medical record and care since last SNF visit was updated with review of diagnostic studies and change in clinical status since last visit were documented.  HPI: She is a permanent resident of facility with medical diagnoses of COPD, dyslipidemia, essential hypertension, Parkinson's, anxiety/depression, GERD, history of stroke, and sleep apnea. Significant surgeries and procedures include esophageal dilation, and neurostimulator procedures with subthalamic stimulator placement.  Current labs were reviewed.  She has CKD stage II with a creatinine of 0.77 and GFR greater than 60.  Serially the polycythemia has significantly improved.  H/H have dropped from prior values of 17.2/52.6 down to 15.9/46.  She has exhibited mild hyperglycemia.  There is no current A1c; the last on record was 5.6% on 03/30/2019.  There is no TSH in Epic.  Review of systems: Her voice is very weak and hyponasal in character.  The words are almost indiscernible.  She seems to indicate that her major problem is chronic back pain.  Apparently this is associated with incontinence of urine and stool.  Her other complaint was "cataracts".  When I asked about diabetic review of systems because of the mild hyperglycemia, she seemed to validate excess thirst and excess urination.  Constitutional: No fever, significant weight change  Eyes: No redness, discharge, pain ENT/mouth: No nasal congestion,  purulent discharge, earache, change in hearing, sore throat   Cardiovascular: No chest pain, palpitations, paroxysmal nocturnal dyspnea, claudication, edema  Respiratory: No cough, sputum production, hemoptysis, DOE, significant snoring, apnea   Gastrointestinal: No heartburn, dysphagia, abdominal pain, nausea /vomiting, rectal bleeding, melena, change in bowels Genitourinary: No dysuria, hematuria, pyuria Dermatologic: No rash, pruritus, change in appearance of skin Neurologic: No dizziness, headache, syncope, seizures, numbness Psychiatric: No significant anxiety, depression, insomnia, anorexia Endocrine: No change in hair/skin/nails Hematologic/lymphatic: No significant bruising, lymphadenopathy, abnormal bleeding  Physical exam:  Pertinent or positive findings: As noted speech is difficult to discern with a hyponasal, weak character.  Facies are blank except for grimacing countenance of her mouth.  The right mouth sags.  The right anterior incisor is missing; she is also missing most of the mandibular teeth.  There is torticollis of the neck.  Subcutaneous device is present at the right chest with extension of a shunt superiorly along the right lateral neck.  Heart sounds are distant and there is a slight tachycardia.  Breath sounds are decreased.  Pedal pulses are also decreased to palpation.  This is especially so on the right lower extremity.  She has a brace on the right lower extremity.  The right upper extremity seems to be slightly stronger than the left and the left lower extremity stronger than the right.  She has mild vitiliginous scarring over the left forearm.  At rest there is a coarse tremor of her head.  There is a almost rotary tremor of the left hand.  There is also tremor in the right hand intermittently.  There is side to side movement of the left lower extremity.  General appearance: no acute distress, increased work of breathing is present.   Lymphatic: No lymphadenopathy about the head, neck, axilla. Eyes: No conjunctival inflammation  or lid edema is present. There is no scleral icterus. Ears:  External ear exam shows no significant lesions or deformities.   Nose:  External nasal examination shows no deformity or inflammation. Nasal mucosa are pink and moist without lesions, exudates Oral exam: There is no oropharyngeal erythema or exudate. Neck:  No thyromegaly, masses, tenderness noted.    Heart:  No murmur, click, rub .  Lungs:  without wheezes, rhonchi, rales, rubs. Abdomen: Bowel sounds are normal. Abdomen is soft and nontender with no organomegaly, hernias, masses. GU: Deferred  Extremities:  No cyanosis, clubbing, edema  Neurologic exam :Balance, Rhomberg, finger to nose testing could not be completed due to clinical state Skin: Warm & dry w/o tenting. No significant  rash.  See summary under each active problem in the Problem List with associated updated therapeutic plan

## 2021-04-05 NOTE — Assessment & Plan Note (Addendum)
Again it is difficult to verify her review of system responses.  Speech is hyponasal and weak in character and the words virtually indiscernible for the most part.

## 2021-04-05 NOTE — Assessment & Plan Note (Signed)
Current creatinine 0.77 with a GFR greater than 60.  She is on polypharmacy; there is no indication for any dosage adjustments based on renal function at this time.

## 2021-04-11 ENCOUNTER — Other Ambulatory Visit (HOSPITAL_COMMUNITY)
Admission: RE | Admit: 2021-04-11 | Discharge: 2021-04-11 | Disposition: A | Discharge status: Home / Self Care | Payer: Medicare Other | Source: Skilled Nursing Facility | Attending: Adult Health | Admitting: Adult Health

## 2021-04-11 DIAGNOSIS — I10 Essential (primary) hypertension: Secondary | ICD-10-CM | POA: Insufficient documentation

## 2021-04-11 DIAGNOSIS — R7303 Prediabetes: Secondary | ICD-10-CM | POA: Insufficient documentation

## 2021-04-11 LAB — TSH: TSH: 1.431 u[IU]/mL (ref 0.350–4.500)

## 2021-04-11 LAB — T4, FREE: Free T4: 1.12 ng/dL (ref 0.61–1.12)

## 2021-04-11 LAB — HEMOGLOBIN A1C
Hgb A1c MFr Bld: 5.5 % (ref 4.8–5.6)
Mean Plasma Glucose: 111.15 mg/dL

## 2021-04-25 ENCOUNTER — Other Ambulatory Visit: Payer: Self-pay | Admitting: Adult Health

## 2021-04-25 MED ORDER — ALPRAZOLAM 0.25 MG PO TABS: 0.2500 mg | ORAL_TABLET | Freq: Two times a day (BID) | ORAL | 0 refills | Status: DC

## 2021-05-01 ENCOUNTER — Encounter: Payer: Self-pay | Admitting: Adult Health

## 2021-05-01 ENCOUNTER — Non-Acute Institutional Stay (SKILLED_NURSING_FACILITY): Payer: Medicare Other | Admitting: Adult Health

## 2021-05-01 DIAGNOSIS — F482 Pseudobulbar affect: Secondary | ICD-10-CM

## 2021-05-01 DIAGNOSIS — G2401 Drug induced subacute dyskinesia: Secondary | ICD-10-CM

## 2021-05-01 DIAGNOSIS — N3281 Overactive bladder: Secondary | ICD-10-CM

## 2021-05-01 DIAGNOSIS — E782 Mixed hyperlipidemia: Secondary | ICD-10-CM | POA: Diagnosis not present

## 2021-05-01 NOTE — Progress Notes (Signed)
Location:  [place] Room Number: 108-D Place of Service:  SNF (31)   CODE STATUS: Full Code  Allergies  Allergen Reactions   Oxycodone Other (See Comments)    Can tolerate low dose mixed with tynlelol HALLUCINATIONS    Docosahexaenoic Acid Other (See Comments)   Eicosapentaenoic Acid    Eicosapentaenoic Acid (Epa)    Lutein Nausea And Vomiting   Omega-3 Fatty Acids Other (See Comments)   Other    Aspirin Nausea And Vomiting and Other (See Comments)    Severe stomach pain    Codeine Nausea Only    Makes her feel very sick     Chief Complaint  Patient presents with   Medical Management of Chronic Issues                  OAB (over active bladder)  Mixed hyperlipidemia: PBA:  Tardive dyskinesia    HPI:  She is a 75 year old long term resident of this facility being seen for the management of her chronic illnesses: OAB (over active bladder)  Mixed hyperlipidemia: PBA:  Tardive dyskinesia. There are no reports of uncontrolled pain; no changes in weight; appetite is stable.   Past Medical History:  Diagnosis Date   Anxiety    Arthritis    Complication of anesthesia    Depression    Depression with suicidal ideation 01/21/2021   Dysphasia    GERD (gastroesophageal reflux disease)    Hyperlipidemia    Hypertension    Parkinson's disease ([place])    diagnosed at age 75   Parkinson's disease dementia ([place]) 07/31/2020   PONV (postoperative nausea and vomiting)    states it has been a long time since getting sick   Sleep apnea    Has CPAP but doesnt use   Stroke [facility])    TIA (transient ischemic attack)    not really TIA but abnormal MRI per pt left sided weakness   UTI (lower urinary tract infection)    Vitamin D deficiency     Past Surgical History:  Procedure Laterality Date   BACK SURGERY     Battery change     DBS, 12/2009   BIOPSY N/A 11/29/2013   Procedure: GASTRIC BIOPSY;  Surgeon: Danie Binder, MD;  Location: [facility];  Service: Endoscopy;   Laterality: N/A;   BURR HOLE W/ STEREOTACTIC INSERTION OF DBS LEADS / INTRAOP MICROELECTRODE RECORDING     2007   CARPAL TUNNEL RELEASE Right    COLONOSCOPY WITH PROPOFOL N/A 11/29/2013   Procedure: COLONOSCOPY WITH PROPOFOL;  Surgeon: Danie Binder, MD;  Location: [facility];  Service: Endoscopy;  Laterality: N/A;  entered cecum @ [phone]; total cecal withdrawal time 21 minutes    ELBOW SURGERY Left    after fx   ESOPHAGEAL DILATION N/A 11/29/2013   Procedure: ESOPHAGEAL DILATION;  Surgeon: Danie Binder, MD;  Location: [facility];  Service: Endoscopy;  Laterality: N/A;  [place] 14/15/16   ESOPHAGOGASTRODUODENOSCOPY (EGD) WITH PROPOFOL N/A 11/29/2013   Procedure: ESOPHAGOGASTRODUODENOSCOPY (EGD) WITH PROPOFOL;  Surgeon: Danie Binder, MD;  Location: [facility];  Service: Endoscopy;  Laterality: N/A;   FOOT SURGERY Left    "something with 2nd 2."   NECK SURGERY     Fusion   POLYPECTOMY N/A 11/29/2013   Procedure: POLYPECTOMY;  Surgeon: Danie Binder, MD;  Location: [facility];  Service: Endoscopy;  Laterality: N/A;  Distal Transverse Colon x2    PR ANALYZE NEUROSTIM BRAIN, FIRST 1H  10/15/2012       PULSE GENERATOR IMPLANT Right 03/30/2018   Procedure: Right Implantable pulse generator change;  Surgeon: Erline Levine, MD;  Location: [place];  Service: Neurosurgery;  Laterality: Right;   SUBTHALAMIC STIMULATOR BATTERY REPLACEMENT N/A 08/05/2013   Procedure: SUBTHALAMIC STIMULATOR BATTERY REPLACEMENT;  Surgeon: Erline Levine, MD;  Location: [place] [facility];  Service: Neurosurgery;  Laterality: N/A;   SUBTHALAMIC STIMULATOR BATTERY REPLACEMENT N/A 11/26/2015   Procedure: Implantable pulse generator battery change for deep brain stimulator;  Surgeon: Erline Levine, MD;  Location: [place] [facility];  Service: Neurosurgery;  Laterality: N/A;   SUBTHALAMIC STIMULATOR BATTERY REPLACEMENT Right 09/13/2020   Procedure: Right chest implantable pulse generator change for depleted battery;  Surgeon: Erline Levine, MD;  Location: [place];   Service: Neurosurgery;  Laterality: Right;  3C/RM 18   VAGINAL HYSTERECTOMY     "partial"   WRIST SURGERY Right    after fx    Social History   Socioeconomic History   Marital status: Widowed    Spouse name: Not on file   Number of children: Not on file   Years of education: Not on file   Highest education level: Not on file  Occupational History   Occupation: Retired  Tobacco Use   Smoking status: Former    Packs/day: 0.50    Years: 25.00    Pack years: 12.50    Types: Cigarettes    Quit date: 03/01/2019    Years since quitting: 2.1   Smokeless tobacco: Never  Vaping Use   Vaping Use: Never used  Substance and Sexual Activity   Alcohol use: Not Currently   Drug use: No   Sexual activity: Not on file  Other Topics Concern   Not on file  Social History Narrative   She resides at [facility] (assisted living facility).   Social Determinants of Health   Financial Resource Strain: Not on file  Food Insecurity: Not on file  Transportation Needs: Not on file  Physical Activity: Not on file  Stress: Not on file  Social Connections: Not on file  Intimate Partner Violence: Not on file   Family History  Problem Relation Age of Onset   Colon cancer Paternal Uncle       VITAL SIGNS BP 130/70   Pulse 62   Temp 98 F (36.7 C)   Resp 20   Ht 5' 2"  (1.575 m)   Wt 163 lb 6.4 oz (74.1 kg)   SpO2 96%   BMI 29.89 kg/m   Outpatient Encounter Medications as of 05/01/2021  Medication Sig   acetaminophen (TYLENOL) 500 MG tablet Take 500 mg by mouth every 6 (six) hours as needed for mild pain or moderate pain.   albuterol (VENTOLIN HFA) 108 (90 Base) MCG/ACT inhaler Inhale 2 puffs into the lungs every 4 (four) hours as needed for wheezing or shortness of breath.   ALPRAZolam (XANAX) 0.25 MG tablet Take 1 tablet (0.25 mg total) by mouth 2 (two) times daily. Major depressive disorder, recurrent, unspecified   alum & mag hydroxide-simeth (MAALOX PLUS) 400-400-40 MG/5ML  suspension Take 20 mLs by mouth 2 (two) times daily as needed for indigestion.   amLODipine (NORVASC) 10 MG tablet Take 10 mg by mouth daily.    atorvastatin (LIPITOR) 40 MG tablet Take 1 tablet (40 mg total) by mouth daily.   BREZTRI AEROSPHERE 160-9-4.8 MCG/ACT AERO Inhale 2 puffs into the lungs 2 (two) times daily.   carbidopa-levodopa (SINEMET IR) 25-100 MG tablet Take 1  tablet by mouth 2 (two) times daily.   Cholecalciferol 50 MCG (2000 UT) CAPS Take 1 capsule by mouth daily.   clopidogrel (PLAVIX) 75 MG tablet Take 75 mg by mouth daily.   Dextromethorphan-quiNIDine (NUEDEXTA) 20-10 MG capsule Take 1 capsule by mouth 2 (two) times daily.   docusate sodium (COLACE) 100 MG capsule Take 100 mg by mouth daily.   donepezil (ARICEPT) 5 MG tablet Take 5 mg by mouth at bedtime. 8 pm for parkinson's dementia   DULoxetine (CYMBALTA) 60 MG capsule Take 60 mg by mouth daily.   Ensure (ENSURE) Take 120 mLs by mouth 2 (two) times daily between meals. 9 am and 9 pm   guaiFENesin (MUCINEX) 600 MG 12 hr tablet Take 600 mg by mouth daily as needed.   losartan (COZAAR) 100 MG tablet Take 100 mg by mouth daily.   meloxicam (MOBIC) 15 MG tablet Take 15 mg by mouth daily.   miconazole (ZEASORB-AF) 2 % powder Apply 1 application topically as needed for itching (Every Shift - PRN; PRN 1, PRN 2, PRN 3).   NON FORMULARY Diet: : Dys 3 (chopped) diet, continue thin liquids; will allow pizza if requested   omeprazole (PRILOSEC) 20 MG capsule Take 20 mg by mouth in the morning and at bedtime.    polyethylene glycol (MIRALAX / GLYCOLAX) 17 g packet Take 17 g by mouth daily as needed.   pramipexole (MIRAPEX) 0.5 MG tablet Take 0.5 mg by mouth at bedtime.   QUEtiapine (SEROQUEL) 25 MG tablet Take 25 mg by mouth daily.   traZODone (DESYREL) 100 MG tablet Take 100 mg by mouth at bedtime.   valbenazine (INGREZZA) 40 MG capsule Take 40 mg by mouth at bedtime.   No facility-administered encounter medications on file as of  05/01/2021.     SIGNIFICANT DIAGNOSTIC EXAMS   LABS REVIEWED; PREVIOUS   05-10-20: wbc 6.2; hgb 14.8; hct 44.7; mcv 94.9 plt 251; glucose 122; bun 14; creat 0.67; k+ 3.5; na++ 140 ca 8.9 GFR>60; alk phos 146; albumin 3.9  07-27-20: wbc 6.9; hgb 13.8; hct 41.9; mcv 95.2 plt 221; glucose 111; bun 19; creat 0.82; k+ 3.4; na++ 139; ca 8.9; GFR>60; liver normal albumin 3.6; chol 110; ldl 45; trig 107 hdl 44 07-30-20: k+ 3.6  09-13-20: wbc 8.3; hgb 17.2; hct 52.6 mcv 95.8 plt 248; glucose 122; bun 15; creat 0.86; k+ 4.5; na++ 139; ca 9.9; GFR>60  12-13-20: urine culture multiple species 01-17-21: wbc 7.1; hgb 14.8; hct 43.5; mcv 93.8 plt 239 01-21-21: wbc 7.7; hgb 15.9; hct 46.8; mcv 92.3 plt 260; glucose 117; bun 13; creat 0.80; k+ 3.3; na++ 138; ca 9.2; GFR>60 liver normal albumin 4.2  01-24-21: glucose 107; bun 23; creat 1.01; k+ 3.4; na++ 137; ca 8.8; GFR >58   TODAY  02-14-21: glucose 137; bun 19; creat 0.77; k+ 3.7; na++ 137; ca 8.9; GFR>60 04-11-21: hgb a1c 5.5; tsh 1.431 free t4: 1.12   Review of Systems  Constitutional:  Negative for malaise/fatigue.  Respiratory:  Negative for cough and shortness of breath.   Cardiovascular:  Negative for chest pain, palpitations and leg swelling.  Gastrointestinal:  Negative for abdominal pain, constipation and heartburn.  Musculoskeletal:  Negative for back pain, joint pain and myalgias.  Skin: Negative.   Neurological:  Negative for dizziness.  Psychiatric/Behavioral:  The patient is not nervous/anxious.     Physical Exam Constitutional:      General: She is not in acute distress.    Appearance: She is well-developed.  She is not diaphoretic.  Neck:     Thyroid: No thyromegaly.  Cardiovascular:     Rate and Rhythm: Normal rate and regular rhythm.     Pulses: Normal pulses.     Heart sounds: Normal heart sounds.  Pulmonary:     Effort: Pulmonary effort is normal. No respiratory distress.     Breath sounds: Normal breath sounds.  Abdominal:      General: Bowel sounds are normal. There is no distension.     Palpations: Abdomen is soft.     Tenderness: There is no abdominal tenderness.  Musculoskeletal:     Cervical back: Neck supple.     Right lower leg: No edema.     Left lower leg: No edema.     Comments: Has left side weakness Leans head to the left  Bilateral hand tremor left >right  Has bilateral eyebrow movement Right arm shrug  Has mild tenderness to bilateral lower back.    Lymphadenopathy:     Cervical: No cervical adenopathy.  Skin:    General: Skin is warm and dry.  Neurological:     Mental Status: She is alert. Mental status is at baseline.  Psychiatric:        Mood and Affect: Mood normal.    ASSESSMENT/ PLAN:  TODAY  OAB (over active bladder)  is completely incontinent is off ditropan  2. Mixed hyperlipidemia: is stable LDL 44 will continue lipitor 40 mg daily   3. PBA: is stable will continue nuedexta 10/20 mg twice daily   4. Tardive dyskinesia: is stable will continue ingrezza 40 mg daily   PREVIOUS   5. 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   6. COPD without exacerbation: is stable will continue breztri aerosphere 160-9-4.8 mcg 2 puffs twice daily has albuterol 2 puffs every 4 hours as needed  7. GERD without esophagitis: is stable will continue prilosec 20 mg twice daily   8. Chronic constipation: is stable will continue colace 100 mg every day; miralax daily as needed  9. Chronic pain syndrome: is stable will continue cymbalta 60 mg daily (also takes for mood state)[place] pas 2 patches to lower back   10. Parkinson's disease dementia; is without change; weight is 163 pounds will continue aricept 5 mg daily   11. Essential hypertension: is stable b/p 130/70 will  continue norvasc 10 mg daily and cozaar 100 mg daily   12. Parkinson's disease: is stable status post deep vein stimulator; is without change will continue sinemet IR 25/100 mg twice daily and mirapex 0.5 mg nightly    Ok Edwards NP [facility]  Contact [phone] Monday through Friday 8am- 5pm  After hours call [phone]

## 2021-05-06 DIAGNOSIS — G2401 Drug induced subacute dyskinesia: Secondary | ICD-10-CM | POA: Insufficient documentation

## 2021-05-10 ENCOUNTER — Non-Acute Institutional Stay (SKILLED_NURSING_FACILITY): Payer: Medicare Other | Admitting: Adult Health

## 2021-05-10 ENCOUNTER — Encounter: Payer: Self-pay | Admitting: Adult Health

## 2021-05-10 DIAGNOSIS — J449 Chronic obstructive pulmonary disease, unspecified: Secondary | ICD-10-CM | POA: Diagnosis not present

## 2021-05-10 DIAGNOSIS — F323 Major depressive disorder, single episode, severe with psychotic features: Secondary | ICD-10-CM | POA: Diagnosis not present

## 2021-05-10 DIAGNOSIS — G20 Parkinson's disease: Secondary | ICD-10-CM

## 2021-05-10 NOTE — Progress Notes (Signed)
Location:  [place] Room Number: 108-D Place of Service:  SNF (31)   CODE STATUS: Full Code  Allergies  Allergen Reactions   Oxycodone Other (See Comments)    Can tolerate low dose mixed with tynlelol HALLUCINATIONS    Docosahexaenoic Acid Other (See Comments)   Eicosapentaenoic Acid    Eicosapentaenoic Acid (Epa)    Lutein Nausea And Vomiting   Omega-3 Fatty Acids Other (See Comments)   Other    Aspirin Nausea And Vomiting and Other (See Comments)    Severe stomach pain    Codeine Nausea Only    Makes her feel very sick     Chief Complaint  Patient presents with   Acute Visit    Care plan meeting    HPI:  We have come together for her care plan meeting. Family present  BIMS 15/15 mood 9/30: trouble concentrating; anxious; littler energy; some depression. She does have occasional crying episodes. She requires extensive to dependent for adls. Incontinent of bladder frequently incontinent of bowel. Non ambulatory no falls. Dietary: D3 chopped; appetite variable; weight is 163.4 pounds and is stable. Therapy none at this time. 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 Parkinson's disease  Past Medical History:  Diagnosis Date   Anxiety    Arthritis    Complication of anesthesia    Depression    Depression with suicidal ideation 01/21/2021   Dysphasia    GERD (gastroesophageal reflux disease)    Hyperlipidemia    Hypertension    Parkinson's disease ([place])    diagnosed at age 42   Parkinson's disease dementia ([place]) 07/31/2020   PONV (postoperative nausea and vomiting)    states it has been a long time since getting sick   Sleep apnea    Has CPAP but doesnt use   Stroke ([place])    TIA (transient ischemic attack)    not really TIA but abnormal MRI per pt left sided weakness   UTI (lower urinary tract infection)    Vitamin D deficiency     Past  Surgical History:  Procedure Laterality Date   BACK SURGERY     Battery change     DBS, 12/2009   BIOPSY N/A 11/29/2013   Procedure: GASTRIC BIOPSY;  Surgeon: Danie Binder, MD;  Location: [facility];  Service: Endoscopy;  Laterality: N/A;   BURR HOLE W/ STEREOTACTIC INSERTION OF DBS LEADS / INTRAOP MICROELECTRODE RECORDING     2007   CARPAL TUNNEL RELEASE Right    COLONOSCOPY WITH PROPOFOL N/A 11/29/2013   Procedure: COLONOSCOPY WITH PROPOFOL;  Surgeon: Danie Binder, MD;  Location: [facility];  Service: Endoscopy;  Laterality: N/A;  entered cecum @ [phone]; total cecal withdrawal time 21 minutes    ELBOW SURGERY Left    after fx   ESOPHAGEAL DILATION N/A 11/29/2013   Procedure: ESOPHAGEAL DILATION;  Surgeon: Danie Binder, MD;  Location: [facility];  Service: Endoscopy;  Laterality: N/A;  [place] 14/15/16   ESOPHAGOGASTRODUODENOSCOPY (EGD) WITH PROPOFOL N/A 11/29/2013   Procedure: ESOPHAGOGASTRODUODENOSCOPY (EGD) WITH PROPOFOL;  Surgeon: Danie Binder, MD;  Location: [facility];  Service: Endoscopy;  Laterality: N/A;   FOOT SURGERY Left    "something with 2nd 2."   NECK SURGERY     Fusion   POLYPECTOMY N/A 11/29/2013   Procedure: POLYPECTOMY;  Surgeon: Danie Binder, MD;  Location: [facility];  Service: Endoscopy;  Laterality: N/A;  Distal Transverse Colon x2    PR ANALYZE NEUROSTIM BRAIN, FIRST 1H  10/15/2012       PULSE GENERATOR IMPLANT Right 03/30/2018   Procedure: Right Implantable pulse generator change;  Surgeon: Erline Levine, MD;  Location: [place];  Service: Neurosurgery;  Laterality: Right;   SUBTHALAMIC STIMULATOR BATTERY REPLACEMENT N/A 08/05/2013   Procedure: SUBTHALAMIC STIMULATOR BATTERY REPLACEMENT;  Surgeon: Erline Levine, MD;  Location: [place] [facility];  Service: Neurosurgery;  Laterality: N/A;   SUBTHALAMIC STIMULATOR BATTERY REPLACEMENT N/A 11/26/2015   Procedure: Implantable pulse generator battery change for deep brain stimulator;  Surgeon: Erline Levine, MD;  Location: [place] [facility];  Service:  Neurosurgery;  Laterality: N/A;   SUBTHALAMIC STIMULATOR BATTERY REPLACEMENT Right 09/13/2020   Procedure: Right chest implantable pulse generator change for depleted battery;  Surgeon: Erline Levine, MD;  Location: [facility];  Service: Neurosurgery;  Laterality: Right;  3C/RM 18   VAGINAL HYSTERECTOMY     "partial"   WRIST SURGERY Right    after fx    Social History   Socioeconomic History   Marital status: Widowed    Spouse name: Not on file   Number of children: Not on file   Years of education: Not on file   Highest education level: Not on file  Occupational History   Occupation: Retired  Tobacco Use   Smoking status: Former    Packs/day: 0.50    Years: 25.00    Pack years: 12.50    Types: Cigarettes    Quit date: 03/01/2019    Years since quitting: 2.1   Smokeless tobacco: Never  Vaping Use   Vaping Use: Never used  Substance and Sexual Activity   Alcohol use: Not Currently   Drug use: No   Sexual activity: Not on file  Other Topics Concern   Not on file  Social History Narrative   She resides at [facility] (assisted living facility).   Social Determinants of Health   Financial Resource Strain: Not on file  Food Insecurity: Not on file  Transportation Needs: Not on file  Physical Activity: Not on file  Stress: Not on file  Social Connections: Not on file  Intimate Partner Violence: Not on file   Family History  Problem Relation Age of Onset   Colon cancer Paternal Uncle       VITAL SIGNS BP (!) 117/54    Pulse 78    Temp 97.8 F (36.6 C)    Resp 20    Ht 5' 2"  (1.575 m)    Wt 163 lb 6.4 oz (74.1 kg)    SpO2 95%    BMI 29.89 kg/m   Outpatient Encounter Medications as of 05/10/2021  Medication Sig   acetaminophen (TYLENOL) 500 MG tablet Take 500 mg by mouth every 6 (six) hours as needed for mild pain or moderate pain.   albuterol (VENTOLIN HFA) 108 (90 Base) MCG/ACT inhaler Inhale 2 puffs into the lungs every 4 (four) hours as needed for wheezing or  shortness of breath.   ALPRAZolam (XANAX) 0.25 MG tablet Take 1 tablet (0.25 mg total) by mouth 2 (two) times daily. Major depressive disorder, recurrent, unspecified   amLODipine (NORVASC) 10 MG tablet Take 10 mg by mouth daily.    atorvastatin (LIPITOR) 40 MG tablet Take 1 tablet (40 mg total) by mouth daily.   BREZTRI AEROSPHERE 160-9-4.8 MCG/ACT AERO Inhale 2 puffs into the lungs 2 (two) times daily.   Camphor-Menthol-Methyl Sal (SALONPAS) 3.05-31-08 % [facility] Special Instructions: apply one  pack to both sides of lower back daily and remove and at HS. for bilateral lower back pain. [DX: Chronic pain syndrome-per NP note]   carbidopa-levodopa (SINEMET IR) 25-100 MG tablet Take 1 tablet by mouth 2 (two) times daily.   Cholecalciferol 50 MCG (2000 UT) CAPS Take 1 capsule by mouth daily.   clopidogrel (PLAVIX) 75 MG tablet Take 75 mg by mouth daily.   Dextromethorphan-quiNIDine (NUEDEXTA) 20-10 MG capsule Take 1 capsule by mouth 2 (two) times daily.   docusate sodium (COLACE) 100 MG capsule Take 100 mg by mouth daily.   donepezil (ARICEPT) 5 MG tablet Take 5 mg by mouth at bedtime. 8 pm for parkinson's dementia   DULoxetine (CYMBALTA) 60 MG capsule Take 60 mg by mouth daily.   Ensure (ENSURE) Take 120 mLs by mouth 2 (two) times daily between meals. 9 am and 9 pm   guaiFENesin (MUCINEX) 600 MG 12 hr tablet Take 600 mg by mouth daily as needed.   hydrocortisone 1 % lotion Special Instructions: left upper arm Every Shift - PRN   losartan (COZAAR) 100 MG tablet Take 100 mg by mouth daily.   meloxicam (MOBIC) 15 MG tablet Take 15 mg by mouth daily.   miconazole (ZEASORB-AF) 2 % powder Apply 1 application topically as needed for itching (Every Shift - PRN; PRN 1, PRN 2, PRN 3).   NON FORMULARY Diet: : Dys 3 (chopped) diet, continue thin liquids; will allow pizza if requested   omeprazole (PRILOSEC) 20 MG capsule Take 20 mg by mouth in the morning and at bedtime.    polyethylene glycol (MIRALAX /  GLYCOLAX) 17 g packet Take 17 g by mouth daily as needed.   pramipexole (MIRAPEX) 0.5 MG tablet Take 0.5 mg by mouth at bedtime.   QUEtiapine (SEROQUEL) 25 MG tablet Take 25 mg by mouth daily.   traZODone (DESYREL) 100 MG tablet Take 100 mg by mouth at bedtime.   valbenazine (INGREZZA) 40 MG capsule Take 40 mg by mouth at bedtime.   alum & mag hydroxide-simeth (MAALOX PLUS) 400-400-40 MG/5ML suspension Take 20 mLs by mouth 2 (two) times daily as needed for indigestion.   No facility-administered encounter medications on file as of 05/10/2021.     SIGNIFICANT DIAGNOSTIC EXAMS   LABS REVIEWED; PREVIOUS   05-10-20: wbc 6.2; hgb 14.8; hct 44.7; mcv 94.9 plt 251; glucose 122; bun 14; creat 0.67; k+ 3.5; na++ 140 ca 8.9 GFR>60; alk phos 146; albumin 3.9  07-27-20: wbc 6.9; hgb 13.8; hct 41.9; mcv 95.2 plt 221; glucose 111; bun 19; creat 0.82; k+ 3.4; na++ 139; ca 8.9; GFR>60; liver normal albumin 3.6; chol 110; ldl 45; trig 107 hdl 44 07-30-20: k+ 3.6  09-13-20: wbc 8.3; hgb 17.2; hct 52.6 mcv 95.8 plt 248; glucose 122; bun 15; creat 0.86; k+ 4.5; na++ 139; ca 9.9; GFR>60  12-13-20: urine culture multiple species 01-17-21: wbc 7.1; hgb 14.8; hct 43.5; mcv 93.8 plt 239 01-21-21: wbc 7.7; hgb 15.9; hct 46.8; mcv 92.3 plt 260; glucose 117; bun 13; creat 0.80; k+ 3.3; na++ 138; ca 9.2; GFR>60 liver normal albumin 4.2  01-24-21: glucose 107; bun 23; creat 1.01; k+ 3.4; na++ 137; ca 8.8; GFR >58  02-14-21: glucose 137; bun 19; creat 0.77; k+ 3.7; na++ 137; ca 8.9; GFR>60 04-11-21: hgb a1c 5.5; tsh 1.431 free t4: 1.12  NO NEW LABS.   Review of Systems  Constitutional:  Negative for malaise/fatigue.  Respiratory:  Negative for cough and shortness of breath.   Cardiovascular:  Negative for chest pain, palpitations and leg swelling.  Gastrointestinal:  Negative for abdominal pain, constipation and heartburn.  Musculoskeletal:  Negative for back pain, joint pain and myalgias.  Skin: Negative.   Neurological:   Negative for dizziness.  Psychiatric/Behavioral:  The patient is not nervous/anxious.    Physical Exam Constitutional:      General: She is not in acute distress.    Appearance: She is well-developed. She is not diaphoretic.  Neck:     Thyroid: No thyromegaly.  Cardiovascular:     Rate and Rhythm: Normal rate and regular rhythm.     Pulses: Normal pulses.     Heart sounds: Normal heart sounds.  Pulmonary:     Effort: Pulmonary effort is normal. No respiratory distress.     Breath sounds: Normal breath sounds.  Abdominal:     General: Bowel sounds are normal. There is no distension.     Palpations: Abdomen is soft.     Tenderness: There is no abdominal tenderness.  Musculoskeletal:     Cervical back: Neck supple.     Right lower leg: No edema.     Left lower leg: No edema.     Comments:  Has left side weakness Leans head to the left  Bilateral hand tremor left >right  Has bilateral eyebrow movement Right arm shrug   Lymphadenopathy:     Cervical: No cervical adenopathy.  Skin:    General: Skin is warm and dry.  Neurological:     Mental Status: She is alert and oriented to person, place, and time.  Psychiatric:        Mood and Affect: Mood normal.     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   Time spent with patient: 40 minutes: medications; plan of care.    Ok Edwards NP [facility]  Contact [phone] Monday through Friday 8am- 5pm  After hours call [phone]

## 2021-05-28 ENCOUNTER — Other Ambulatory Visit: Payer: Self-pay | Admitting: Adult Health

## 2021-05-28 MED ORDER — ALPRAZOLAM 0.25 MG PO TABS: 0.2500 mg | ORAL_TABLET | Freq: Two times a day (BID) | ORAL | 0 refills | Status: DC

## 2021-05-29 ENCOUNTER — Non-Acute Institutional Stay (SKILLED_NURSING_FACILITY): Payer: Medicare Other | Admitting: Adult Health

## 2021-05-29 ENCOUNTER — Encounter: Payer: Self-pay | Admitting: Adult Health

## 2021-05-29 DIAGNOSIS — R45851 Suicidal ideations: Secondary | ICD-10-CM

## 2021-05-29 DIAGNOSIS — F32A Depression, unspecified: Secondary | ICD-10-CM

## 2021-05-29 DIAGNOSIS — K219 Gastro-esophageal reflux disease without esophagitis: Secondary | ICD-10-CM

## 2021-05-29 DIAGNOSIS — F323 Major depressive disorder, single episode, severe with psychotic features: Secondary | ICD-10-CM

## 2021-05-29 DIAGNOSIS — J449 Chronic obstructive pulmonary disease, unspecified: Secondary | ICD-10-CM | POA: Diagnosis not present

## 2021-05-29 NOTE — Progress Notes (Signed)
Location:  [place] Room Number: 108-D Place of Service:  SNF (31)   CODE STATUS: Full Code   Allergies  Allergen Reactions   Oxycodone Other (See Comments)    Can tolerate low dose mixed with tynlelol HALLUCINATIONS    Docosahexaenoic Acid Other (See Comments)   Eicosapentaenoic Acid    Eicosapentaenoic Acid (Epa)    Lutein Nausea And Vomiting   Omega-3 Fatty Acids Other (See Comments)   Other    Aspirin Nausea And Vomiting and Other (See Comments)    Severe stomach pain    Codeine Nausea Only    Makes her feel very sick     Chief Complaint  Patient presents with   Medical Management of Chronic Issues           Severe major depression with psychotic features/depression with suicidal ideation   COPD with exacerbation:  GERD without exacerbation: Chronic constipation    HPI:  She is a 76 year old long term resident of this facility being seen for the management of her chronic illnesses: Severe major depression with psychotic features/depression with suicidal ideation   COPD with exacerbation:  GERD without exacerbation: Chronic constipation. There are no reports of uncontrolled pain; no changes in appetite; weight is stable. No reports of depressive thoughts or anxiety.   Past Medical History:  Diagnosis Date   Anxiety    Arthritis    Complication of anesthesia    Depression    Depression with suicidal ideation 01/21/2021   Dysphasia    GERD (gastroesophageal reflux disease)    Hyperlipidemia    Hypertension    Parkinson's disease ([place])    diagnosed at age 35   Parkinson's disease dementia ([place]) 07/31/2020   PONV (postoperative nausea and vomiting)    states it has been a long time since getting sick   Sleep apnea    Has CPAP but doesnt use   Stroke [facility])    TIA (transient ischemic attack)    not really TIA but abnormal MRI per pt left sided weakness   UTI (lower urinary tract infection)    Vitamin D deficiency     Past Surgical  History:  Procedure Laterality Date   BACK SURGERY     Battery change     DBS, 12/2009   BIOPSY N/A 11/29/2013   Procedure: GASTRIC BIOPSY;  Surgeon: Danie Binder, MD;  Location: [facility];  Service: Endoscopy;  Laterality: N/A;   BURR HOLE W/ STEREOTACTIC INSERTION OF DBS LEADS / INTRAOP MICROELECTRODE RECORDING     2007   CARPAL TUNNEL RELEASE Right    COLONOSCOPY WITH PROPOFOL N/A 11/29/2013   Procedure: COLONOSCOPY WITH PROPOFOL;  Surgeon: Danie Binder, MD;  Location: [facility];  Service: Endoscopy;  Laterality: N/A;  entered cecum @ [phone]; total cecal withdrawal time 21 minutes    ELBOW SURGERY Left    after fx   ESOPHAGEAL DILATION N/A 11/29/2013   Procedure: ESOPHAGEAL DILATION;  Surgeon: Danie Binder, MD;  Location: [facility];  Service: Endoscopy;  Laterality: N/A;  [place] 14/15/16   ESOPHAGOGASTRODUODENOSCOPY (EGD) WITH PROPOFOL N/A 11/29/2013   Procedure: ESOPHAGOGASTRODUODENOSCOPY (EGD) WITH PROPOFOL;  Surgeon: Danie Binder, MD;  Location: [facility];  Service: Endoscopy;  Laterality: N/A;   FOOT SURGERY Left    "something with 2nd 2."   NECK SURGERY     Fusion   POLYPECTOMY N/A 11/29/2013   Procedure: POLYPECTOMY;  Surgeon: Danie Binder, MD;  Location: [facility];  Service: Endoscopy;  Laterality: N/A;  Distal Transverse Colon x2    PR ANALYZE NEUROSTIM BRAIN, FIRST 1H  10/15/2012       PULSE GENERATOR IMPLANT Right 03/30/2018   Procedure: Right Implantable pulse generator change;  Surgeon: Erline Levine, MD;  Location: [place];  Service: Neurosurgery;  Laterality: Right;   SUBTHALAMIC STIMULATOR BATTERY REPLACEMENT N/A 08/05/2013   Procedure: SUBTHALAMIC STIMULATOR BATTERY REPLACEMENT;  Surgeon: Erline Levine, MD;  Location: [place] [facility];  Service: Neurosurgery;  Laterality: N/A;   SUBTHALAMIC STIMULATOR BATTERY REPLACEMENT N/A 11/26/2015   Procedure: Implantable pulse generator battery change for deep brain stimulator;  Surgeon: Erline Levine, MD;  Location: [place] [facility];  Service: Neurosurgery;   Laterality: N/A;   SUBTHALAMIC STIMULATOR BATTERY REPLACEMENT Right 09/13/2020   Procedure: Right chest implantable pulse generator change for depleted battery;  Surgeon: Erline Levine, MD;  Location: [place];  Service: Neurosurgery;  Laterality: Right;  3C/RM 18   VAGINAL HYSTERECTOMY     "partial"   WRIST SURGERY Right    after fx    Social History   Socioeconomic History   Marital status: Widowed    Spouse name: Not on file   Number of children: Not on file   Years of education: Not on file   Highest education level: Not on file  Occupational History   Occupation: Retired  Tobacco Use   Smoking status: Former    Packs/day: 0.50    Years: 25.00    Pack years: 12.50    Types: Cigarettes    Quit date: 03/01/2019    Years since quitting: 2.2   Smokeless tobacco: Never  Vaping Use   Vaping Use: Never used  Substance and Sexual Activity   Alcohol use: Not Currently   Drug use: No   Sexual activity: Not on file  Other Topics Concern   Not on file  Social History Narrative   She resides at [facility] (assisted living facility).   Social Determinants of Health   Financial Resource Strain: Not on file  Food Insecurity: Not on file  Transportation Needs: Not on file  Physical Activity: Not on file  Stress: Not on file  Social Connections: Not on file  Intimate Partner Violence: Not on file   Family History  Problem Relation Age of Onset   Colon cancer Paternal Uncle       VITAL SIGNS BP 124/69    Pulse 66    Temp 98.1 F (36.7 C)    Resp 18    Ht 5\' 2"  (1.575 m)    Wt 163 lb 6.4 oz (74.1 kg)    SpO2 92%    BMI 29.89 kg/m   Outpatient Encounter Medications as of 05/29/2021  Medication Sig   acetaminophen (TYLENOL) 500 MG tablet Take 500 mg by mouth every 6 (six) hours as needed for mild pain or moderate pain.   albuterol (VENTOLIN HFA) 108 (90 Base) MCG/ACT inhaler Inhale 2 puffs into the lungs every 4 (four) hours as needed for wheezing or shortness of breath.    ALPRAZolam (XANAX) 0.25 MG tablet Take 1 tablet (0.25 mg total) by mouth 2 (two) times daily. Major depressive disorder, recurrent, unspecified   amLODipine (NORVASC) 10 MG tablet Take 10 mg by mouth daily.    atorvastatin (LIPITOR) 40 MG tablet Take 1 tablet (40 mg total) by mouth daily.   BREZTRI AEROSPHERE 160-9-4.8 MCG/ACT AERO Inhale 2 puffs into the lungs 2 (two) times daily.   Camphor-Menthol-Methyl Sal (SALONPAS) 3.05-31-08 %  [facility] Special Instructions: apply one pack to both sides of lower back daily and remove and at HS. for bilateral lower back pain. [DX: Chronic pain syndrome-per NP note]   carbidopa-levodopa (SINEMET IR) 25-100 MG tablet Take 1 tablet by mouth 2 (two) times daily.   Cholecalciferol 50 MCG (2000 UT) CAPS Take 1 capsule by mouth daily.   clopidogrel (PLAVIX) 75 MG tablet Take 75 mg by mouth daily.   Dextromethorphan-quiNIDine (NUEDEXTA) 20-10 MG capsule Take 1 capsule by mouth 2 (two) times daily.   docusate sodium (COLACE) 100 MG capsule Take 100 mg by mouth daily.   donepezil (ARICEPT) 5 MG tablet Take 5 mg by mouth at bedtime. 8 pm for parkinson's dementia   DULoxetine (CYMBALTA) 60 MG capsule Take 60 mg by mouth daily.   Ensure (ENSURE) Take 120 mLs by mouth 2 (two) times daily between meals. 9 am and 9 pm   guaiFENesin (MUCINEX) 600 MG 12 hr tablet Take 600 mg by mouth daily as needed.   hydrocortisone 1 % lotion Special Instructions: left upper arm Every Shift - PRN   losartan (COZAAR) 100 MG tablet Take 100 mg by mouth daily.   meloxicam (MOBIC) 15 MG tablet Take 15 mg by mouth daily.   miconazole (ZEASORB-AF) 2 % powder Apply 1 application topically as needed for itching (Every Shift - PRN; PRN 1, PRN 2, PRN 3).   NON FORMULARY Diet: : Dys 3 (chopped) diet, continue thin liquids; will allow pizza if requested   omeprazole (PRILOSEC) 20 MG capsule Take 20 mg by mouth in the morning and at bedtime.    polyethylene glycol (MIRALAX / GLYCOLAX) 17 g packet Take 17 g  by mouth daily as needed.   pramipexole (MIRAPEX) 0.5 MG tablet Take 0.5 mg by mouth at bedtime.   QUEtiapine (SEROQUEL) 25 MG tablet Take 25 mg by mouth daily.   traZODone (DESYREL) 100 MG tablet Take 100 mg by mouth at bedtime.   valbenazine (INGREZZA) 40 MG capsule Take 40 mg by mouth at bedtime.   [DISCONTINUED] alum & mag hydroxide-simeth (MAALOX PLUS) 400-400-40 MG/5ML suspension Take 20 mLs by mouth 2 (two) times daily as needed for indigestion.   No facility-administered encounter medications on file as of 05/29/2021.     SIGNIFICANT DIAGNOSTIC EXAMS   LABS REVIEWED; PREVIOUS   07-27-20: wbc 6.9; hgb 13.8; hct 41.9; mcv 95.2 plt 221; glucose 111; bun 19; creat 0.82; k+ 3.4; na++ 139; ca 8.9; GFR>60; liver normal albumin 3.6; chol 110; ldl 45; trig 107 hdl 44 07-30-20: k+ 3.6  09-13-20: wbc 8.3; hgb 17.2; hct 52.6 mcv 95.8 plt 248; glucose 122; bun 15; creat 0.86; k+ 4.5; na++ 139; ca 9.9; GFR>60  12-13-20: urine culture multiple species 01-17-21: wbc 7.1; hgb 14.8; hct 43.5; mcv 93.8 plt 239 01-21-21: wbc 7.7; hgb 15.9; hct 46.8; mcv 92.3 plt 260; glucose 117; bun 13; creat 0.80; k+ 3.3; na++ 138; ca 9.2; GFR>60 liver normal albumin 4.2  01-24-21: glucose 107; bun 23; creat 1.01; k+ 3.4; na++ 137; ca 8.8; GFR >58  02-14-21: glucose 137; bun 19; creat 0.77; k+ 3.7; na++ 137; ca 8.9; GFR>60 04-11-21: hgb a1c 5.5; tsh 1.431 free t4: 1.12  NO NEW LABS.   Review of Systems  Constitutional:  Negative for malaise/fatigue.  Respiratory:  Negative for cough and shortness of breath.   Cardiovascular:  Negative for chest pain, palpitations and leg swelling.  Gastrointestinal:  Negative for abdominal pain, constipation and heartburn.  Musculoskeletal:  Negative for  back pain, joint pain and myalgias.  Skin: Negative.   Neurological:  Negative for dizziness.  Psychiatric/Behavioral:  The patient is not nervous/anxious.     Physical Exam Constitutional:      General: She is not in acute  distress.    Appearance: She is well-developed. She is not diaphoretic.  Neck:     Thyroid: No thyromegaly.  Cardiovascular:     Rate and Rhythm: Normal rate and regular rhythm.     Pulses: Normal pulses.     Heart sounds: Normal heart sounds.  Pulmonary:     Effort: Pulmonary effort is normal. No respiratory distress.     Breath sounds: Normal breath sounds.  Abdominal:     General: Bowel sounds are normal. There is no distension.     Palpations: Abdomen is soft.     Tenderness: There is no abdominal tenderness.  Musculoskeletal:     Cervical back: Neck supple.     Right lower leg: No edema.     Left lower leg: No edema.     Comments:  Has left side weakness Leans head to the left  Bilateral hand tremor left >right  Has bilateral eyebrow movement Right arm shrug    Lymphadenopathy:     Cervical: No cervical adenopathy.  Skin:    General: Skin is warm and dry.  Neurological:     Mental Status: She is alert. Mental status is at baseline.  Psychiatric:        Mood and Affect: Mood normal.     ASSESSMENT/ PLAN:  TODAY  Severe major depression with psychotic features/depression with suicidal ideation is stable will continue xanax 025 mg twice daily (has failed 2 weans) will continue cymbalta 60 mg daily trazodone 100 mg nightly sereoquel 25 mg nightly unable to tolerate buspar  2. COPD with exacerbation: is stable will continue breztri aerosphere 160-4.8 mcg 2 puffs twice daily has albuterol 2 puffs every 4 hours as needed   3. GERD without exacerbation: is stable will continue prilosec 20 mg daily   4. Chronic constipation: is stable will continue colace 100 mg every day miralax daily as needed   PREVIOUS   5. Chronic pain syndrome: is stable will continue cymbalta 60 mg daily (also takes for mood state)[place] 2 patches to lower back   6. Parkinson's disease dementia; is without change; weight is 163 pounds will continue aricept 5 mg daily   7. Essential  hypertension: is stable b/p 130/70 will continue norvasc 10 mg daily and cozaar 100 mg daily   8. Parkinson's disease: is stable status post deep vein stimulator; is without change will continue sinemet IR 25/100 mg twice daily and mirapex 0.5 mg nightly   9. OAB (over active bladder)  is completely incontinent is off ditropan  10. Mixed hyperlipidemia: is stable LDL 44 will continue lipitor 40 mg daily   11. PBA: is stable will continue nuedexta 10/20 mg twice daily   12. Tardive dyskinesia: is stable will continue ingrezza 40 mg daily     Ok Edwards NP [facility]  call [phone]

## 2021-05-30 ENCOUNTER — Other Ambulatory Visit (HOSPITAL_COMMUNITY)
Admission: RE | Admit: 2021-05-30 | Discharge: 2021-05-30 | Disposition: A | Discharge status: Home / Self Care | Payer: Medicare Other | Source: Skilled Nursing Facility | Attending: Adult Health | Admitting: Adult Health

## 2021-05-30 DIAGNOSIS — I10 Essential (primary) hypertension: Secondary | ICD-10-CM | POA: Diagnosis present

## 2021-05-30 LAB — BASIC METABOLIC PANEL
Anion gap: 8 (ref 5–15)
BUN: 26 mg/dL — ABNORMAL HIGH (ref 8–23)
CO2: 28 mmol/L (ref 22–32)
Calcium: 9.6 mg/dL (ref 8.9–10.3)
Chloride: 102 mmol/L (ref 98–111)
Creatinine, Ser: 0.95 mg/dL (ref 0.44–1.00)
GFR, Estimated: >60 mL/min (ref >60)
Glucose, Bld: 106 mg/dL — ABNORMAL HIGH (ref 70–99)
Potassium: 4 mmol/L (ref 3.5–5.1)
Sodium: 138 mmol/L (ref 135–145)

## 2021-06-27 ENCOUNTER — Other Ambulatory Visit: Payer: Self-pay | Admitting: Adult Health

## 2021-06-27 MED ORDER — ALPRAZOLAM 0.25 MG PO TABS: 0.2500 mg | ORAL_TABLET | Freq: Two times a day (BID) | ORAL | 0 refills | Status: DC

## 2021-06-28 ENCOUNTER — Encounter: Payer: Self-pay | Admitting: Internal Medicine

## 2021-06-28 ENCOUNTER — Non-Acute Institutional Stay (SKILLED_NURSING_FACILITY): Payer: Medicare Other | Admitting: Internal Medicine

## 2021-06-28 DIAGNOSIS — J449 Chronic obstructive pulmonary disease, unspecified: Secondary | ICD-10-CM | POA: Diagnosis not present

## 2021-06-28 DIAGNOSIS — G20 Parkinson's disease: Secondary | ICD-10-CM

## 2021-06-28 DIAGNOSIS — I10 Essential (primary) hypertension: Secondary | ICD-10-CM | POA: Diagnosis not present

## 2021-06-28 DIAGNOSIS — D751 Secondary polycythemia: Secondary | ICD-10-CM

## 2021-06-28 DIAGNOSIS — E782 Mixed hyperlipidemia: Secondary | ICD-10-CM

## 2021-06-28 DIAGNOSIS — N182 Chronic kidney disease, stage 2 (mild): Secondary | ICD-10-CM

## 2021-06-28 NOTE — Progress Notes (Signed)
° °  NURSING HOME LOCATION:  [facility] ROOM NUMBER:  87 W  CODE STATUS: Full code  PCP: Ok Edwards, NP  This is a nursing facility follow up visit of chronic medical diagnoses & to document compliance with Regulation 483.30 (c) in The [place] Manual Phase 2 which mandates caregiver visit ( visits can alternate among physician, PA or NP as per statutes) within 10 days of 30 days / 60 days/ 90 days post admission to SNF date    Interim medical record and care since last SNF visit was updated with review of diagnostic studies and change in clinical status since last visit were documented.  HPI: She is a permanent resident of this facility with medical diagnoses of GERD, dyslipidemia, essential hypertension, Parkinson's, OSA, history of TIA and history of stroke, vitamin D deficiency, chronic pain syndrome, polycythemia, COPD, and depression. Significant past surgical history includes spinal cord and deep brain stimulator implant . 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  LDL was at goal with a value of 45.  Review of systems: Marked aphasia precluded completion of review of systems.  When asked if she were having any acute or active symptoms she pointed to the right eye and then subsequently pointed to the left flank but could not elaborate.  Physical exam:  Pertinent or positive findings: She appears older than her stated age and chronically ill.  Facies are blank until she begins to speak ,then she will exhibit a grimacing countenance.  Hair is thin.  There is no sign of conjunctivitis or scleritis in the right eye.  The mouth sags to the right.  When she speaks the mouth deviates laterally to the right.  The right nasolabial fold is decreased.  Slight tremor of the lips is present.  Dentition is poor with multiple missing teeth.  The mandible is edentulous. There is slight increase in second heart sound.   Breath sounds are decreased.  Abdomen is protuberant.  Pedal pulses are decreased but palpable.  Interosseous wasting is present.  She has a course tremor of the lower extremities.  Right lower extremity is in a brace.  General appearance: no acute distress, increased work of breathing is present.   Lymphatic: No lymphadenopathy about the head, neck, axilla. Eyes: No conjunctival inflammation or lid edema is present. There is no scleral icterus. Ears:  External ear exam shows no significant lesions or deformities.   Nose:  External nasal examination shows no deformity or inflammation. Nasal mucosa are pink and moist without lesions, exudates Neck:  No thyromegaly, masses, tenderness noted.    Heart:  Normal rate and regular rhythm. S1 normal without gallop, murmur, click, rub .  Lungs:  without wheezes, rhonchi, rales, rubs. Abdomen: Bowel sounds are normal. Abdomen is soft and nontender with no organomegaly, hernias, masses. GU: Deferred  Extremities:  No cyanosis, clubbing, edema  Neurologic exam :Balance, Rhomberg, finger to nose testing could not be completed due to clinical state Skin: Warm & dry w/o tenting. No significant lesions or rash.  See summary under each active problem in the Problem List with associated updated therapeutic plan

## 2021-06-29 ENCOUNTER — Encounter: Payer: Self-pay | Admitting: Internal Medicine

## 2021-06-29 NOTE — Assessment & Plan Note (Signed)
Clinically there is no progression of the Parkinson's itself.  Most of her deficits appear to be complications of CVA.  The tremors which are noted are not parkinsonian in nature.  No change in parkinsonian therapy recommended.

## 2021-06-29 NOTE — Assessment & Plan Note (Signed)
LDL at goal with a value of 45.  No change in statin therapy indicated.

## 2021-06-29 NOTE — Assessment & Plan Note (Signed)
BP controlled; no change in antihypertensive medications  

## 2021-06-29 NOTE — Assessment & Plan Note (Addendum)
Polycythemia resolved; H/H15.9/46.8.

## 2021-06-29 NOTE — Assessment & Plan Note (Signed)
CKD is stable with stage II.  No change in medication therapy indicated.

## 2021-06-29 NOTE — Assessment & Plan Note (Signed)
Breath sounds decreased compatible with emphysema.  O2 sats adequate on room air.  No change indicated.

## 2021-06-29 NOTE — Patient Instructions (Signed)
See assessment and plan under each diagnosis in the problem list and acutely for this visit 

## 2021-07-12 ENCOUNTER — Encounter (HOSPITAL_COMMUNITY)
Admission: AD | Admit: 2021-07-12 | Discharge: 2021-07-12 | Disposition: A | Discharge status: Home / Self Care | Payer: Medicare Other | Source: Skilled Nursing Facility | Attending: Internal Medicine | Admitting: Internal Medicine

## 2021-07-12 DIAGNOSIS — Z03818 Encounter for observation for suspected exposure to other biological agents ruled out: Secondary | ICD-10-CM | POA: Diagnosis present

## 2021-07-12 LAB — CBC
HCT: 46 % (ref 36.0–46.0)
Hemoglobin: 15.5 g/dL — ABNORMAL HIGH (ref 12.0–15.0)
MCH: 32.8 pg (ref 26.0–34.0)
MCHC: 33.7 g/dL (ref 30.0–36.0)
MCV: 97.5 fL (ref 80.0–100.0)
Platelets: 252 10*3/uL (ref 150–400)
RBC: 4.72 MIL/uL (ref 3.87–5.11)
RDW: 12.4 % (ref 11.5–15.5)
WBC: 7.5 10*3/uL (ref 4.0–10.5)
nRBC: 0 % (ref 0.0–0.2)

## 2021-07-12 LAB — D-DIMER, QUANTITATIVE: D-Dimer, Quant: 0.33 ug/mL-FEU (ref 0.00–0.50)

## 2021-07-12 LAB — BASIC METABOLIC PANEL
Anion gap: 13 (ref 5–15)
BUN: 31 mg/dL — ABNORMAL HIGH (ref 8–23)
CO2: 25 mmol/L (ref 22–32)
Calcium: 9.5 mg/dL (ref 8.9–10.3)
Chloride: 100 mmol/L (ref 98–111)
Creatinine, Ser: 0.98 mg/dL (ref 0.44–1.00)
GFR, Estimated: >60 mL/min (ref >60)
Glucose, Bld: 112 mg/dL — ABNORMAL HIGH (ref 70–99)
Potassium: 4 mmol/L (ref 3.5–5.1)
Sodium: 138 mmol/L (ref 135–145)

## 2021-07-13 LAB — C-REACTIVE PROTEIN: CRP: 0.8 mg/dL (ref <1.0)

## 2021-07-15 ENCOUNTER — Encounter: Payer: Self-pay | Admitting: Adult Health

## 2021-07-15 ENCOUNTER — Non-Acute Institutional Stay (SKILLED_NURSING_FACILITY): Payer: Medicare Other | Admitting: Adult Health

## 2021-07-15 DIAGNOSIS — U071 COVID-19: Secondary | ICD-10-CM | POA: Diagnosis not present

## 2021-07-15 NOTE — Progress Notes (Signed)
Location:  [place] Room Number: 108-W Place of Service:  SNF (31)   CODE STATUS: Full Code  Allergies  Allergen Reactions   Oxycodone Other (See Comments)    Can tolerate low dose mixed with tynlelol HALLUCINATIONS    Docosahexaenoic Acid Other (See Comments)   Eicosapentaenoic Acid    Eicosapentaenoic Acid (Epa)    Lutein Nausea And Vomiting   Omega-3 Fatty Acids Other (See Comments)   Other    Aspirin Nausea And Vomiting and Other (See Comments)    Severe stomach pain    Codeine Nausea Only    Makes her feel very sick     Chief Complaint  Patient presents with   Acute Visit    COVID positive    HPI:  She has tested positive for covid. There are no reports of coughing; no shortness of breath; no fevers. There are no indications of pain present. There are no reports of pain present. There are no reports of fever present.   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   PONV (postoperative nausea and vomiting)    states it has been a long time since getting sick   Sleep apnea    Has CPAP but doesnt use   Stroke [facility])    TIA (transient ischemic attack)    not really TIA but abnormal MRI per pt left sided weakness   UTI (lower urinary tract infection)    Vitamin D deficiency     Past Surgical History:  Procedure Laterality Date   BACK SURGERY     Battery change     DBS, 12/2009   BIOPSY N/A 11/29/2013   Procedure: GASTRIC BIOPSY;  Surgeon: Danie Binder, MD;  Location: [facility];  Service: Endoscopy;  Laterality: N/A;   BURR HOLE W/ STEREOTACTIC INSERTION OF DBS LEADS / INTRAOP MICROELECTRODE RECORDING     2007   CARPAL TUNNEL RELEASE Right     COLONOSCOPY WITH PROPOFOL N/A 11/29/2013   Procedure: COLONOSCOPY WITH PROPOFOL;  Surgeon: Danie Binder, MD;  Location: [facility];  Service: Endoscopy;  Laterality: N/A;  entered cecum @ [phone]; total cecal withdrawal time 21 minutes    ELBOW SURGERY Left    after fx   ESOPHAGEAL DILATION N/A 11/29/2013   Procedure: ESOPHAGEAL DILATION;  Surgeon: Danie Binder, MD;  Location: [facility];  Service: Endoscopy;  Laterality: N/A;  [place] 14/15/16   ESOPHAGOGASTRODUODENOSCOPY (EGD) WITH PROPOFOL N/A 11/29/2013   Procedure: ESOPHAGOGASTRODUODENOSCOPY (EGD) WITH PROPOFOL;  Surgeon: Danie Binder, MD;  Location: [facility];  Service: Endoscopy;  Laterality: N/A;   FOOT SURGERY Left    "something with 2nd 2."   NECK SURGERY     Fusion   POLYPECTOMY N/A 11/29/2013   Procedure: POLYPECTOMY;  Surgeon: Danie Binder, MD;  Location: [facility];  Service: Endoscopy;  Laterality: N/A;  Distal Transverse Colon x2    PR ANALYZE NEUROSTIM BRAIN, FIRST 1H  10/15/2012       PULSE GENERATOR IMPLANT Right 03/30/2018   Procedure: Right Implantable pulse generator change;  Surgeon: Erline Levine, MD;  Location: [place];  Service: Neurosurgery;  Laterality: Right;   SUBTHALAMIC STIMULATOR BATTERY REPLACEMENT N/A 08/05/2013   Procedure: SUBTHALAMIC STIMULATOR BATTERY REPLACEMENT;  Surgeon: Erline Levine, MD;  Location: [place] [facility];  Service: Neurosurgery;  Laterality: N/A;   SUBTHALAMIC STIMULATOR BATTERY REPLACEMENT N/A 11/26/2015   Procedure: Implantable pulse generator battery change for deep brain stimulator;  Surgeon: Erline Levine, MD;  Location: [place] [facility];  Service: Neurosurgery;  Laterality: N/A;   SUBTHALAMIC STIMULATOR BATTERY REPLACEMENT Right 09/13/2020   Procedure: Right chest implantable pulse generator change for depleted battery;  Surgeon: Erline Levine, MD;  Location: [place];  Service: Neurosurgery;  Laterality: Right;  3C/RM 18   VAGINAL HYSTERECTOMY     "partial"   WRIST SURGERY Right    after fx    Social History    Socioeconomic History   Marital status: Widowed    Spouse name: Not on file   Number of children: Not on file   Years of education: Not on file   Highest education level: Not on file  Occupational History   Occupation: Retired  Tobacco Use   Smoking status: Former    Packs/day: 0.50    Years: 25.00    Pack years: 12.50    Types: Cigarettes    Quit date: 03/01/2019    Years since quitting: 2.3   Smokeless tobacco: Never  Vaping Use   Vaping Use: Never used  Substance and Sexual Activity   Alcohol use: Not Currently   Drug use: No   Sexual activity: Not on file  Other Topics Concern   Not on file  Social History Narrative   She resides at [facility] (assisted living facility).   Social Determinants of Health   Financial Resource Strain: Not on file  Food Insecurity: Not on file  Transportation Needs: Not on file  Physical Activity: Not on file  Stress: Not on file  Social Connections: Not on file  Intimate Partner Violence: Not on file   Family History  Problem Relation Age of Onset   Colon cancer Paternal Uncle       VITAL SIGNS BP 140/82    Pulse 64    Temp 97.7 F (36.5 C)    Resp 18    Ht 5\' 2"  (1.575 m)    Wt 159 lb 6.4 oz (72.3 kg)    SpO2 94%    BMI 29.15 kg/m   Outpatient Encounter Medications as of 07/15/2021  Medication Sig   acetaminophen (TYLENOL) 500 MG tablet Take 500 mg by mouth every 6 (six) hours as needed for mild pain or moderate pain.   albuterol (VENTOLIN HFA) 108 (90 Base) MCG/ACT inhaler Inhale 2 puffs into the lungs every 4 (four) hours as needed for wheezing or shortness of breath.   ALPRAZolam (XANAX) 0.25 MG tablet Take 1 tablet (0.25 mg total) by mouth 2 (two) times daily. Major depressive disorder, recurrent, unspecified   amLODipine (NORVASC) 10 MG tablet Take 10 mg by mouth daily.    atorvastatin (LIPITOR) 40 MG tablet Take 1 tablet (40 mg total) by mouth daily.   BREZTRI AEROSPHERE 160-9-4.8 MCG/ACT AERO Inhale 2 puffs into the  lungs 2 (two) times daily.   Camphor-Menthol-Methyl Sal (SALONPAS) 3.05-31-08 % [facility] Special Instructions: apply one pack to both sides of lower back daily and remove and at HS. for bilateral lower back pain. [DX: Chronic pain syndrome-per NP note]   carbidopa-levodopa (SINEMET IR) 25-100 MG tablet Take 1 tablet by mouth 2 (two) times daily.   Cholecalciferol 50 MCG (2000 UT)  CAPS Take 1 capsule by mouth daily.   clopidogrel (PLAVIX) 75 MG tablet Take 75 mg by mouth daily.   Dextromethorphan-quiNIDine (NUEDEXTA) 20-10 MG capsule Take 1 capsule by mouth 2 (two) times daily.   docusate sodium (COLACE) 100 MG capsule Take 100 mg by mouth daily.   donepezil (ARICEPT) 5 MG tablet Take 5 mg by mouth at bedtime. 9 pm for parkinson's dementia   DULoxetine (CYMBALTA) 60 MG capsule Take 60 mg by mouth daily.   Ensure (ENSURE) Take 120 mLs by mouth 2 (two) times daily between meals. 9 am and 9 pm   ergocalciferol (VITAMIN D2) 1.25 MG (50000 UT) capsule Take 50,000 Units by mouth once a week.   guaiFENesin (MUCINEX) 600 MG 12 hr tablet Take 600 mg by mouth daily as needed.   Homeopathic Products (ZINC) LOZG (with A and C) Lozenges (vit a-vit c-zinc-propolis)  lozenge; - ; oral Special Instructions: Give 5x/day x 2 weeks. 5 Times Per Day   hydrocortisone 1 % lotion Special Instructions: left upper arm Every Shift - PRN   losartan (COZAAR) 100 MG tablet Take 100 mg by mouth daily.   meloxicam (MOBIC) 15 MG tablet Take 15 mg by mouth daily.   miconazole (ZEASORB-AF) 2 % powder Apply 1 application topically as needed for itching (Every Shift - PRN; PRN 1, PRN 2, PRN 3).   molnupiravir EUA (LAGEVRIO) 200 MG CAPS capsule Take 4 capsules by mouth 2 (two) times daily.   NON FORMULARY Diet: : Dys 3 (chopped) diet, continue thin liquids; will allow pizza if requested   omeprazole (PRILOSEC) 20 MG capsule Take 20 mg by mouth in the morning and at bedtime.    polyethylene glycol (MIRALAX / GLYCOLAX) 17 g packet Take  17 g by mouth daily as needed.   pramipexole (MIRAPEX) 0.5 MG tablet Take 0.5 mg by mouth at bedtime.   QUEtiapine (SEROQUEL) 25 MG tablet Take 25 mg by mouth daily.   traZODone (DESYREL) 100 MG tablet Take 100 mg by mouth at bedtime.   valbenazine (INGREZZA) 40 MG capsule Take 40 mg by mouth at bedtime.   vitamin C (ASCORBIC ACID) 500 MG tablet Take 500 mg by mouth 2 (two) times daily.   No facility-administered encounter medications on file as of 07/15/2021.     SIGNIFICANT DIAGNOSTIC EXAMS  LABS REVIEWED; PREVIOUS   07-27-20: wbc 6.9; hgb 13.8; hct 41.9; mcv 95.2 plt 221; glucose 111; bun 19; creat 0.82; k+ 3.4; na++ 139; ca 8.9; GFR>60; liver normal albumin 3.6; chol 110; ldl 45; trig 107 hdl 44 07-30-20: k+ 3.6  09-13-20: wbc 8.3; hgb 17.2; hct 52.6 mcv 95.8 plt 248; glucose 122; bun 15; creat 0.86; k+ 4.5; na++ 139; ca 9.9; GFR>60  12-13-20: urine culture multiple species 01-17-21: wbc 7.1; hgb 14.8; hct 43.5; mcv 93.8 plt 239 01-21-21: wbc 7.7; hgb 15.9; hct 46.8; mcv 92.3 plt 260; glucose 117; bun 13; creat 0.80; k+ 3.3; na++ 138; ca 9.2; GFR>60 liver normal albumin 4.2  01-24-21: glucose 107; bun 23; creat 1.01; k+ 3.4; na++ 137; ca 8.8; GFR >58  02-14-21: glucose 137; bun 19; creat 0.77; k+ 3.7; na++ 137; ca 8.9; GFR>60 04-11-21: hgb a1c 5.5; tsh 1.431 free t4: 1.12  TODAY  07-12-21; wbc 7.5; hgb 15.5; hct 46.0; mcv 97.5 plt 252; glucose 112; bun 31; creat 0.98; k+ 4.0; na++ 138; ca 9.5; gfr >60; d-dimer 0.33; CRP 0.8    Review of Systems  Reason unable to perform ROS: unable to fully  participate.   Physical Exam Constitutional:      General: She is not in acute distress.    Appearance: She is well-developed. She is not diaphoretic.  Neck:     Thyroid: No thyromegaly.  Cardiovascular:     Rate and Rhythm: Normal rate and regular rhythm.     Pulses: Normal pulses.     Heart sounds: Normal heart sounds.  Pulmonary:     Effort: Pulmonary effort is normal. No respiratory distress.      Breath sounds: Normal breath sounds.  Abdominal:     General: Bowel sounds are normal. There is no distension.     Palpations: Abdomen is soft.     Tenderness: There is no abdominal tenderness.  Musculoskeletal:     Cervical back: Neck supple.     Right lower leg: No edema.     Left lower leg: No edema.     Comments: Has left side weakness Leans head to the left  Bilateral hand tremor left >right  Has bilateral eyebrow movement Right arm shrug    Lymphadenopathy:     Cervical: No cervical adenopathy.  Skin:    General: Skin is warm and dry.  Neurological:     Mental Status: She is alert. Mental status is at baseline.  Psychiatric:        Mood and Affect: Mood normal.      ASSESSMENT/ PLAN:  TODAY  Sars cov-2 positive  Is presently stable will continue current regimen Will continue to monitor her status.      Ok Edwards NP [facility]  call [phone]

## 2021-07-19 ENCOUNTER — Non-Acute Institutional Stay (SKILLED_NURSING_FACILITY): Payer: Medicare Other | Admitting: Adult Health

## 2021-07-19 ENCOUNTER — Encounter: Payer: Self-pay | Admitting: Adult Health

## 2021-07-19 DIAGNOSIS — F02B2 Dementia in other diseases classified elsewhere, moderate, with psychotic disturbance: Secondary | ICD-10-CM | POA: Diagnosis not present

## 2021-07-19 DIAGNOSIS — J449 Chronic obstructive pulmonary disease, unspecified: Secondary | ICD-10-CM | POA: Diagnosis not present

## 2021-07-19 DIAGNOSIS — G20 Parkinson's disease: Secondary | ICD-10-CM | POA: Diagnosis not present

## 2021-07-19 DIAGNOSIS — G20A1 Parkinson's disease without dyskinesia, without mention of fluctuations: Secondary | ICD-10-CM

## 2021-07-19 NOTE — Progress Notes (Signed)
Location:  [place] Room Number: 155-P Place of Service:  SNF (31)   CODE STATUS: Full Code  Allergies  Allergen Reactions   Oxycodone Other (See Comments)    Can tolerate low dose mixed with tynlelol HALLUCINATIONS    Docosahexaenoic Acid Other (See Comments)   Eicosapentaenoic Acid    Eicosapentaenoic Acid (Epa)    Lutein Nausea And Vomiting   Omega-3 Fatty Acids Other (See Comments)   Other    Aspirin Nausea And Vomiting and Other (See Comments)    Severe stomach pain    Codeine Nausea Only    Makes her feel very sick     Chief Complaint  Patient presents with   Acute Visit    Care plan meeting    HPI:  We have come together for her care plan meeting. BIMS 15/15 mood 12/30: depression; decreased appetite; poor energy; trouble concentrating. Is due to have teeth extracted. Requires extensive assist with adls. Is incontinent of bladder and bowel. No recent falls; is nonambulatory. Dietary: over the past year has had 13%weight loss to her current weight of 159.4 pounds; does take ensure with medications. Therapy: none at this time. 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 Parkinson's disease   Past Medical History:  Diagnosis Date   Anxiety    Arthritis    Complication of anesthesia    Depression    Depression with suicidal ideation 01/21/2021   Dysphasia    GERD (gastroesophageal reflux disease)    Hyperlipidemia    Hypertension    Parkinson's disease ([place])    diagnosed at age 32   Parkinson's disease dementia ([place]) 07/31/2020   PONV (postoperative nausea and vomiting)    states it has been a long time since getting sick   Sleep apnea    Has CPAP but doesnt use   Stroke ([place])    TIA (transient ischemic attack)    not really TIA but abnormal MRI per pt left sided weakness   UTI (lower urinary tract infection)    Vitamin  D deficiency     Past Surgical History:  Procedure Laterality Date   BACK SURGERY     Battery change     DBS, 12/2009   BIOPSY N/A 11/29/2013   Procedure: GASTRIC BIOPSY;  Surgeon: Danie Binder, MD;  Location: [facility];  Service: Endoscopy;  Laterality: N/A;   BURR HOLE W/ STEREOTACTIC INSERTION OF DBS LEADS / INTRAOP MICROELECTRODE RECORDING     2007   CARPAL TUNNEL RELEASE Right    COLONOSCOPY WITH PROPOFOL N/A 11/29/2013   Procedure: COLONOSCOPY WITH PROPOFOL;  Surgeon: Danie Binder, MD;  Location: [facility];  Service: Endoscopy;  Laterality: N/A;  entered cecum @ [phone]; total cecal withdrawal time 21 minutes    ELBOW SURGERY Left    after fx   ESOPHAGEAL DILATION N/A 11/29/2013   Procedure: ESOPHAGEAL DILATION;  Surgeon: Danie Binder, MD;  Location: [facility];  Service: Endoscopy;  Laterality: N/A;  [place] 14/15/16   ESOPHAGOGASTRODUODENOSCOPY (EGD) WITH PROPOFOL N/A 11/29/2013   Procedure: ESOPHAGOGASTRODUODENOSCOPY (EGD) WITH PROPOFOL;  Surgeon: Danie Binder, MD;  Location: [facility];  Service: Endoscopy;  Laterality: N/A;   FOOT SURGERY Left    "something with 2nd 2."   NECK SURGERY     Fusion   POLYPECTOMY N/A 11/29/2013   Procedure: POLYPECTOMY;  Surgeon: Danie Binder, MD;  Location: [facility];  Service: Endoscopy;  Laterality: N/A;  Distal Transverse Colon x2    PR ANALYZE NEUROSTIM BRAIN, FIRST 1H  10/15/2012       PULSE GENERATOR IMPLANT Right 03/30/2018   Procedure: Right Implantable pulse generator change;  Surgeon: Erline Levine, MD;  Location: [place];  Service: Neurosurgery;  Laterality: Right;   SUBTHALAMIC STIMULATOR BATTERY REPLACEMENT N/A 08/05/2013   Procedure: SUBTHALAMIC STIMULATOR BATTERY REPLACEMENT;  Surgeon: Erline Levine, MD;  Location: [place] [facility];  Service: Neurosurgery;  Laterality: N/A;   SUBTHALAMIC STIMULATOR BATTERY REPLACEMENT N/A 11/26/2015   Procedure: Implantable pulse generator battery change for deep brain stimulator;  Surgeon: Erline Levine, MD;  Location: [place]  [facility];  Service: Neurosurgery;  Laterality: N/A;   SUBTHALAMIC STIMULATOR BATTERY REPLACEMENT Right 09/13/2020   Procedure: Right chest implantable pulse generator change for depleted battery;  Surgeon: Erline Levine, MD;  Location: [facility];  Service: Neurosurgery;  Laterality: Right;  3C/RM 18   VAGINAL HYSTERECTOMY     "partial"   WRIST SURGERY Right    after fx    Social History   Socioeconomic History   Marital status: Widowed    Spouse name: Not on file   Number of children: Not on file   Years of education: Not on file   Highest education level: Not on file  Occupational History   Occupation: Retired  Tobacco Use   Smoking status: Former    Packs/day: 0.50    Years: 25.00    Pack years: 12.50    Types: Cigarettes    Quit date: 03/01/2019    Years since quitting: 2.3   Smokeless tobacco: Never  Vaping Use   Vaping Use: Never used  Substance and Sexual Activity   Alcohol use: Not Currently   Drug use: No   Sexual activity: Not on file  Other Topics Concern   Not on file  Social History Narrative   She resides at [facility] (assisted living facility).   Social Determinants of Health   Financial Resource Strain: Not on file  Food Insecurity: Not on file  Transportation Needs: Not on file  Physical Activity: Not on file  Stress: Not on file  Social Connections: Not on file  Intimate Partner Violence: Not on file   Family History  Problem Relation Age of Onset   Colon cancer Paternal Uncle       VITAL SIGNS BP (!) 126/52    Pulse (!) 58    Temp (!) 97.3 F (36.3 C)    Resp 20    Ht 5\' 2"  (1.575 m)    Wt 159 lb 6.4 oz (72.3 kg)    SpO2 96%    BMI 29.15 kg/m   Outpatient Encounter Medications as of 07/19/2021  Medication Sig   acetaminophen (TYLENOL) 500 MG tablet Take 500 mg by mouth every 6 (six) hours as needed for mild pain or moderate pain.   albuterol (VENTOLIN HFA) 108 (90 Base) MCG/ACT inhaler Inhale 2 puffs into the lungs every 4 (four) hours as  needed for wheezing or shortness of breath.   ALPRAZolam (XANAX) 0.25 MG tablet Take 1 tablet (0.25 mg total) by mouth 2 (two) times daily. Major depressive disorder, recurrent, unspecified   amLODipine (NORVASC) 10 MG tablet Take 10 mg by mouth daily.    atorvastatin (LIPITOR) 40 MG tablet Take 1 tablet (40 mg total) by mouth daily.   BREZTRI AEROSPHERE 160-9-4.8 MCG/ACT AERO Inhale 2 puffs into the lungs 2 (two) times daily.  Camphor-Menthol-Methyl Sal (SALONPAS) 3.05-31-08 % [facility] Special Instructions: apply one pack to both sides of lower back daily and remove and at HS. for bilateral lower back pain. [DX: Chronic pain syndrome-per NP note]   carbidopa-levodopa (SINEMET IR) 25-100 MG tablet Take 1 tablet by mouth 2 (two) times daily.   Cholecalciferol 50 MCG (2000 UT) CAPS Take 1 capsule by mouth daily.   clopidogrel (PLAVIX) 75 MG tablet Take 75 mg by mouth daily.   Dextromethorphan-quiNIDine (NUEDEXTA) 20-10 MG capsule Take 1 capsule by mouth 2 (two) times daily.   docusate sodium (COLACE) 100 MG capsule Take 100 mg by mouth daily.   donepezil (ARICEPT) 5 MG tablet Take 5 mg by mouth at bedtime. 9 pm for parkinson's dementia   DULoxetine (CYMBALTA) 60 MG capsule Take 40 mg by mouth daily.   Ensure (ENSURE) Take 237 mLs by mouth 2 (two) times daily between meals. 9 am and 9 pm   ergocalciferol (VITAMIN D2) 1.25 MG (50000 UT) capsule Take 50,000 Units by mouth once a week.   guaiFENesin (MUCINEX) 600 MG 12 hr tablet Take 600 mg by mouth daily as needed.   Homeopathic Products (ZINC) LOZG (with A and C) Lozenges (vit a-vit c-zinc-propolis)  lozenge; - ; oral Special Instructions: Give 5x/day x 2 weeks. 5 Times Per Day   hydrocortisone 1 % lotion Special Instructions: left upper arm Every Shift - PRN   losartan (COZAAR) 100 MG tablet Take 100 mg by mouth daily.   meloxicam (MOBIC) 15 MG tablet Take 15 mg by mouth daily.   miconazole (ZEASORB-AF) 2 % powder Apply 1 application topically as  needed for itching (Every Shift - PRN; PRN 1, PRN 2, PRN 3).   NON FORMULARY Diet: : Dys 3 (chopped) diet, continue thin liquids; will allow pizza if requested   omeprazole (PRILOSEC) 20 MG capsule Take 20 mg by mouth in the morning and at bedtime.    polyethylene glycol (MIRALAX / GLYCOLAX) 17 g packet Take 17 g by mouth daily as needed.   pramipexole (MIRAPEX) 0.5 MG tablet Take 0.5 mg by mouth at bedtime.   QUEtiapine (SEROQUEL) 25 MG tablet Take 25 mg by mouth daily.   traZODone (DESYREL) 100 MG tablet Take 100 mg by mouth at bedtime.   valbenazine (INGREZZA) 40 MG capsule Take 40 mg by mouth at bedtime.   vitamin C (ASCORBIC ACID) 500 MG tablet Take 500 mg by mouth 2 (two) times daily.   No facility-administered encounter medications on file as of 07/19/2021.     SIGNIFICANT DIAGNOSTIC EXAMS  LABS REVIEWED; PREVIOUS   07-27-20: wbc 6.9; hgb 13.8; hct 41.9; mcv 95.2 plt 221; glucose 111; bun 19; creat 0.82; k+ 3.4; na++ 139; ca 8.9; GFR>60; liver normal albumin 3.6; chol 110; ldl 45; trig 107 hdl 44 07-30-20: k+ 3.6  09-13-20: wbc 8.3; hgb 17.2; hct 52.6 mcv 95.8 plt 248; glucose 122; bun 15; creat 0.86; k+ 4.5; na++ 139; ca 9.9; GFR>60  12-13-20: urine culture multiple species 01-17-21: wbc 7.1; hgb 14.8; hct 43.5; mcv 93.8 plt 239 01-21-21: wbc 7.7; hgb 15.9; hct 46.8; mcv 92.3 plt 260; glucose 117; bun 13; creat 0.80; k+ 3.3; na++ 138; ca 9.2; GFR>60 liver normal albumin 4.2  01-24-21: glucose 107; bun 23; creat 1.01; k+ 3.4; na++ 137; ca 8.8; GFR >58  02-14-21: glucose 137; bun 19; creat 0.77; k+ 3.7; na++ 137; ca 8.9; GFR>60 04-11-21: hgb a1c 5.5; tsh 1.431 free t4: 1.12 07-12-21; wbc 7.5; hgb 15.5; hct 46.0;  mcv 97.5 plt 252; glucose 112; bun 31; creat 0.98; k+ 4.0; na++ 138; ca 9.5; gfr >60; d-dimer 0.33; CRP 0.8    NO NEW LABS.   Review of Systems  Constitutional:  Negative for malaise/fatigue.  Respiratory:  Negative for cough and shortness of breath.   Cardiovascular:  Negative for  chest pain, palpitations and leg swelling.  Gastrointestinal:  Negative for abdominal pain, constipation and heartburn.  Musculoskeletal:  Negative for back pain, joint pain and myalgias.  Skin: Negative.   Neurological:  Negative for dizziness.  Psychiatric/Behavioral:  The patient is not nervous/anxious.    Physical Exam Constitutional:      General: She is not in acute distress.    Appearance: She is well-developed. She is not diaphoretic.  Neck:     Thyroid: No thyromegaly.  Cardiovascular:     Rate and Rhythm: Normal rate and regular rhythm.     Pulses: Normal pulses.     Heart sounds: Normal heart sounds.  Pulmonary:     Effort: Pulmonary effort is normal. No respiratory distress.     Breath sounds: Normal breath sounds.  Abdominal:     General: Bowel sounds are normal. There is no distension.     Palpations: Abdomen is soft.     Tenderness: There is no abdominal tenderness.  Musculoskeletal:     Cervical back: Neck supple.     Right lower leg: No edema.     Left lower leg: No edema.     Comments:  Has left side weakness Leans head to the left  Bilateral hand tremor left >right  Has bilateral eyebrow movement Right arm shrug  Lymphadenopathy:     Cervical: No cervical adenopathy.  Skin:    General: Skin is warm and dry.  Neurological:     Mental Status: She is alert. Mental status is at baseline.  Psychiatric:        Mood and Affect: Mood normal.      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    Time spent with patient: 40 minutes: plan of care medications    Ok Edwards NP [facility]   call [phone]

## 2021-07-24 ENCOUNTER — Other Ambulatory Visit: Payer: Self-pay | Admitting: Adult Health

## 2021-07-24 MED ORDER — ALPRAZOLAM 0.25 MG PO TABS: 0.2500 mg | ORAL_TABLET | Freq: Two times a day (BID) | ORAL | 0 refills | Status: DC

## 2021-07-25 ENCOUNTER — Encounter: Payer: Self-pay | Admitting: Adult Health

## 2021-07-25 ENCOUNTER — Non-Acute Institutional Stay (SKILLED_NURSING_FACILITY): Payer: Medicare Other | Admitting: Adult Health

## 2021-07-25 DIAGNOSIS — F339 Major depressive disorder, recurrent, unspecified: Secondary | ICD-10-CM

## 2021-07-25 DIAGNOSIS — F32A Depression, unspecified: Secondary | ICD-10-CM

## 2021-07-25 DIAGNOSIS — R45851 Suicidal ideations: Secondary | ICD-10-CM

## 2021-07-25 DIAGNOSIS — K5909 Other constipation: Secondary | ICD-10-CM | POA: Diagnosis not present

## 2021-07-25 DIAGNOSIS — J449 Chronic obstructive pulmonary disease, unspecified: Secondary | ICD-10-CM

## 2021-07-25 DIAGNOSIS — G20A1 Parkinson's disease without dyskinesia, without mention of fluctuations: Secondary | ICD-10-CM

## 2021-07-25 DIAGNOSIS — I10 Essential (primary) hypertension: Secondary | ICD-10-CM | POA: Diagnosis not present

## 2021-07-25 DIAGNOSIS — G2401 Drug induced subacute dyskinesia: Secondary | ICD-10-CM

## 2021-07-25 DIAGNOSIS — E782 Mixed hyperlipidemia: Secondary | ICD-10-CM

## 2021-07-25 DIAGNOSIS — G20 Parkinson's disease: Secondary | ICD-10-CM | POA: Diagnosis not present

## 2021-07-25 DIAGNOSIS — Z9689 Presence of other specified functional implants: Secondary | ICD-10-CM

## 2021-07-25 DIAGNOSIS — F02B2 Dementia in other diseases classified elsewhere, moderate, with psychotic disturbance: Secondary | ICD-10-CM

## 2021-07-25 DIAGNOSIS — F323 Major depressive disorder, single episode, severe with psychotic features: Secondary | ICD-10-CM

## 2021-07-25 DIAGNOSIS — N3281 Overactive bladder: Secondary | ICD-10-CM

## 2021-07-25 DIAGNOSIS — G894 Chronic pain syndrome: Secondary | ICD-10-CM

## 2021-07-25 NOTE — Progress Notes (Signed)
Location:  [place] Room Number: 108-W Place of Service:  SNF (31)   CODE STATUS: Full Code  Allergies  Allergen Reactions   Oxycodone Other (See Comments)    Can tolerate low dose mixed with tynlelol HALLUCINATIONS    Docosahexaenoic Acid Other (See Comments)   Eicosapentaenoic Acid    Eicosapentaenoic Acid (Epa)    Lutein Nausea And Vomiting   Omega-3 Fatty Acids Other (See Comments)   Other    Aspirin Nausea And Vomiting and Other (See Comments)    Severe stomach pain    Codeine Nausea Only    Makes her feel very sick     Chief Complaint  Patient presents with   Annual Exam    HPI:  She is a 76 year old long term resident of this facility being seen for her annual exam. She has not been hospitalized. Has had one visit to the ED 01-21-21: for suicidal ideation; and had her battery to the deep brain stimulator on 09-13-20.  She is a ward of the state. The paper work has been completed for her DNR. There are no reports of anxiety or depressive thoughts; no reports of changes in appetite. She continues to be followed for her chronic illnesses including:   Chronic pain syndrome:  Parkinson's disease related dementia: Essential hypertension: Parkinson's disease  Past Medical History:  Diagnosis Date   Anxiety    Arthritis    Complication of anesthesia    Depression    Depression with suicidal ideation 01/21/2021   Dysphasia    GERD (gastroesophageal reflux disease)    Hyperlipidemia    Hypertension    Parkinson's disease ([place])    diagnosed at age 89   Parkinson's disease dementia ([place]) 07/31/2020   PONV (postoperative nausea and vomiting)    states it has been a long time since getting sick   Sleep apnea    Has CPAP but doesnt use   Stroke ([place])    TIA (transient ischemic attack)    not really TIA but abnormal MRI per pt left sided weakness   UTI (lower urinary tract infection)    Vitamin D deficiency     Past Surgical History:  Procedure  Laterality Date   BACK SURGERY     Battery change     DBS, 12/2009   BIOPSY N/A 11/29/2013   Procedure: GASTRIC BIOPSY;  Surgeon: Danie Binder, MD;  [facility]: [facility];  Service: Endoscopy;  Laterality: N/A;   BURR HOLE W/ STEREOTACTIC INSERTION OF DBS LEADS / INTRAOP MICROELECTRODE RECORDING     2007   CARPAL TUNNEL RELEASE Right    COLONOSCOPY WITH PROPOFOL N/A 11/29/2013   Procedure: COLONOSCOPY WITH PROPOFOL;  Surgeon: Danie Binder, MD;  Location: [facility];  Service: Endoscopy;  Laterality: N/A;  entered cecum @ [phone]; total cecal withdrawal time 21 minutes    ELBOW SURGERY Left    after fx   ESOPHAGEAL DILATION N/A 11/29/2013   Procedure: ESOPHAGEAL DILATION;  Surgeon: Danie Binder, MD;  Location: [facility];  Service: Endoscopy;  Laterality: N/A;  [facility] 14/15/16   ESOPHAGOGASTRODUODENOSCOPY (EGD) WITH PROPOFOL N/A 11/29/2013   Procedure: ESOPHAGOGASTRODUODENOSCOPY (EGD) WITH PROPOFOL;  Surgeon: Danie Binder, MD;  [facility];  Service: Endoscopy;  Laterality: N/A;   FOOT SURGERY Left    "something with 2nd 2."   NECK SURGERY     Fusion   POLYPECTOMY N/A 11/29/2013   Procedure: POLYPECTOMY;  Surgeon: Danie Binder, MD;  Location: [facility];  Service: Endoscopy;  Laterality: N/A;  Distal Transverse Colon x2    PR ANALYZE NEUROSTIM BRAIN, FIRST 1H  10/15/2012       PULSE GENERATOR IMPLANT Right 03/30/2018   Procedure: Right Implantable pulse generator change;  Surgeon: Erline Levine, MD;  Location: [facility];  Service: Neurosurgery;  Laterality: Right;   SUBTHALAMIC STIMULATOR BATTERY REPLACEMENT N/A 08/05/2013   Procedure: SUBTHALAMIC STIMULATOR BATTERY REPLACEMENT;  Surgeon: Erline Levine, MD;  Location: [place] [facility];  Service: Neurosurgery;  Laterality: N/A;   SUBTHALAMIC STIMULATOR BATTERY REPLACEMENT N/A 11/26/2015   Procedure: Implantable pulse generator battery change for deep brain stimulator;  Surgeon: Erline Levine, MD;  Location: [place] [facility];  Service: Neurosurgery;  Laterality: N/A;    SUBTHALAMIC STIMULATOR BATTERY REPLACEMENT Right 09/13/2020   Procedure: Right chest implantable pulse generator change for depleted battery;  Surgeon: Erline Levine, MD;  Location: [facility];  Service: Neurosurgery;  Laterality: Right;  3C/RM 18   VAGINAL HYSTERECTOMY     "partial"   WRIST SURGERY Right    after fx    Social History   Socioeconomic History   Marital status: Widowed    Spouse name: Not on file   Number of children: Not on file   Years of education: Not on file   Highest education level: Not on file  Occupational History   Occupation: Retired  Tobacco Use   Smoking status: Former    Packs/day: 0.50    Years: 25.00    Pack years: 12.50    Types: Cigarettes    Quit date: 03/01/2019    Years since quitting: 2.4   Smokeless tobacco: Never  Vaping Use   Vaping Use: Never used  Substance and Sexual Activity   Alcohol use: Not Currently   Drug use: No   Sexual activity: Not on file  Other Topics Concern   Not on file  Social History Narrative   She resides at [facility] (assisted living facility).   Social Determinants of Health   Financial Resource Strain: Not on file  Food Insecurity: Not on file  Transportation Needs: Not on file  Physical Activity: Not on file  Stress: Not on file  Social Connections: Not on file  Intimate Partner Violence: Not on file   Family History  Problem Relation Age of Onset   Colon cancer Paternal Uncle       VITAL SIGNS BP 97/60    Pulse 64    Temp 98.2 F (36.8 C)    Resp 18    Ht 5\' 2"  (1.575 m)    Wt 159 lb 6.4 oz (72.3 kg)    SpO2 97%    BMI 29.15 kg/m   Outpatient Encounter Medications as of 07/25/2021  Medication Sig   acetaminophen (TYLENOL) 500 MG tablet Take 500 mg by mouth every 6 (six) hours as needed for mild pain or moderate pain.   albuterol (VENTOLIN HFA) 108 (90 Base) MCG/ACT inhaler Inhale 2 puffs into the lungs every 4 (four) hours as needed for wheezing or shortness of breath.   ALPRAZolam (XANAX) 0.25  MG tablet Take 1 tablet (0.25 mg total) by mouth 2 (two) times daily. Major depressive disorder, recurrent, unspecified   amLODipine (NORVASC) 10 MG tablet Take 10 mg by mouth daily.    atorvastatin (LIPITOR) 40 MG tablet Take 1 tablet (40 mg total) by mouth daily.   BREZTRI AEROSPHERE 160-9-4.8 MCG/ACT AERO Inhale 2 puffs into the lungs 2 (two) times daily.   Camphor-Menthol-Methyl  Sal (SALONPAS) 3.05-31-08 % [facility] Special Instructions: apply one pack to both sides of lower back daily and remove and at HS. for bilateral lower back pain. [DX: Chronic pain syndrome-per NP note]   carbidopa-levodopa (SINEMET IR) 25-100 MG tablet Take 1 tablet by mouth 2 (two) times daily.   Cholecalciferol 50 MCG (2000 UT) CAPS Take 1 capsule by mouth daily.   clopidogrel (PLAVIX) 75 MG tablet Take 75 mg by mouth daily.   Dextromethorphan-quiNIDine (NUEDEXTA) 20-10 MG capsule Take 1 capsule by mouth 2 (two) times daily.   docusate sodium (COLACE) 100 MG capsule Take 100 mg by mouth daily.   donepezil (ARICEPT) 5 MG tablet Take 5 mg by mouth at bedtime. 9 pm for parkinson's dementia   DULoxetine (CYMBALTA) 60 MG capsule Take 40 mg by mouth daily.   Ensure (ENSURE) Take 237 mLs by mouth 2 (two) times daily between meals. 9 am and 9 pm   ergocalciferol (VITAMIN D2) 1.25 MG (50000 UT) capsule Take 50,000 Units by mouth once a week.   guaiFENesin (MUCINEX) 600 MG 12 hr tablet Take 600 mg by mouth daily as needed.   hydrocortisone 1 % lotion Special Instructions: left upper arm Every Shift - PRN   losartan (COZAAR) 100 MG tablet Take 100 mg by mouth daily.   meloxicam (MOBIC) 15 MG tablet Take 15 mg by mouth daily.   miconazole (ZEASORB-AF) 2 % powder Apply 1 application topically as needed for itching (Every Shift - PRN; PRN 1, PRN 2, PRN 3).   NON FORMULARY Diet: : Dys 3 (chopped) diet, continue thin liquids; will allow pizza if requested  Liquids:Regular   omeprazole (PRILOSEC) 20 MG capsule Take 20 mg by mouth in the  morning and at bedtime.    polyethylene glycol (MIRALAX / GLYCOLAX) 17 g packet Take 17 g by mouth daily as needed.   pramipexole (MIRAPEX) 0.5 MG tablet Take 0.5 mg by mouth at bedtime.   QUEtiapine (SEROQUEL) 25 MG tablet Take 25 mg by mouth daily.   traZODone (DESYREL) 100 MG tablet Take 100 mg by mouth at bedtime.   valbenazine (INGREZZA) 40 MG capsule Take 40 mg by mouth at bedtime.   vitamin C (ASCORBIC ACID) 500 MG tablet Take 500 mg by mouth 2 (two) times daily.   Homeopathic Products (ZINC) LOZG (with A and C) Lozenges (vit a-vit c-zinc-propolis)  lozenge; - ; oral Special Instructions: Give 5x/day x 2 weeks. 5 Times Per Day   No facility-administered encounter medications on file as of 07/25/2021.     SIGNIFICANT DIAGNOSTIC EXAMS  LABS REVIEWED; PREVIOUS   07-27-20: wbc 6.9; hgb 13.8; hct 41.9; mcv 95.2 plt 221; glucose 111; bun 19; creat 0.82; k+ 3.4; na++ 139; ca 8.9; GFR>60; liver normal albumin 3.6; chol 110; ldl 45; trig 107 hdl 44 07-30-20: k+ 3.6  09-13-20: wbc 8.3; hgb 17.2; hct 52.6 mcv 95.8 plt 248; glucose 122; bun 15; creat 0.86; k+ 4.5; na++ 139; ca 9.9; GFR>60  12-13-20: urine culture multiple species 01-17-21: wbc 7.1; hgb 14.8; hct 43.5; mcv 93.8 plt 239 01-21-21: wbc 7.7; hgb 15.9; hct 46.8; mcv 92.3 plt 260; glucose 117; bun 13; creat 0.80; k+ 3.3; na++ 138; ca 9.2; GFR>60 liver normal albumin 4.2  01-24-21: glucose 107; bun 23; creat 1.01; k+ 3.4; na++ 137; ca 8.8; GFR >58  02-14-21: glucose 137; bun 19; creat 0.77; k+ 3.7; na++ 137; ca 8.9; GFR>60 04-11-21: hgb a1c 5.5; tsh 1.431 free t4: 1.12  TODAY  05-30-21: glucose 106;  bun 26; creat 0.95; k+ 4.0; na++ 138; ca 9.6; GFR>60 07-12-21: wbc 7.5; hgb 15.5; hct 46.0; mcv 97.5 plt 252; glucose 112; bun 31; creat 0.98; k+ 4.0; na++ 138; ca 9.5 GFR>60    Review of Systems  Constitutional:  Negative for malaise/fatigue.  Respiratory:  Negative for cough and shortness of breath.   Cardiovascular:  Negative for chest pain,  palpitations and leg swelling.  Gastrointestinal:  Negative for abdominal pain, constipation and heartburn.  Musculoskeletal:  Negative for back pain, joint pain and myalgias.  Skin: Negative.   Neurological:  Negative for dizziness.  Psychiatric/Behavioral:  The patient is not nervous/anxious.     Physical Exam Constitutional:      General: She is not in acute distress.    Appearance: She is well-developed. She is not diaphoretic.  HENT:     Right Ear: External ear normal.     Left Ear: External ear normal.     Nose: Nose normal.     Mouth/Throat:     Mouth: Mucous membranes are moist.     Pharynx: Oropharynx is clear.  Eyes:     Conjunctiva/sclera: Conjunctivae normal.  Neck:     Thyroid: No thyromegaly.  Cardiovascular:     Rate and Rhythm: Normal rate and regular rhythm.     Heart sounds: Normal heart sounds.  Pulmonary:     Effort: Pulmonary effort is normal. No respiratory distress.     Breath sounds: Normal breath sounds.  Abdominal:     General: Bowel sounds are normal. There is no distension.     Palpations: Abdomen is soft.     Tenderness: There is no abdominal tenderness.  Musculoskeletal:     Cervical back: Neck supple.     Right lower leg: No edema.     Left lower leg: No edema.     Comments: Has left side weakness Leans head to the left  Bilateral hand tremor left >right  Has bilateral eyebrow movement Right arm shrug   Lymphadenopathy:     Cervical: No cervical adenopathy.  Skin:    General: Skin is warm and dry.  Neurological:     Mental Status: She is alert. Mental status is at baseline.  Psychiatric:        Mood and Affect: Mood normal.      ASSESSMENT/ PLAN:  TODAY  Chronic pain syndrome: is stable will continue cymbalta 60 mg daily (also takes for mood state); salon patches 2 to her back  2. Parkinson's disease related dementia: is without change weight is 159 pounds will continue aricept 5 mg nightly   3. Essential hypertension: b/p  97/60 will lower her norvasc to 5 mg daily and will continue cozaar 100 mg daily   4. Parkinson's disease is stable is status post deep brain stimulator: will continue sinemet IR 25/100 mg twice daily mirapex 0.5 mg nightly   5. 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 Mixed hyperlipidemia: is stable LDL 44 will continue lipitor 40 mg daily   7. PBA is stable will continue nuedexta 10/20 mg twice daily   8. Tardive dyskinesia: is stable will continue ingrezza 40 mg daily   9. 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 COPD with exacerbation: is stable will continue breztri aerosphere 160-4.8  mcg 2 puffs twice daily has 2 puffs every 4 hours as needed   11. GERD without esophagitis: is stable will continue prilosec 20 mg daily   12. Chronic constipation: is stable will continue colace 100 mg daily and miralax daily as needed     Ok Edwards NP [facility]   call [phone]

## 2021-08-19 ENCOUNTER — Other Ambulatory Visit (HOSPITAL_COMMUNITY): Payer: Self-pay | Admitting: Specialist

## 2021-08-19 DIAGNOSIS — K219 Gastro-esophageal reflux disease without esophagitis: Secondary | ICD-10-CM

## 2021-08-19 DIAGNOSIS — R1312 Dysphagia, oropharyngeal phase: Secondary | ICD-10-CM

## 2021-08-22 ENCOUNTER — Other Ambulatory Visit: Payer: Self-pay | Admitting: Adult Health

## 2021-08-22 MED ORDER — ALPRAZOLAM 0.25 MG PO TABS: 0.2500 mg | ORAL_TABLET | Freq: Two times a day (BID) | ORAL | 0 refills | Status: DC

## 2021-08-26 ENCOUNTER — Encounter: Payer: Self-pay | Admitting: Adult Health

## 2021-08-26 ENCOUNTER — Non-Acute Institutional Stay (SKILLED_NURSING_FACILITY): Payer: Medicare Other | Admitting: Adult Health

## 2021-08-26 DIAGNOSIS — G20 Parkinson's disease: Secondary | ICD-10-CM

## 2021-08-26 DIAGNOSIS — G894 Chronic pain syndrome: Secondary | ICD-10-CM | POA: Diagnosis not present

## 2021-08-26 DIAGNOSIS — I10 Essential (primary) hypertension: Secondary | ICD-10-CM | POA: Diagnosis not present

## 2021-08-26 DIAGNOSIS — F02B2 Dementia in other diseases classified elsewhere, moderate, with psychotic disturbance: Secondary | ICD-10-CM

## 2021-08-26 NOTE — Progress Notes (Signed)
?Location:  [place] [facility] Room Number: 108-W ?Place of Service:  SNF (31) ? ? ?CODE STATUS: DNR ? ?Allergies  ?Allergen Reactions  ? Oxycodone Other (See Comments)  ?  Can tolerate low dose mixed with tynlelol ?HALLUCINATIONS   ? Docosahexaenoic Acid Other (See Comments)  ? Eicosapentaenoic Acid   ? Eicosapentaenoic Acid (Epa)   ? Lutein Nausea And Vomiting  ? Omega-3 Fatty Acids Other (See Comments)  ? Other   ? Aspirin Nausea And Vomiting and Other (See Comments)  ?  Severe stomach pain   ? Codeine Nausea Only  ?  Makes her feel very sick   ? ? ?Chief Complaint  ?Patient presents with  ? Medical Management of Chronic Issues ? ?              Chronic pain syndrome:  Parkinson's disease related dementia:  Essential hypertension: Parkinson's disease   ? ? ?HPI: ? ?She is a 76 year old long term resident of this facility being seen for the management of her chronic illnesses:  Chronic pain syndrome:  Parkinson's disease related dementia:  Essential hypertension: Parkinson's disease is stable. There are no reports of uncontrolled pain; no reports of anxiety; depressive thoughts or agitation.   ? ?Past Medical History:  ?Diagnosis Date  ? Anxiety   ? Arthritis   ? Complication of anesthesia   ? Depression   ? Depression with suicidal ideation 01/21/2021  ? Dysphasia   ? GERD (gastroesophageal reflux disease)   ? Hyperlipidemia   ? Hypertension   ? Parkinson's disease ([place])   ? diagnosed at age 62  ? Parkinson's disease dementia ([place]) 07/31/2020  ? PONV (postoperative nausea and vomiting)   ? states it has been a long time since getting sick  ? Sleep apnea   ? Has CPAP but doesnt use  ? Stroke [facility])   ? TIA (transient ischemic attack)   ? not really TIA but abnormal MRI per pt left sided weakness  ? UTI (lower urinary tract infection)   ? Vitamin D deficiency   ? ? ?Past Surgical History:  ?Procedure Laterality Date  ? BACK SURGERY    ? Battery change    ? DBS, 12/2009  ? BIOPSY N/A 11/29/2013  ? Procedure:  GASTRIC BIOPSY;  Surgeon: Danie Binder, MD;  Location: [facility];  Service: Endoscopy;  Laterality: N/A;  ? BURR HOLE W/ STEREOTACTIC INSERTION OF DBS LEADS / INTRAOP MICROELECTRODE RECORDING    ? 2007  ? CARPAL TUNNEL RELEASE Right   ? COLONOSCOPY WITH PROPOFOL N/A 11/29/2013  ? Procedure: COLONOSCOPY WITH PROPOFOL;  Surgeon: Danie Binder, MD;  Location: [facility];  Service: Endoscopy;  Laterality: N/A;  entered cecum @ [phone]; total cecal withdrawal time 21 minutes   ? ELBOW SURGERY Left   ? after fx  ? ESOPHAGEAL DILATION N/A 11/29/2013  ? Procedure: ESOPHAGEAL DILATION;  Surgeon: Danie Binder, MD;  [facility];  Service: Endoscopy;  Laterality: N/A;  [place] 14/15/16  ? ESOPHAGOGASTRODUODENOSCOPY (EGD) WITH PROPOFOL N/A 11/29/2013  ? Procedure: ESOPHAGOGASTRODUODENOSCOPY (EGD) WITH PROPOFOL;  Surgeon: Danie Binder, MD;  [facility];  Service: Endoscopy;  Laterality: N/A;  ? FOOT SURGERY Left   ? "something with 2nd 2."  ? NECK SURGERY    ? Fusion  ? POLYPECTOMY N/A 11/29/2013  ? Procedure: POLYPECTOMY;  Surgeon: Danie Binder, MD;  [facility];  Service: Endoscopy;  Laterality: N/A;  Distal Transverse Colon x2 ?  ?  PR ANALYZE NEUROSTIM BRAIN, FIRST 1H  10/15/2012  ?    ? PULSE GENERATOR IMPLANT Right 03/30/2018  ? Procedure: Right Implantable pulse generator change;  Surgeon: Erline Levine, MD;  Location: [place];  Service: Neurosurgery;  Laterality: Right;  ? SUBTHALAMIC STIMULATOR BATTERY REPLACEMENT N/A 08/05/2013  ? Procedure: SUBTHALAMIC STIMULATOR BATTERY REPLACEMENT;  Surgeon: Erline Levine, MD;  Location: [place] [facility];  Service: Neurosurgery;  Laterality: N/A;  ? SUBTHALAMIC STIMULATOR BATTERY REPLACEMENT N/A 11/26/2015  ? Procedure: Implantable pulse generator battery change for deep brain stimulator;  Surgeon: Erline Levine, MD;  Location: [place] [facility];  Service: Neurosurgery;  Laterality: N/A;  ? SUBTHALAMIC STIMULATOR BATTERY REPLACEMENT Right 09/13/2020  ? Procedure: Right chest implantable pulse  generator change for depleted battery;  Surgeon: Erline Levine, MD;  Location: [place];  Service: Neurosurgery;  Laterality: Right;  3C/RM 18  ? VAGINAL HYSTERECTOMY    ? "partial"  ? WRIST SURGERY Right   ? after fx  ? ? ?Social History  ? ?Socioeconomic History  ? Marital status: Widowed  ?  Spouse name: Not on file  ? Number of children: Not on file  ? Years of education: Not on file  ? Highest education level: Not on file  ?Occupational History  ? Occupation: Retired  ?Tobacco Use  ? Smoking status: Former  ?  Packs/day: 0.50  ?  Years: 25.00  ?  Pack years: 12.50  ?  Types: Cigarettes  ?  Quit date: 03/01/2019  ?  Years since quitting: 2.4  ? Smokeless tobacco: Never  ?Vaping Use  ? Vaping Use: Never used  ?Substance and Sexual Activity  ? Alcohol use: Not Currently  ? Drug use: No  ? Sexual activity: Not on file  ?Other Topics Concern  ? Not on file  ?Social History Narrative  ? She resides at [facility] (assisted living facility).  ? ?Social Determinants of Health  ? ?Financial Resource Strain: Not on file  ?Food Insecurity: Not on file  ?Transportation Needs: Not on file  ?Physical Activity: Not on file  ?Stress: Not on file  ?Social Connections: Not on file  ?Intimate Partner Violence: Not on file  ? ?Family History  ?Problem Relation Age of Onset  ? Colon cancer Paternal Uncle   ? ? ? ? ?VITAL SIGNS ?BP (!) 138/54   Pulse 78   Temp 98.3 ?F (36.8 ?C)   Ht '5\' 2"'$  (1.575 m)   Wt 156 lb 3.2 oz (70.9 kg)   SpO2 98%   BMI 28.57 kg/m?  ? ?Outpatient Encounter Medications as of 08/26/2021  ?Medication Sig  ? acetaminophen (TYLENOL) 500 MG tablet Take 500 mg by mouth every 6 (six) hours as needed for mild pain or moderate pain.  ? albuterol (VENTOLIN HFA) 108 (90 Base) MCG/ACT inhaler Inhale 2 puffs into the lungs every 4 (four) hours as needed for wheezing or shortness of breath.  ? ALPRAZolam (XANAX) 0.25 MG tablet Take 1 tablet (0.25 mg total) by mouth 2 (two) times daily. Major depressive disorder, recurrent,  unspecified  ? amLODipine (NORVASC) 5 MG tablet Take 5 mg by mouth daily.  ? atorvastatin (LIPITOR) 40 MG tablet Take 1 tablet (40 mg total) by mouth daily.  ? BREZTRI AEROSPHERE 160-9-4.8 MCG/ACT AERO Inhale 2 puffs into the lungs 2 (two) times daily.  ? Camphor-Menthol-Methyl Sal (SALONPAS) 3.05-31-08 % [facility] Special Instructions: apply one pack to both sides of lower back daily and remove and at HS. for bilateral lower back pain. ?[DX:  Chronic pain syndrome-per NP note]  ? carbidopa-levodopa (SINEMET IR) 25-100 MG tablet Take 1 tablet by mouth 2 (two) times daily.  ? Cholecalciferol 50 MCG (2000 UT) CAPS Take 1 capsule by mouth daily.  ? clopidogrel (PLAVIX) 75 MG tablet Take 75 mg by mouth daily.  ? Dextromethorphan-quiNIDine (NUEDEXTA) 20-10 MG capsule Take 1 capsule by mouth 2 (two) times daily.  ? docusate sodium (COLACE) 100 MG capsule Take 100 mg by mouth daily.  ? donepezil (ARICEPT) 5 MG tablet Take 5 mg by mouth at bedtime. 9 pm for parkinson's dementia  ? DULoxetine (CYMBALTA) 60 MG capsule Take 40 mg by mouth daily.  ? Ensure (ENSURE) Take 237 mLs by mouth 2 (two) times daily between meals. 9 am and 9 pm  ? guaiFENesin (MUCINEX) 600 MG 12 hr tablet Take 600 mg by mouth daily as needed.  ? hydrocortisone 1 % lotion Special Instructions: left upper arm ?Every Shift - PRN  ? losartan (COZAAR) 100 MG tablet Take 100 mg by mouth daily.  ? meloxicam (MOBIC) 15 MG tablet Take 15 mg by mouth daily.  ? miconazole (ZEASORB-AF) 2 % powder Apply 1 application topically as needed for itching (Every Shift - PRN; PRN 1, PRN 2, PRN 3).  ? NON FORMULARY Diet: : Dys 3 (chopped) diet, continue thin liquids; will allow pizza if requested ? Liquids:Regular  ? omeprazole (PRILOSEC) 20 MG capsule Take 20 mg by mouth in the morning and at bedtime.   ? polyethylene glycol (MIRALAX / GLYCOLAX) 17 g packet Take 17 g by mouth daily as needed.  ? pramipexole (MIRAPEX) 0.5 MG tablet Take 0.5 mg by mouth at bedtime.  ? QUEtiapine  (SEROQUEL) 25 MG tablet Take 25 mg by mouth daily.  ? traZODone (DESYREL) 100 MG tablet Take 100 mg by mouth at bedtime.  ? [DISCONTINUED] amLODipine (NORVASC) 10 MG tablet Take 10 mg by mouth daily.   ? [DISCONTINUE

## 2021-09-02 ENCOUNTER — Encounter (HOSPITAL_COMMUNITY): Payer: Self-pay | Admitting: Speech Pathology

## 2021-09-02 ENCOUNTER — Ambulatory Visit (HOSPITAL_COMMUNITY): Payer: Medicare Other | Attending: Internal Medicine | Admitting: Speech Pathology

## 2021-09-02 ENCOUNTER — Inpatient Hospital Stay (HOSPITAL_COMMUNITY)
Admit: 2021-09-02 | Discharge: 2021-09-02 | Disposition: A | Discharge status: Home / Self Care | Payer: Medicare Other | Attending: Internal Medicine | Admitting: Internal Medicine

## 2021-09-02 DIAGNOSIS — K219 Gastro-esophageal reflux disease without esophagitis: Secondary | ICD-10-CM | POA: Insufficient documentation

## 2021-09-02 DIAGNOSIS — R1312 Dysphagia, oropharyngeal phase: Secondary | ICD-10-CM | POA: Insufficient documentation

## 2021-09-02 NOTE — Therapy (Signed)
[place] ?[place] ?[address] ?[place], [place], [address] ?Phone: [phone]   Fax:  [phone] ? ?Modified Barium Swallow ? ?Patient Details  ?Name: Courtney Tucker ?MRN: [id number] ?Date of Birth: February 06, 1946 ?No data recorded ? ?Encounter Date: 09/02/2021 ? ? End of Session - 09/02/21 1337   ? ? Visit Number 1   ? Number of Visits 1   ? Authorization Type Medicare   ? SLP Start Time 1143   ? SLP Stop Time  5009   ? SLP Time Calculation (min) 33 min   ? Activity Tolerance Patient tolerated treatment well   ? ?  ?  ? ?  ? ? ?Past Medical History:  ?Diagnosis Date  ? Anxiety   ? Arthritis   ? Complication of anesthesia   ? Depression   ? Depression with suicidal ideation 01/21/2021  ? Dysphasia   ? GERD (gastroesophageal reflux disease)   ? Hyperlipidemia   ? Hypertension   ? Parkinson's disease ([place])   ? diagnosed at age 36  ? 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 PONV (postoperative nausea and vomiting)   ? states it has been a long time since getting sick  ? Sleep apnea   ? Has CPAP but doesnt use  ? Stroke [facility])   ? TIA (transient ischemic attack)   ? not really TIA but abnormal MRI per pt left sided weakness  ? UTI (lower urinary tract infection)   ? Vitamin D deficiency   ? ? ?Past Surgical History:  ?Procedure Laterality Date  ? BACK SURGERY    ? Battery change    ? DBS, 12/2009  ? BIOPSY N/A 11/29/2013  ? Procedure: GASTRIC BIOPSY;  Surgeon: Danie Binder, MD;  [facility]: [facility];  Service: Endoscopy;  Laterality: N/A;  ? BURR HOLE W/ STEREOTACTIC INSERTION OF DBS LEADS / INTRAOP MICROELECTRODE RECORDING    ? 2007  ? CARPAL TUNNEL RELEASE Right   ? COLONOSCOPY WITH PROPOFOL N/A 11/29/2013  ? Procedure: COLONOSCOPY WITH PROPOFOL;  Surgeon: Danie Binder, MD;  [facility];  Service: Endoscopy;  Laterality: N/A;  entered cecum @ [phone]; total cecal withdrawal time 21 minutes   ? ELBOW SURGERY Left   ? after fx  ? ESOPHAGEAL DILATION N/A 11/29/2013  ? Procedure:  ESOPHAGEAL DILATION;  Surgeon: Danie Binder, MD;  [facility];  Service: Endoscopy;  Laterality: N/A;  [place] 14/15/16  ? ESOPHAGOGASTRODUODENOSCOPY (EGD) WITH PROPOFOL N/A 11/29/2013  ? Procedure: ESOPHAGOGASTRODUODENOSCOPY (EGD) WITH PROPOFOL;  Surgeon: Danie Binder, MD;  [facility];  Service: Endoscopy;  Laterality: N/A;  ? FOOT SURGERY Left   ? "something with 2nd 2."  ? NECK SURGERY    ? Fusion  ? POLYPECTOMY N/A 11/29/2013  ? Procedure: POLYPECTOMY;  Surgeon: Danie Binder, MD;  [facility];  Service: Endoscopy;  Laterality: N/A;  Distal Transverse Colon x2 ?  ? PR ANALYZE NEUROSTIM BRAIN, FIRST 1H  10/15/2012  ?    ? PULSE GENERATOR IMPLANT Right 03/30/2018  ? Procedure: Right Implantable pulse generator change;  Surgeon: Erline Levine, MD;  Location: [place];  Service: Neurosurgery;  Laterality: Right;  ? SUBTHALAMIC STIMULATOR BATTERY REPLACEMENT N/A 08/05/2013  ? Procedure: SUBTHALAMIC STIMULATOR BATTERY REPLACEMENT;  Surgeon: Erline Levine, MD;  Location: [place] [facility];  Service: Neurosurgery;  Laterality: N/A;  ? SUBTHALAMIC STIMULATOR BATTERY REPLACEMENT N/A 11/26/2015  ? Procedure: Implantable pulse generator battery change for deep brain stimulator;  Surgeon: Broadus John  Vertell Limber, MD;  Location: [place] [facility];  Service: Neurosurgery;  Laterality: N/A;  ? SUBTHALAMIC STIMULATOR BATTERY REPLACEMENT Right 09/13/2020  ? Procedure: Right chest implantable pulse generator change for depleted battery;  Surgeon: Erline Levine, MD;  Location: [place];  Service: Neurosurgery;  Laterality: Right;  3C/RM 18  ? VAGINAL HYSTERECTOMY    ? "partial"  ? WRIST SURGERY Right   ? after fx  ? ? ?There were no vitals filed for this visit. ? ? ? ? ? ? ? General - 09/02/21 1315   ? ?  ? General Information  ? Date of Onset 08/19/21   ? HPI Courtney Tucker is a 76 yo female who was referred by Dr. Unice Cobble for MBSS. She has PD with DBS and medication and is a resident at [facility]. She has had MBSS in the past  with the last being about a year ago with recommendation for D3 and NTL and [place] water protocol. Pt is currently on D3/mech soft with thin liquids.   ? Type of Study MBS-Modified Barium Swallow Study   ? Diet Prior to this Study Dysphagia 3 (soft);Thin liquids   ? Temperature Spikes Noted No   ? Respiratory Status Room air   ? History of Recent Intubation No   ? Behavior/Cognition Alert;Cooperative;Pleasant mood   ? Oral Cavity Assessment Within Functional Limits   ? Oral Care Completed by SLP No   ? Oral Cavity - Dentition Missing dentition   ? Self-Feeding Abilities Needs assist   ? Patient Positioning Upright in chair   ? Baseline Vocal Quality Normal;Low vocal intensity   ? Volitional Cough Strong   ? Volitional Swallow Able to elicit   ? Anatomy Within functional limits   ? Pharyngeal Secretions Not observed secondary MBS   ? ?  ?  ? ?  ? ? ? ? ? Oral Preparation/Oral Phase - 09/02/21 1329   ? ?  ? Oral Preparation/Oral Phase  ? Oral Phase Impaired   ?  ? Oral - Thin  ? Oral - Thin Teaspoon Piecemeal swallowing   ? Oral - Thin Cup Piecemeal swallowing   ? Oral - Thin Straw Piecemeal swallowing   ?  ? Oral - Solids  ? Oral - Puree Within functional limits   ? Oral - Mechanical Soft Imparied mastication;Piecemeal swallowing;Delayed A-P transit   ? Oral - Regular Imparied mastication;Piecemeal swallowing;Delayed A-P transit   ? Oral - Pill Within functional limits   ?  ? Electrical stimulation - Oral Phase  ? Was Electrical Stimulation Used No   ? ?  ?  ? ?  ? ? ? Pharyngeal Phase - 09/02/21 1331   ? ?  ? Pharyngeal Phase  ? Pharyngeal Phase Impaired   ?  ? Pharyngeal - Nectar  ? Pharyngeal- Nectar Straw Swallow initiation at vallecula;Within functional limits   ?  ? Pharyngeal - Thin  ? Pharyngeal- Thin Teaspoon Swallow initiation at vallecula;Penetration/Aspiration during swallow   ? Pharyngeal Material does not enter airway;Material enters airway, remains ABOVE vocal cords then ejected out   ? Pharyngeal-  Thin Cup Swallow initiation at pyriform sinus;Penetration/Aspiration during swallow   ? Pharyngeal Material enters airway, remains ABOVE vocal cords and not ejected out;Material enters airway, remains ABOVE vocal cords then ejected out   ? Pharyngeal- Thin Straw Swallow initiation at pyriform sinus;Trace aspiration;Reduced airway/laryngeal closure;Penetration/Apiration after swallow   ? Pharyngeal Material enters airway, passes BELOW cords then ejected out   ?  ?  Pharyngeal - Solids  ? Pharyngeal- Puree Within functional limits   ? Pharyngeal- Mechanical Soft Penetration/Aspiration during swallow   ? Pharyngeal Material enters airway, remains ABOVE vocal cords then ejected out   ? Pharyngeal- Regular Penetration/Aspiration during swallow   ? Pharyngeal Material enters airway, remains ABOVE vocal cords then ejected out   ? Pharyngeal- Pill Within functional limits   ?  ? Electrical Stimulation - Pharyngeal Phase  ? Was Electrical Stimulation Used No   ? ?  ?  ? ?  ? ? ? Cricopharyngeal Phase - 09/02/21 1335   ? ?  ? Cervical Esophageal Phase  ? Cervical Esophageal Phase Within functional limits   ? ?  ?  ? ?  ? ? ? ? ? ? ? ? ? ? ? Plan - 09/02/21 1338   ? ? Clinical Impression Statement Pt presents with moderate oral phase and mi/mod pharyngeal phase dysphagia characterized by weak lingual manipulation, impaired labial seal, and impaired mastication resulting in impaired bolus cohesion and anterior posterior transist with solids, piecemeal deglutition, premature spillage with straw thins. Pharyngeal phase with  swallow trigger at the level of the valleculae (occasionally to the pyriforms with thins) and reduced laryngeal vestibule closure resulting in penetration during the swallow with thins and mech soft/regular which is removed with secondary swallow and cleared to esophagus. Pt with trace/min aspiration of thins with delayed weak cough with straw sips of thin (appeared to be removed). A chin tuck was implementend  and effective in reducing penetration and eliminating aspiration with straw sips, but she does require cueing to implement. Pt required verbal cues to chew the solid textures and manipulate the bolus oral

## 2021-09-04 ENCOUNTER — Encounter: Payer: Self-pay | Admitting: Adult Health

## 2021-09-04 ENCOUNTER — Non-Acute Institutional Stay (SKILLED_NURSING_FACILITY): Payer: Medicare Other | Admitting: Adult Health

## 2021-09-04 DIAGNOSIS — M5416 Radiculopathy, lumbar region: Secondary | ICD-10-CM | POA: Diagnosis not present

## 2021-09-04 DIAGNOSIS — G894 Chronic pain syndrome: Secondary | ICD-10-CM | POA: Diagnosis not present

## 2021-09-04 NOTE — Progress Notes (Signed)
?Location:  [place] ?Nursing Home Room Number: 450T ?Place of Service:  SNF (31) ?Provider:  Ok Edwards, NP ? ? ?CODE STATUS: DNR ? ?Allergies  ?Allergen Reactions  ? Oxycodone Other (See Comments)  ?  Can tolerate low dose mixed with tynlelol ?HALLUCINATIONS   ? Buspirone Other (See Comments)  ?  Unknown  ? Docosahexaenoic Acid Other (See Comments)  ? Eicosapentaenoic Acid   ? Eicosapentaenoic Acid ([place])   ? Lutein Nausea And Vomiting  ? Omega-3 Fatty Acids Other (See Comments)  ? Other   ? Aspirin Nausea And Vomiting and Other (See Comments)  ?  Severe stomach pain   ? Codeine Nausea Only  ?  Makes her feel very sick   ? ? ?Chief Complaint  ?Patient presents with  ? Acute Visit  ?  Back and leg pain  ? ? ?HPI: ? ?She has chronic back and left leg pain. She is unable to give details on the type of pain except to say that it hurts. She is presently taking cymbalta 40 mg daily has prn tylenol. She did tell me that she is sleeping at night.  ? ?Past Medical History:  ?Diagnosis Date  ? Anxiety   ? Arthritis   ? Complication of anesthesia   ? Depression   ? Depression with suicidal ideation 01/21/2021  ? Dysphasia   ? GERD (gastroesophageal reflux disease)   ? Hyperlipidemia   ? Hypertension   ? Parkinson's disease ([place])   ? diagnosed at age 67  ? 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 PONV (postoperative nausea and vomiting)   ? states it has been a long time since getting sick  ? Sleep apnea   ? Has CPAP but doesnt use  ? Stroke [facility])   ? TIA (transient ischemic attack)   ? not really TIA but abnormal MRI per pt left sided weakness  ? UTI (lower urinary tract infection)   ? Vitamin D deficiency   ? ? ?Past Surgical History:  ?Procedure Laterality Date  ? BACK SURGERY    ? Battery change    ? DBS, 12/2009  ? BIOPSY N/A 11/29/2013  ? Procedure: GASTRIC BIOPSY;  Surgeon: Danie Binder, MD;  [facility];  Service: Endoscopy;  Laterality: N/A;  ? BURR HOLE W/ STEREOTACTIC INSERTION OF DBS LEADS  / INTRAOP MICROELECTRODE RECORDING    ? 2007  ? CARPAL TUNNEL RELEASE Right   ? COLONOSCOPY WITH PROPOFOL N/A 11/29/2013  ? Procedure: COLONOSCOPY WITH PROPOFOL;  Surgeon: Danie Binder, MD;  [facility];  Service: Endoscopy;  Laterality: N/A;  entered cecum @ [phone]; total cecal withdrawal time 21 minutes   ? ELBOW SURGERY Left   ? after fx  ? ESOPHAGEAL DILATION N/A 11/29/2013  ? Procedure: ESOPHAGEAL DILATION;  Surgeon: Danie Binder, MD;  [facility];  Service: Endoscopy;  Laterality: N/A;  [place] 14/15/16  ? ESOPHAGOGASTRODUODENOSCOPY (EGD) WITH PROPOFOL N/A 11/29/2013  ? Procedure: ESOPHAGOGASTRODUODENOSCOPY (EGD) WITH PROPOFOL;  Surgeon: Danie Binder, MD;  [facility];  Service: Endoscopy;  Laterality: N/A;  ? FOOT SURGERY Left   ? "something with 2nd 2."  ? NECK SURGERY    ? Fusion  ? POLYPECTOMY N/A 11/29/2013  ? Procedure: POLYPECTOMY;  Surgeon: Danie Binder, MD;  [facility];  Service: Endoscopy;  Laterality: N/A;  Distal Transverse Colon x2 ?  ? PR ANALYZE NEUROSTIM BRAIN, FIRST 1H  10/15/2012  ?    ? PULSE GENERATOR  IMPLANT Right 03/30/2018  ? Procedure: Right Implantable pulse generator change;  Surgeon: Erline Levine, MD;  Location: [place];  Service: Neurosurgery;  Laterality: Right;  ? SUBTHALAMIC STIMULATOR BATTERY REPLACEMENT N/A 08/05/2013  ? Procedure: SUBTHALAMIC STIMULATOR BATTERY REPLACEMENT;  Surgeon: Erline Levine, MD;  Location: [place] [facility];  Service: Neurosurgery;  Laterality: N/A;  ? SUBTHALAMIC STIMULATOR BATTERY REPLACEMENT N/A 11/26/2015  ? Procedure: Implantable pulse generator battery change for deep brain stimulator;  Surgeon: Erline Levine, MD;  Location: [place] [facility];  Service: Neurosurgery;  Laterality: N/A;  ? SUBTHALAMIC STIMULATOR BATTERY REPLACEMENT Right 09/13/2020  ? Procedure: Right chest implantable pulse generator change for depleted battery;  Surgeon: Erline Levine, MD;  Location: [place];  Service: Neurosurgery;  Laterality: Right;  3C/RM 18  ? VAGINAL  HYSTERECTOMY    ? "partial"  ? WRIST SURGERY Right   ? after fx  ? ? ?Social History  ? ?Socioeconomic History  ? Marital status: Widowed  ?  Spouse name: Not on file  ? Number of children: Not on file  ? Years of education: Not on file  ? Highest education level: Not on file  ?Occupational History  ? Occupation: Retired  ?Tobacco Use  ? Smoking status: Former  ?  Packs/day: 0.50  ?  Years: 25.00  ?  Pack years: 12.50  ?  Types: Cigarettes  ?  Quit date: 03/01/2019  ?  Years since quitting: 2.5  ? Smokeless tobacco: Never  ?Vaping Use  ? Vaping Use: Never used  ?Substance and Sexual Activity  ? Alcohol use: Not Currently  ? Drug use: No  ? Sexual activity: Not on file  ?Other Topics Concern  ? Not on file  ?Social History Narrative  ? She resides at [facility] (assisted living facility).  ? ?Social Determinants of Health  ? ?Financial Resource Strain: Not on file  ?Food Insecurity: Not on file  ?Transportation Needs: Not on file  ?Physical Activity: Not on file  ?Stress: Not on file  ?Social Connections: Not on file  ?Intimate Partner Violence: Not on file  ? ?Family History  ?Problem Relation Age of Onset  ? Colon cancer Paternal Uncle   ? ? ? ? ?VITAL SIGNS ?BP 138/79   Pulse 89   Temp (!) 97.1 ?F (36.2 ?C)   Resp 20   Ht '5\' 2"'$  (1.575 m)   Wt 156 lb 3.2 oz (70.9 kg)   SpO2 97%   BMI 28.57 kg/m?  ? ?Outpatient Encounter Medications as of 09/04/2021  ?Medication Sig  ? acetaminophen (TYLENOL) 500 MG tablet Take 500 mg by mouth every 6 (six) hours as needed for mild pain or moderate pain.  ? ALPRAZolam (XANAX) 0.25 MG tablet Take 1 tablet (0.25 mg total) by mouth 2 (two) times daily. Major depressive disorder, recurrent, unspecified  ? amLODipine (NORVASC) 5 MG tablet Take 5 mg by mouth daily.  ? atorvastatin (LIPITOR) 40 MG tablet Take 1 tablet (40 mg total) by mouth daily.  ? BREZTRI AEROSPHERE 160-9-4.8 MCG/ACT AERO Inhale 2 puffs into the lungs 2 (two) times daily.  ? Camphor-Menthol-Methyl Sal (SALONPAS)  3.05-31-08 % [facility] Special Instructions: apply one pack to both sides of lower back daily and remove and at HS. for bilateral lower back pain. ?[DX: Chronic pain syndrome-per NP note]  ? carbidopa-levodopa (SINEMET IR) 25-100 MG tablet Take 1 tablet by mouth 2 (two) times daily.  ? Cholecalciferol 50 MCG (2000 UT) CAPS Take 1 capsule by mouth daily.  ? clopidogrel (PLAVIX)  75 MG tablet Take 75 mg by mouth daily.  ? Dextromethorphan-quiNIDine (NUEDEXTA) 20-10 MG capsule Take 1 capsule by mouth 2 (two) times daily.  ? docusate sodium (COLACE) 100 MG capsule Take 100 mg by mouth daily.  ? donepezil (ARICEPT) 5 MG tablet Take 5 mg by mouth at bedtime. 9 pm for parkinson's dementia  ? DULoxetine HCl 40 MG CSDR Take 40 mg by mouth daily.  ? Ensure (ENSURE) Take 237 mLs by mouth 2 (two) times daily between meals. 9 am and 9 pm  ? guaiFENesin (MUCINEX) 600 MG 12 hr tablet Take 600 mg by mouth daily as needed.  ? hydrocortisone 1 % lotion Special Instructions: left upper arm ?Every Shift - PRN  ? losartan (COZAAR) 100 MG tablet Take 100 mg by mouth daily.  ? meloxicam (MOBIC) 15 MG tablet Take 15 mg by mouth daily.  ? miconazole (ZEASORB-AF) 2 % powder Apply 1 application topically as needed for itching (Every Shift - PRN; PRN 1, PRN 2, PRN 3).  ? NON FORMULARY Diet: : Dys 3 (chopped) diet, continue thin liquids; will allow pizza if requested ? Liquids:Regular  ? omeprazole (PRILOSEC) 20 MG capsule Take 20 mg by mouth in the morning and at bedtime.   ? polyethylene glycol (MIRALAX / GLYCOLAX) 17 g packet Take 17 g by mouth daily as needed.  ? pramipexole (MIRAPEX) 0.5 MG tablet Take 0.5 mg by mouth at bedtime.  ? QUEtiapine (SEROQUEL) 25 MG tablet Take 25 mg by mouth daily.  ? traZODone (DESYREL) 50 MG tablet Take by mouth at bedtime.  ? albuterol (VENTOLIN HFA) 108 (90 Base) MCG/ACT inhaler Inhale 2 puffs into the lungs every 4 (four) hours as needed for wheezing or shortness of breath.  ? ?No facility-administered encounter  medications on file as of 09/04/2021.  ? ? ? ?SIGNIFICANT DIAGNOSTIC EXAMS ? ?LABS REVIEWED; PREVIOUS  ?  ?09-13-20: wbc 8.3; hgb 17.2; hct 52.6 mcv 95.8 plt 248; glucose 122; bun 15; creat 0.86; k+ 4.5; na++ 139; ca 9.9;

## 2021-09-19 ENCOUNTER — Other Ambulatory Visit: Payer: Self-pay | Admitting: Adult Health

## 2021-09-19 MED ORDER — ALPRAZOLAM 0.25 MG PO TABS: 0.2500 mg | ORAL_TABLET | Freq: Two times a day (BID) | ORAL | 0 refills | Status: DC

## 2021-09-24 ENCOUNTER — Encounter: Payer: Self-pay | Admitting: Adult Health

## 2021-09-24 ENCOUNTER — Non-Acute Institutional Stay (SKILLED_NURSING_FACILITY): Payer: Medicare Other | Admitting: Adult Health

## 2021-09-24 DIAGNOSIS — G20 Parkinson's disease: Secondary | ICD-10-CM | POA: Diagnosis not present

## 2021-09-24 DIAGNOSIS — R634 Abnormal weight loss: Secondary | ICD-10-CM | POA: Diagnosis not present

## 2021-09-24 NOTE — Progress Notes (Signed)
?Location:  [place] ?Nursing Home Room Number: 108-W ?Place of Service:  SNF (31) ? ? ?CODE STATUS: DNR ? ?Allergies  ?Allergen Reactions  ? Oxycodone Other (See Comments)  ?  Can tolerate low dose mixed with tynlelol ?HALLUCINATIONS   ? Buspirone Other (See Comments)  ?  Unknown  ? Docosahexaenoic Acid Other (See Comments)  ? Eicosapentaenoic Acid   ? Eicosapentaenoic Acid (Epa)   ? Lutein Nausea And Vomiting  ? Omega-3 Fatty Acids Other (See Comments)  ? Other   ? Aspirin Nausea And Vomiting and Other (See Comments)  ?  Severe stomach pain   ? Codeine Nausea Only  ?  Makes her feel very sick   ? ? ?Chief Complaint  ?Patient presents with  ? Acute Visit  ?  Weight loss  ? ? ?HPI: ? ?She is losing weight. Her weight on 06-28-21: 159.4 pounds; weight on 09-23-21 146.8 pounds with a loss of 9 pounds. She is having difficulty with chewing food on a mech soft diet. She fluctuates between being able to chew and needing a pureed diet. She is being seen by speech therapy. There are no reports of aspiration.  ? ?Past Medical History:  ?Diagnosis Date  ? Anxiety   ? Arthritis   ? Complication of anesthesia   ? Depression   ? Depression with suicidal ideation 01/21/2021  ? Dysphasia   ? GERD (gastroesophageal reflux disease)   ? Hyperlipidemia   ? Hypertension   ? Parkinson's disease ([place])   ? diagnosed at age 4  ? 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 PONV (postoperative nausea and vomiting)   ? states it has been a long time since getting sick  ? Sleep apnea   ? Has CPAP but doesnt use  ? Stroke [facility])   ? TIA (transient ischemic attack)   ? not really TIA but abnormal MRI per pt left sided weakness  ? UTI (lower urinary tract infection)   ? Vitamin D deficiency   ? ? ?Past Surgical History:  ?Procedure Laterality Date  ? BACK SURGERY    ? Battery change    ? DBS, 12/2009  ? BIOPSY N/A 11/29/2013  ? Procedure: GASTRIC BIOPSY;  Surgeon: Danie Binder, MD;  Location: [facility];  Service: Endoscopy;  Laterality:  N/A;  ? BURR HOLE W/ STEREOTACTIC INSERTION OF DBS LEADS / INTRAOP MICROELECTRODE RECORDING    ? 2007  ? CARPAL TUNNEL RELEASE Right   ? COLONOSCOPY WITH PROPOFOL N/A 11/29/2013  ? Procedure: COLONOSCOPY WITH PROPOFOL;  Surgeon: Danie Binder, MD;  Location: [facility];  Service: Endoscopy;  Laterality: N/A;  entered cecum @ [phone]; total cecal withdrawal time 21 minutes   ? ELBOW SURGERY Left   ? after fx  ? ESOPHAGEAL DILATION N/A 11/29/2013  ? Procedure: ESOPHAGEAL DILATION;  Surgeon: Danie Binder, MD;  [facility]: [facility];  Service: Endoscopy;  Laterality: N/A;  [facility] 14/15/16  ? ESOPHAGOGASTRODUODENOSCOPY (EGD) WITH PROPOFOL N/A 11/29/2013  ? Procedure: ESOPHAGOGASTRODUODENOSCOPY (EGD) WITH PROPOFOL;  Surgeon: Danie Binder, MD;  [facility];  Service: Endoscopy;  Laterality: N/A;  ? FOOT SURGERY Left   ? "something with 2nd 2."  ? NECK SURGERY    ? Fusion  ? POLYPECTOMY N/A 11/29/2013  ? Procedure: POLYPECTOMY;  Surgeon: Danie Binder, MD;  [facility];  Service: Endoscopy;  Laterality: N/A;  Distal Transverse Colon x2 ?  ? PR ANALYZE NEUROSTIM BRAIN, FIRST 1H  10/15/2012  ?    ?  PULSE GENERATOR IMPLANT Right 03/30/2018  ? Procedure: Right Implantable pulse generator change;  Surgeon: Erline Levine, MD;  Location: [place];  Service: Neurosurgery;  Laterality: Right;  ? SUBTHALAMIC STIMULATOR BATTERY REPLACEMENT N/A 08/05/2013  ? Procedure: SUBTHALAMIC STIMULATOR BATTERY REPLACEMENT;  Surgeon: Erline Levine, MD;  Location: [place] NEUR[facility] ORS;  Service: Neurosurgery;  Laterality: N/A;  ? SUBTHALAMIC STIMULATOR BATTERY REPLACEMENT N/A 11/26/2015  ? Procedure: Implantable pulse generator battery change for deep brain stimulator;  Surgeon: Erline Levine, MD;  Location: [place] NEURO ORS;  Service: Neurosurgery;  Laterality: N/A;  ? SUBTHALAMIC STIMULATOR BATTERY REPLACEMENT Right 09/13/2020  ? Procedure: Right chest implantable pulse generator change for depleted battery;  Surgeon: Erline Levine, MD;  Location: [place];  Service:  Neurosurgery;  Laterality: Right;  3C/RM 18  ? VAGINAL HYSTERECTOMY    ? "partial"  ? WRIST SURGERY Right   ? after fx  ? ? ?Social History  ? ?Socioeconomic History  ? Marital status: Widowed  ?  Spouse name: Not on file  ? Number of children: Not on file  ? Years of education: Not on file  ? Highest education level: Not on file  ?Occupational History  ? Occupation: Retired  ?Tobacco Use  ? Smoking status: Former  ?  Packs/day: 0.50  ?  Years: 25.00  ?  Pack years: 12.50  ?  Types: Cigarettes  ?  Quit date: 03/01/2019  ?  Years since quitting: 2.5  ? Smokeless tobacco: Never  ?Vaping Use  ? Vaping Use: Never used  ?Substance and Sexual Activity  ? Alcohol use: Not Currently  ? Drug use: No  ? Sexual activity: Not on file  ?Other Topics Concern  ? Not on file  ?Social History Narrative  ? She resides at [facility] (assisted living facility).  ? ?Social Determinants of Health  ? ?Financial Resource Strain: Not on file  ?Food Insecurity: Not on file  ?Transportation Needs: Not on file  ?Physical Activity: Not on file  ?Stress: Not on file  ?Social Connections: Not on file  ?Intimate Partner Violence: Not on file  ? ?Family History  ?Problem Relation Age of Onset  ? Colon cancer Paternal Uncle   ? ? ? ? ?VITAL SIGNS ?BP 102/62   Pulse 78   Temp 98.1 ?F (36.7 ?C)   Resp 20   Ht '5\' 2"'$  (1.575 m)   Wt 146 lb 12.8 oz (66.6 kg)   SpO2 94%   BMI 26.85 kg/m?  ? ?Outpatient Encounter Medications as of 09/24/2021  ?Medication Sig  ? acetaminophen (TYLENOL) 500 MG tablet Take 500 mg by mouth every 6 (six) hours as needed for mild pain or moderate pain.  ? albuterol (VENTOLIN HFA) 108 (90 Base) MCG/ACT inhaler Inhale 2 puffs into the lungs every 4 (four) hours as needed for wheezing or shortness of breath.  ? ALPRAZolam (XANAX) 0.25 MG tablet Take 1 tablet (0.25 mg total) by mouth 2 (two) times daily. Major depressive disorder, recurrent, unspecified  ? amLODipine (NORVASC) 5 MG tablet Take 5 mg by mouth daily.  ? BREZTRI  AEROSPHERE 160-9-4.8 MCG/ACT AERO Inhale 2 puffs into the lungs 2 (two) times daily.  ? Camphor-Menthol-Methyl Sal (SALONPAS) 3.05-31-08 % [facility] Special Instructions: apply one pack to both sides of lower back daily and remove and at HS. for bilateral lower back pain. ?[DX: Chronic pain syndrome-per NP note]  ? carbidopa-levodopa (SINEMET IR) 25-100 MG tablet Take 1 tablet by mouth 2 (two) times daily.  ? Cholecalciferol 50 MCG (  2000 UT) CAPS Take 1 capsule by mouth daily.  ? clopidogrel (PLAVIX) 75 MG tablet Take 75 mg by mouth daily.  ? Dextromethorphan-quiNIDine (NUEDEXTA) 20-10 MG capsule Take 1 capsule by mouth 2 (two) times daily.  ? docusate sodium (COLACE) 100 MG capsule Take 100 mg by mouth daily.  ? donepezil (ARICEPT) 5 MG tablet Take 5 mg by mouth at bedtime. 9 pm for parkinson's dementia  ? DULoxetine (CYMBALTA) 60 MG capsule Take 60 mg by mouth daily. 9 am  ? Ensure (ENSURE) Take 237 mLs by mouth 2 (two) times daily between meals. 9 am and 9 pm  ? guaiFENesin (MUCINEX) 600 MG 12 hr tablet Take 600 mg by mouth daily as needed.  ? hydrocortisone 1 % lotion Special Instructions: left upper arm ?Every Shift - PRN  ? losartan (COZAAR) 100 MG tablet Take 100 mg by mouth daily.  ? meloxicam (MOBIC) 15 MG tablet Take 15 mg by mouth daily.  ? miconazole (ZEASORB-AF) 2 % powder Apply 1 application topically as needed for itching (Every Shift - PRN; PRN 1, PRN 2, PRN 3).  ? NON FORMULARY Diet: : Dys 3 (chopped) diet, continue thin liquids; will allow pizza if requested ? Liquids:Regular  ? omeprazole (PRILOSEC) 20 MG capsule Take 20 mg by mouth in the morning and at bedtime.   ? polyethylene glycol (MIRALAX / GLYCOLAX) 17 g packet Take 17 g by mouth daily as needed.  ? pramipexole (MIRAPEX) 0.5 MG tablet Take 0.5 mg by mouth at bedtime.  ? QUEtiapine (SEROQUEL) 25 MG tablet Take 25 mg by mouth daily.  ? traZODone (DESYREL) 50 MG tablet Take by mouth at bedtime.  ? [DISCONTINUED] atorvastatin (LIPITOR) 40 MG tablet  Take 1 tablet (40 mg total) by mouth daily.  ? [DISCONTINUED] DULoxetine HCl 40 MG CSDR Take 40 mg by mouth daily.  ? ?No facility-administered encounter medications on file as of 09/24/2021.  ? ? ? ?SIGNIFICANT DIAGNOSTIC E

## 2021-10-08 ENCOUNTER — Encounter: Payer: Self-pay | Admitting: Internal Medicine

## 2021-10-08 ENCOUNTER — Non-Acute Institutional Stay (SKILLED_NURSING_FACILITY): Payer: Medicare Other | Admitting: Internal Medicine

## 2021-10-08 DIAGNOSIS — I10 Essential (primary) hypertension: Secondary | ICD-10-CM | POA: Diagnosis not present

## 2021-10-08 DIAGNOSIS — N182 Chronic kidney disease, stage 2 (mild): Secondary | ICD-10-CM

## 2021-10-08 DIAGNOSIS — G20 Parkinson's disease: Secondary | ICD-10-CM

## 2021-10-08 DIAGNOSIS — D751 Secondary polycythemia: Secondary | ICD-10-CM | POA: Diagnosis not present

## 2021-10-08 NOTE — Assessment & Plan Note (Signed)
Current CBC reveals minimal elevation of hemoglobin at 15.5 but was otherwise normal indicating resolution of the polycythemia. ?

## 2021-10-08 NOTE — Assessment & Plan Note (Signed)
BP controlled; no change in antihypertensive medications  

## 2021-10-08 NOTE — Patient Instructions (Signed)
See assessment and plan under each diagnosis in the problem list and acutely for this visit 

## 2021-10-08 NOTE — Assessment & Plan Note (Signed)
Current creatinine 0.98 with GFR greater than 60 indicating stable CKD stage II.  Med list reviewed; no indication for change in medications or dosage. ?

## 2021-10-08 NOTE — Progress Notes (Signed)
   NURSING HOME LOCATION: [facility] ROOM NUMBER: [id number]  CODE STATUS: Full code  PCP: Ok Edwards, NP  This is a nursing facility follow up visit of chronic medical diagnoses & to document compliance with Regulation 483.30 (c) in [employer] Manual Phase 2 which mandates caregiver visit ( visits can alternate among physician, PA or NP as per statutes) within 10 days of 30 days / 60 days/ 90 days post admission to SNF date    Interim medical record and care since last SNF visit was updated with review of diagnostic studies and change in clinical status since last visit were documented.  HPI: She is a permanent resident of facility with medical diagnoses of history of anxiety/depression with history of suicidal ideation; GERD; dyslipidemia, essential hypertension, Parkinson's disease complicated by dementia and dysphagia; history of stroke; vitamin D deficiency; and OSA. Significant procedures include stereotactic insertion of DBS leads; colonoscopy with polypectomy; EGD with esophageal dilation; and vaginal hysterectomy.  Most recent labs were performed 07/12/2021 because of Covid 19 positivity and revealed CKD stage II with a creatinine of 0.98 and GFR greater than 60.  Hemoglobin was minimally elevated at 15.5 but the CBC was otherwise completely within normal limits.  D-dimer and C-reactive protein were normal.  Last TSH was therapeutic at 1.431 on 04/11/2021.  A1c was prediabetic on that date with a value of 5.5%.  Review of systems could not be completed because of the profound dysphasia with garbled, indiscernible speech.  Physical exam:  Pertinent or positive findings: When I entered the room she was finishing her lunch and watching cartoons on TV.  She exhibited a wide-eyed stare.  Eyebrows are decreased laterally.  Mouth was agape; she has a few remaining teeth.  Initially the mouth drooped to the left but subsequently it appeared to do more so on the  right.  As noted speech is indiscernible.  She has a slight gallop cadence.  Breath sounds are decreased.  Pedal pulses are not palpable.  The right lower extremity is in a brace.  She is weak to opposition in the right upper and right lower extremities with essentially no opposition on the left.  She follows commands poorly with slow responses.  She has a rotatory tremor of the left upper extremity and side-to-side tremor of the left foot.  General appearance: no acute distress, increased work of breathing is present.   Lymphatic: No lymphadenopathy about the head, neck, axilla. Eyes: No conjunctival inflammation or lid edema is present. There is no scleral icterus. Ears:  External ear exam shows no significant lesions or deformities.   Nose:  External nasal examination shows no deformity or inflammation. Nasal mucosa are pink and moist without lesions, exudates Neck:  No thyromegaly, masses, tenderness noted.    Heart:  No murmur, click, rub .  Lungs:  without wheezes, rhonchi, rales, rubs. Abdomen: Bowel sounds are normal. Abdomen is soft and nontender with no organomegaly, hernias, masses. GU: Deferred  Extremities:  No cyanosis, clubbing, edema  Neurologic exam :Balance, Rhomberg, finger to nose testing could not be completed due to clinical state Skin: Warm & dry w/o tenting. No significant lesions or rash.  See summary under each active problem in the Problem List with associated updated therapeutic plan

## 2021-10-08 NOTE — Assessment & Plan Note (Addendum)
Parkinson's clinically stable but complicated by severe dysphasia which prevents completion of review of systems.  Additionally she has coarse tremor of the left upper and left lower extremity.  Facies are masked.

## 2021-10-14 ENCOUNTER — Other Ambulatory Visit: Payer: Self-pay | Admitting: Adult Health

## 2021-10-14 MED ORDER — ALPRAZOLAM 0.25 MG PO TABS: 0.2500 mg | ORAL_TABLET | Freq: Two times a day (BID) | ORAL | 0 refills | Status: DC

## 2021-10-18 ENCOUNTER — Encounter: Payer: Self-pay | Admitting: Adult Health

## 2021-10-18 ENCOUNTER — Non-Acute Institutional Stay (SKILLED_NURSING_FACILITY): Payer: Medicare Other | Admitting: Adult Health

## 2021-10-18 DIAGNOSIS — F02B2 Dementia in other diseases classified elsewhere, moderate, with psychotic disturbance: Secondary | ICD-10-CM | POA: Diagnosis not present

## 2021-10-18 DIAGNOSIS — G20 Parkinson's disease: Secondary | ICD-10-CM

## 2021-10-18 DIAGNOSIS — J449 Chronic obstructive pulmonary disease, unspecified: Secondary | ICD-10-CM | POA: Diagnosis not present

## 2021-10-18 NOTE — Progress Notes (Signed)
Location:  [place] Room Number: 108-W Place of Service:  SNF (31)   CODE STATUS: DNR  Allergies  Allergen Reactions   Oxycodone Other (See Comments)    Can tolerate low dose mixed with tynlelol HALLUCINATIONS    Buspirone Other (See Comments)    Unknown   Docosahexaenoic Acid Other (See Comments)   Eicosapentaenoic Acid    Eicosapentaenoic Acid (Epa)    Lutein Nausea And Vomiting   Omega-3 Fatty Acids Other (See Comments)   Other    Aspirin Nausea And Vomiting and Other (See Comments)    Severe stomach pain    Codeine Nausea Only    Makes her feel very sick     Chief Complaint  Patient presents with   Acute Visit    Care plan meeting    HPI:  We have come together for her care plan meeting. Family present  BIMS 15/15 mood 9/30: trouble concentrating; decreased energy; nervous at times; some depression. She is non ambulatory with no falls. She requires extensive assist to dependent for her adl care. She is incontinent of bladder and bowel. Dietary: 146.8 pounds down slightly from April.  D-3 with ground meat fair to 25-100% of meals; is on supplements as directed.at this time will forego dentures.  Therapy none at this time. She did enjoy 1 on 1 pet therapy; does not attend group activities.  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   PONV (postoperative nausea and vomiting)    states it has been a long time since getting sick   Sleep apnea    Has CPAP  but doesnt use   Stroke ([place])    TIA (transient ischemic attack)    not really TIA but abnormal MRI per pt left sided weakness   UTI (lower urinary tract infection)    Vitamin D deficiency     Past Surgical History:  Procedure Laterality Date   BACK SURGERY     Battery change     DBS, 12/2009   BIOPSY N/A 11/29/2013   Procedure: GASTRIC BIOPSY;  Surgeon: Danie Binder, MD;  Location: [facility];  Service: Endoscopy;  Laterality: N/A;   BURR HOLE W/ STEREOTACTIC INSERTION OF DBS LEADS / INTRAOP MICROELECTRODE RECORDING     2007   CARPAL TUNNEL RELEASE Right    COLONOSCOPY WITH PROPOFOL N/A 11/29/2013   Procedure: COLONOSCOPY WITH PROPOFOL;  Surgeon: Danie Binder, MD;  Location: [facility];  Service: Endoscopy;  Laterality: N/A;  entered cecum @ [phone]; total cecal withdrawal time 21 minutes    ELBOW SURGERY Left    after fx   ESOPHAGEAL DILATION N/A 11/29/2013   Procedure: ESOPHAGEAL DILATION;  Surgeon: Danie Binder, MD;  Location: [facility];  Service: Endoscopy;  Laterality: N/A;  [facility] 14/15/16   ESOPHAGOGASTRODUODENOSCOPY (EGD) WITH PROPOFOL N/A 11/29/2013   Procedure: ESOPHAGOGASTRODUODENOSCOPY (EGD) WITH PROPOFOL;  Surgeon: Danie Binder, MD;  [facility];  Service: Endoscopy;  Laterality:  N/A;   FOOT SURGERY Left    "something with 2nd 2."   NECK SURGERY     Fusion   POLYPECTOMY N/A 11/29/2013   Procedure: POLYPECTOMY;  Surgeon: Danie Binder, MD;  Location: [facility];  Service: Endoscopy;  Laterality: N/A;  Distal Transverse Colon x2    PR ANALYZE NEUROSTIM BRAIN, FIRST 1H  10/15/2012       PULSE GENERATOR IMPLANT Right 03/30/2018   Procedure: Right Implantable pulse generator change;  Surgeon: Erline Levine, MD;  Location: [place];  Service: Neurosurgery;  Laterality: Right;   SUBTHALAMIC STIMULATOR BATTERY REPLACEMENT N/A 08/05/2013   Procedure: SUBTHALAMIC STIMULATOR BATTERY REPLACEMENT;  Surgeon: Erline Levine, MD;  Location: [place] NEURO ORS;  Service: Neurosurgery;  Laterality: N/A;    SUBTHALAMIC STIMULATOR BATTERY REPLACEMENT N/A 11/26/2015   Procedure: Implantable pulse generator battery change for deep brain stimulator;  Surgeon: Erline Levine, MD;  Location: [place] NEURO ORS;  Service: Neurosurgery;  Laterality: N/A;   SUBTHALAMIC STIMULATOR BATTERY REPLACEMENT Right 09/13/2020   Procedure: Right chest implantable pulse generator change for depleted battery;  Surgeon: Erline Levine, MD;  Location: [place];  Service: Neurosurgery;  Laterality: Right;  3C/RM 18   VAGINAL HYSTERECTOMY     "partial"   WRIST SURGERY Right    after fx    Social History   Socioeconomic History   Marital status: Widowed    Spouse name: Not on file   Number of children: Not on file   Years of education: Not on file   Highest education level: Not on file  Occupational History   Occupation: Retired  Tobacco Use   Smoking status: Former    Packs/day: 0.50    Years: 25.00    Pack years: 12.50    Types: Cigarettes    Quit date: 03/01/2019    Years since quitting: 2.6   Smokeless tobacco: Never  Vaping Use   Vaping Use: Never used  Substance and Sexual Activity   Alcohol use: Not Currently   Drug use: No   Sexual activity: Not on file  Other Topics Concern   Not on file  Social History Narrative   She resides at [facility] (assisted living facility).   Social Determinants of Health   Financial Resource Strain: Not on file  Food Insecurity: Not on file  Transportation Needs: Not on file  Physical Activity: Not on file  Stress: Not on file  Social Connections: Not on file  Intimate Partner Violence: Not on file   Family History  Problem Relation Age of Onset   Colon cancer Paternal Uncle       VITAL SIGNS BP 114/72   Pulse 64   Temp 97.6 F (36.4 C)   Resp 20   Ht '5\' 2"'$  (1.575 m)   Wt 146 lb 12.8 oz (66.6 kg)   SpO2 96%   BMI 26.85 kg/m   Outpatient Encounter Medications as of 10/18/2021  Medication Sig   acetaminophen (TYLENOL) 500 MG tablet Take 500 mg by mouth  every 8 (eight) hours.   albuterol (VENTOLIN HFA) 108 (90 Base) MCG/ACT inhaler Inhale 2 puffs into the lungs every 4 (four) hours as needed for wheezing or shortness of breath.   ALPRAZolam (XANAX) 0.25 MG tablet Take 1 tablet (0.25 mg total) by mouth 2 (two) times daily. Major depressive disorder, recurrent, unspecified   amLODipine (NORVASC) 5 MG tablet Take 5 mg by mouth daily.   BREZTRI AEROSPHERE 160-9-4.8 MCG/ACT AERO Inhale 2 puffs into the lungs 2 (  two) times daily.   Camphor-Menthol-Methyl Sal (SALONPAS) 3.05-31-08 % [facility] Special Instructions: apply one pack to both sides of lower back daily and remove and at HS. for bilateral lower back pain. [DX: Chronic pain syndrome-per NP note]   carbidopa-levodopa (SINEMET IR) 25-100 MG tablet Take 1 tablet by mouth 2 (two) times daily.   Cholecalciferol 50 MCG (2000 UT) CAPS Take 1 capsule by mouth daily.   clopidogrel (PLAVIX) 75 MG tablet Take 75 mg by mouth daily.   Dextromethorphan-quiNIDine (NUEDEXTA) 20-10 MG capsule Take 1 capsule by mouth 2 (two) times daily.   docusate sodium (COLACE) 100 MG capsule Take 100 mg by mouth daily.   donepezil (ARICEPT) 5 MG tablet Take 5 mg by mouth at bedtime. 9 pm for parkinson's dementia   DULoxetine (CYMBALTA) 60 MG capsule Take 60 mg by mouth daily. 9 am   Ensure (ENSURE) Take 237 mLs by mouth 2 (two) times daily between meals. 9 am and 9 pm   guaiFENesin (MUCINEX) 600 MG 12 hr tablet Take 600 mg by mouth daily as needed.   hydrocortisone 1 % lotion Special Instructions: left upper arm Every Shift - PRN   losartan (COZAAR) 100 MG tablet Take 100 mg by mouth daily.   meloxicam (MOBIC) 15 MG tablet Take 15 mg by mouth daily.   miconazole (ZEASORB-AF) 2 % powder Apply 1 application topically as needed for itching (Every Shift - PRN; PRN 1, PRN 2, PRN 3).   NON FORMULARY Diet: : Dys 3 (chopped) diet, continue thin liquids; will allow pizza if requested  Liquids:Regular   omeprazole (PRILOSEC) 20 MG capsule  Take 20 mg by mouth in the morning and at bedtime.    polyethylene glycol (MIRALAX / GLYCOLAX) 17 g packet Take 17 g by mouth daily as needed.   pramipexole (MIRAPEX) 0.5 MG tablet Take 0.5 mg by mouth at bedtime.   QUEtiapine (SEROQUEL) 25 MG tablet Take 25 mg by mouth daily.   traZODone (DESYREL) 50 MG tablet Take by mouth at bedtime.   No facility-administered encounter medications on file as of 10/18/2021.     SIGNIFICANT DIAGNOSTIC EXAMS  LABS REVIEWED; PREVIOUS    12-13-20: urine culture multiple species 01-17-21: wbc 7.1; hgb 14.8; hct 43.5; mcv 93.8 plt 239 01-21-21: wbc 7.7; hgb 15.9; hct 46.8; mcv 92.3 plt 260; glucose 117; bun 13; creat 0.80; k+ 3.3; na++ 138; ca 9.2; GFR>60 liver normal albumin 4.2  01-24-21: glucose 107; bun 23; creat 1.01; k+ 3.4; na++ 137; ca 8.8; GFR >58  02-14-21: glucose 137; bun 19; creat 0.77; k+ 3.7; na++ 137; ca 8.9; GFR>60 04-11-21: hgb a1c 5.5; tsh 1.431 free t4: 1.12 05-30-21: glucose 106; bun 26; creat 0.95; k+ 4.0; na++ 138; ca 9.6; GFR>60 07-12-21: wbc 7.5; hgb 15.5; hct 46.0; mcv 97.5 plt 252; glucose 112; bun 31; creat 0.98; k+ 4.0; na++ 138; ca 9.5 GFR>60   NO NEW LABS.   Review of Systems  Reason unable to perform ROS: unable to fully participate.    Physical Exam Constitutional:      General: She is not in acute distress.    Appearance: She is well-developed. She is not diaphoretic.  Neck:     Thyroid: No thyromegaly.  Cardiovascular:     Rate and Rhythm: Normal rate and regular rhythm.     Pulses: Normal pulses.     Heart sounds: Normal heart sounds.  Pulmonary:     Effort: Pulmonary effort is normal. No respiratory distress.  Breath sounds: Normal breath sounds.  Abdominal:     General: Bowel sounds are normal. There is no distension.     Palpations: Abdomen is soft.     Tenderness: There is no abdominal tenderness.  Musculoskeletal:     Cervical back: Neck supple.     Right lower leg: No edema.     Left lower leg: No  edema.     Comments: Has left side weakness Leans head to the left  Bilateral hand tremor left >right  Has bilateral eyebrow movement Right arm shrug   Lymphadenopathy:     Cervical: No cervical adenopathy.  Skin:    General: Skin is warm and dry.  Neurological:     Mental Status: She is alert. Mental status is at baseline.  Psychiatric:        Mood and Affect: Mood normal.      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

## 2021-11-06 ENCOUNTER — Non-Acute Institutional Stay (SKILLED_NURSING_FACILITY): Payer: Medicare Other | Admitting: Adult Health

## 2021-11-06 ENCOUNTER — Encounter: Payer: Self-pay | Admitting: Adult Health

## 2021-11-06 DIAGNOSIS — F482 Pseudobulbar affect: Secondary | ICD-10-CM | POA: Diagnosis not present

## 2021-11-06 DIAGNOSIS — E782 Mixed hyperlipidemia: Secondary | ICD-10-CM | POA: Diagnosis not present

## 2021-11-06 DIAGNOSIS — N3281 Overactive bladder: Secondary | ICD-10-CM

## 2021-11-06 NOTE — Progress Notes (Signed)
Location:  [place] Room Number: 108 Place of Service:  SNF (31)   CODE STATUS: dnr   Allergies  Allergen Reactions   Oxycodone Other (See Comments)    Can tolerate low dose mixed with tynlelol HALLUCINATIONS    Buspirone Other (See Comments)    Unknown   Docosahexaenoic Acid Other (See Comments)   Eicosapentaenoic Acid    Eicosapentaenoic Acid (Epa)    Lutein Nausea And Vomiting   Omega-3 Fatty Acids Other (See Comments)   Other    Aspirin Nausea And Vomiting and Other (See Comments)    Severe stomach pain    Codeine Nausea Only    Makes her feel very sick     Chief Complaint  Patient presents with   Medical Management of Chronic Issues                     OAB (over active bladder)  Mixed hyperlipidemia: PBA     HPI:  She is a 76 year old long term long term resident of this facility being seen for the management of her chronic illnesses: OAB (over active bladder)  Mixed hyperlipidemia: PBA. There are no reports of anxiety present. There are no reports of pain present. She is slowly losing weight; with current weight: 146 pounds.   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   PONV (postoperative nausea and vomiting)    states it has been a long time since getting sick   Sleep apnea    Has CPAP but doesnt use   Stroke [facility])    TIA (transient ischemic attack)    not really TIA but abnormal MRI per pt left sided weakness   UTI (lower urinary tract infection)    Vitamin D deficiency     Past Surgical History:  Procedure Laterality Date   BACK SURGERY     Battery change     DBS, 12/2009   BIOPSY N/A 11/29/2013   Procedure: GASTRIC BIOPSY;   Surgeon: Danie Binder, MD;  Location: [facility]S;  Service: Endoscopy;  Laterality: N/A;   BURR HOLE W/ STEREOTACTIC INSERTION OF DBS LEADS / INTRAOP MICROELECTRODE RECORDING     2007   CARPAL TUNNEL RELEASE Right    COLONOSCOPY WITH PROPOFOL N/A 11/29/2013   Procedure: COLONOSCOPY WITH PROPOFOL;  Surgeon: Danie Binder, MD;  Location: [facility];  Service: Endoscopy;  Laterality: N/A;  entered cecum @ [phone]; total cecal withdrawal time 21 minutes    ELBOW SURGERY Left    after fx   ESOPHAGEAL DILATION N/A 11/29/2013   Procedure: ESOPHAGEAL DILATION;  Surgeon: Danie Binder, MD;  Location: [facility];  Service: Endoscopy;  Laterality: N/A;  [place] 14/15/16   ESOPHAGOGASTRODUODENOSCOPY (EGD) WITH PROPOFOL N/A 11/29/2013   Procedure: ESOPHAGOGASTRODUODENOSCOPY (EGD) WITH PROPOFOL;  Surgeon: Danie Binder, MD;  Location: [facility];  Service: Endoscopy;  Laterality: N/A;   FOOT SURGERY Left    "something with 2nd 2."   NECK SURGERY     Fusion   POLYPECTOMY N/A 11/29/2013   Procedure: POLYPECTOMY;  Surgeon: Danie Binder, MD;  Location: [facility];  Service: Endoscopy;  Laterality: N/A;  Distal Transverse Colon x2    PR ANALYZE NEUROSTIM BRAIN, FIRST 1H  10/15/2012       PULSE GENERATOR IMPLANT Right 03/30/2018   Procedure: Right Implantable pulse generator change;  Surgeon: Erline Levine, MD;  Location: [place];  Service: Neurosurgery;  Laterality: Right;   SUBTHALAMIC STIMULATOR BATTERY REPLACEMENT N/A 08/05/2013   Procedure: SUBTHALAMIC STIMULATOR BATTERY REPLACEMENT;  Surgeon: Erline Levine, MD;  Location: [place] NEURO ORS;  Service: Neurosurgery;  Laterality: N/A;   SUBTHALAMIC STIMULATOR BATTERY REPLACEMENT N/A 11/26/2015   Procedure: Implantable pulse generator battery change for deep brain stimulator;  Surgeon: Erline Levine, MD;  Location: [place] NEURO ORS;  Service: Neurosurgery;  Laterality: N/A;   SUBTHALAMIC STIMULATOR BATTERY REPLACEMENT Right 09/13/2020   Procedure: Right chest implantable pulse generator change for  depleted battery;  Surgeon: Erline Levine, MD;  Location: [place];  Service: Neurosurgery;  Laterality: Right;  3C/RM 18   VAGINAL HYSTERECTOMY     "partial"   WRIST SURGERY Right    after fx    Social History   Socioeconomic History   Marital status: Widowed    Spouse name: Not on file   Number of children: Not on file   Years of education: Not on file   Highest education level: Not on file  Occupational History   Occupation: Retired  Tobacco Use   Smoking status: Former    Packs/day: 0.50    Years: 25.00    Total pack years: 12.50    Types: Cigarettes    Quit date: 03/01/2019    Years since quitting: 2.6   Smokeless tobacco: Never  Vaping Use   Vaping Use: Never used  Substance and Sexual Activity   Alcohol use: Not Currently   Drug use: No   Sexual activity: Not on file  Other Topics Concern   Not on file  Social History Narrative   She resides at [facility] (assisted living facility).   Social Determinants of Health   Financial Resource Strain: Low Risk  (03/29/2019)   Overall Financial Resource Strain (CARDIA)    Difficulty of Paying Living Expenses: Not very hard  Food Insecurity: Not on file  Transportation Needs: No Transportation Needs (03/29/2019)   PRAPARE - Hydrologist (Medical): No    Lack of Transportation (Non-Medical): No  Physical Activity: Not on file  Stress: Not on file  Social Connections: Moderately Isolated (03/29/2019)   Social Connection and Isolation Panel [NHANES]    Frequency of Communication with Friends and Family: More than three times a week    Frequency of Social Gatherings with Friends and Family: Once a week    Attends Religious Services: 1 to 4 times per year    Active Member of [employer] or Organizations: No    Attends Archivist Meetings: Never    Marital Status: Widowed  Intimate Partner Violence: Unknown (03/29/2019)   Humiliation, Afraid, Rape, and Kick questionnaire    Fear of Current or  Ex-Partner: No    Emotionally Abused: Not on file    Physically Abused: Not on file    Sexually Abused: Not on file   Family History  Problem Relation Age of Onset   Colon cancer Paternal Uncle       VITAL SIGNS BP 126/68   Pulse 74   Temp 98.1 F (36.7 C)   Resp 20   Ht '5\' 2"'$  (1.575 m)   Wt 146 lb 3.2 oz (  66.3 kg)   BMI 26.74 kg/m   Outpatient Encounter Medications as of 11/06/2021  Medication Sig   acetaminophen (TYLENOL) 500 MG tablet Take 500 mg by mouth every 8 (eight) hours.   albuterol (VENTOLIN HFA) 108 (90 Base) MCG/ACT inhaler Inhale 2 puffs into the lungs every 4 (four) hours as needed for wheezing or shortness of breath.   ALPRAZolam (XANAX) 0.25 MG tablet Take 1 tablet (0.25 mg total) by mouth 2 (two) times daily. Major depressive disorder, recurrent, unspecified   amLODipine (NORVASC) 5 MG tablet Take 5 mg by mouth daily.   BREZTRI AEROSPHERE 160-9-4.8 MCG/ACT AERO Inhale 2 puffs into the lungs 2 (two) times daily.   Camphor-Menthol-Methyl Sal (SALONPAS) 3.05-31-08 % [facility] Special Instructions: apply one pack to both sides of lower back daily and remove and at HS. for bilateral lower back pain. [DX: Chronic pain syndrome-per NP note]   carbidopa-levodopa (SINEMET IR) 25-100 MG tablet Take 1 tablet by mouth 2 (two) times daily.   Cholecalciferol 50 MCG (2000 UT) CAPS Take 1 capsule by mouth daily.   clopidogrel (PLAVIX) 75 MG tablet Take 75 mg by mouth daily.   Dextromethorphan-quiNIDine (NUEDEXTA) 20-10 MG capsule Take 1 capsule by mouth 2 (two) times daily.   docusate sodium (COLACE) 100 MG capsule Take 100 mg by mouth daily.   donepezil (ARICEPT) 5 MG tablet Take 5 mg by mouth at bedtime. 9 pm for parkinson's dementia   DULoxetine (CYMBALTA) 60 MG capsule Take 60 mg by mouth daily. 9 am   Ensure (ENSURE) Take 237 mLs by mouth 2 (two) times daily between meals. 9 am and 9 pm   guaiFENesin (MUCINEX) 600 MG 12 hr tablet Take 600 mg by mouth daily as needed.    hydrocortisone 1 % lotion Special Instructions: left upper arm Every Shift - PRN   losartan (COZAAR) 100 MG tablet Take 100 mg by mouth daily.   meloxicam (MOBIC) 15 MG tablet Take 15 mg by mouth daily.   miconazole (ZEASORB-AF) 2 % powder Apply 1 application topically as needed for itching (Every Shift - PRN; PRN 1, PRN 2, PRN 3).   NON FORMULARY Diet: : Dys 3 (chopped) diet, continue thin liquids; will allow pizza if requested  Liquids:Regular   omeprazole (PRILOSEC) 20 MG capsule Take 20 mg by mouth in the morning and at bedtime.    polyethylene glycol (MIRALAX / GLYCOLAX) 17 g packet Take 17 g by mouth daily as needed.   pramipexole (MIRAPEX) 0.5 MG tablet Take 0.5 mg by mouth at bedtime.   QUEtiapine (SEROQUEL) 25 MG tablet Take 25 mg by mouth daily.   traZODone (DESYREL) 50 MG tablet Take by mouth at bedtime.   No facility-administered encounter medications on file as of 11/06/2021.     SIGNIFICANT DIAGNOSTIC EXAMS  LABS REVIEWED; PREVIOUS    12-13-20: urine culture multiple species 01-17-21: wbc 7.1; hgb 14.8; hct 43.5; mcv 93.8 plt 239 01-21-21: wbc 7.7; hgb 15.9; hct 46.8; mcv 92.3 plt 260; glucose 117; bun 13; creat 0.80; k+ 3.3; na++ 138; ca 9.2; GFR>60 liver normal albumin 4.2  01-24-21: glucose 107; bun 23; creat 1.01; k+ 3.4; na++ 137; ca 8.8; GFR >58  02-14-21: glucose 137; bun 19; creat 0.77; k+ 3.7; na++ 137; ca 8.9; GFR>60 04-11-21: hgb a1c 5.5; tsh 1.431 free t4: 1.12 05-30-21: glucose 106; bun 26; creat 0.95; k+ 4.0; na++ 138; ca 9.6; GFR>60 07-12-21: wbc 7.5; hgb 15.5; hct 46.0; mcv 97.5 plt 252; glucose 112; bun 31; creat 0.98;  k+ 4.0; na++ 138; ca 9.5 GFR>60   NO NEW LABS.   Review of Systems  Reason unable to perform ROS: unable to participate fully.    Physical Exam Constitutional:      General: She is not in acute distress.    Appearance: She is well-developed. She is not diaphoretic.  Neck:     Thyroid: No thyromegaly.  Cardiovascular:     Rate and Rhythm:  Normal rate and regular rhythm.     Pulses: Normal pulses.     Heart sounds: Normal heart sounds.  Pulmonary:     Effort: Pulmonary effort is normal. No respiratory distress.     Breath sounds: Normal breath sounds.  Abdominal:     General: Bowel sounds are normal. There is no distension.     Palpations: Abdomen is soft.     Tenderness: There is no abdominal tenderness.  Musculoskeletal:     Cervical back: Neck supple.     Right lower leg: No edema.     Left lower leg: No edema.     Comments:  Has left side weakness Leans head to the left  Bilateral hand tremor left >right  Has bilateral eyebrow movement Right arm shrug  Lymphadenopathy:     Cervical: No cervical adenopathy.  Skin:    General: Skin is warm and dry.  Neurological:     Mental Status: She is alert. Mental status is at baseline.  Psychiatric:        Mood and Affect: Mood normal.      ASSESSMENT/ PLAN:  TODAY  OAB (over active bladder) is completely incontinent off ditropan   2. Mixed hyperlipidemia: ldl 44; will continue lipitor 40 mg; will recheck lipids based upon results will adjust lipitor  3. PBA is stable will continue nuedexta 10/20 mg twice daily   PREVIOUS   4. 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 COPD with exacerbation: is stable will continue breztri aerosphere 160-4.8 mcg 2 puffs twice daily   6. GERD without esophagitis: is stable will continue prilosec 20 mg daily   7. Chronic constipation: is stable will continue colace 100 mg daily and miralax daily as needed   8. Chronic pain syndrome: is stable will continue cymbalta 60 mg daily (also takes for mood state); salon patches 2 to her back  2. Parkinson's disease related dementia: is without change weight is 146  pounds will continue aricept 5 mg nightly   3. Essential hypertension: b/p 126/68 will continue  norvasc  5 mg daily and will continue cozaar 100 mg daily   4. Parkinson's disease is stable is status post deep brain stimulator: will continue sinemet IR 25/100 mg twice daily mirapex 0.5 mg nightly     Ok Edwards NP [facility]  call [phone]

## 2021-11-07 ENCOUNTER — Other Ambulatory Visit (HOSPITAL_COMMUNITY)
Admission: RE | Admit: 2021-11-07 | Discharge: 2021-11-07 | Disposition: A | Discharge status: Home / Self Care | Payer: Medicare Other | Source: Skilled Nursing Facility | Attending: Adult Health | Admitting: Adult Health

## 2021-11-07 DIAGNOSIS — E782 Mixed hyperlipidemia: Secondary | ICD-10-CM | POA: Insufficient documentation

## 2021-11-07 LAB — LIPID PANEL
Cholesterol: 134 mg/dL (ref 0–200)
HDL: 33 mg/dL — ABNORMAL LOW (ref >40)
LDL Cholesterol: 66 mg/dL (ref 0–99)
Total CHOL/HDL Ratio: 4.1 RATIO
Triglycerides: 173 mg/dL — ABNORMAL HIGH (ref <150)
VLDL: 35 mg/dL (ref 0–40)

## 2021-11-07 LAB — HEPATITIS C ANTIBODY: HCV Ab: NONREACTIVE

## 2021-11-12 ENCOUNTER — Other Ambulatory Visit: Payer: Self-pay | Admitting: Adult Health

## 2021-11-12 MED ORDER — ALPRAZOLAM 0.25 MG PO TABS: 0.2500 mg | ORAL_TABLET | Freq: Two times a day (BID) | ORAL | 0 refills | Status: DC

## 2021-12-09 ENCOUNTER — Non-Acute Institutional Stay (SKILLED_NURSING_FACILITY): Payer: Medicare Other | Admitting: Adult Health

## 2021-12-09 ENCOUNTER — Encounter: Payer: Self-pay | Admitting: Adult Health

## 2021-12-09 DIAGNOSIS — K5909 Other constipation: Secondary | ICD-10-CM

## 2021-12-09 DIAGNOSIS — K219 Gastro-esophageal reflux disease without esophagitis: Secondary | ICD-10-CM | POA: Diagnosis not present

## 2021-12-09 DIAGNOSIS — J449 Chronic obstructive pulmonary disease, unspecified: Secondary | ICD-10-CM

## 2021-12-09 DIAGNOSIS — F323 Major depressive disorder, single episode, severe with psychotic features: Secondary | ICD-10-CM | POA: Diagnosis not present

## 2021-12-09 NOTE — Progress Notes (Unsigned)
Location:  [place] Room Number: 108-W Place of Service:  SNF (31)   CODE STATUS: DNR  Allergies  Allergen Reactions   Oxycodone Other (See Comments)    Can tolerate low dose mixed with tynlelol HALLUCINATIONS    Buspar [Buspirone] Other (See Comments)    Unknown   Docosahexaenoic Acid Other (See Comments)   Eicosapentaenoic Acid    Eicosapentaenoic Acid (Epa)    Lutein Nausea And Vomiting   Omega-3 Fatty Acids Other (See Comments)   Other    Aspirin Nausea And Vomiting and Other (See Comments)    Severe stomach pain    Codeine Nausea Only    Makes her feel very sick     Chief Complaint  Patient presents with   Medical Management of Chronic Issues    Routine Visit.    Health Maintenance    Discuss the need for Dexa Scan.    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   PONV (postoperative nausea and vomiting)    states it has been a long time since getting sick   Sleep apnea    Has CPAP but doesnt use   Stroke [facility])    TIA (transient ischemic attack)    not really TIA but abnormal MRI per pt left sided weakness   UTI (lower urinary tract infection)    Vitamin D deficiency     Past Surgical History:  Procedure Laterality Date   BACK SURGERY     Battery change     DBS, 12/2009   BIOPSY N/A 11/29/2013   Procedure: GASTRIC BIOPSY;  Surgeon: Danie Binder, MD;  Location: [facility];  Service: Endoscopy;  Laterality: N/A;   BURR HOLE W/ STEREOTACTIC INSERTION OF DBS LEADS / INTRAOP MICROELECTRODE RECORDING     2007   CARPAL TUNNEL RELEASE Right    COLONOSCOPY WITH PROPOFOL N/A 11/29/2013   Procedure: COLONOSCOPY WITH PROPOFOL;   Surgeon: Danie Binder, MD;  Location: [facility];  Service: Endoscopy;  Laterality: N/A;  entered cecum @ [phone]; total cecal withdrawal time 21 minutes    ELBOW SURGERY Left    after fx   ESOPHAGEAL DILATION N/A 11/29/2013   Procedure: ESOPHAGEAL DILATION;  Surgeon: Danie Binder, MD;  Location: [facility];  Service: Endoscopy;  Laterality: N/A;  [place] 14/15/16   ESOPHAGOGASTRODUODENOSCOPY (EGD) WITH PROPOFOL N/A 11/29/2013   Procedure: ESOPHAGOGASTRODUODENOSCOPY (EGD) WITH PROPOFOL;  Surgeon: Danie Binder, MD;  Location: [facility];  Service: Endoscopy;  Laterality: N/A;   FOOT SURGERY Left    "something with 2nd 2."   NECK SURGERY     Fusion   POLYPECTOMY N/A 11/29/2013   Procedure: POLYPECTOMY;  Surgeon: Danie Binder, MD;  Location: [facility];  Service: Endoscopy;  Laterality: N/A;  Distal Transverse Colon x2    PR ANALYZE NEUROSTIM BRAIN, FIRST 1H  10/15/2012       PULSE GENERATOR IMPLANT Right 03/30/2018   Procedure: Right Implantable pulse generator change;  Surgeon: Erline Levine, MD;  Location: [place];  Service: Neurosurgery;  Laterality: Right;   SUBTHALAMIC STIMULATOR BATTERY REPLACEMENT N/A 08/05/2013  Procedure: SUBTHALAMIC STIMULATOR BATTERY REPLACEMENT;  Surgeon: Erline Levine, MD;  Location: [place] NEURO ORS;  Service: Neurosurgery;  Laterality: N/A;   SUBTHALAMIC STIMULATOR BATTERY REPLACEMENT N/A 11/26/2015   Procedure: Implantable pulse generator battery change for deep brain stimulator;  Surgeon: Erline Levine, MD;  Location: [place] NEURO ORS;  Service: Neurosurgery;  Laterality: N/A;   SUBTHALAMIC STIMULATOR BATTERY REPLACEMENT Right 09/13/2020   Procedure: Right chest implantable pulse generator change for depleted battery;  Surgeon: Erline Levine, MD;  Location: [place];  Service: Neurosurgery;  Laterality: Right;  3C/RM 18   VAGINAL HYSTERECTOMY     "partial"   WRIST SURGERY Right    after fx    Social History   Socioeconomic History   Marital status: Widowed    Spouse name: Not on file    Number of children: Not on file   Years of education: Not on file   Highest education level: Not on file  Occupational History   Occupation: Retired  Tobacco Use   Smoking status: Former    Packs/day: 0.50    Years: 25.00    Total pack years: 12.50    Types: Cigarettes    Quit date: 03/01/2019    Years since quitting: 2.7   Smokeless tobacco: Never  Vaping Use   Vaping Use: Never used  Substance and Sexual Activity   Alcohol use: Not Currently   Drug use: No   Sexual activity: Not on file  Other Topics Concern   Not on file  Social History Narrative   She resides at [facility] (assisted living facility).   Social Determinants of Health   Financial Resource Strain: Low Risk  (03/29/2019)   Overall Financial Resource Strain (CARDIA)    Difficulty of Paying Living Expenses: Not very hard  Food Insecurity: Not on file  Transportation Needs: No Transportation Needs (03/29/2019)   PRAPARE - Hydrologist (Medical): No    Lack of Transportation (Non-Medical): No  Physical Activity: Not on file  Stress: Not on file  Social Connections: Moderately Isolated (03/29/2019)   Social Connection and Isolation Panel [NHANES]    Frequency of Communication with Friends and Family: More than three times a week    Frequency of Social Gatherings with Friends and Family: Once a week    Attends Religious Services: 1 to 4 times per year    Active Member of [employer] or Organizations: No    Attends Archivist Meetings: Never    Marital Status: Widowed  Intimate Partner Violence: Unknown (03/29/2019)   Humiliation, Afraid, Rape, and Kick questionnaire    Fear of Current or Ex-Partner: No    Emotionally Abused: Not on file    Physically Abused: Not on file    Sexually Abused: Not on file   Family History  Problem Relation Age of Onset   Colon cancer Paternal Uncle       VITAL SIGNS BP 136/64   Pulse 73   Temp (!) 97.4 F (36.3 C)   Resp 18   Ht '5\' 2"'$   (1.575 m)   Wt 144 lb (65.3 kg)   SpO2 97%   BMI 26.34 kg/m   Outpatient Encounter Medications as of 12/09/2021  Medication Sig   acetaminophen (TYLENOL) 500 MG tablet Take 500 mg by mouth every 8 (eight) hours.   albuterol (VENTOLIN HFA) 108 (90 Base) MCG/ACT inhaler Inhale 2 puffs into the lungs every 4 (four) hours as needed for wheezing or shortness of breath.  ALPRAZolam (XANAX) 0.25 MG tablet Take 1 tablet (0.25 mg total) by mouth 2 (two) times daily. Major depressive disorder, recurrent, unspecified   amLODipine (NORVASC) 5 MG tablet Take 5 mg by mouth daily.   BREZTRI AEROSPHERE 160-9-4.8 MCG/ACT AERO Inhale 2 puffs into the lungs 2 (two) times daily.   Camphor-Menthol-Methyl Sal (SALONPAS) 3.05-31-08 % [facility] Special Instructions: apply one pack to both sides of lower back daily and remove and at HS. for bilateral lower back pain. [DX: Chronic pain syndrome-per NP note]   carbidopa-levodopa (SINEMET IR) 25-100 MG tablet Take 1 tablet by mouth 2 (two) times daily.   Cholecalciferol 50 MCG (2000 UT) CAPS Take 1 capsule by mouth daily.   clopidogrel (PLAVIX) 75 MG tablet Take 75 mg by mouth daily.   Dextromethorphan-quiNIDine (NUEDEXTA) 20-10 MG capsule Take 1 capsule by mouth 2 (two) times daily.   docusate sodium (COLACE) 100 MG capsule Take 100 mg by mouth daily.   donepezil (ARICEPT) 5 MG tablet Take 5 mg by mouth at bedtime. 9 pm for parkinson's dementia   DULoxetine (CYMBALTA) 60 MG capsule Take 60 mg by mouth daily. 9 am   Ensure (ENSURE) Take 237 mLs by mouth 2 (two) times daily between meals. 9 am and 9 pm   guaiFENesin (MUCINEX) 600 MG 12 hr tablet Take 600 mg by mouth daily as needed.   hydrocortisone 1 % lotion Special Instructions: left upper arm Every Shift - PRN   losartan (COZAAR) 100 MG tablet Take 100 mg by mouth daily.   meloxicam (MOBIC) 15 MG tablet Take 15 mg by mouth daily.   miconazole (ZEASORB-AF) 2 % powder Apply 1 application topically as needed for itching  (Every Shift - PRN; PRN 1, PRN 2, PRN 3).   NON FORMULARY Diet: : Dys 3 (chopped) diet, continue thin liquids; will allow pizza if requested  Liquids:Regular   omeprazole (PRILOSEC) 20 MG capsule Take 20 mg by mouth in the morning and at bedtime.    polyethylene glycol (MIRALAX / GLYCOLAX) 17 g packet Take 17 g by mouth daily as needed.   pramipexole (MIRAPEX) 0.5 MG tablet Take 0.5 mg by mouth at bedtime.   QUEtiapine (SEROQUEL) 25 MG tablet Take 25 mg by mouth daily.   traZODone (DESYREL) 50 MG tablet Take by mouth at bedtime.   No facility-administered encounter medications on file as of 12/09/2021.     SIGNIFICANT DIAGNOSTIC EXAMS       ASSESSMENT/ PLAN:     Ok Edwards NP [facility]  Contact [phone] Monday through Friday 8am- 5pm  After hours call [phone]

## 2021-12-12 ENCOUNTER — Other Ambulatory Visit: Payer: Self-pay | Admitting: Adult Health

## 2021-12-12 MED ORDER — ALPRAZOLAM 0.25 MG PO TABS: 0.2500 mg | ORAL_TABLET | Freq: Two times a day (BID) | ORAL | 0 refills | Status: DC

## 2021-12-26 ENCOUNTER — Encounter: Payer: Self-pay | Admitting: Internal Medicine

## 2021-12-26 ENCOUNTER — Non-Acute Institutional Stay (SKILLED_NURSING_FACILITY): Payer: Medicare Other | Admitting: Internal Medicine

## 2021-12-26 DIAGNOSIS — E782 Mixed hyperlipidemia: Secondary | ICD-10-CM | POA: Diagnosis not present

## 2021-12-26 DIAGNOSIS — I10 Essential (primary) hypertension: Secondary | ICD-10-CM | POA: Diagnosis not present

## 2021-12-26 DIAGNOSIS — G20 Parkinson's disease: Secondary | ICD-10-CM | POA: Diagnosis not present

## 2021-12-26 DIAGNOSIS — F02B2 Dementia in other diseases classified elsewhere, moderate, with psychotic disturbance: Secondary | ICD-10-CM

## 2021-12-26 NOTE — Patient Instructions (Signed)
See assessment and plan under each diagnosis in the problem list and acutely for this visit 

## 2021-12-26 NOTE — Progress Notes (Signed)
   NURSING HOME LOCATION:  [facility] ROOM NUMBER:  [place]:  Full Code  PCP:  Ok Edwards NP  This is a nursing facility follow up visit of chronic medical diagnoses & to document compliance with Regulation 483.30 (c) in [employer] [place] Manual Phase 2 which mandates caregiver visit ( visits can alternate among physician, PA or NP as per statutes) within 10 days of 30 days / 60 days/ 90 days post admission to SNF date    Interim medical record and care since last SNF visit was updated with review of diagnostic studies and change in clinical status since last visit were documented.  HPI: She is a permanent resident of facility with medical diagnoses of GERD, dyslipidemia, essential hypertension, Parkinson's disease complicated by dementia, sleep apnea, history of TIA and stroke, and vitamin D deficiency. Significant procedures and surgeries include deep brain stimulator placement and esophageal dilation.  LDL is @ goal with value of 66.  Review of systems could not be completed due to profound aphasia.  She exhibited hyponasal sounds without discernible words.  Staff states she seems to complain of pain in her feet but she seemed a indicate pain in the neck or ear and she pointed to the left ear.  Physical exam:  Pertinent or positive findings: She exhibits masked facies suggesting tear.  Eyes are wide and mouth agape. That facial expression persists. The mouth does sag to the right.  She has only a few remaining upper and lower teeth.  First and second heart sounds are accentuated.  Breath sounds are decreased.  Bowel sounds were actually heard up in the chest.  Pedal pulses are decreased.  She has splotchy hyperpigmentation over the shins.  There is vitiliginous scarring of the left wrist.  There is minor bruising over the forearms.  She exhibits contractures of the toes.  Left lower extremity is stronger to opposition than the right.  Upper extremities  are weak but equal clinically.  She exhibits a coarse rotatory tremor of the hands, much greater on the left.  General appearance: no acute distress, increased work of breathing is present.   Lymphatic: No lymphadenopathy about the head, neck, axilla. Eyes: No conjunctival inflammation or lid edema is present. There is no scleral icterus. Ears:  External ear exam shows no significant lesions or deformities.   Nose:  External nasal examination shows no deformity or inflammation. Nasal mucosa are pink and moist without lesions, exudates Neck:  No thyromegaly, masses, tenderness noted.    Heart:  Normal rate and regular rhythm without gallop, murmur, click, rub .  Lungs:  without wheezes, rhonchi, rales, rubs. Abdomen: Bowel sounds are normal. Abdomen is soft and nontender with no organomegaly, hernias, masses. GU: Deferred  Extremities:  No cyanosis, clubbing, edema  Neurologic exam :Balance, Rhomberg, finger to nose testing could not be completed due to clinical state Skin: Warm & dry w/o tenting.  See summary under each active problem in the Problem List with associated updated therapeutic plan

## 2021-12-26 NOTE — Assessment & Plan Note (Addendum)
LDL is current and at goal of < 70 due to [place] of TIA  with a value of 66 without statin.

## 2021-12-26 NOTE — Assessment & Plan Note (Addendum)
Coarse tremor chiefly in the left upper extremity is unchanged.  ROS cannot be completed due to aphasic state with indiscernible vocalization.  NP reports that she has been noncompliant with Speech Therapy recommendations concerning vocal exercises, resulting in aphasia and inability to communicate verbally.

## 2021-12-26 NOTE — Assessment & Plan Note (Signed)
BP controlled; no change in high dose ARB antihypertensive.

## 2022-01-10 ENCOUNTER — Other Ambulatory Visit: Payer: Self-pay | Admitting: Adult Health

## 2022-01-10 ENCOUNTER — Encounter: Payer: Self-pay | Admitting: Adult Health

## 2022-01-10 ENCOUNTER — Non-Acute Institutional Stay (SKILLED_NURSING_FACILITY): Payer: Medicare Other | Admitting: Adult Health

## 2022-01-10 DIAGNOSIS — F02B2 Dementia in other diseases classified elsewhere, moderate, with psychotic disturbance: Secondary | ICD-10-CM

## 2022-01-10 DIAGNOSIS — G20 Parkinson's disease: Secondary | ICD-10-CM

## 2022-01-10 DIAGNOSIS — J449 Chronic obstructive pulmonary disease, unspecified: Secondary | ICD-10-CM

## 2022-01-10 MED ORDER — ALPRAZOLAM 0.25 MG PO TABS: 0.2500 mg | ORAL_TABLET | Freq: Two times a day (BID) | ORAL | 0 refills | Status: DC

## 2022-01-10 NOTE — Progress Notes (Signed)
Location:  [place] Room Number: 108-W Place of Service:  SNF (31)   CODE STATUS: DNR  Allergies  Allergen Reactions   Oxycodone Other (See Comments)    Can tolerate low dose mixed with tynlelol HALLUCINATIONS    Buspar [Buspirone] Other (See Comments)    Unknown   Docosahexaenoic Acid Other (See Comments)   Eicosapentaenoic Acid    Eicosapentaenoic Acid (Epa)    Lutein Nausea And Vomiting   Omega-3 Fatty Acids Other (See Comments)   Other    Aspirin Nausea And Vomiting and Other (See Comments)    Severe stomach pain    Codeine Nausea Only    Makes her feel very sick     Chief Complaint  Patient presents with   Acute Visit    Care plan meeting    HPI:  We have come together for her care plan meeting. Guardian not present.  BIMS 14/15 mood 9/30: not sleeping well; decreased appetite; nervous; decreased energy; some depression. She is nonambulatory has had no falls. She is extensive to dependent assistance with her adls. She is incontinent of bladder and bowel. Dietary: has poor appetite is on supplements; feeds self weight is 144 pounds  D3 with ground meats .  Therapy: none at this time . Activities: passively participates in activities; is spending more time in bed .  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  Past Medical History:  Diagnosis Date   Anxiety    Arthritis    Complication of anesthesia    Depression    Depression with suicidal ideation 01/21/2021   Dysphasia    GERD (gastroesophageal reflux disease)    Hyperlipidemia    Hypertension    Parkinson's disease ([place])    diagnosed at age 76   Parkinson's disease dementia ([place]) 07/31/2020   PONV (postoperative nausea and vomiting)    states it has been a long time since getting sick   Sleep apnea    Has CPAP but doesnt use   Stroke ([place])     TIA (transient ischemic attack)    not really TIA but abnormal MRI per pt left sided weakness   UTI (lower urinary tract infection)    Vitamin D deficiency     Past Surgical History:  Procedure Laterality Date   BACK SURGERY     Battery change     DBS, 12/2009   BIOPSY N/A 11/29/2013   Procedure: GASTRIC BIOPSY;  Surgeon: Danie Binder, MD;  Location: [facility];  Service: Endoscopy;  Laterality: N/A;   BURR HOLE W/ STEREOTACTIC INSERTION OF DBS LEADS / INTRAOP MICROELECTRODE RECORDING     2007   CARPAL TUNNEL RELEASE Right    COLONOSCOPY WITH PROPOFOL N/A 11/29/2013   Procedure: COLONOSCOPY WITH PROPOFOL;  Surgeon: Danie Binder, MD;  Location: [facility];  Service: Endoscopy;  Laterality: N/A;  entered cecum @ [phone]; total cecal withdrawal time 21 minutes    ELBOW SURGERY Left    after fx   ESOPHAGEAL DILATION N/A 11/29/2013   Procedure: ESOPHAGEAL DILATION;  Surgeon: Danie Binder, MD;  Location: [facility];  Service: Endoscopy;  Laterality: N/A;  [facility] 14/15/16   ESOPHAGOGASTRODUODENOSCOPY (EGD) WITH PROPOFOL N/A 11/29/2013   Procedure: ESOPHAGOGASTRODUODENOSCOPY (EGD) WITH PROPOFOL;  Surgeon: Danie Binder, MD;  Location: [facility];  Service: Endoscopy;  Laterality: N/A;   FOOT SURGERY Left    "  something with 2nd 2."   NECK SURGERY     Fusion   POLYPECTOMY N/A 11/29/2013   Procedure: POLYPECTOMY;  Surgeon: Danie Binder, MD;  Location: [facility];  Service: Endoscopy;  Laterality: N/A;  Distal Transverse Colon x2    PR ANALYZE NEUROSTIM BRAIN, FIRST 1H  10/15/2012       PULSE GENERATOR IMPLANT Right 03/30/2018   Procedure: Right Implantable pulse generator change;  Surgeon: Erline Levine, MD;  Location: [facility];  Service: Neurosurgery;  Laterality: Right;   SUBTHALAMIC STIMULATOR BATTERY REPLACEMENT N/A 08/05/2013   Procedure: SUBTHALAMIC STIMULATOR BATTERY REPLACEMENT;  Surgeon: Erline Levine, MD;  Location: [facility];  Service: Neurosurgery;  Laterality: N/A;   SUBTHALAMIC STIMULATOR BATTERY  REPLACEMENT N/A 11/26/2015   Procedure: Implantable pulse generator battery change for deep brain stimulator;  Surgeon: Erline Levine, MD;  Location: [facility];  Service: Neurosurgery;  Laterality: N/A;   SUBTHALAMIC STIMULATOR BATTERY REPLACEMENT Right 09/13/2020   Procedure: Right chest implantable pulse generator change for depleted battery;  Surgeon: Erline Levine, MD;  Location: [facility];  Service: Neurosurgery;  Laterality: Right;  3C/RM 18   VAGINAL HYSTERECTOMY     "partial"   WRIST SURGERY Right    after fx    Social History   Socioeconomic History   Marital status: Widowed    Spouse name: Not on file   Number of children: Not on file   Years of education: Not on file   Highest education level: Not on file  Occupational History   Occupation: Retired  Tobacco Use   Smoking status: Former    Packs/day: 0.50    Years: 25.00    Total pack years: 12.50    Types: Cigarettes    Quit date: 03/01/2019    Years since quitting: 2.8   Smokeless tobacco: Never  Vaping Use   Vaping Use: Never used  Substance and Sexual Activity   Alcohol use: Not Currently   Drug use: No   Sexual activity: Not on file  Other Topics Concern   Not on file  Social History Narrative   She resides at [facility] (assisted living facility).   Social Determinants of Health   Financial Resource Strain: Low Risk  (03/29/2019)   Overall Financial Resource Strain (CARDIA)    Difficulty of Paying Living Expenses: Not very hard  Food Insecurity: Not on file  Transportation Needs: No Transportation Needs (03/29/2019)   PRAPARE - Hydrologist (Medical): No    Lack of Transportation (Non-Medical): No  Physical Activity: Not on file  Stress: Not on file  Social Connections: Moderately Isolated (03/29/2019)   Social Connection and Isolation Panel [NHANES]    Frequency of Communication with Friends and Family: More than three times a week    Frequency of Social Gatherings with  Friends and Family: Once a week    Attends Religious Services: 1 to 4 times per year    Active Member of [employer] or Organizations: No    Attends Archivist Meetings: Never    Marital Status: Widowed  Intimate Partner Violence: Unknown (03/29/2019)   Humiliation, Afraid, Rape, and Kick questionnaire    Fear of Current or Ex-Partner: No    Emotionally Abused: Not on file    Physically Abused: Not on file    Sexually Abused: Not on file   Family History  Problem Relation Age of Onset   Colon cancer Paternal Uncle       VITAL SIGNS  BP 118/70   Pulse 64   Temp 98.1 F (36.7 C)   Resp 20   Ht '5\' 2"'$  (1.575 m)   Wt 144 lb (65.3 kg)   SpO2 94%   BMI 26.34 kg/m   Outpatient Encounter Medications as of 01/10/2022  Medication Sig   acetaminophen (TYLENOL) 500 MG tablet Take 500 mg by mouth every 8 (eight) hours.   albuterol (VENTOLIN HFA) 108 (90 Base) MCG/ACT inhaler Inhale 2 puffs into the lungs every 4 (four) hours as needed for wheezing or shortness of breath.   ALPRAZolam (XANAX) 0.25 MG tablet Take 1 tablet (0.25 mg total) by mouth 2 (two) times daily. Major depressive disorder, recurrent, unspecified   amLODipine (NORVASC) 5 MG tablet Take 5 mg by mouth daily.   BREZTRI AEROSPHERE 160-9-4.8 MCG/ACT AERO Inhale 2 puffs into the lungs 2 (two) times daily.   Camphor-Menthol-Methyl Sal (SALONPAS) 3.05-31-08 % [facility] Special Instructions: apply one pack to both sides of lower back daily and remove and at HS. for bilateral lower back pain. [DX: Chronic pain syndrome-per NP note]   carbidopa-levodopa (SINEMET IR) 25-100 MG tablet Take 1 tablet by mouth 2 (two) times daily.   Cholecalciferol 50 MCG (2000 UT) CAPS Take 1 capsule by mouth daily.   clopidogrel (PLAVIX) 75 MG tablet Take 75 mg by mouth daily.   Dextromethorphan-quiNIDine (NUEDEXTA) 20-10 MG capsule Take 1 capsule by mouth 2 (two) times daily.   docusate sodium (COLACE) 100 MG capsule Take 100 mg by mouth daily.    donepezil (ARICEPT) 5 MG tablet Take 5 mg by mouth at bedtime. 9 pm for parkinson's dementia   DULoxetine (CYMBALTA) 60 MG capsule Take 60 mg by mouth daily. 9 am   Ensure (ENSURE) Take 237 mLs by mouth 2 (two) times daily between meals. 9 am and 9 pm   guaiFENesin (MUCINEX) 600 MG 12 hr tablet Take 600 mg by mouth daily as needed.   losartan (COZAAR) 100 MG tablet Take 100 mg by mouth daily.   meloxicam (MOBIC) 15 MG tablet Take 15 mg by mouth daily.   NON FORMULARY Diet: : Dys 3 (chopped) diet, continue thin liquids; will allow pizza if requested  Liquids:Regular   omeprazole (PRILOSEC) 20 MG capsule Take 20 mg by mouth in the morning and at bedtime.    polyethylene glycol (MIRALAX / GLYCOLAX) 17 g packet Take 17 g by mouth daily as needed.   pramipexole (MIRAPEX) 0.5 MG tablet Take 0.5 mg by mouth at bedtime.   QUEtiapine (SEROQUEL) 25 MG tablet Take 12.5 mg by mouth daily.   traZODone (DESYREL) 50 MG tablet Take by mouth at bedtime.   hydrocortisone 1 % lotion Special Instructions: left upper arm Every Shift - PRN   miconazole (ZEASORB-AF) 2 % powder Apply 1 application topically as needed for itching (Every Shift - PRN; PRN 1, PRN 2, PRN 3).   No facility-administered encounter medications on file as of 01/10/2022.     SIGNIFICANT DIAGNOSTIC EXAMS  LABS REVIEWED; PREVIOUS    01-17-21: wbc 7.1; hgb 14.8; hct 43.5; mcv 93.8 plt 239 01-21-21: wbc 7.7; hgb 15.9; hct 46.8; mcv 92.3 plt 260; glucose 117; bun 13; creat 0.80; k+ 3.3; na++ 138; ca 9.2; GFR>60 liver normal albumin 4.2  01-24-21: glucose 107; bun 23; creat 1.01; k+ 3.4; na++ 137; ca 8.8; GFR >58  02-14-21: glucose 137; bun 19; creat 0.77; k+ 3.7; na++ 137; ca 8.9; GFR>60 04-11-21: hgb a1c 5.5; tsh 1.431 free t4: 1.12 05-30-21: glucose 106;  bun 26; creat 0.95; k+ 4.0; na++ 138; ca 9.6; GFR>60 07-12-21: wbc 7.5; hgb 15.5; hct 46.0; mcv 97.5 plt 252; glucose 112; bun 31; creat 0.98; k+ 4.0; na++ 138; ca 9.5 GFR>60   TODAY  11-07-21:  chol 134; ldl 66; trig 173 hdl 33; hepatitis C: nr   Review of Systems  Reason unable to perform ROS: unable to fully participate.   Physical Exam Constitutional:      General: She is not in acute distress.    Appearance: She is well-developed. She is not diaphoretic.  Neck:     Thyroid: No thyromegaly.  Cardiovascular:     Rate and Rhythm: Normal rate and regular rhythm.     Pulses: Normal pulses.     Heart sounds: Normal heart sounds.  Pulmonary:     Effort: Pulmonary effort is normal. No respiratory distress.     Breath sounds: Normal breath sounds.  Abdominal:     General: Bowel sounds are normal. There is no distension.     Palpations: Abdomen is soft.     Tenderness: There is no abdominal tenderness.  Musculoskeletal:     Cervical back: Neck supple.     Right lower leg: No edema.     Left lower leg: No edema.     Comments:  Has left side weakness Leans head to the left  Bilateral hand tremor left >right  Has bilateral eyebrow movement Right arm shrug    Lymphadenopathy:     Cervical: No cervical adenopathy.  Skin:    General: Skin is warm and dry.  Neurological:     Mental Status: She is alert. Mental status is at baseline.  Psychiatric:        Mood and Affect: Mood normal.       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

## 2022-01-24 ENCOUNTER — Other Ambulatory Visit: Payer: Self-pay | Admitting: Adult Health

## 2022-01-24 MED ORDER — ALPRAZOLAM 0.25 MG PO TABS: 0.2500 mg | ORAL_TABLET | Freq: Two times a day (BID) | ORAL | 0 refills | Status: DC

## 2022-01-28 ENCOUNTER — Non-Acute Institutional Stay (SKILLED_NURSING_FACILITY): Payer: Medicare Other | Admitting: Adult Health

## 2022-01-28 ENCOUNTER — Encounter: Payer: Self-pay | Admitting: Adult Health

## 2022-01-28 DIAGNOSIS — F02B2 Dementia in other diseases classified elsewhere, moderate, with psychotic disturbance: Secondary | ICD-10-CM | POA: Diagnosis not present

## 2022-01-28 DIAGNOSIS — I10 Essential (primary) hypertension: Secondary | ICD-10-CM

## 2022-01-28 DIAGNOSIS — G20 Parkinson's disease: Secondary | ICD-10-CM

## 2022-01-28 DIAGNOSIS — G894 Chronic pain syndrome: Secondary | ICD-10-CM | POA: Diagnosis not present

## 2022-01-28 NOTE — Progress Notes (Signed)
Location:  [place] Room Number: 108 Place of Service:  SNF (31)   CODE STATUS: dnr   Allergies  Allergen Reactions   Oxycodone Other (See Comments)    Can tolerate low dose mixed with tynlelol HALLUCINATIONS    Buspar [Buspirone] Other (See Comments)    Unknown   Docosahexaenoic Acid Other (See Comments)   Eicosapentaenoic Acid    Eicosapentaenoic Acid (Epa)    Lutein Nausea And Vomiting   Omega-3 Fatty Acids Other (See Comments)   Other    Aspirin Nausea And Vomiting and Other (See Comments)    Severe stomach pain    Codeine Nausea Only    Makes her feel very sick     Chief Complaint  Patient presents with   Medical Management of Chronic Issues                Chronic pain syndrome:  Parkinson's disease related dementia:  Essential hypertension:  Parkinson's disease     HPI:  She is a 76 year old long term resident of this facility being seen for the management of her chronic illnesses:  Chronic pain syndrome:  Parkinson's disease related dementia:  Essential hypertension:  Parkinson's disease. There are no reports of uncontrolled pain; no changes in appetite; weight is stable. Her ability to communicate is declining.   Past Medical History:  Diagnosis Date   Anxiety    Arthritis    Complication of anesthesia    Depression    Depression with suicidal ideation 01/21/2021   Dysphasia    GERD (gastroesophageal reflux disease)    Hyperlipidemia    Hypertension    Parkinson's disease ([place])    diagnosed at age 76   Parkinson's disease dementia ([place]) 07/31/2020   PONV (postoperative nausea and vomiting)    states it has been a long time since getting sick   Sleep apnea    Has CPAP but doesnt use   Stroke [facility])    TIA (transient ischemic attack)    not really TIA but abnormal MRI per pt left sided weakness   UTI (lower urinary tract infection)    Vitamin D deficiency     Past Surgical History:  Procedure Laterality Date   BACK SURGERY      Battery change     DBS, 12/2009   BIOPSY N/A 11/29/2013   Procedure: GASTRIC BIOPSY;  Surgeon: Danie Binder, MD;  Location: [facility];  Service: Endoscopy;  Laterality: N/A;   BURR HOLE W/ STEREOTACTIC INSERTION OF DBS LEADS / INTRAOP MICROELECTRODE RECORDING     2007   CARPAL TUNNEL RELEASE Right    COLONOSCOPY WITH PROPOFOL N/A 11/29/2013   Procedure: COLONOSCOPY WITH PROPOFOL;  Surgeon: Danie Binder, MD;  Location: [facility];  Service: Endoscopy;  Laterality: N/A;  entered cecum @ [phone]; total cecal withdrawal time 21 minutes    ELBOW SURGERY Left    after fx   ESOPHAGEAL DILATION N/A 11/29/2013   Procedure: ESOPHAGEAL DILATION;  Surgeon: Danie Binder, MD;  Location: [facility];  Service: Endoscopy;  Laterality: N/A;  [facility] 14/15/16   ESOPHAGOGASTRODUODENOSCOPY (EGD) WITH PROPOFOL N/A 11/29/2013   Procedure: ESOPHAGOGASTRODUODENOSCOPY (EGD) WITH PROPOFOL;  Surgeon: Danie Binder, MD;  Location: [facility];  Service: Endoscopy;  Laterality: N/A;   FOOT SURGERY Left    "something with 2nd 2."   NECK SURGERY     Fusion   POLYPECTOMY N/A 11/29/2013   Procedure: POLYPECTOMY;  Surgeon: Danie Binder, MD;  Location: [facility];  Service: Endoscopy;  Laterality: N/A;  Distal Transverse Colon x2    PR ANALYZE NEUROSTIM BRAIN, FIRST 1H  10/15/2012       PULSE GENERATOR IMPLANT Right 03/30/2018   Procedure: Right Implantable pulse generator change;  Surgeon: Erline Levine, MD;  Location: [place];  Service: Neurosurgery;  Laterality: Right;   SUBTHALAMIC STIMULATOR BATTERY REPLACEMENT N/A 08/05/2013   Procedure: SUBTHALAMIC STIMULATOR BATTERY REPLACEMENT;  Surgeon: Erline Levine, MD;  Location: [place] [facility];  Service: Neurosurgery;  Laterality: N/A;   SUBTHALAMIC STIMULATOR BATTERY REPLACEMENT N/A 11/26/2015   Procedure: Implantable pulse generator battery change for deep brain stimulator;  Surgeon: Erline Levine, MD;  Location: [place] [facility];  Service: Neurosurgery;  Laterality: N/A;   SUBTHALAMIC STIMULATOR BATTERY  REPLACEMENT Right 09/13/2020   Procedure: Right chest implantable pulse generator change for depleted battery;  Surgeon: Erline Levine, MD;  Location: [facility];  Service: Neurosurgery;  Laterality: Right;  3C/RM 18   VAGINAL HYSTERECTOMY     "partial"   WRIST SURGERY Right    after fx    Social History   Socioeconomic History   Marital status: Widowed    Spouse name: Not on file   Number of children: Not on file   Years of education: Not on file   Highest education level: Not on file  Occupational History   Occupation: Retired  Tobacco Use   Smoking status: Former    Packs/day: 0.50    Years: 25.00    Total pack years: 12.50    Types: Cigarettes    Quit date: 03/01/2019    Years since quitting: 2.9   Smokeless tobacco: Never  Vaping Use   Vaping Use: Never used  Substance and Sexual Activity   Alcohol use: Not Currently   Drug use: No   Sexual activity: Not on file  Other Topics Concern   Not on file  Social History Narrative   She resides at [facility] (assisted living facility).   Social Determinants of Health   Financial Resource Strain: Low Risk  (03/29/2019)   Overall Financial Resource Strain (CARDIA)    Difficulty of Paying Living Expenses: Not very hard  Food Insecurity: Not on file  Transportation Needs: No Transportation Needs (03/29/2019)   PRAPARE - Hydrologist (Medical): No    Lack of Transportation (Non-Medical): No  Physical Activity: Not on file  Stress: Not on file  Social Connections: Moderately Isolated (03/29/2019)   Social Connection and Isolation Panel [NHANES]    Frequency of Communication with Friends and Family: More than three times a week    Frequency of Social Gatherings with Friends and Family: Once a week    Attends Religious Services: 1 to 4 times per year    Active Member of [employer] or Organizations: No    Attends Archivist Meetings: Never    Marital Status: Widowed  Intimate Partner Violence:  Unknown (03/29/2019)   Humiliation, Afraid, Rape, and Kick questionnaire    Fear of Current or Ex-Partner: No    Emotionally Abused: Not on file    Physically Abused: Not on file    Sexually Abused: Not on file   Family History  Problem Relation Age of Onset   Colon cancer Paternal Uncle       VITAL SIGNS BP 114/66   Pulse 78   Temp 98.6 F (37 C)   Resp 20   Ht '5\' 2"'$  (1.575 m)   Wt 144 lb (  65.3 kg)   SpO2 98%   BMI 26.34 kg/m   Outpatient Encounter Medications as of 01/28/2022  Medication Sig   acetaminophen (TYLENOL) 500 MG tablet Take 500 mg by mouth every 8 (eight) hours.   albuterol (VENTOLIN HFA) 108 (90 Base) MCG/ACT inhaler Inhale 2 puffs into the lungs every 4 (four) hours as needed for wheezing or shortness of breath.   ALPRAZolam (XANAX) 0.25 MG tablet Take 1 tablet (0.25 mg total) by mouth 2 (two) times daily. Major depressive disorder, recurrent, unspecified   amLODipine (NORVASC) 5 MG tablet Take 5 mg by mouth daily.   BREZTRI AEROSPHERE 160-9-4.8 MCG/ACT AERO Inhale 2 puffs into the lungs 2 (two) times daily.   Camphor-Menthol-Methyl Sal (SALONPAS) 3.05-31-08 % [facility] Special Instructions: apply one pack to both sides of lower back daily and remove and at HS. for bilateral lower back pain. [DX: Chronic pain syndrome-per NP note]   carbidopa-levodopa (SINEMET IR) 25-100 MG tablet Take 1 tablet by mouth 2 (two) times daily.   Cholecalciferol 50 MCG (2000 UT) CAPS Take 1 capsule by mouth daily.   clopidogrel (PLAVIX) 75 MG tablet Take 75 mg by mouth daily.   Dextromethorphan-quiNIDine (NUEDEXTA) 20-10 MG capsule Take 1 capsule by mouth 2 (two) times daily.   docusate sodium (COLACE) 100 MG capsule Take 100 mg by mouth daily.   donepezil (ARICEPT) 5 MG tablet Take 5 mg by mouth at bedtime. 9 pm for parkinson's dementia   DULoxetine (CYMBALTA) 60 MG capsule Take 60 mg by mouth daily. 9 am   Ensure (ENSURE) Take 237 mLs by mouth 2 (two) times daily between meals. 9 am and  9 pm   guaiFENesin (MUCINEX) 600 MG 12 hr tablet Take 600 mg by mouth daily as needed.   hydrocortisone 1 % lotion Special Instructions: left upper arm Every Shift - PRN   losartan (COZAAR) 100 MG tablet Take 100 mg by mouth daily.   meloxicam (MOBIC) 15 MG tablet Take 15 mg by mouth daily.   miconazole (ZEASORB-AF) 2 % powder Apply 1 application topically as needed for itching (Every Shift - PRN; PRN 1, PRN 2, PRN 3).   NON FORMULARY Diet: : Dys 3 (chopped) diet, continue thin liquids; will allow pizza if requested  Liquids:Regular   omeprazole (PRILOSEC) 20 MG capsule Take 20 mg by mouth in the morning and at bedtime.    polyethylene glycol (MIRALAX / GLYCOLAX) 17 g packet Take 17 g by mouth daily as needed.   pramipexole (MIRAPEX) 0.5 MG tablet Take 0.5 mg by mouth at bedtime.   QUEtiapine (SEROQUEL) 25 MG tablet Take 12.5 mg by mouth daily.   traZODone (DESYREL) 50 MG tablet Take by mouth at bedtime.   No facility-administered encounter medications on file as of 01/28/2022.     SIGNIFICANT DIAGNOSTIC EXAMS   LABS REVIEWED; PREVIOUS    01-24-21: glucose 107; bun 23; creat 1.01; k+ 3.4; na++ 137; ca 8.8; GFR >58  02-14-21: glucose 137; bun 19; creat 0.77; k+ 3.7; na++ 137; ca 8.9; GFR>60 04-11-21: hgb a1c 5.5; tsh 1.431 free t4: 1.12 05-30-21: glucose 106; bun 26; creat 0.95; k+ 4.0; na++ 138; ca 9.6; GFR>60 07-12-21: wbc 7.5; hgb 15.5; hct 46.0; mcv 97.5 plt 252; glucose 112; bun 31; creat 0.98; k+ 4.0; na++ 138; ca 9.5 GFR>60  11-07-21: chol 134; ldl 66; trig 173 hdl 33; hepatitis C: nr   NO NEW LABS.   Review of Systems  Reason unable to perform ROS: unable to fully participate.  Physical Exam Constitutional:      General: She is not in acute distress.    Appearance: She is well-developed. She is not diaphoretic.  Neck:     Thyroid: No thyromegaly.  Cardiovascular:     Rate and Rhythm: Normal rate and regular rhythm.     Pulses: Normal pulses.     Heart sounds: Normal heart  sounds.  Pulmonary:     Effort: Pulmonary effort is normal. No respiratory distress.     Breath sounds: Normal breath sounds.  Abdominal:     General: Bowel sounds are normal. There is no distension.     Palpations: Abdomen is soft.     Tenderness: There is no abdominal tenderness.  Musculoskeletal:     Cervical back: Neck supple.     Right lower leg: No edema.     Left lower leg: No edema.     Comments: Has left side weakness Leans head to the left  Bilateral hand tremor  Has bilateral eyebrow movement Right arm shrug     Lymphadenopathy:     Cervical: No cervical adenopathy.  Skin:    General: Skin is warm and dry.  Neurological:     Mental Status: She is alert. Mental status is at baseline.  Psychiatric:        Mood and Affect: Mood normal.     ASSESSMENT/ PLAN:  TODAY  Chronic pain syndrome: will continue cymbalta 60 mg daily (also takes for mood state) salon patches to back X 2.   2. Parkinson's disease related dementia: weight is 144 pounds; is on aricept 5 mg daily   3. Essential hypertension: b/p 114/66 will continue norvac 5 mg daily and cozaar 100 mg daily   4. Parkinson's disease is stable is status post deep brain stimulator: will continue sinemet IR 25/100 mg twice daily and mirapex 0.5 mg nightly   PREVIOUS   5. OAB (over active bladder) is completely incontinent off ditropan   6. Mixed hyperlipidemia: ldl 66; will continue lipitor 40 mg;   7. PBA is stable will continue nuedexta 10/20 mg twice daily   8. 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 COPD with exacerbation: will continue breztri aerosphere 160-4.5 mcg 2 puffs daily   10.  GERD without esophagitis will continue prilosec 20 mg daily   11. Chronic constipation: will continue colace  daily and miralax daily as needed   Will check cbc; cmp; tsh hgb a1c   Ok Edwards NP [facility]  call [phone]

## 2022-01-30 ENCOUNTER — Encounter: Payer: Self-pay | Admitting: Adult Health

## 2022-01-30 ENCOUNTER — Non-Acute Institutional Stay (SKILLED_NURSING_FACILITY): Payer: Medicare Other | Admitting: Adult Health

## 2022-01-30 ENCOUNTER — Other Ambulatory Visit (HOSPITAL_COMMUNITY)
Admission: RE | Admit: 2022-01-30 | Discharge: 2022-01-30 | Disposition: A | Discharge status: Home / Self Care | Payer: Medicare Other | Source: Skilled Nursing Facility | Attending: Adult Health | Admitting: Adult Health

## 2022-01-30 DIAGNOSIS — F02C2 Dementia in other diseases classified elsewhere, severe, with psychotic disturbance: Secondary | ICD-10-CM | POA: Diagnosis not present

## 2022-01-30 DIAGNOSIS — G20 Parkinson's disease: Secondary | ICD-10-CM | POA: Diagnosis not present

## 2022-01-30 DIAGNOSIS — I10 Essential (primary) hypertension: Secondary | ICD-10-CM | POA: Insufficient documentation

## 2022-01-30 DIAGNOSIS — E119 Type 2 diabetes mellitus without complications: Secondary | ICD-10-CM | POA: Insufficient documentation

## 2022-01-30 LAB — COMPREHENSIVE METABOLIC PANEL
ALT: 8 U/L (ref 0–44)
AST: 19 U/L (ref 15–41)
Albumin: 3.6 g/dL (ref 3.5–5.0)
Alkaline Phosphatase: 73 U/L (ref 38–126)
Anion gap: 4 — ABNORMAL LOW (ref 5–15)
BUN: 21 mg/dL (ref 8–23)
CO2: 30 mmol/L (ref 22–32)
Calcium: 9.3 mg/dL (ref 8.9–10.3)
Chloride: 106 mmol/L (ref 98–111)
Creatinine, Ser: 0.76 mg/dL (ref 0.44–1.00)
GFR, Estimated: >60 mL/min (ref >60)
Glucose, Bld: 102 mg/dL — ABNORMAL HIGH (ref 70–99)
Potassium: 4 mmol/L (ref 3.5–5.1)
Sodium: 140 mmol/L (ref 135–145)
Total Bilirubin: 0.8 mg/dL (ref 0.3–1.2)
Total Protein: 6.5 g/dL (ref 6.5–8.1)

## 2022-01-30 LAB — HEMOGLOBIN A1C
Hgb A1c MFr Bld: 5.2 % (ref 4.8–5.6)
Mean Plasma Glucose: 102.54 mg/dL

## 2022-01-30 LAB — TSH: TSH: 1.695 u[IU]/mL (ref 0.350–4.500)

## 2022-01-30 LAB — CBC
HCT: 42.1 % (ref 36.0–46.0)
Hemoglobin: 14.5 g/dL (ref 12.0–15.0)
MCH: 33.2 pg (ref 26.0–34.0)
MCHC: 34.4 g/dL (ref 30.0–36.0)
MCV: 96.3 fL (ref 80.0–100.0)
Platelets: 190 10*3/uL (ref 150–400)
RBC: 4.37 MIL/uL (ref 3.87–5.11)
RDW: 12.2 % (ref 11.5–15.5)
WBC: 6 10*3/uL (ref 4.0–10.5)
nRBC: 0 % (ref 0.0–0.2)

## 2022-01-30 NOTE — Progress Notes (Signed)
Location:  [place] Room Number: 108 Place of Service:  SNF (31)   CODE STATUS: dnr   Allergies  Allergen Reactions   Oxycodone Other (See Comments)    Can tolerate low dose mixed with tynlelol HALLUCINATIONS    Buspar [Buspirone] Other (See Comments)    Unknown   Docosahexaenoic Acid Other (See Comments)   Eicosapentaenoic Acid    Eicosapentaenoic Acid (Epa)    Lutein Nausea And Vomiting   Omega-3 Fatty Acids Other (See Comments)   Other    Aspirin Nausea And Vomiting and Other (See Comments)    Severe stomach pain    Codeine Nausea Only    Makes her feel very sick     Chief Complaint  Patient presents with   Acute Visit    Weight loss     HPI:  She has been losing weight. 05-30-21: 157 pounds; 09-23-21: 146.8 pounds; 11-22-21: weight 144 pounds; 01-29-22: 139.6 pounds.  Her appetite is variable; she is able to feed herself. She does spend all of her time in her room. Her medical history includes parkinson's disease and parkinson's dementia. Weight loss can be an expected outcome in the later stages of these disease processes.   Past Medical History:  Diagnosis Date   Anxiety    Arthritis    Complication of anesthesia    Depression    Depression with suicidal ideation 01/21/2021   Dysphasia    GERD (gastroesophageal reflux disease)    Hyperlipidemia    Hypertension    Parkinson's disease ([place])    diagnosed at age 43   Parkinson's disease dementia ([place]) 07/31/2020   PONV (postoperative nausea and vomiting)    states it has been a long time since getting sick   Sleep apnea    Has CPAP but doesnt use   Stroke [facility])    TIA (transient ischemic attack)    not really TIA but abnormal MRI per pt left sided weakness   UTI (lower urinary tract infection)    Vitamin D deficiency     Past Surgical History:  Procedure Laterality Date   BACK SURGERY     Battery change     DBS, 12/2009   BIOPSY N/A 11/29/2013   Procedure: GASTRIC BIOPSY;  Surgeon:  Danie Binder, MD;  Location: [facility];  Service: Endoscopy;  Laterality: N/A;   BURR HOLE W/ STEREOTACTIC INSERTION OF DBS LEADS / INTRAOP MICROELECTRODE RECORDING     2007   CARPAL TUNNEL RELEASE Right    COLONOSCOPY WITH PROPOFOL N/A 11/29/2013   Procedure: COLONOSCOPY WITH PROPOFOL;  Surgeon: Danie Binder, MD;  Location: [facility];  Service: Endoscopy;  Laterality: N/A;  entered cecum @ [phone]; total cecal withdrawal time 21 minutes    ELBOW SURGERY Left    after fx   ESOPHAGEAL DILATION N/A 11/29/2013   Procedure: ESOPHAGEAL DILATION;  Surgeon: Danie Binder, MD;  Location: [facility];  Service: Endoscopy;  Laterality: N/A;  [place] 14/15/16   ESOPHAGOGASTRODUODENOSCOPY (EGD) WITH PROPOFOL N/A 11/29/2013   Procedure: ESOPHAGOGASTRODUODENOSCOPY (EGD) WITH PROPOFOL;  Surgeon: Danie Binder, MD;  Location: [facility];  Service: Endoscopy;  Laterality: N/A;   FOOT SURGERY Left    "something with 2nd 2."   NECK SURGERY     Fusion   POLYPECTOMY N/A 11/29/2013   Procedure: POLYPECTOMY;  Surgeon: Danie Binder, MD;  Location: [facility];  Service: Endoscopy;  Laterality: N/A;  Distal Transverse Colon x2    PR ANALYZE  NEUROSTIM BRAIN, FIRST 1H  10/15/2012       PULSE GENERATOR IMPLANT Right 03/30/2018   Procedure: Right Implantable pulse generator change;  Surgeon: Erline Levine, MD;  Location: [place];  Service: Neurosurgery;  Laterality: Right;   SUBTHALAMIC STIMULATOR BATTERY REPLACEMENT N/A 08/05/2013   Procedure: SUBTHALAMIC STIMULATOR BATTERY REPLACEMENT;  Surgeon: Erline Levine, MD;  Location: [place] NEUR[facility] ORS;  Service: Neurosurgery;  Laterality: N/A;   SUBTHALAMIC STIMULATOR BATTERY REPLACEMENT N/A 11/26/2015   Procedure: Implantable pulse generator battery change for deep brain stimulator;  Surgeon: Erline Levine, MD;  Location: [place] NEURO [facility]S;  Service: Neurosurgery;  Laterality: N/A;   SUBTHALAMIC STIMULATOR BATTERY REPLACEMENT Right 09/13/2020   Procedure: Right chest implantable pulse generator change for depleted  battery;  Surgeon: Erline Levine, MD;  Location: [place];  Service: Neurosurgery;  Laterality: Right;  3C/RM 18   VAGINAL HYSTERECTOMY     "partial"   WRIST SURGERY Right    after fx    Social History   Socioeconomic History   Marital status: Widowed    Spouse name: Not on file   Number of children: Not on file   Years of education: Not on file   Highest education level: Not on file  Occupational History   Occupation: Retired  Tobacco Use   Smoking status: Former    Packs/day: 0.50    Years: 25.00    Total pack years: 12.50    Types: Cigarettes    Quit date: 03/01/2019    Years since quitting: 2.9   Smokeless tobacco: Never  Vaping Use   Vaping Use: Never used  Substance and Sexual Activity   Alcohol use: Not Currently   Drug use: No   Sexual activity: Not on file  Other Topics Concern   Not on file  Social History Narrative   She resides at [facility] (assisted living facility).   Social Determinants of Health   Financial Resource Strain: Low Risk  (03/29/2019)   Overall Financial Resource Strain (CARDIA)    Difficulty of Paying Living Expenses: Not very hard  Food Insecurity: Not on file  Transportation Needs: No Transportation Needs (03/29/2019)   PRAPARE - Hydrologist (Medical): No    Lack of Transportation (Non-Medical): No  Physical Activity: Not on file  Stress: Not on file  Social Connections: Moderately Isolated (03/29/2019)   Social Connection and Isolation Panel [NHANES]    Frequency of Communication with Friends and Family: More than three times a week    Frequency of Social Gatherings with Friends and Family: Once a week    Attends Religious Services: 1 to 4 times per year    Active Member of [employer] or Organizations: No    Attends Archivist Meetings: Never    Marital Status: Widowed  Intimate Partner Violence: Unknown (03/29/2019)   Humiliation, Afraid, Rape, and Kick questionnaire    Fear of Current or  Ex-Partner: No    Emotionally Abused: Not on file    Physically Abused: Not on file    Sexually Abused: Not on file   Family History  Problem Relation Age of Onset   Colon cancer Paternal Uncle       VITAL SIGNS BP 114/66   Pulse 78   Temp 98.6 F (37 C)   Resp 20   Ht '5\' 2"'$  (1.575 m)   Wt 139 lb 9.6 oz (63.3 kg)   SpO2 98%   BMI 25.53 kg/m   Outpatient Encounter Medications as  of 01/30/2022  Medication Sig   acetaminophen (TYLENOL) 500 MG tablet Take 500 mg by mouth every 8 (eight) hours.   albuterol (VENTOLIN HFA) 108 (90 Base) MCG/ACT inhaler Inhale 2 puffs into the lungs every 4 (four) hours as needed for wheezing or shortness of breath.   ALPRAZolam (XANAX) 0.25 MG tablet Take 1 tablet (0.25 mg total) by mouth 2 (two) times daily. Major depressive disorder, recurrent, unspecified   amLODipine (NORVASC) 5 MG tablet Take 5 mg by mouth daily.   BREZTRI AEROSPHERE 160-9-4.8 MCG/ACT AERO Inhale 2 puffs into the lungs 2 (two) times daily.   Camphor-Menthol-Methyl Sal (SALONPAS) 3.05-31-08 % [facility] Special Instructions: apply one pack to both sides of lower back daily and remove and at HS. for bilateral lower back pain. [DX: Chronic pain syndrome-per NP note]   carbidopa-levodopa (SINEMET IR) 25-100 MG tablet Take 1 tablet by mouth 2 (two) times daily.   Cholecalciferol 50 MCG (2000 UT) CAPS Take 1 capsule by mouth daily.   clopidogrel (PLAVIX) 75 MG tablet Take 75 mg by mouth daily.   Dextromethorphan-quiNIDine (NUEDEXTA) 20-10 MG capsule Take 1 capsule by mouth 2 (two) times daily.   docusate sodium (COLACE) 100 MG capsule Take 100 mg by mouth daily.   donepezil (ARICEPT) 5 MG tablet Take 5 mg by mouth at bedtime. 9 pm for parkinson's dementia   DULoxetine (CYMBALTA) 60 MG capsule Take 60 mg by mouth daily. 9 am   Ensure (ENSURE) Take 237 mLs by mouth 2 (two) times daily between meals. 9 am and 9 pm   guaiFENesin (MUCINEX) 600 MG 12 hr tablet Take 600 mg by mouth daily as needed.    hydrocortisone 1 % lotion Special Instructions: left upper arm Every Shift - PRN   losartan (COZAAR) 100 MG tablet Take 100 mg by mouth daily.   meloxicam (MOBIC) 15 MG tablet Take 15 mg by mouth daily.   miconazole (ZEASORB-AF) 2 % powder Apply 1 application topically as needed for itching (Every Shift - PRN; PRN 1, PRN 2, PRN 3).   NON FORMULARY Diet: : Dys 3 (chopped) diet, continue thin liquids; will allow pizza if requested  Liquids:Regular   omeprazole (PRILOSEC) 20 MG capsule Take 20 mg by mouth in the morning and at bedtime.    polyethylene glycol (MIRALAX / GLYCOLAX) 17 g packet Take 17 g by mouth daily as needed.   pramipexole (MIRAPEX) 0.5 MG tablet Take 0.5 mg by mouth at bedtime.   QUEtiapine (SEROQUEL) 25 MG tablet Take 12.5 mg by mouth daily.   traZODone (DESYREL) 50 MG tablet Take by mouth at bedtime.   No facility-administered encounter medications on file as of 01/30/2022.     SIGNIFICANT DIAGNOSTIC EXAMS  LABS REVIEWED; PREVIOUS    01-24-21: glucose 107; bun 23; creat 1.01; k+ 3.4; na++ 137; ca 8.8; GFR >58  02-14-21: glucose 137; bun 19; creat 0.77; k+ 3.7; na++ 137; ca 8.9; GFR>60 04-11-21: hgb a1c 5.5; tsh 1.431 free t4: 1.12 05-30-21: glucose 106; bun 26; creat 0.95; k+ 4.0; na++ 138; ca 9.6; GFR>60 07-12-21: wbc 7.5; hgb 15.5; hct 46.0; mcv 97.5 plt 252; glucose 112; bun 31; creat 0.98; k+ 4.0; na++ 138; ca 9.5 GFR>60  11-07-21: chol 134; ldl 66; trig 173 hdl 33; hepatitis C: nr   NO NEW LABS.     Review of Systems  Reason unable to perform ROS: unable to fully participate.    Physical Exam Constitutional:      General: She is not in  acute distress.    Appearance: She is well-developed. She is not diaphoretic.  Neck:     Thyroid: No thyromegaly.  Cardiovascular:     Rate and Rhythm: Normal rate and regular rhythm.     Pulses: Normal pulses.     Heart sounds: Normal heart sounds.  Pulmonary:     Effort: Pulmonary effort is normal. No respiratory  distress.     Breath sounds: Normal breath sounds.  Abdominal:     General: Bowel sounds are normal. There is no distension.     Palpations: Abdomen is soft.     Tenderness: There is no abdominal tenderness.  Musculoskeletal:     Cervical back: Neck supple.     Right lower leg: No edema.     Left lower leg: No edema.     Comments:  Has left side weakness Leans head to the left  Bilateral hand tremor  Has bilateral eyebrow movement Right arm shrug  Lymphadenopathy:     Cervical: No cervical adenopathy.  Skin:    General: Skin is warm and dry.  Neurological:     Mental Status: She is alert. Mental status is at baseline.  Psychiatric:        Mood and Affect: Mood normal.       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     Ok Edwards NP [facility]   call [phone]

## 2022-02-24 ENCOUNTER — Non-Acute Institutional Stay (SKILLED_NURSING_FACILITY): Payer: Medicare Other | Admitting: Internal Medicine

## 2022-02-24 ENCOUNTER — Other Ambulatory Visit (HOSPITAL_COMMUNITY)
Admission: RE | Admit: 2022-02-24 | Discharge: 2022-02-24 | Disposition: A | Discharge status: Home / Self Care | Payer: Medicare Other | Source: Skilled Nursing Facility | Attending: Internal Medicine | Admitting: Internal Medicine

## 2022-02-24 ENCOUNTER — Encounter: Payer: Self-pay | Admitting: Internal Medicine

## 2022-02-24 DIAGNOSIS — R768 Other specified abnormal immunological findings in serum: Secondary | ICD-10-CM | POA: Insufficient documentation

## 2022-02-24 DIAGNOSIS — U071 COVID-19: Secondary | ICD-10-CM | POA: Diagnosis present

## 2022-02-24 DIAGNOSIS — J449 Chronic obstructive pulmonary disease, unspecified: Secondary | ICD-10-CM

## 2022-02-24 DIAGNOSIS — N182 Chronic kidney disease, stage 2 (mild): Secondary | ICD-10-CM

## 2022-02-24 LAB — CBC
HCT: 46.3 % — ABNORMAL HIGH (ref 36.0–46.0)
Hemoglobin: 15.6 g/dL — ABNORMAL HIGH (ref 12.0–15.0)
MCH: 33.3 pg (ref 26.0–34.0)
MCHC: 33.7 g/dL (ref 30.0–36.0)
MCV: 98.7 fL (ref 80.0–100.0)
Platelets: 214 10*3/uL (ref 150–400)
RBC: 4.69 MIL/uL (ref 3.87–5.11)
RDW: 12 % (ref 11.5–15.5)
WBC: 6.7 10*3/uL (ref 4.0–10.5)
nRBC: 0 % (ref 0.0–0.2)

## 2022-02-24 LAB — BASIC METABOLIC PANEL
Anion gap: 10 (ref 5–15)
BUN: 36 mg/dL — ABNORMAL HIGH (ref 8–23)
CO2: 26 mmol/L (ref 22–32)
Calcium: 9.8 mg/dL (ref 8.9–10.3)
Chloride: 105 mmol/L (ref 98–111)
Creatinine, Ser: 1.01 mg/dL — ABNORMAL HIGH (ref 0.44–1.00)
GFR, Estimated: 58 mL/min — ABNORMAL LOW (ref >60)
Glucose, Bld: 132 mg/dL — ABNORMAL HIGH (ref 70–99)
Potassium: 4.3 mmol/L (ref 3.5–5.1)
Sodium: 141 mmol/L (ref 135–145)

## 2022-02-24 LAB — C-REACTIVE PROTEIN: CRP: 1.1 mg/dL — ABNORMAL HIGH (ref <1.0)

## 2022-02-24 LAB — D-DIMER, QUANTITATIVE: D-Dimer, Quant: 0.42 ug/mL-FEU (ref 0.00–0.50)

## 2022-02-24 NOTE — Assessment & Plan Note (Signed)
CKD now high Stage 3 a; no change in meds or dosages indicated.

## 2022-02-24 NOTE — Assessment & Plan Note (Addendum)
She has subjective symptoms which could be related to COVID including headache, dyspnea, cough; but she is not exhibiting tachypnea or hypoxia and exam is essentially unremarkable in reference to pulmonary status despite her COPD history. Antiviral therapy with Molnupiravir ordered. No DOAC or steroids as CRP & D dimer not significantly elevated.

## 2022-02-24 NOTE — Patient Instructions (Signed)
See assessment and plan under each diagnosis in the problem list and acutely for this visit 

## 2022-02-24 NOTE — Progress Notes (Signed)
   NURSING HOME LOCATION:  [facility] ROOM NUMBER:  [id number]  CODE STATUS:  Full Code  PCP:  Ok Edwards NP  This is a nursing facility follow up visit for specific acute issue of + COVID -19 PCR screen.  Interim medical record and care since last SNF visit was updated with review of diagnostic studies and change in clinical status since last visit were documented.  [id number] 19 PCR positive Imaging negative pneumonia White count is normal at 6700.  Hemoconcentration is suggested by H/H of 15.6/46.3. Creatinine 1.01 & GFR 58 , indicating CKD Stage 3a. Course of Molnupiravir ,oral antiviral initiated. Peak CRP 1.1 ,peak D dimer 0.42. DOAC & steroids not initiated.    Review of systems: She exhibits a high-pitched hyponasal speech pattern with almost indiscernible differentiation of the words.  She seems to indicate that she is having some headache, cough, and shortness of breath.  She was aware that she has COVID.  Constitutional: No fever, significant weight change Eyes: No redness, discharge, pain, vision change ENT/mouth: No nasal congestion,  purulent discharge, earache, change in hearing, sore throat  Cardiovascular: No chest pain, palpitations, paroxysmal nocturnal dyspnea, claudication, edema  Respiratory: No  sputum production, hemoptysis Gastrointestinal: No heartburn, dysphagia, abdominal pain, nausea /vomiting, rectal bleeding, melena, change in bowels Genitourinary: No dysuria, hematuria, pyuria, incontinence, nocturia Musculoskeletal: No joint stiffness, joint swelling, weakness, pain Dermatologic: No new rash, pruritus, change in appearance of skin Neurologic: No dizziness, headache, syncope, seizures Endocrine: No change in hair/skin/nails, excessive thirst, excessive hunger, excessive urination  Hematologic/lymphatic: No significant bruising, lymphadenopathy, abnormal bleeding  Physical exam:  Pertinent or positive findings: As noted her speech  pattern is almost indecipherable with a hyponasal quality.  She exhibits grimacing facies with wide-eyed expression.  She has a single maxillary tooth and a few remaining posterior mandibular teeth.  Pedal pulses are palpable.  She has interosseous wasting of the hands.  General appearance: no acute distress, increased work of breathing is present.   Lymphatic: No lymphadenopathy about the head, neck, axilla. Eyes: No conjunctival inflammation or lid edema is present. There is no scleral icterus. Ears:  External ear exam shows no significant lesions or deformities.   Nose:  External nasal examination shows no deformity or inflammation. Nasal mucosa are pink and moist without lesions, exudates Neck:  No thyromegaly, masses, tenderness noted.    Heart:  Normal rate and regular rhythm. S1 and S2 normal without gallop, murmur, click, rub .  Lungs: without wheezes, rhonchi, rales, rubs. Abdomen: Bowel sounds are normal. Abdomen is soft and nontender with no organomegaly, hernias, masses. GU: Deferred  Extremities:  No cyanosis, clubbing, edema  Neurologic exam :Balance, Rhomberg, finger to nose testing could not be completed due to clinical state Skin: Warm & dry w/o tenting. No significant lesions or rash.  See summary under each active problem in the Problem List with associated updated therapeutic plan

## 2022-02-24 NOTE — Assessment & Plan Note (Signed)
No decompensation despite + COVID -19 PCR.

## 2022-02-26 ENCOUNTER — Other Ambulatory Visit: Payer: Self-pay | Admitting: Adult Health

## 2022-02-26 MED ORDER — ALPRAZOLAM 0.25 MG PO TABS
0.2500 mg | ORAL_TABLET | Freq: Two times a day (BID) | ORAL | 0 refills | Status: DC
Start: 2022-02-26 — End: 2022-03-25

## 2022-03-04 ENCOUNTER — Encounter: Payer: Self-pay | Admitting: Adult Health

## 2022-03-04 ENCOUNTER — Non-Acute Institutional Stay (SKILLED_NURSING_FACILITY): Payer: Medicare Other | Admitting: Adult Health

## 2022-03-04 DIAGNOSIS — F482 Pseudobulbar affect: Secondary | ICD-10-CM

## 2022-03-04 DIAGNOSIS — E782 Mixed hyperlipidemia: Secondary | ICD-10-CM

## 2022-03-04 DIAGNOSIS — R45851 Suicidal ideations: Secondary | ICD-10-CM

## 2022-03-04 DIAGNOSIS — N3281 Overactive bladder: Secondary | ICD-10-CM

## 2022-03-04 DIAGNOSIS — F323 Major depressive disorder, single episode, severe with psychotic features: Secondary | ICD-10-CM | POA: Diagnosis not present

## 2022-03-04 DIAGNOSIS — F32A Depression, unspecified: Secondary | ICD-10-CM

## 2022-03-04 NOTE — Progress Notes (Signed)
Location:  [place] Room Number: 108-W Place of Service:  SNF (31)   CODE STATUS: DNR  Allergies  Allergen Reactions   Oxycodone Other (See Comments)    Can tolerate low dose mixed with tynlelol HALLUCINATIONS    Buspar [Buspirone] Other (See Comments)    Unknown   Docosahexaenoic Acid Other (See Comments)   Eicosapentaenoic Acid    Eicosapentaenoic Acid (Epa)    Lutein Nausea And Vomiting   Omega-3 Fatty Acids Other (See Comments)   Other    Aspirin Nausea And Vomiting and Other (See Comments)    Severe stomach pain    Codeine Nausea Only    Makes her feel very sick     Chief Complaint  Patient presents with   Medical Management of Chronic Issues                                       OAB (over active bladder)  Mixed hyperlipidemia. 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 There are no indications of uncontrolled pain. There are no reports of anxiety or agitation present.   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   PONV (postoperative nausea and vomiting)    states it has been a long time since getting sick   Sleep apnea    Has CPAP but doesnt use   Stroke ([place])    TIA (transient ischemic attack)    not really TIA but abnormal MRI per pt left sided weakness   UTI (lower urinary tract infection)    Vitamin D deficiency     Past Surgical History:   Procedure Laterality Date   BACK SURGERY     Battery change     DBS, 12/2009   BIOPSY N/A 11/29/2013   Procedure: GASTRIC BIOPSY;  Surgeon: Danie Binder, MD;  Location: [facility];  Service: Endoscopy;  Laterality: N/A;   BURR HOLE W/ STEREOTACTIC INSERTION OF DBS LEADS / INTRAOP MICROELECTRODE RECORDING     2007   CARPAL TUNNEL RELEASE Right    COLONOSCOPY WITH PROPOFOL N/A 11/29/2013   Procedure: COLONOSCOPY WITH PROPOFOL;  Surgeon: Danie Binder, MD;  Location: [facility];  Service: Endoscopy;  Laterality: N/A;  entered cecum @ [phone]; total cecal withdrawal time 21 minutes    ELBOW SURGERY Left    after fx   ESOPHAGEAL DILATION N/A 11/29/2013   Procedure: ESOPHAGEAL DILATION;  Surgeon: Danie Binder, MD;  Location: [facility];  Service: Endoscopy;  Laterality: N/A;  [place] 14/15/16   ESOPHAGOGASTRODUODENOSCOPY (EGD) WITH PROPOFOL N/A 11/29/2013   Procedure: ESOPHAGOGASTRODUODENOSCOPY (EGD) WITH PROPOFOL;  Surgeon: Danie Binder, MD;  Location: [facility];  Service: Endoscopy;  Laterality: N/A;   FOOT SURGERY Left    "something with 2nd 2."   NECK  SURGERY     Fusion   POLYPECTOMY N/A 11/29/2013   Procedure: POLYPECTOMY;  Surgeon: Danie Binder, MD;  Location: [facility];  Service: Endoscopy;  Laterality: N/A;  Distal Transverse Colon x2    PR ANALYZE NEUROSTIM BRAIN, FIRST 1H  10/15/2012       PULSE GENERATOR IMPLANT Right 03/30/2018   Procedure: Right Implantable pulse generator change;  Surgeon: Erline Levine, MD;  Location: [place];  Service: Neurosurgery;  Laterality: Right;   SUBTHALAMIC STIMULATOR BATTERY REPLACEMENT N/A 08/05/2013   Procedure: SUBTHALAMIC STIMULATOR BATTERY REPLACEMENT;  Surgeon: Erline Levine, MD;  Location: [place] [facility];  Service: Neurosurgery;  Laterality: N/A;   SUBTHALAMIC STIMULATOR BATTERY REPLACEMENT N/A 11/26/2015   Procedure: Implantable pulse generator battery change for deep brain stimulator;  Surgeon: Erline Levine, MD;  Location: [place] [facility];  Service: Neurosurgery;   Laterality: N/A;   SUBTHALAMIC STIMULATOR BATTERY REPLACEMENT Right 09/13/2020   Procedure: Right chest implantable pulse generator change for depleted battery;  Surgeon: Erline Levine, MD;  Location: [place];  Service: Neurosurgery;  Laterality: Right;  3C/RM 18   VAGINAL HYSTERECTOMY     "partial"   WRIST SURGERY Right    after fx    Social History   Socioeconomic History   Marital status: Widowed    Spouse name: Not on file   Number of children: Not on file   Years of education: Not on file   Highest education level: Not on file  Occupational History   Occupation: Retired  Tobacco Use   Smoking status: Former    Packs/day: 0.50    Years: 25.00    Total pack years: 12.50    Types: Cigarettes    Quit date: 03/01/2019    Years since quitting: 3.0   Smokeless tobacco: Never  Vaping Use   Vaping Use: Never used  Substance and Sexual Activity   Alcohol use: Not Currently   Drug use: No   Sexual activity: Not on file  Other Topics Concern   Not on file  Social History Narrative   She resides at [facility] (assisted living facility).   Social Determinants of Health   Financial Resource Strain: Low Risk  (03/29/2019)   Overall Financial Resource Strain (CARDIA)    Difficulty of Paying Living Expenses: Not very hard  Food Insecurity: Not on file  Transportation Needs: No Transportation Needs (03/29/2019)   PRAPARE - Hydrologist (Medical): No    Lack of Transportation (Non-Medical): No  Physical Activity: Not on file  Stress: Not on file  Social Connections: Moderately Isolated (03/29/2019)   Social Connection and Isolation Panel [NHANES]    Frequency of Communication with Friends and Family: More than three times a week    Frequency of Social Gatherings with Friends and Family: Once a week    Attends Religious Services: 1 to 4 times per year    Active Member of [employer] or Organizations: No    Attends Archivist Meetings: Never     Marital Status: Widowed  Intimate Partner Violence: Unknown (03/29/2019)   Humiliation, Afraid, Rape, and Kick questionnaire    Fear of Current or Ex-Partner: No    Emotionally Abused: Not on file    Physically Abused: Not on file    Sexually Abused: Not on file   Family History  Problem Relation Age of Onset   Colon cancer Paternal Uncle       VITAL SIGNS BP 138/74   Pulse 63  Temp 98 F (36.7 C)   Resp 20   Ht '5\' 2"'$  (1.575 m)   Wt 139 lb 9.6 oz (63.3 kg)   SpO2 96%   BMI 25.53 kg/m   Outpatient Encounter Medications as of 03/04/2022  Medication Sig   acetaminophen (TYLENOL) 500 MG tablet Take 500 mg by mouth every 8 (eight) hours.   ALPRAZolam (XANAX) 0.25 MG tablet Take 1 tablet (0.25 mg total) by mouth 2 (two) times daily. Major depressive disorder, recurrent, unspecified   amLODipine (NORVASC) 5 MG tablet Take 5 mg by mouth daily.   ascorbic acid (VITAMIN C) 500 MG tablet Take 500 mg by mouth 2 (two) times daily.   BREZTRI AEROSPHERE 160-9-4.8 MCG/ACT AERO Inhale 2 puffs into the lungs 2 (two) times daily.   Camphor-Menthol-Methyl Sal (SALONPAS) 3.05-31-08 % [facility] Special Instructions: apply one pack to both sides of lower back daily and remove and at HS. for bilateral lower back pain. [DX: Chronic pain syndrome-per NP note]   carbidopa-levodopa (SINEMET IR) 25-100 MG tablet Take 1 tablet by mouth 2 (two) times daily.   Cholecalciferol 50 MCG (2000 UT) CAPS Take 1 capsule by mouth daily.   clopidogrel (PLAVIX) 75 MG tablet Take 75 mg by mouth daily.   Dextromethorphan-quiNIDine (NUEDEXTA) 20-10 MG capsule Take 1 capsule by mouth 2 (two) times daily.   docusate sodium (COLACE) 100 MG capsule Take 100 mg by mouth daily.   donepezil (ARICEPT) 5 MG tablet Take 5 mg by mouth at bedtime. 9 pm for parkinson's dementia   DULoxetine (CYMBALTA) 60 MG capsule Take 60 mg by mouth daily. 9 am   Ergocalciferol (VITAMIN D2 PO) Take by mouth. 1,250 mcg (50,000 unit); oral Once A Day  Every 7 Days Special Instructions: Give once a week x 2 doses.   losartan (COZAAR) 100 MG tablet Take 100 mg by mouth daily.   meloxicam (MOBIC) 15 MG tablet Take 15 mg by mouth daily.   NON FORMULARY Diet: : Dys 3 (chopped) diet, continue thin liquids; will allow pizza if requested  Liquids:Regular   omeprazole (PRILOSEC) 20 MG capsule Take 20 mg by mouth in the morning and at bedtime.    polyethylene glycol (MIRALAX / GLYCOLAX) 17 g packet Take 17 g by mouth daily as needed.   pramipexole (MIRAPEX) 0.5 MG tablet Take 0.5 mg by mouth at bedtime.   QUEtiapine (SEROQUEL) 25 MG tablet Take 12.5 mg by mouth daily.   traZODone (DESYREL) 50 MG tablet Take by mouth at bedtime.   Zinc-Vitamin C (ZINC-A-COLD/VITAMIN C MT) Use as directed in the mouth or throat. Special Instructions: Give 5 Times Per Day x 2 weeks.   No facility-administered encounter medications on file as of 03/04/2022.     SIGNIFICANT DIAGNOSTIC EXAMS   LABS REVIEWED; PREVIOUS    04-11-21: hgb a1c 5.5; tsh 1.431 free t4: 1.12 05-30-21: glucose 106; bun 26; creat 0.95; k+ 4.0; na++ 138; ca 9.6; GFR>60 07-12-21: wbc 7.5; hgb 15.5; hct 46.0; mcv 97.5 plt 252; glucose 112; bun 31; creat 0.98; k+ 4.0; na++ 138; ca 9.5 GFR>60  11-07-21: chol 134; ldl 66; trig 173 hdl 33; hepatitis C: nr   TODAY  01-30-22; wbc 6.0; hgb 14.5; hct 42.1; mcv 96.3 plt 190; glucose 102; bun 21; creat 0.76; k+ 4.0; na++ 140; ca 9.3 gfr >60; protein 6.5; albumin 3.6 hgb a1c 5.2; tsh 1.695 02-24-22: wbc 6.7; hgb 15.6; hct 46.3; mcv 98.7 pt 214; glucose 132; bun 36; creat 1.01; k+ 4.3; na++ 141; ca  9.8 gfr 58 d dimer 0.42; crp 1.1   Review of Systems  Reason unable to perform ROS: unable to fulliy participate.   Physical Exam Constitutional:      General: She is not in acute distress.    Appearance: She is well-developed. She is not diaphoretic.  Neck:     Thyroid: No thyromegaly.  Cardiovascular:     Rate and Rhythm: Normal rate and regular rhythm.      Pulses: Normal pulses.     Heart sounds: Normal heart sounds.  Pulmonary:     Effort: Pulmonary effort is normal. No respiratory distress.     Breath sounds: Normal breath sounds.  Abdominal:     General: Bowel sounds are normal. There is no distension.     Palpations: Abdomen is soft.     Tenderness: There is no abdominal tenderness.  Musculoskeletal:     Cervical back: Neck supple.     Right lower leg: No edema.     Left lower leg: No edema.     Comments: Has left side weakness Leans head to the left  Bilateral hand tremor  Has bilateral eyebrow movement Right arm shrug   Lymphadenopathy:     Cervical: No cervical adenopathy.  Skin:    General: Skin is warm and dry.  Neurological:     Mental Status: She is alert. Mental status is at baseline.  Psychiatric:        Mood and Affect: Mood normal.     ASSESSMENT/ PLAN:  TODAY  OAB (over active bladder) is off ditropan; is completely incontinent  2. Mixed hyperlipidemia LDL 66 will continue lipitor 40 mg daily   3. PBA: will continue neudexta 10/20 mg twice daily   4. 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 COPD with exacerbation: will continue breztri aerosphere 160-4.5 mcg 2 puffs daily   7.  GERD without esophagitis will continue prilosec 20 mg daily   8. Chronic constipation: will continue colace daily and miralax daily as needed   9. Chronic pain syndrome: will continue cymbalta 60 mg daily (also takes for mood state) salon patches to back X 2.   10. Parkinson's disease related dementia: weight is 139 pounds; is on aricept 5 mg daily   11. Essential hypertension: b/p 138/74 will continue norvac 5 mg daily and cozaar 100 mg daily   12. Parkinson's disease is stable is status post deep brain  stimulator: will continue sinemet IR 25/100 mg twice daily and mirapex 0.5 mg nightly       Ok Edwards NP [facility]  call [phone]

## 2022-03-21 ENCOUNTER — Non-Acute Institutional Stay (SKILLED_NURSING_FACILITY): Payer: Medicare Other | Admitting: Adult Health

## 2022-03-21 ENCOUNTER — Encounter: Payer: Self-pay | Admitting: Adult Health

## 2022-03-21 DIAGNOSIS — I10 Essential (primary) hypertension: Secondary | ICD-10-CM

## 2022-03-21 NOTE — Progress Notes (Unsigned)
Location:  [place] Room Number: 108 Place of Service:  SNF (31) Provider:  Ok Edwards, NP   CODE STATUS: DNR  Allergies  Allergen Reactions   Oxycodone Other (See Comments)    Can tolerate low dose mixed with tynlelol HALLUCINATIONS    Buspar [Buspirone] Other (See Comments)    Unknown   Docosahexaenoic Acid Other (See Comments)   Eicosapentaenoic Acid    Eicosapentaenoic Acid (Epa)    Lutein Nausea And Vomiting   Omega-3 Fatty Acids Other (See Comments)   Other    Aspirin Nausea And Vomiting and Other (See Comments)    Severe stomach pain    Codeine Nausea Only    Makes her feel very sick     Chief Complaint  Patient presents with   Acute Visit    Hypertension    HPI:  She has been having soft blood pressure readings. In the low 100's over 60-70's. There are no repots of dizziness present. She is presently taking norvasc 5 mg daily and cozaar 100 mg daily. She does have some trace lower extremity edema. There are no reports of uncontrolled pain. No changes in her appetite her weight is stable at this time.   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   PONV (postoperative nausea and vomiting)    states it has been a long time since getting sick   Sleep apnea    Has CPAP but doesnt use   Stroke ([place])    TIA (transient ischemic attack)    not really TIA but abnormal MRI per pt left sided weakness   UTI (lower urinary tract infection)    Vitamin D deficiency     Past Surgical History:  Procedure Laterality Date   BACK SURGERY     Battery change     DBS, 12/2009   BIOPSY N/A 11/29/2013   Procedure: GASTRIC BIOPSY;  Surgeon: Danie Binder,  MD;  Location: [facility];  Service: Endoscopy;  Laterality: N/A;   BURR HOLE W/ STEREOTACTIC INSERTION OF DBS LEADS / INTRAOP MICROELECTRODE RECORDING     2007   CARPAL TUNNEL RELEASE Right    COLONOSCOPY WITH PROPOFOL N/A 11/29/2013   Procedure: COLONOSCOPY WITH PROPOFOL;  Surgeon: Danie Binder, MD;  Location: [facility];  Service: Endoscopy;  Laterality: N/A;  entered cecum @ [phone]; total cecal withdrawal time 21 minutes    ELBOW SURGERY Left    after fx   ESOPHAGEAL DILATION N/A 11/29/2013   Procedure: ESOPHAGEAL DILATION;  Surgeon: Danie Binder, MD;  Location: [facility];  Service: Endoscopy;  Laterality: N/A;  [place] 14/15/16   ESOPHAGOGASTRODUODENOSCOPY (EGD) WITH PROPOFOL N/A 11/29/2013   Procedure: ESOPHAGOGASTRODUODENOSCOPY (EGD) WITH PROPOFOL;  Surgeon: Danie Binder, MD;  Location: [facility];  Service: Endoscopy;  Laterality: N/A;   FOOT SURGERY Left    "something with 2nd 2."   NECK SURGERY     Fusion   POLYPECTOMY N/A 11/29/2013   Procedure: POLYPECTOMY;  Surgeon: Danie Binder, MD;  Location: [facility];  Service: Endoscopy;  Laterality: N/A;  Distal Transverse Colon x2    PR ANALYZE NEUROSTIM BRAIN,  FIRST 1H  10/15/2012       PULSE GENERATOR IMPLANT Right 03/30/2018   Procedure: Right Implantable pulse generator change;  Surgeon: Erline Levine, MD;  Location: [place];  Service: Neurosurgery;  Laterality: Right;   SUBTHALAMIC STIMULATOR BATTERY REPLACEMENT N/A 08/05/2013   Procedure: SUBTHALAMIC STIMULATOR BATTERY REPLACEMENT;  Surgeon: Erline Levine, MD;  Location: [place] [facility];  Service: Neurosurgery;  Laterality: N/A;   SUBTHALAMIC STIMULATOR BATTERY REPLACEMENT N/A 11/26/2015   Procedure: Implantable pulse generator battery change for deep brain stimulator;  Surgeon: Erline Levine, MD;  Location: [place] [facility];  Service: Neurosurgery;  Laterality: N/A;   SUBTHALAMIC STIMULATOR BATTERY REPLACEMENT Right 09/13/2020   Procedure: Right chest implantable pulse generator change for depleted battery;   Surgeon: Erline Levine, MD;  Location: [place];  Service: Neurosurgery;  Laterality: Right;  3C/RM 18   VAGINAL HYSTERECTOMY     "partial"   WRIST SURGERY Right    after fx    Social History   Socioeconomic History   Marital status: Widowed    Spouse name: Not on file   Number of children: Not on file   Years of education: Not on file   Highest education level: Not on file  Occupational History   Occupation: Retired  Tobacco Use   Smoking status: Former    Packs/day: 0.50    Years: 25.00    Total pack years: 12.50    Types: Cigarettes    Quit date: 03/01/2019    Years since quitting: 3.0   Smokeless tobacco: Never  Vaping Use   Vaping Use: Never used  Substance and Sexual Activity   Alcohol use: Not Currently   Drug use: No   Sexual activity: Not on file  Other Topics Concern   Not on file  Social History Narrative   She resides at [facility] (assisted living facility).   Social Determinants of Health   Financial Resource Strain: Low Risk  (03/29/2019)   Overall Financial Resource Strain (CARDIA)    Difficulty of Paying Living Expenses: Not very hard  Food Insecurity: Not on file  Transportation Needs: No Transportation Needs (03/29/2019)   PRAPARE - Hydrologist (Medical): No    Lack of Transportation (Non-Medical): No  Physical Activity: Not on file  Stress: Not on file  Social Connections: Moderately Isolated (03/29/2019)   Social Connection and Isolation Panel [NHANES]    Frequency of Communication with Friends and Family: More than three times a week    Frequency of Social Gatherings with Friends and Family: Once a week    Attends Religious Services: 1 to 4 times per year    Active Member of [employer] or Organizations: No    Attends Archivist Meetings: Never    Marital Status: Widowed  Intimate Partner Violence: Unknown (03/29/2019)   Humiliation, Afraid, Rape, and Kick questionnaire    Fear of Current or Ex-Partner: No     Emotionally Abused: Not on file    Physically Abused: Not on file    Sexually Abused: Not on file   Family History  Problem Relation Age of Onset   Colon cancer Paternal Uncle       VITAL SIGNS BP 106/71   Pulse 61   Temp 97.8 F (36.6 C)   Resp 19   Ht '5\' 2"'$  (1.575 m)   Wt 139 lb 9.6 oz (63.3 kg)   SpO2 98%   BMI 25.53 kg/m   Outpatient Encounter Medications as of 03/21/2022  Medication Sig   acetaminophen (TYLENOL) 500 MG tablet Take 500 mg by mouth in the morning, at noon, and at bedtime.   ALPRAZolam (XANAX) 0.25 MG tablet Take 1 tablet (0.25 mg total) by mouth 2 (two) times daily. Major depressive disorder, recurrent, unspecified   BREZTRI AEROSPHERE 160-9-4.8 MCG/ACT AERO Inhale 2 puffs into the lungs 2 (two) times daily.   Camphor-Menthol-Methyl Sal (SALONPAS) 3.05-31-08 % [facility] Special Instructions: apply one pack to both sides of lower back daily and remove and at HS. for bilateral lower back pain. [DX: Chronic pain syndrome-per NP note]   carbidopa-levodopa (SINEMET IR) 25-100 MG tablet Take 1 tablet by mouth 2 (two) times daily.   Cholecalciferol 50 MCG (2000 UT) CAPS Take 1 capsule by mouth daily.   clopidogrel (PLAVIX) 75 MG tablet Take 75 mg by mouth daily.   Dextromethorphan-quiNIDine (NUEDEXTA) 20-10 MG capsule Take 1 capsule by mouth 2 (two) times daily.   docusate sodium (COLACE) 100 MG capsule Take 100 mg by mouth daily.   donepezil (ARICEPT) 5 MG tablet Take 5 mg by mouth at bedtime. 9 pm for parkinson's dementia   DULoxetine (CYMBALTA) 60 MG capsule Take 60 mg by mouth daily. 9 am   losartan (COZAAR) 100 MG tablet Take 100 mg by mouth daily.   meloxicam (MOBIC) 15 MG tablet Take 15 mg by mouth daily.   NON FORMULARY Diet: : Dys 3 (chopped) diet, continue thin liquids; will allow pizza if requested  Liquids:Regular   Nutritional Supplements (ENSURE ENLIVE PO) Take by mouth. With meals   omeprazole (PRILOSEC) 20 MG capsule Take 20 mg by mouth in the morning  and at bedtime.    polyethylene glycol (MIRALAX / GLYCOLAX) 17 g packet Take 17 g by mouth daily as needed.   pramipexole (MIRAPEX) 0.5 MG tablet Take 0.5 mg by mouth at bedtime.   QUEtiapine (SEROQUEL) 25 MG tablet Take 12.5 mg by mouth daily.   traZODone (DESYREL) 50 MG tablet Take by mouth at bedtime.   No facility-administered encounter medications on file as of 03/21/2022.     SIGNIFICANT DIAGNOSTIC EXAMS  LABS REVIEWED; PREVIOUS    04-11-21: hgb a1c 5.5; tsh 1.431 free t4: 1.12 05-30-21: glucose 106; bun 26; creat 0.95; k+ 4.0; na++ 138; ca 9.6; GFR>60 07-12-21: wbc 7.5; hgb 15.5; hct 46.0; mcv 97.5 plt 252; glucose 112; bun 31; creat 0.98; k+ 4.0; na++ 138; ca 9.5 GFR>60  11-07-21: chol 134; ldl 66; trig 173 hdl 33; hepatitis C: nr  01-30-22; wbc 6.0; hgb 14.5; hct 42.1; mcv 96.3 plt 190; glucose 102; bun 21; creat 0.76; k+ 4.0; na++ 140; ca 9.3 gfr >60; protein 6.5; albumin 3.6 hgb a1c 5.2; tsh 1.695 02-24-22: wbc 6.7; hgb 15.6; hct 46.3; mcv 98.7 pt 214; glucose 132; bun 36; creat 1.01; k+ 4.3; na++ 141; ca 9.8 gfr 58 d dimer 0.42; crp 1.1   NO NEW LABS.   Review of Systems  Reason unable to perform ROS: unable to participate.   Physical Exam Constitutional:      General: She is not in acute distress.    Appearance: She is well-developed. She is not diaphoretic.  Neck:     Thyroid: No thyromegaly.  Cardiovascular:     Rate and Rhythm: Normal rate and regular rhythm.     Pulses: Normal pulses.     Heart sounds: Normal heart sounds.  Pulmonary:     Effort: Pulmonary effort is normal. No respiratory distress.     Breath sounds: Normal breath  sounds.  Abdominal:     General: Bowel sounds are normal. There is no distension.     Palpations: Abdomen is soft.     Tenderness: There is no abdominal tenderness.  Musculoskeletal:     Cervical back: Neck supple.     Right lower leg: No edema.     Left lower leg: No edema.     Comments: Has left side weakness Leans head to the  left  Bilateral hand tremor  Has bilateral eyebrow movement Right arm shrug    Lymphadenopathy:     Cervical: No cervical adenopathy.  Skin:    General: Skin is warm and dry.  Neurological:     Mental Status: She is alert. Mental status is at baseline.  Psychiatric:        Mood and Affect: Mood normal.      ASSESSMENT/ PLAN:  TODAY  Primary (essential) hypertension: b/p 106/71 will stop her norvasc at this time    Ok Edwards NP [facility]  call [phone]

## 2022-03-25 ENCOUNTER — Other Ambulatory Visit: Payer: Self-pay | Admitting: Adult Health

## 2022-03-25 MED ORDER — ALPRAZOLAM 0.25 MG PO TABS: 0.2500 mg | ORAL_TABLET | Freq: Two times a day (BID) | ORAL | 0 refills | Status: DC

## 2022-04-11 ENCOUNTER — Non-Acute Institutional Stay (SKILLED_NURSING_FACILITY): Payer: Medicare Other | Admitting: Adult Health

## 2022-04-11 ENCOUNTER — Encounter: Payer: Self-pay | Admitting: Adult Health

## 2022-04-11 DIAGNOSIS — G20A1 Parkinson's disease without dyskinesia, without mention of fluctuations: Secondary | ICD-10-CM | POA: Diagnosis not present

## 2022-04-11 DIAGNOSIS — F02C2 Dementia in other diseases classified elsewhere, severe, with psychotic disturbance: Secondary | ICD-10-CM | POA: Diagnosis not present

## 2022-04-11 DIAGNOSIS — G20B2 Parkinson's disease with dyskinesia, with fluctuations: Secondary | ICD-10-CM

## 2022-04-11 DIAGNOSIS — F323 Major depressive disorder, single episode, severe with psychotic features: Secondary | ICD-10-CM | POA: Diagnosis not present

## 2022-04-11 NOTE — Progress Notes (Signed)
Location:  [place] Room Number: 108 Place of Service:  SNF (31)   CODE STATUS: dnr   Allergies  Allergen Reactions   Oxycodone Other (See Comments)    Can tolerate low dose mixed with tynlelol HALLUCINATIONS    Buspar [Buspirone] Other (See Comments)    Unknown   Docosahexaenoic Acid Other (See Comments)   Eicosapentaenoic Acid    Eicosapentaenoic Acid (Epa)    Lutein Nausea And Vomiting   Omega-3 Fatty Acids Other (See Comments)   Other    Aspirin Nausea And Vomiting and Other (See Comments)    Severe stomach pain    Codeine Nausea Only    Makes her feel very sick     Chief Complaint  Patient presents with   Acute Visit    Care plan meeting     HPI:  We have come together for her care plan meeting.  BIMS 15/15; mood 12/30: depression; decreased appetite; decreased energy; nervous. She is nonambulatory with no falls. She is dependent assist with her adls. She is incontinent of bladder and frequently incontinent of bowel. Dietary: weight is 134 pounds is slowly losing weight  D3 ground meat 25-75% appetite; set up for meals.  Therapy:OT: for fine motor grip; will need to be out of bed for meals. 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   Past Medical History:  Diagnosis Date   Anxiety    Arthritis    Complication of anesthesia    Depression    Depression with suicidal ideation 01/21/2021   Dysphasia    GERD (gastroesophageal reflux disease)    Hyperlipidemia    Hypertension    Parkinson's disease    diagnosed at age 60   Parkinson's disease dementia ([place]) 07/31/2020   PONV (postoperative nausea and vomiting)    states it has been a long time since getting sick   Sleep apnea    Has CPAP but doesnt use   Stroke ([place])    TIA (transient  ischemic attack)    not really TIA but abnormal MRI per pt left sided weakness   UTI (lower urinary tract infection)    Vitamin D deficiency     Past Surgical History:  Procedure Laterality Date   BACK SURGERY     Battery change     DBS, 12/2009   BIOPSY N/A 11/29/2013   Procedure: GASTRIC BIOPSY;  Surgeon: Danie Binder, MD;  Location: [facility];  Service: Endoscopy;  Laterality: N/A;   BURR HOLE W/ STEREOTACTIC INSERTION OF DBS LEADS / INTRAOP MICROELECTRODE RECORDING     2007   CARPAL TUNNEL RELEASE Right    COLONOSCOPY WITH PROPOFOL N/A 11/29/2013   Procedure: COLONOSCOPY WITH PROPOFOL;  Surgeon: Danie Binder, MD;  Location: [facility];  Service: Endoscopy;  Laterality: N/A;  entered cecum @ [phone]; total cecal withdrawal time 21 minutes    ELBOW SURGERY Left    after fx   ESOPHAGEAL DILATION N/A 11/29/2013   Procedure: ESOPHAGEAL DILATION;  Surgeon: Danie Binder, MD;  Location: [facility];  Service: Endoscopy;  Laterality: N/A;  [facility] 14/15/16   ESOPHAGOGASTRODUODENOSCOPY (EGD) WITH PROPOFOL N/A 11/29/2013   Procedure: ESOPHAGOGASTRODUODENOSCOPY (EGD) WITH PROPOFOL;  Surgeon: Danie Binder, MD;  Location: [facility];  Service: Endoscopy;  Laterality: N/A;   FOOT SURGERY Left    "  something with 2nd 2."   NECK SURGERY     Fusion   POLYPECTOMY N/A 11/29/2013   Procedure: POLYPECTOMY;  Surgeon: Danie Binder, MD;  Location: [facility];  Service: Endoscopy;  Laterality: N/A;  Distal Transverse Colon x2    PR ANALYZE NEUROSTIM BRAIN, FIRST 1H  10/15/2012       PULSE GENERATOR IMPLANT Right 03/30/2018   Procedure: Right Implantable pulse generator change;  Surgeon: Erline Levine, MD;  Location: [place];  Service: Neurosurgery;  Laterality: Right;   SUBTHALAMIC STIMULATOR BATTERY REPLACEMENT N/A 08/05/2013   Procedure: SUBTHALAMIC STIMULATOR BATTERY REPLACEMENT;  Surgeon: Erline Levine, MD;  Location: [place] [facility];  Service: Neurosurgery;  Laterality: N/A;   SUBTHALAMIC STIMULATOR BATTERY REPLACEMENT N/A  11/26/2015   Procedure: Implantable pulse generator battery change for deep brain stimulator;  Surgeon: Erline Levine, MD;  Location: [place] [facility];  Service: Neurosurgery;  Laterality: N/A;   SUBTHALAMIC STIMULATOR BATTERY REPLACEMENT Right 09/13/2020   Procedure: Right chest implantable pulse generator change for depleted battery;  Surgeon: Erline Levine, MD;  Location: [place];  Service: Neurosurgery;  Laterality: Right;  3C/RM 18   VAGINAL HYSTERECTOMY     "partial"   WRIST SURGERY Right    after fx    Social History   Socioeconomic History   Marital status: Widowed    Spouse name: Not on file   Number of children: Not on file   Years of education: Not on file   Highest education level: Not on file  Occupational History   Occupation: Retired  Tobacco Use   Smoking status: Former    Packs/day: 0.50    Years: 25.00    Total pack years: 12.50    Types: Cigarettes    Quit date: 03/01/2019    Years since quitting: 3.1   Smokeless tobacco: Never  Vaping Use   Vaping Use: Never used  Substance and Sexual Activity   Alcohol use: Not Currently   Drug use: No   Sexual activity: Not on file  Other Topics Concern   Not on file  Social History Narrative   She resides at [facility] (assisted living facility).   Social Determinants of Health   Financial Resource Strain: Low Risk  (03/29/2019)   Overall Financial Resource Strain (CARDIA)    Difficulty of Paying Living Expenses: Not very hard  Food Insecurity: Not on file  Transportation Needs: No Transportation Needs (03/29/2019)   PRAPARE - Hydrologist (Medical): No    Lack of Transportation (Non-Medical): No  Physical Activity: Not on file  Stress: Not on file  Social Connections: Moderately Isolated (03/29/2019)   Social Connection and Isolation Panel [NHANES]    Frequency of Communication with Friends and Family: More than three times a week    Frequency of Social Gatherings with Friends and Family:  Once a week    Attends Religious Services: 1 to 4 times per year    Active Member of [employer] or Organizations: No    Attends Archivist Meetings: Never    Marital Status: Widowed  Intimate Partner Violence: Unknown (03/29/2019)   Humiliation, Afraid, Rape, and Kick questionnaire    Fear of Current or Ex-Partner: No    Emotionally Abused: Not on file    Physically Abused: Not on file    Sexually Abused: Not on file   Family History  Problem Relation Age of Onset   Colon cancer Paternal Uncle       VITAL SIGNS  BP (!) 146/76   Pulse 75   Temp 98.6 F (37 C)   Resp 20   Ht '5\' 2"'$  (1.575 m)   Wt 134 lb 9.6 oz (61.1 kg)   SpO2 97%   BMI 24.62 kg/m   Outpatient Encounter Medications as of 04/11/2022  Medication Sig   acetaminophen (TYLENOL) 500 MG tablet Take 500 mg by mouth in the morning, at noon, and at bedtime.   ALPRAZolam (XANAX) 0.25 MG tablet Take 1 tablet (0.25 mg total) by mouth 2 (two) times daily. Major depressive disorder, recurrent, unspecified   BREZTRI AEROSPHERE 160-9-4.8 MCG/ACT AERO Inhale 2 puffs into the lungs 2 (two) times daily.   Camphor-Menthol-Methyl Sal (SALONPAS) 3.05-31-08 % [facility] Special Instructions: apply one pack to both sides of lower back daily and remove and at HS. for bilateral lower back pain. [DX: Chronic pain syndrome-per NP note]   carbidopa-levodopa (SINEMET IR) 25-100 MG tablet Take 1 tablet by mouth 2 (two) times daily.   Cholecalciferol 50 MCG (2000 UT) CAPS Take 1 capsule by mouth daily.   clopidogrel (PLAVIX) 75 MG tablet Take 75 mg by mouth daily.   Dextromethorphan-quiNIDine (NUEDEXTA) 20-10 MG capsule Take 1 capsule by mouth 2 (two) times daily.   docusate sodium (COLACE) 100 MG capsule Take 100 mg by mouth daily.   donepezil (ARICEPT) 5 MG tablet Take 5 mg by mouth at bedtime. 9 pm for parkinson's dementia   DULoxetine (CYMBALTA) 60 MG capsule Take 60 mg by mouth daily. 9 am   losartan (COZAAR) 100 MG tablet Take 100 mg by  mouth daily.   meloxicam (MOBIC) 15 MG tablet Take 15 mg by mouth daily.   NON FORMULARY Diet: : Dys 3 (chopped) diet, continue thin liquids; will allow pizza if requested  Liquids:Regular   Nutritional Supplements (ENSURE ENLIVE PO) Take by mouth. With meals   omeprazole (PRILOSEC) 20 MG capsule Take 20 mg by mouth in the morning and at bedtime.    polyethylene glycol (MIRALAX / GLYCOLAX) 17 g packet Take 17 g by mouth daily as needed.   pramipexole (MIRAPEX) 0.5 MG tablet Take 0.5 mg by mouth at bedtime.   QUEtiapine (SEROQUEL) 25 MG tablet Take 12.5 mg by mouth daily.   traZODone (DESYREL) 50 MG tablet Take by mouth at bedtime.   No facility-administered encounter medications on file as of 04/11/2022.     SIGNIFICANT DIAGNOSTIC EXAMS  LABS REVIEWED; PREVIOUS    04-11-21: hgb a1c 5.5; tsh 1.431 free t4: 1.12 05-30-21: glucose 106; bun 26; creat 0.95; k+ 4.0; na++ 138; ca 9.6; GFR>60 07-12-21: wbc 7.5; hgb 15.5; hct 46.0; mcv 97.5 plt 252; glucose 112; bun 31; creat 0.98; k+ 4.0; na++ 138; ca 9.5 GFR>60  11-07-21: chol 134; ldl 66; trig 173 hdl 33; hepatitis C: nr  01-30-22; wbc 6.0; hgb 14.5; hct 42.1; mcv 96.3 plt 190; glucose 102; bun 21; creat 0.76; k+ 4.0; na++ 140; ca 9.3 gfr >60; protein 6.5; albumin 3.6 hgb a1c 5.2; tsh 1.695 02-24-22: wbc 6.7; hgb 15.6; hct 46.3; mcv 98.7 pt 214; glucose 132; bun 36; creat 1.01; k+ 4.3; na++ 141; ca 9.8 gfr 58 d dimer 0.42; crp 1.1   NO NEW LABS  Review of Systems  Reason unable to perform ROS: unable to participate.   Physical Exam Constitutional:      General: She is not in acute distress.    Appearance: She is well-developed. She is not diaphoretic.  Neck:     Thyroid: No thyromegaly.  Cardiovascular:     Rate and Rhythm: Normal rate and regular rhythm.     Pulses: Normal pulses.     Heart sounds: Normal heart sounds.  Pulmonary:     Effort: Pulmonary effort is normal. No respiratory distress.     Breath sounds: Normal breath sounds.   Abdominal:     General: Bowel sounds are normal. There is no distension.     Palpations: Abdomen is soft.     Tenderness: There is no abdominal tenderness.  Musculoskeletal:     Cervical back: Neck supple.     Right lower leg: No edema.     Left lower leg: No edema.     Comments:  Has left side weakness Leans head to the left  Bilateral hand tremor  Has bilateral eyebrow movement Right arm shrug      Lymphadenopathy:     Cervical: No cervical adenopathy.  Skin:    General: Skin is warm and dry.  Neurological:     Mental Status: She is alert. Mental status is at baseline.  Psychiatric:        Mood and Affect: Mood normal.      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   Time spent with patient: 40 minutes: medications; dietary plan of care.    Ok Edwards NP [facility]  call [phone]

## 2022-04-21 ENCOUNTER — Other Ambulatory Visit: Payer: Self-pay | Admitting: Adult Health

## 2022-04-21 MED ORDER — ALPRAZOLAM 0.25 MG PO TABS: 0.2500 mg | ORAL_TABLET | Freq: Two times a day (BID) | ORAL | 0 refills | Status: DC

## 2022-04-22 ENCOUNTER — Non-Acute Institutional Stay (SKILLED_NURSING_FACILITY): Payer: Medicare Other | Admitting: Adult Health

## 2022-04-22 ENCOUNTER — Encounter: Payer: Self-pay | Admitting: Adult Health

## 2022-04-22 DIAGNOSIS — J449 Chronic obstructive pulmonary disease, unspecified: Secondary | ICD-10-CM | POA: Diagnosis not present

## 2022-04-22 DIAGNOSIS — K5909 Other constipation: Secondary | ICD-10-CM | POA: Diagnosis not present

## 2022-04-22 DIAGNOSIS — K219 Gastro-esophageal reflux disease without esophagitis: Secondary | ICD-10-CM

## 2022-04-22 DIAGNOSIS — G894 Chronic pain syndrome: Secondary | ICD-10-CM

## 2022-04-22 NOTE — Progress Notes (Unsigned)
Location:  [place]   Place of Service:   SNF    CODE STATUS: dnr   Allergies  Allergen Reactions   Oxycodone Other (See Comments)    Can tolerate low dose mixed with tynlelol HALLUCINATIONS    Buspar [Buspirone] Other (See Comments)    Unknown   Docosahexaenoic Acid Other (See Comments)   Eicosapentaenoic Acid    Eicosapentaenoic Acid (Epa)    Lutein Nausea And Vomiting   Omega-3 Fatty Acids Other (See Comments)   Other    Aspirin Nausea And Vomiting and Other (See Comments)    Severe stomach pain    Codeine Nausea Only    Makes her feel very sick     Chief Complaint  Patient presents with   Medical Management of Chronic Issues                COPD without exacerbation:  GERD without esophagitis: Chronic constipation: Chronic pain syndrome     HPI:  She is a 76 year old long term resident of this facility being seen for the management of her chronic illnesses:  COPD without exacerbation:  GERD without esophagitis: Chronic constipation: Chronic pain syndrome . There are no indications of uncontrolled pain present. Her weight is stable at this time. Three are no reports of contipation.   Past Medical History:  Diagnosis Date   Anxiety    Arthritis    Complication of anesthesia    Depression    Depression with suicidal ideation 01/21/2021   Dysphasia    GERD (gastroesophageal reflux disease)    Hyperlipidemia    Hypertension    Parkinson's disease    diagnosed at age 15   Parkinson's disease dementia ([place]) 07/31/2020   PONV (postoperative nausea and vomiting)    states it has been a long time since getting sick   Sleep apnea    Has CPAP but doesnt use   Stroke ([place])    TIA (transient ischemic attack)    not really TIA but abnormal MRI per pt left sided weakness   UTI (lower urinary tract infection)    Vitamin D deficiency     Past Surgical History:  Procedure Laterality Date   BACK SURGERY     Battery change     DBS, 12/2009   BIOPSY N/A  11/29/2013   Procedure: GASTRIC BIOPSY;  Surgeon: Danie Binder, MD;  Location: [facility];  Service: Endoscopy;  Laterality: N/A;   BURR HOLE W/ STEREOTACTIC INSERTION OF DBS LEADS / INTRAOP MICROELECTRODE RECORDING     2007   CARPAL TUNNEL RELEASE Right    COLONOSCOPY WITH PROPOFOL N/A 11/29/2013   Procedure: COLONOSCOPY WITH PROPOFOL;  Surgeon: Danie Binder, MD;  Location: [facility];  Service: Endoscopy;  Laterality: N/A;  entered cecum @ [phone]; total cecal withdrawal time 21 minutes    ELBOW SURGERY Left    after fx   ESOPHAGEAL DILATION N/A 11/29/2013   Procedure: ESOPHAGEAL DILATION;  Surgeon: Danie Binder, MD;  Location: [facility];  Service: Endoscopy;  Laterality: N/A;  [place] 14/15/16   ESOPHAGOGASTRODUODENOSCOPY (EGD) WITH PROPOFOL N/A 11/29/2013   Procedure: ESOPHAGOGASTRODUODENOSCOPY (EGD) WITH PROPOFOL;  Surgeon: Danie Binder, MD;  Location: [facility];  Service: Endoscopy;  Laterality: N/A;   FOOT SURGERY Left    "something with 2nd 2."   NECK SURGERY     Fusion   POLYPECTOMY N/A 11/29/2013   Procedure: POLYPECTOMY;  Surgeon: Danie Binder, MD;  Location: [facility];  Service: Endoscopy;  Laterality: N/A;  Distal Transverse Colon x2    PR ANALYZE NEUROSTIM BRAIN, FIRST 1H  10/15/2012       PULSE GENERATOR IMPLANT Right 03/30/2018   Procedure: Right Implantable pulse generator change;  Surgeon: Erline Levine, MD;  Location: [place];  Service: Neurosurgery;  Laterality: Right;   SUBTHALAMIC STIMULATOR BATTERY REPLACEMENT N/A 08/05/2013   Procedure: SUBTHALAMIC STIMULATOR BATTERY REPLACEMENT;  Surgeon: Erline Levine, MD;  Location: [place] [facility];  Service: Neurosurgery;  Laterality: N/A;   SUBTHALAMIC STIMULATOR BATTERY REPLACEMENT N/A 11/26/2015   Procedure: Implantable pulse generator battery change for deep brain stimulator;  Surgeon: Erline Levine, MD;  Location: [place] [facility];  Service: Neurosurgery;  Laterality: N/A;   SUBTHALAMIC STIMULATOR BATTERY REPLACEMENT Right 09/13/2020   Procedure: Right chest  implantable pulse generator change for depleted battery;  Surgeon: Erline Levine, MD;  Location: [place];  Service: Neurosurgery;  Laterality: Right;  3C/RM 18   VAGINAL HYSTERECTOMY     "partial"   WRIST SURGERY Right    after fx    Social History   Socioeconomic History   Marital status: Widowed    Spouse name: Not on file   Number of children: Not on file   Years of education: Not on file   Highest education level: Not on file  Occupational History   Occupation: Retired  Tobacco Use   Smoking status: Former    Packs/day: 0.50    Years: 25.00    Total pack years: 12.50    Types: Cigarettes    Quit date: 03/01/2019    Years since quitting: 3.1   Smokeless tobacco: Never  Vaping Use   Vaping Use: Never used  Substance and Sexual Activity   Alcohol use: Not Currently   Drug use: No   Sexual activity: Not on file  Other Topics Concern   Not on file  Social History Narrative   She resides at [facility] (assisted living facility).   Social Determinants of Health   Financial Resource Strain: Low Risk  (03/29/2019)   Overall Financial Resource Strain (CARDIA)    Difficulty of Paying Living Expenses: Not very hard  Food Insecurity: Not on file  Transportation Needs: No Transportation Needs (03/29/2019)   PRAPARE - Hydrologist (Medical): No    Lack of Transportation (Non-Medical): No  Physical Activity: Not on file  Stress: Not on file  Social Connections: Moderately Isolated (03/29/2019)   Social Connection and Isolation Panel [NHANES]    Frequency of Communication with Friends and Family: More than three times a week    Frequency of Social Gatherings with Friends and Family: Once a week    Attends Religious Services: 1 to 4 times per year    Active Member of [employer] or Organizations: No    Attends Archivist Meetings: Never    Marital Status: Widowed  Intimate Partner Violence: Unknown (03/29/2019)   Humiliation, Afraid, Rape, and Kick  questionnaire    Fear of Current or Ex-Partner: No    Emotionally Abused: Not on file    Physically Abused: Not on file    Sexually Abused: Not on file   Family History  Problem Relation Age of Onset   Colon cancer Paternal Uncle       VITAL SIGNS BP 137/72   Pulse 84   Temp 98.6 F (37 C)   Resp 20   Ht '5\' 2"'$  (1.575 m)   Wt 134 lb 9.6 oz (61.1 kg)  SpO2 97%   BMI 24.62 kg/m   Outpatient Encounter Medications as of 04/22/2022  Medication Sig   acetaminophen (TYLENOL) 500 MG tablet Take 500 mg by mouth in the morning, at noon, and at bedtime.   ALPRAZolam (XANAX) 0.25 MG tablet Take 1 tablet (0.25 mg total) by mouth 2 (two) times daily. Major depressive disorder, recurrent, unspecified   BREZTRI AEROSPHERE 160-9-4.8 MCG/ACT AERO Inhale 2 puffs into the lungs 2 (two) times daily.   Camphor-Menthol-Methyl Sal (SALONPAS) 3.05-31-08 % [facility] Special Instructions: apply one pack to both sides of lower back daily and remove and at HS. for bilateral lower back pain. [DX: Chronic pain syndrome-per NP note]   carbidopa-levodopa (SINEMET IR) 25-100 MG tablet Take 1 tablet by mouth 2 (two) times daily.   Cholecalciferol 50 MCG (2000 UT) CAPS Take 1 capsule by mouth daily.   clopidogrel (PLAVIX) 75 MG tablet Take 75 mg by mouth daily.   Dextromethorphan-quiNIDine (NUEDEXTA) 20-10 MG capsule Take 1 capsule by mouth 2 (two) times daily.   docusate sodium (COLACE) 100 MG capsule Take 100 mg by mouth daily.   donepezil (ARICEPT) 5 MG tablet Take 5 mg by mouth at bedtime. 9 pm for parkinson's dementia   DULoxetine (CYMBALTA) 60 MG capsule Take 60 mg by mouth daily. 9 am   losartan (COZAAR) 100 MG tablet Take 100 mg by mouth daily.   meloxicam (MOBIC) 15 MG tablet Take 15 mg by mouth daily.   NON FORMULARY Diet: : Dys 3 (chopped) diet, continue thin liquids; will allow pizza if requested  Liquids:Regular   Nutritional Supplements (ENSURE ENLIVE PO) Take by mouth. With meals   omeprazole  (PRILOSEC) 20 MG capsule Take 20 mg by mouth in the morning and at bedtime.    polyethylene glycol (MIRALAX / GLYCOLAX) 17 g packet Take 17 g by mouth daily as needed.   pramipexole (MIRAPEX) 0.5 MG tablet Take 0.5 mg by mouth at bedtime.   QUEtiapine (SEROQUEL) 25 MG tablet Take 12.5 mg by mouth daily.   traZODone (DESYREL) 50 MG tablet Take by mouth at bedtime.   No facility-administered encounter medications on file as of 04/22/2022.     SIGNIFICANT DIAGNOSTIC EXAMS  LABS REVIEWED; PREVIOUS    04-11-21: hgb a1c 5.5; tsh 1.431 free t4: 1.12 05-30-21: glucose 106; bun 26; creat 0.95; k+ 4.0; na++ 138; ca 9.6; GFR>60 07-12-21: wbc 7.5; hgb 15.5; hct 46.0; mcv 97.5 plt 252; glucose 112; bun 31; creat 0.98; k+ 4.0; na++ 138; ca 9.5 GFR>60  11-07-21: chol 134; ldl 66; trig 173 hdl 33; hepatitis C: nr  01-30-22; wbc 6.0; hgb 14.5; hct 42.1; mcv 96.3 plt 190; glucose 102; bun 21; creat 0.76; k+ 4.0; na++ 140; ca 9.3 gfr >60; protein 6.5; albumin 3.6 hgb a1c 5.2; tsh 1.695 02-24-22: wbc 6.7; hgb 15.6; hct 46.3; mcv 98.7 pt 214; glucose 132; bun 36; creat 1.01; k+ 4.3; na++ 141; ca 9.8 gfr 58 d dimer 0.42; crp 1.1   NO NEW LABS.    Review of Systems  Reason unable to perform ROS: unable to fully participate.   Physical Exam Constitutional:      General: She is not in acute distress.    Appearance: She is well-developed. She is not diaphoretic.  Neck:     Thyroid: No thyromegaly.  Cardiovascular:     Rate and Rhythm: Normal rate and regular rhythm.     Pulses: Normal pulses.     Heart sounds: Normal heart sounds.  Pulmonary:  Effort: Pulmonary effort is normal. No respiratory distress.     Breath sounds: Normal breath sounds.  Abdominal:     General: Bowel sounds are normal. There is no distension.     Palpations: Abdomen is soft.     Tenderness: There is no abdominal tenderness.  Musculoskeletal:     Cervical back: Neck supple.     Right lower leg: No edema.     Left lower leg: No  edema.     Comments: Has left side weakness Leans head to the left  Bilateral hand tremor  Has bilateral eyebrow movement Right arm shrug     Lymphadenopathy:     Cervical: No cervical adenopathy.  Skin:    General: Skin is warm and dry.  Neurological:     Mental Status: She is alert. Mental status is at baseline.  Psychiatric:        Mood and Affect: Mood normal.      ASSESSMENT/ PLAN:  TODAY  COPD without exacerbation: will continue breztri aerosphere 16-04.5 mcg 2 puffs daily   2. GERD without esophagitis: will continue prilosec 20 mg daily   3. Chronic constipation: will continue colace daily and miralax daily as needed  4. Chronic pain syndrome will continue cymbalta 60 mg daily (also takes for mood state) salon patches to back    PREVIOUS   5.  Parkinson's disease related dementia: weight is 134 pounds; is on aricept 5 mg daily   6. Essential hypertension: b/p 137/72 will continue  cozaar 100 mg daily   7. Parkinson's disease is stable is status post deep brain stimulator: will continue sinemet IR 25/100 mg twice daily and mirapex 0.5 mg nightly   8. OAB (over active bladder) is off ditropan; is completely incontinent  9. Mixed hyperlipidemia LDL 66 will continue lipitor 40 mg daily   10. PBA: will continue neudexta 10/20 mg twice daily   11. Severe major depression with psychotic features/depression with suicidal ideation: is on xanax 0.25 mg twice daily (has failed 2 weans) will continue cymbalta 60 mg daily trazodone 100 mg nightly seroquel 25 mg nightly unable to tolerate buspar      Ok Edwards NP [facility]  call [phone]

## 2022-05-08 ENCOUNTER — Encounter: Payer: Self-pay | Admitting: Internal Medicine

## 2022-05-08 ENCOUNTER — Non-Acute Institutional Stay (SKILLED_NURSING_FACILITY): Payer: Medicare Other | Admitting: Internal Medicine

## 2022-05-08 DIAGNOSIS — N182 Chronic kidney disease, stage 2 (mild): Secondary | ICD-10-CM

## 2022-05-08 DIAGNOSIS — E782 Mixed hyperlipidemia: Secondary | ICD-10-CM

## 2022-05-08 DIAGNOSIS — D751 Secondary polycythemia: Secondary | ICD-10-CM

## 2022-05-08 DIAGNOSIS — I10 Essential (primary) hypertension: Secondary | ICD-10-CM | POA: Diagnosis not present

## 2022-05-08 DIAGNOSIS — F02C2 Dementia in other diseases classified elsewhere, severe, with psychotic disturbance: Secondary | ICD-10-CM

## 2022-05-08 DIAGNOSIS — G20A1 Parkinson's disease without dyskinesia, without mention of fluctuations: Secondary | ICD-10-CM

## 2022-05-08 NOTE — Assessment & Plan Note (Signed)
Blood pressure adequately controlled on high-dose calcium channel blocker.  No change indicated.

## 2022-05-08 NOTE — Patient Instructions (Signed)
See assessment and plan under each diagnosis in the problem list and acutely for this visit 

## 2022-05-08 NOTE — Progress Notes (Signed)
   NURSING HOME LOCATION:  [facility] ROOM NUMBER: [place]:  Full Code  PCP:  Ok Edwards NP  This is a nursing facility follow up visit of chronic medical diagnoses & to document compliance with Regulation 483.30 (c) in The [place] Manual Phase 2 which mandates caregiver visit ( visits can alternate among physician, PA or NP as per statutes) within 10 days of 30 days / 60 days/ 90 days post admission to SNF date    Interim medical record and care since last SNF visit was updated with review of diagnostic studies and change in clinical status since last visit were documented.  HPI: She is a permanent resident of this facility with diagnoses of GERD, dyslipidemia, essential hypertension, Parkinson's disease complicated by dementia, OSA, history of stroke, vitamin D deficiency, and anxiety/depression. Significant surgeries and procedures include stereotactic insertion of DBS leads in 2007.  Labs are sufficiently current ;on 02/24/2022 creatinine was 1.01 with a GFR 58 indicating high stage IIIa CKD.  CBC revealed tendency toward polycythemia with H/H of 15.6/46.3.  Criteria for frank polycythemia were not met.  LDL and TSH are sufficiently current and therapeutic or at goal.  Review of systems could not be completed as she was essentially nonverbal.  Physical exam:  Pertinent or positive findings: She exhibits some tremor of the head as well as coarse tremor of both upper extremities and the left lower extremity.Facies can be described as almost to grimace.  Eyebrows are decreased in density.  Eyes are sunken.  She is missing many maxillary and mandibular teeth.  As noted she had no discernible vocalization.  She exhibits a slight gallop cadence with slurring.  1/2+ edema is noted at the sock line.  Pedal pulses are decreased.  There is a brace on the right lower extremity.  There is a large plaque-like ecchymotic appearing lesion over the left shin.   By palpation it is not raised.  Lymphatic: No lymphadenopathy about the head, neck, axilla. Eyes: No conjunctival inflammation or lid edema is present. There is no scleral icterus. Ears:  External ear exam shows no significant lesions or deformities.   Nose:  External nasal examination shows no deformity or inflammation. Nasal mucosa are pink and moist without lesions, exudates Neck:  No thyromegaly, masses, tenderness noted.    Heart:  No definite murmur, click, rub .  Lungs: without wheezes, rhonchi, rales, rubs. Abdomen: Bowel sounds are normal. Abdomen is soft and nontender with no organomegaly, hernias, masses. GU: Deferred  Extremities:  No cyanosis, clubbing  Neurologic exam :Balance, Rhomberg, finger to nose testing could not be completed due to clinical state Skin: Warm & dry w/o tenting.  See summary under each active problem in the Problem List with associated updated therapeutic plan

## 2022-05-08 NOTE — Assessment & Plan Note (Signed)
She was essentially totally nonverbal today.  She could follow simple commands.  [place] NP states that when she realizes the examiner is in the room her neurologic signs are worse than when observed without her knowledge.  She declines neurologic follow-up.

## 2022-05-08 NOTE — Assessment & Plan Note (Signed)
With a history of TIA and CVA LDL goal is less than 70.  LDL was at goal in June without statin therapy.  Annual monitor satisfactory.

## 2022-05-08 NOTE — Assessment & Plan Note (Signed)
Med list reviewed; there is no indication for change in medications or dosages unless there is progression of CKD.

## 2022-05-08 NOTE — Assessment & Plan Note (Signed)
02/24/2022 H/H 15.6/46.3, both slightly elevated but not indicative of frank polycythemia.  Continue to monitor.

## 2022-05-14 ENCOUNTER — Other Ambulatory Visit: Payer: Self-pay | Admitting: Adult Health

## 2022-05-14 MED ORDER — ALPRAZOLAM 0.25 MG PO TABS: 0.2500 mg | ORAL_TABLET | Freq: Two times a day (BID) | ORAL | 0 refills | Status: DC

## 2022-05-27 ENCOUNTER — Encounter: Payer: Self-pay | Admitting: Adult Health

## 2022-05-27 ENCOUNTER — Non-Acute Institutional Stay (SKILLED_NURSING_FACILITY): Payer: Medicare Other | Admitting: Adult Health

## 2022-05-27 DIAGNOSIS — M5416 Radiculopathy, lumbar region: Secondary | ICD-10-CM

## 2022-05-27 DIAGNOSIS — G894 Chronic pain syndrome: Secondary | ICD-10-CM | POA: Diagnosis not present

## 2022-05-27 NOTE — Progress Notes (Signed)
Location:  [facility] Room Number: [phone] Place of Service:  SNF (31)   CODE STATUS: DNR  Allergies  Allergen Reactions   Oxycodone Other (See Comments)    Can tolerate low dose mixed with tynlelol HALLUCINATIONS    Buspar [Buspirone] Other (See Comments)    Unknown   Docosahexaenoic Acid Other (See Comments)   Eicosapentaenoic Acid    Eicosapentaenoic Acid (Epa)    Lutein Nausea And Vomiting   Omega-3 Fatty Acids Other (See Comments)   Other    Aspirin Nausea And Vomiting and Other (See Comments)    Severe stomach pain    Codeine Nausea Only    Makes her feel very sick     Chief Complaint  Patient presents with   Acute Visit    Pain Management     HPI:  She is having difficulty with pain management. She is taking mobic 15 mg daily; tylenol 500 mg three times daily; salon pas #2 on back. She is having back pain with stiffness present. She is sleeping at night; no changes in appetite.   Past Medical History:  Diagnosis Date   Anxiety    Arthritis    Complication of anesthesia    Depression    Depression with suicidal ideation 01/21/2021   Dysphasia    GERD (gastroesophageal reflux disease)    Hyperlipidemia    Hypertension    Parkinson's disease    diagnosed at age 59   Parkinson's disease dementia ([place]) 07/31/2020   PONV (postoperative nausea and vomiting)    states it has been a long time since getting sick   Sleep apnea    Has CPAP but doesnt use   Stroke ([place])    TIA (transient ischemic attack)    not really TIA but abnormal MRI per pt left sided weakness   UTI (lower urinary tract infection)    Vitamin D deficiency     Past Surgical History:  Procedure Laterality Date   BACK SURGERY     Battery change     DBS, 12/2009   BIOPSY N/A 11/29/2013   Procedure: GASTRIC BIOPSY;  Surgeon: Danie Binder, MD;  Location: [facility];  Service: Endoscopy;  Laterality: N/A;   BURR HOLE W/ STEREOTACTIC INSERTION OF DBS LEADS / INTRAOP  MICROELECTRODE RECORDING     2007   CARPAL TUNNEL RELEASE Right    COLONOSCOPY WITH PROPOFOL N/A 11/29/2013   Procedure: COLONOSCOPY WITH PROPOFOL;  Surgeon: Danie Binder, MD;  Location: [facility];  Service: Endoscopy;  Laterality: N/A;  entered cecum @ [phone]; total cecal withdrawal time 21 minutes    ELBOW SURGERY Left    after fx   ESOPHAGEAL DILATION N/A 11/29/2013   Procedure: ESOPHAGEAL DILATION;  Surgeon: Danie Binder, MD;  Location: [facility];  Service: Endoscopy;  Laterality: N/A;  [place] 14/15/16   ESOPHAGOGASTRODUODENOSCOPY (EGD) WITH PROPOFOL N/A 11/29/2013   Procedure: ESOPHAGOGASTRODUODENOSCOPY (EGD) WITH PROPOFOL;  Surgeon: Danie Binder, MD;  Location: [facility];  Service: Endoscopy;  Laterality: N/A;   FOOT SURGERY Left    "something with 2nd 2."   NECK SURGERY     Fusion   POLYPECTOMY N/A 11/29/2013   Procedure: POLYPECTOMY;  Surgeon: Danie Binder, MD;  Location: [facility];  Service: Endoscopy;  Laterality: N/A;  Distal Transverse Colon x2    PR ANALYZE NEUROSTIM BRAIN, FIRST 1H  10/15/2012       PULSE GENERATOR IMPLANT Right 03/30/2018   Procedure: Right Implantable pulse generator  change;  Surgeon: Erline Levine, MD;  Location: [facility];  Service: Neurosurgery;  Laterality: Right;   SUBTHALAMIC STIMULATOR BATTERY REPLACEMENT N/A 08/05/2013   Procedure: SUBTHALAMIC STIMULATOR BATTERY REPLACEMENT;  Surgeon: Erline Levine, MD;  Location: [place] [facility];  Service: Neurosurgery;  Laterality: N/A;   SUBTHALAMIC STIMULATOR BATTERY REPLACEMENT N/A 11/26/2015   Procedure: Implantable pulse generator battery change for deep brain stimulator;  Surgeon: Erline Levine, MD;  Location: [place] [facility];  Service: Neurosurgery;  Laterality: N/A;   SUBTHALAMIC STIMULATOR BATTERY REPLACEMENT Right 09/13/2020   Procedure: Right chest implantable pulse generator change for depleted battery;  Surgeon: Erline Levine, MD;  Location: [place] [facility];  Service: Neurosurgery;  Laterality: Right;  3C/RM 18   VAGINAL HYSTERECTOMY      "partial"   WRIST SURGERY Right    after fx    Social History   Socioeconomic History   Marital status: Widowed    Spouse name: Not on file   Number of children: Not on file   Years of education: Not on file   Highest education level: Not on file  Occupational History   Occupation: Retired  Tobacco Use   Smoking status: Former    Packs/day: 0.50    Years: 25.00    Total pack years: 12.50    Types: Cigarettes    Quit date: 03/01/2019    Years since quitting: 3.2   Smokeless tobacco: Never  Vaping Use   Vaping Use: Never used  Substance and Sexual Activity   Alcohol use: Not Currently   Drug use: No   Sexual activity: Not on file  Other Topics Concern   Not on file  Social History Narrative   She resides at [facility] (assisted living facility).   Social Determinants of Health   Financial Resource Strain: Low Risk  (03/29/2019)   Overall Financial Resource Strain (CARDIA)    Difficulty of Paying Living Expenses: Not very hard  Food Insecurity: Not on file  Transportation Needs: No Transportation Needs (03/29/2019)   PRAPARE - Hydrologist (Medical): No    Lack of Transportation (Non-Medical): No  Physical Activity: Not on file  Stress: Not on file  Social Connections: Moderately Isolated (03/29/2019)   Social Connection and Isolation Panel [NHANES]    Frequency of Communication with Friends and Family: More than three times a week    Frequency of Social Gatherings with Friends and Family: Once a week    Attends Religious Services: 1 to 4 times per year    Active Member of [employer] or Organizations: No    Attends Archivist Meetings: Never    Marital Status: Widowed  Intimate Partner Violence: Unknown (03/29/2019)   Humiliation, Afraid, Rape, and Kick questionnaire    Fear of Current or Ex-Partner: No    Emotionally Abused: Not on file    Physically Abused: Not on file    Sexually Abused: Not on file   Family History  Problem  Relation Age of Onset   Colon cancer Paternal Uncle       VITAL SIGNS BP 134/77   Pulse 74   Temp 98.4 F (36.9 C)   Resp 20   Ht '5\' 2"'$  (1.575 m)   Wt 131 lb (59.4 kg)   SpO2 96%   BMI 23.96 kg/m   Outpatient Encounter Medications as of 05/27/2022  Medication Sig   acetaminophen (TYLENOL) 500 MG tablet Take 500 mg by mouth in the morning, at noon, and at bedtime.  ALPRAZolam (XANAX) 0.25 MG tablet Take 1 tablet (0.25 mg total) by mouth 2 (two) times daily. Major depressive disorder, recurrent, unspecified   BREZTRI AEROSPHERE 160-9-4.8 MCG/ACT AERO Inhale 2 puffs into the lungs 2 (two) times daily.   Camphor-Menthol-Methyl Sal (SALONPAS) 3.05-31-08 % [facility] Special Instructions: apply one pack to both sides of lower back daily and remove and at HS. for bilateral lower back pain. [DX: Chronic pain syndrome-per NP note]   carbidopa-levodopa (SINEMET IR) 25-100 MG tablet Take 1 tablet by mouth 2 (two) times daily.   Cholecalciferol 50 MCG (2000 UT) CAPS Take 1 capsule by mouth daily.   clopidogrel (PLAVIX) 75 MG tablet Take 75 mg by mouth daily.   Dextromethorphan-quiNIDine (NUEDEXTA) 20-10 MG capsule Take 1 capsule by mouth 2 (two) times daily.   docusate sodium (COLACE) 100 MG capsule Take 100 mg by mouth daily.   donepezil (ARICEPT) 5 MG tablet Take 5 mg by mouth at bedtime. 9 pm for parkinson's dementia   DULoxetine (CYMBALTA) 60 MG capsule Take 60 mg by mouth daily. 9 am   losartan (COZAAR) 100 MG tablet Take 100 mg by mouth daily.   meloxicam (MOBIC) 15 MG tablet Take 15 mg by mouth daily.   NON FORMULARY Diet: : Dys 3 (chopped) diet, continue thin liquids; will allow pizza if requested  Liquids:Regular   Nutritional Supplements (ENSURE ENLIVE PO) Take by mouth. With meals   omeprazole (PRILOSEC) 20 MG capsule Take 20 mg by mouth in the morning and at bedtime.    polyethylene glycol (MIRALAX / GLYCOLAX) 17 g packet Take 17 g by mouth daily.   pramipexole (MIRAPEX) 0.5 MG tablet  Take 0.5 mg by mouth at bedtime.   QUEtiapine (SEROQUEL) 25 MG tablet Take 12.5 mg by mouth daily.   traZODone (DESYREL) 50 MG tablet Take 25 mg by mouth at bedtime.   No facility-administered encounter medications on file as of 05/27/2022.     SIGNIFICANT DIAGNOSTIC EXAMS  LABS REVIEWED; PREVIOUS    05-30-21: glucose 106; bun 26; creat 0.95; k+ 4.0; na++ 138; ca 9.6; GFR>60 07-12-21: wbc 7.5; hgb 15.5; hct 46.0; mcv 97.5 plt 252; glucose 112; bun 31; creat 0.98; k+ 4.0; na++ 138; ca 9.5 GFR>60  11-07-21: chol 134; ldl 66; trig 173 hdl 33; hepatitis C: nr  01-30-22; wbc 6.0; hgb 14.5; hct 42.1; mcv 96.3 plt 190; glucose 102; bun 21; creat 0.76; k+ 4.0; na++ 140; ca 9.3 gfr >60; protein 6.5; albumin 3.6 hgb a1c 5.2; tsh 1.695 02-24-22: wbc 6.7; hgb 15.6; hct 46.3; mcv 98.7 pt 214; glucose 132; bun 36; creat 1.01; k+ 4.3; na++ 141; ca 9.8 gfr 58 d dimer 0.42; crp 1.1   NO NEW LABS.   Review of Systems  Reason unable to perform ROS: unable to fully participate.   Physical Exam Constitutional:      General: She is not in acute distress.    Appearance: She is well-developed. She is not diaphoretic.  Neck:     Thyroid: No thyromegaly.  Cardiovascular:     Rate and Rhythm: Normal rate and regular rhythm.     Pulses: Normal pulses.     Heart sounds: Normal heart sounds.  Pulmonary:     Effort: Pulmonary effort is normal. No respiratory distress.     Breath sounds: Normal breath sounds.  Abdominal:     General: Bowel sounds are normal. There is no distension.     Palpations: Abdomen is soft.     Tenderness: There is no  abdominal tenderness.  Musculoskeletal:     Cervical back: Neck supple.     Right lower leg: No edema.     Left lower leg: No edema.     Comments: Has left side weakness Leans head to the left  Bilateral hand tremor  Has bilateral eyebrow movement Right arm shrug     Lymphadenopathy:     Cervical: No cervical adenopathy.  Skin:    General: Skin is warm and dry.   Neurological:     Mental Status: She is alert. Mental status is at baseline.  Psychiatric:        Mood and Affect: Mood normal.      ASSESSMENT/ PLAN:  TODAY  Lumbar radiculopathy Chronic pain syndrome  Will change tylenol to 1 gm three times daily  Will have therapy see her for pain management.  Will monitor     Ok Edwards NP [facility]  call [phone]

## 2022-06-06 ENCOUNTER — Other Ambulatory Visit: Payer: Self-pay | Admitting: Adult Health

## 2022-06-06 MED ORDER — ALPRAZOLAM 0.25 MG PO TABS: 0.2500 mg | ORAL_TABLET | Freq: Two times a day (BID) | ORAL | 0 refills | Status: DC

## 2022-06-09 ENCOUNTER — Non-Acute Institutional Stay (SKILLED_NURSING_FACILITY): Payer: Medicare Other | Admitting: Adult Health

## 2022-06-09 ENCOUNTER — Encounter: Payer: Self-pay | Admitting: Adult Health

## 2022-06-09 DIAGNOSIS — G20A1 Parkinson's disease without dyskinesia, without mention of fluctuations: Secondary | ICD-10-CM | POA: Diagnosis not present

## 2022-06-09 DIAGNOSIS — G20B2 Parkinson's disease with dyskinesia, with fluctuations: Secondary | ICD-10-CM

## 2022-06-09 DIAGNOSIS — I10 Essential (primary) hypertension: Secondary | ICD-10-CM

## 2022-06-09 DIAGNOSIS — N3281 Overactive bladder: Secondary | ICD-10-CM

## 2022-06-09 DIAGNOSIS — F02C2 Dementia in other diseases classified elsewhere, severe, with psychotic disturbance: Secondary | ICD-10-CM

## 2022-06-09 NOTE — Progress Notes (Signed)
Location:  [place] Room Number: NO/108/W Place of Service:  SNF (31) Kinslie Hove S.,NP  CODE STATUS: DNR  Allergies  Allergen Reactions   Oxycodone Other (See Comments)    Can tolerate low dose mixed with tynlelol HALLUCINATIONS    Buspar [Buspirone] Other (See Comments)    Unknown   Docosahexaenoic Acid Other (See Comments)   Eicosapentaenoic Acid    Eicosapentaenoic Acid (Epa)    Lutein Nausea And Vomiting   Omega-3 Fatty Acids Other (See Comments)   Other    Aspirin Nausea And Vomiting and Other (See Comments)    Severe stomach pain    Codeine Nausea Only    Makes her feel very sick     Chief Complaint  Patient presents with   Medical Management of Chronic Issues                      Parkinson's disease related dementia: Essential hypertension: Parkinson's disease  OAB (over active bladder)     HPI:  She is a 77 year old long term resident of this facility being seen for the management of her chronic illnesses:  Parkinson's disease related dementia: Essential hypertension: Parkinson's disease  OAB (over active bladder). Her back pain is being managed since changing her tylenol. She has lost about 3 pounds in the past month.   Past Medical History:  Diagnosis Date   Anxiety    Arthritis    Complication of anesthesia    Depression    Depression with suicidal ideation 01/21/2021   Dysphasia    GERD (gastroesophageal reflux disease)    Hyperlipidemia    Hypertension    Parkinson's disease    diagnosed at age 72   Parkinson's disease dementia ([place]) 07/31/2020   PONV (postoperative nausea and vomiting)    states it has been a long time since getting sick   Sleep apnea    Has CPAP but doesnt use   Stroke ([place])    TIA (transient ischemic attack)    not really TIA but abnormal MRI per pt left sided weakness   UTI (lower urinary tract infection)    Vitamin D deficiency     Past Surgical History:  Procedure Laterality Date   BACK  SURGERY     Battery change     DBS, 12/2009   BIOPSY N/A 11/29/2013   Procedure: GASTRIC BIOPSY;  Surgeon: Danie Binder, MD;  Location: [facility];  Service: Endoscopy;  Laterality: N/A;   BURR HOLE W/ STEREOTACTIC INSERTION OF DBS LEADS / INTRAOP MICROELECTRODE RECORDING     2007   CARPAL TUNNEL RELEASE Right    COLONOSCOPY WITH PROPOFOL N/A 11/29/2013   Procedure: COLONOSCOPY WITH PROPOFOL;  Surgeon: Danie Binder, MD;  Location: [facility];  Service: Endoscopy;  Laterality: N/A;  entered cecum @ [phone]; total cecal withdrawal time 21 minutes    ELBOW SURGERY Left    after fx   ESOPHAGEAL DILATION N/A 11/29/2013   Procedure: ESOPHAGEAL DILATION;  Surgeon: Danie Binder, MD;  Location: [facility];  Service: Endoscopy;  Laterality: N/A;  [facility] 14/15/16   ESOPHAGOGASTRODUODENOSCOPY (EGD) WITH PROPOFOL N/A 11/29/2013   Procedure: ESOPHAGOGASTRODUODENOSCOPY (EGD) WITH PROPOFOL;  Surgeon: Danie Binder, MD;  Location: [facility];  Service: Endoscopy;  Laterality: N/A;   FOOT SURGERY Left    "something with 2nd 2."   NECK SURGERY     Fusion   POLYPECTOMY N/A 11/29/2013   Procedure: POLYPECTOMY;  Surgeon: Marga Melnick  Fields, MD;  Location: [facility];  Service: Endoscopy;  Laterality: N/A;  Distal Transverse Colon x2    PR ANALYZE NEUROSTIM BRAIN, FIRST 1H  10/15/2012       PULSE GENERATOR IMPLANT Right 03/30/2018   Procedure: Right Implantable pulse generator change;  Surgeon: Erline Levine, MD;  Location: [place];  Service: Neurosurgery;  Laterality: Right;   SUBTHALAMIC STIMULATOR BATTERY REPLACEMENT N/A 08/05/2013   Procedure: SUBTHALAMIC STIMULATOR BATTERY REPLACEMENT;  Surgeon: Erline Levine, MD;  Location: [place] [facility];  Service: Neurosurgery;  Laterality: N/A;   SUBTHALAMIC STIMULATOR BATTERY REPLACEMENT N/A 11/26/2015   Procedure: Implantable pulse generator battery change for deep brain stimulator;  Surgeon: Erline Levine, MD;  Location: [place] [facility];  Service: Neurosurgery;  Laterality: N/A;   SUBTHALAMIC STIMULATOR  BATTERY REPLACEMENT Right 09/13/2020   Procedure: Right chest implantable pulse generator change for depleted battery;  Surgeon: Erline Levine, MD;  Location: [place];  Service: Neurosurgery;  Laterality: Right;  3C/RM 18   VAGINAL HYSTERECTOMY     "partial"   WRIST SURGERY Right    after fx    Social History   Socioeconomic History   Marital status: Widowed    Spouse name: Not on file   Number of children: Not on file   Years of education: Not on file   Highest education level: Not on file  Occupational History   Occupation: Retired  Tobacco Use   Smoking status: Former    Packs/day: 0.50    Years: 25.00    Total pack years: 12.50    Types: Cigarettes    Quit date: 03/01/2019    Years since quitting: 3.2   Smokeless tobacco: Never  Vaping Use   Vaping Use: Never used  Substance and Sexual Activity   Alcohol use: Not Currently   Drug use: No   Sexual activity: Not on file  Other Topics Concern   Not on file  Social History Narrative   She resides at [facility] (assisted living facility).   Social Determinants of Health   Financial Resource Strain: Low Risk  (03/29/2019)   Overall Financial Resource Strain (CARDIA)    Difficulty of Paying Living Expenses: Not very hard  Food Insecurity: Not on file  Transportation Needs: No Transportation Needs (03/29/2019)   PRAPARE - Hydrologist (Medical): No    Lack of Transportation (Non-Medical): No  Physical Activity: Not on file  Stress: Not on file  Social Connections: Moderately Isolated (03/29/2019)   Social Connection and Isolation Panel [NHANES]    Frequency of Communication with Friends and Family: More than three times a week    Frequency of Social Gatherings with Friends and Family: Once a week    Attends Religious Services: 1 to 4 times per year    Active Member of [employer] or Organizations: No    Attends Archivist Meetings: Never    Marital Status: Widowed  Intimate Partner  Violence: Unknown (03/29/2019)   Humiliation, Afraid, Rape, and Kick questionnaire    Fear of Current or Ex-Partner: No    Emotionally Abused: Not on file    Physically Abused: Not on file    Sexually Abused: Not on file   Family History  Problem Relation Age of Onset   Colon cancer Paternal Uncle       VITAL SIGNS BP 136/64   Pulse 68   Temp 98.2 F (36.8 C)   Resp 20   Ht '5\' 2"'$  (1.575 m)  Wt 130 lb 11.2 oz (59.3 kg)   SpO2 95%   BMI 23.91 kg/m   Outpatient Encounter Medications as of 06/09/2022  Medication Sig   acetaminophen (TYLENOL) 500 MG tablet Take 500 mg by mouth in the morning, at noon, and at bedtime.   ALPRAZolam (XANAX) 0.25 MG tablet Take 1 tablet (0.25 mg total) by mouth 2 (two) times daily. Major depressive disorder, recurrent, unspecified   BREZTRI AEROSPHERE 160-9-4.8 MCG/ACT AERO Inhale 2 puffs into the lungs 2 (two) times daily.   Camphor-Menthol-Methyl Sal (SALONPAS) 3.05-31-08 % [facility] Special Instructions: apply one pack to both sides of lower back daily and remove and at HS. for bilateral lower back pain. [DX: Chronic pain syndrome-per NP note]   carbidopa-levodopa (SINEMET IR) 25-100 MG tablet Take 1 tablet by mouth 2 (two) times daily.   Cholecalciferol 50 MCG (2000 UT) CAPS Take 1 capsule by mouth daily.   clopidogrel (PLAVIX) 75 MG tablet Take 75 mg by mouth daily.   Dextromethorphan-quiNIDine (NUEDEXTA) 20-10 MG capsule Take 1 capsule by mouth 2 (two) times daily.   docusate sodium (COLACE) 100 MG capsule Take 100 mg by mouth daily.   donepezil (ARICEPT) 5 MG tablet Take 5 mg by mouth at bedtime. 9 pm for parkinson's dementia   DULoxetine (CYMBALTA) 60 MG capsule Take 60 mg by mouth daily. 9 am   losartan (COZAAR) 100 MG tablet Take 100 mg by mouth daily.   meloxicam (MOBIC) 15 MG tablet Take 15 mg by mouth daily.   NON FORMULARY Diet: : Dys 3 (chopped) diet, continue thin liquids; will allow pizza if requested  Liquids:Regular   Nutritional  Supplements (ENSURE ENLIVE PO) Take by mouth. With meals   omeprazole (PRILOSEC) 20 MG capsule Take 20 mg by mouth in the morning and at bedtime.    polyethylene glycol (MIRALAX / GLYCOLAX) 17 g packet Take 17 g by mouth daily.   pramipexole (MIRAPEX) 0.5 MG tablet Take 0.5 mg by mouth at bedtime.   QUEtiapine (SEROQUEL) 25 MG tablet Take 12.5 mg by mouth daily.   traZODone (DESYREL) 50 MG tablet Take 25 mg by mouth at bedtime.   No facility-administered encounter medications on file as of 06/09/2022.     SIGNIFICANT DIAGNOSTIC EXAMS  LABS REVIEWED; PREVIOUS    05-30-21: glucose 106; bun 26; creat 0.95; k+ 4.0; na++ 138; ca 9.6; GFR>60 07-12-21: wbc 7.5; hgb 15.5; hct 46.0; mcv 97.5 plt 252; glucose 112; bun 31; creat 0.98; k+ 4.0; na++ 138; ca 9.5 GFR>60  11-07-21: chol 134; ldl 66; trig 173 hdl 33; hepatitis C: nr  01-30-22; wbc 6.0; hgb 14.5; hct 42.1; mcv 96.3 plt 190; glucose 102; bun 21; creat 0.76; k+ 4.0; na++ 140; ca 9.3 gfr >60; protein 6.5; albumin 3.6 hgb a1c 5.2; tsh 1.695 02-24-22: wbc 6.7; hgb 15.6; hct 46.3; mcv 98.7 pt 214; glucose 132; bun 36; creat 1.01; k+ 4.3; na++ 141; ca 9.8 gfr 58 d dimer 0.42; crp 1.1   NO NEW LABS.   Review of Systems  Reason unable to perform ROS: unable to participate.   Physical Exam Constitutional:      General: She is not in acute distress.    Appearance: She is well-developed. She is not diaphoretic.  Neck:     Thyroid: No thyromegaly.  Cardiovascular:     Rate and Rhythm: Normal rate and regular rhythm.     Pulses: Normal pulses.     Heart sounds: Normal heart sounds.  Pulmonary:  Effort: Pulmonary effort is normal. No respiratory distress.     Breath sounds: Normal breath sounds.  Abdominal:     General: Bowel sounds are normal. There is no distension.     Palpations: Abdomen is soft.     Tenderness: There is no abdominal tenderness.  Musculoskeletal:     Cervical back: Neck supple.     Right lower leg: No edema.     Left  lower leg: No edema.     Comments:  Has left side weakness Leans head to the left  Bilateral hand tremor  Has bilateral eyebrow movement Right arm shrug     Lymphadenopathy:     Cervical: No cervical adenopathy.  Skin:    General: Skin is warm and dry.  Neurological:     Mental Status: She is alert. Mental status is at baseline.  Psychiatric:        Mood and Affect: Mood normal.     ASSESSMENT/ PLAN:  TODAY  Parkinson's disease related dementia: weight is 130 pounds; has lost a few pounds over the past month: will continue aricept 5 mg daily   2. Essential hypertension: b/p 136/64 will continue cozaar 100 mg daily   3. Parkinson's disease is stable is status post deep brain stimulator will continue sinemet IR 25/100 mg twice daily and mirapex 0.5 mg nightly   4. OAB (over active bladder) is off ditropan is completely incontinent of bladder.    PREVIOUS   5. Mixed hyperlipidemia LDL 66 will continue lipitor 40 mg daily   6. PBA: will continue neudexta 10/20 mg twice daily   7. 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 COPD without exacerbation: will continue breztri aerosphere 16-04.5 mcg 2 puffs daily   9. GERD without esophagitis: will continue prilosec 20 mg daily   10. Chronic constipation: will continue colace daily and miralax daily as needed  11. Chronic pain syndrome will continue cymbalta 60 mg daily (also takes for mood state) salon patches to back; tylenol 500 mg three times daily     Ok Edwards NP [facility]   call [phone]

## 2022-07-07 ENCOUNTER — Other Ambulatory Visit: Payer: Self-pay | Admitting: Adult Health

## 2022-07-07 MED ORDER — ALPRAZOLAM 0.25 MG PO TABS: 0.2500 mg | ORAL_TABLET | Freq: Two times a day (BID) | ORAL | 0 refills | Status: DC

## 2022-07-10 ENCOUNTER — Encounter: Payer: Self-pay | Admitting: Adult Health

## 2022-07-10 ENCOUNTER — Non-Acute Institutional Stay (SKILLED_NURSING_FACILITY): Payer: Medicare Other | Admitting: Adult Health

## 2022-07-10 DIAGNOSIS — F323 Major depressive disorder, single episode, severe with psychotic features: Secondary | ICD-10-CM

## 2022-07-10 DIAGNOSIS — Z66 Do not resuscitate: Secondary | ICD-10-CM | POA: Diagnosis not present

## 2022-07-10 DIAGNOSIS — F482 Pseudobulbar affect: Secondary | ICD-10-CM

## 2022-07-10 DIAGNOSIS — E782 Mixed hyperlipidemia: Secondary | ICD-10-CM

## 2022-07-10 DIAGNOSIS — R45851 Suicidal ideations: Secondary | ICD-10-CM

## 2022-07-10 DIAGNOSIS — F32A Depression, unspecified: Secondary | ICD-10-CM | POA: Diagnosis not present

## 2022-07-10 NOTE — Progress Notes (Signed)
Location:  [place] Room Number: [employer] Service:  SNF (31)   CODE STATUS: DNR  Allergies  Allergen Reactions   Oxycodone Other (See Comments)    Can tolerate low dose mixed with tynlelol HALLUCINATIONS    Buspar [Buspirone] Other (See Comments)    Unknown   Docosahexaenoic Acid Other (See Comments)   Eicosapentaenoic Acid    Eicosapentaenoic Acid (Epa)    Lutein Nausea And Vomiting   Omega-3 Fatty Acids Other (See Comments)   Other    Aspirin Nausea And Vomiting and Other (See Comments)    Severe stomach pain    Codeine Nausea Only    Makes her feel very sick     Chief Complaint  Patient presents with   Medical Management of Chronic Issues                   Mixed hyperlipidemia:  PBA:  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 There are no reports of uncontrolled pain; no reports of anxiety or depressive thoughts.   Past Medical History:  Diagnosis Date   Anxiety    Arthritis    Complication of anesthesia    Depression    Depression with suicidal ideation 01/21/2021   Dysphasia    GERD (gastroesophageal reflux disease)    Hyperlipidemia    Hypertension    Parkinson's disease    diagnosed at age 48   Parkinson's disease dementia ([place]) 07/31/2020   PONV (postoperative nausea and vomiting)    states it has been a long time since getting sick   Sleep apnea    Has CPAP but doesnt use   Stroke ([place])    TIA (transient ischemic attack)    not really TIA but abnormal MRI per pt left sided weakness   UTI (lower urinary tract infection)    Vitamin D deficiency     Past Surgical History:  Procedure Laterality Date   BACK SURGERY     Battery change     DBS,  12/2009   BIOPSY N/A 11/29/2013   Procedure: GASTRIC BIOPSY;  Surgeon: Danie Binder, MD;  Location: [facility];  Service: Endoscopy;  Laterality: N/A;   BURR HOLE W/ STEREOTACTIC INSERTION OF DBS LEADS / INTRAOP MICROELECTRODE RECORDING     2007   CARPAL TUNNEL RELEASE Right    COLONOSCOPY WITH PROPOFOL N/A 11/29/2013   Procedure: COLONOSCOPY WITH PROPOFOL;  Surgeon: Danie Binder, MD;  Location: [facility];  Service: Endoscopy;  Laterality: N/A;  entered cecum @ [phone]; total cecal withdrawal time 21 minutes    ELBOW SURGERY Left    after fx   ESOPHAGEAL DILATION N/A 11/29/2013   Procedure: ESOPHAGEAL DILATION;  Surgeon: Danie Binder, MD;  Location: [facility];  Service: Endoscopy;  Laterality: N/A;  [place] 14/15/16   ESOPHAGOGASTRODUODENOSCOPY (EGD) WITH PROPOFOL N/A 11/29/2013   Procedure: ESOPHAGOGASTRODUODENOSCOPY (EGD) WITH PROPOFOL;  Surgeon: Danie Binder, MD;  Location: [facility];  Service: Endoscopy;  Laterality: N/A;   FOOT SURGERY Left    "something with 2nd 2."   NECK SURGERY     Fusion   POLYPECTOMY N/A 11/29/2013   Procedure: POLYPECTOMY;  Surgeon: Danie Binder, MD;  Location: [facility];  Service:  Endoscopy;  Laterality: N/A;  Distal Transverse Colon x2    PR ANALYZE NEUROSTIM BRAIN, FIRST 1H  10/15/2012       PULSE GENERATOR IMPLANT Right 03/30/2018   Procedure: Right Implantable pulse generator change;  Surgeon: Erline Levine, MD;  Location: [place];  Service: Neurosurgery;  Laterality: Right;   SUBTHALAMIC STIMULATOR BATTERY REPLACEMENT N/A 08/05/2013   Procedure: SUBTHALAMIC STIMULATOR BATTERY REPLACEMENT;  Surgeon: Erline Levine, MD;  Location: [place] [facility];  Service: Neurosurgery;  Laterality: N/A;   SUBTHALAMIC STIMULATOR BATTERY REPLACEMENT N/A 11/26/2015   Procedure: Implantable pulse generator battery change for deep brain stimulator;  Surgeon: Erline Levine, MD;  Location: [place] [facility];  Service: Neurosurgery;  Laterality: N/A;   SUBTHALAMIC STIMULATOR BATTERY REPLACEMENT Right 09/13/2020    Procedure: Right chest implantable pulse generator change for depleted battery;  Surgeon: Erline Levine, MD;  Location: [place];  Service: Neurosurgery;  Laterality: Right;  3C/RM 18   VAGINAL HYSTERECTOMY     "partial"   WRIST SURGERY Right    after fx    Social History   Socioeconomic History   Marital status: Widowed    Spouse name: Not on file   Number of children: Not on file   Years of education: Not on file   Highest education level: Not on file  Occupational History   Occupation: Retired  Tobacco Use   Smoking status: Former    Packs/day: 0.50    Years: 25.00    Total pack years: 12.50    Types: Cigarettes    Quit date: 03/01/2019    Years since quitting: 3.3   Smokeless tobacco: Never  Vaping Use   Vaping Use: Never used  Substance and Sexual Activity   Alcohol use: Not Currently   Drug use: No   Sexual activity: Not on file  Other Topics Concern   Not on file  Social History Narrative   She resides at [facility] (assisted living facility).   Social Determinants of Health   Financial Resource Strain: Low Risk  (03/29/2019)   Overall Financial Resource Strain (CARDIA)    Difficulty of Paying Living Expenses: Not very hard  Food Insecurity: Not on file  Transportation Needs: No Transportation Needs (03/29/2019)   PRAPARE - Hydrologist (Medical): No    Lack of Transportation (Non-Medical): No  Physical Activity: Not on file  Stress: Not on file  Social Connections: Moderately Isolated (03/29/2019)   Social Connection and Isolation Panel [NHANES]    Frequency of Communication with Friends and Family: More than three times a week    Frequency of Social Gatherings with Friends and Family: Once a week    Attends Religious Services: 1 to 4 times per year    Active Member of [employer] or Organizations: No    Attends Archivist Meetings: Never    Marital Status: Widowed  Intimate Partner Violence: Unknown (03/29/2019)   Humiliation,  Afraid, Rape, and Kick questionnaire    Fear of Current or Ex-Partner: No    Emotionally Abused: Not on file    Physically Abused: Not on file    Sexually Abused: Not on file   Family History  Problem Relation Age of Onset   Colon cancer Paternal Uncle       VITAL SIGNS BP 126/88   Pulse 68   Ht 5' 2"$  (1.575 m)   Wt 122 lb 9.6 oz (55.6 kg)   BMI 22.42 kg/m   Outpatient Encounter Medications as of  07/10/2022  Medication Sig   acetaminophen (TYLENOL) 500 MG tablet Take 500 mg by mouth in the morning, at noon, and at bedtime.   ALPRAZolam (XANAX) 0.25 MG tablet Take 0.25 mg by mouth daily. At 4 pm   BREZTRI AEROSPHERE 160-9-4.8 MCG/ACT AERO Inhale 2 puffs into the lungs 2 (two) times daily.   Camphor-Menthol-Methyl Sal (SALONPAS) 3.05-31-08 % [facility] Special Instructions: apply one pack to both sides of lower back daily and remove and at HS. for bilateral lower back pain. [DX: Chronic pain syndrome-per NP note]   carbidopa-levodopa (SINEMET IR) 25-100 MG tablet Take 1 tablet by mouth 2 (two) times daily.   Cholecalciferol 50 MCG (2000 UT) CAPS Take 1 capsule by mouth daily.   clopidogrel (PLAVIX) 75 MG tablet Take 75 mg by mouth daily.   Dextromethorphan-quiNIDine (NUEDEXTA) 20-10 MG capsule Take 1 capsule by mouth 2 (two) times daily.   docusate sodium (COLACE) 100 MG capsule Take 100 mg by mouth daily.   donepezil (ARICEPT) 5 MG tablet Take 5 mg by mouth at bedtime. 9 pm for parkinson's dementia   DULoxetine (CYMBALTA) 60 MG capsule Take 60 mg by mouth daily. 9 am   losartan (COZAAR) 100 MG tablet Take 100 mg by mouth daily.   meloxicam (MOBIC) 15 MG tablet Take 15 mg by mouth daily.   NON FORMULARY Diet: : Dys 3 (chopped) diet, continue thin liquids; will allow pizza if requested  Liquids:Regular   Nutritional Supplements (ENSURE ENLIVE PO) Take by mouth. With meals   omeprazole (PRILOSEC) 20 MG capsule Take 20 mg by mouth in the morning and at bedtime.    polyethylene glycol  (MIRALAX / GLYCOLAX) 17 g packet Take 17 g by mouth daily.   pramipexole (MIRAPEX) 0.5 MG tablet Take 0.5 mg by mouth at bedtime.   QUEtiapine (SEROQUEL) 25 MG tablet Take 12.5 mg by mouth daily.   traZODone (DESYREL) 50 MG tablet Take 25 mg by mouth at bedtime.   [DISCONTINUED] ALPRAZolam (XANAX) 0.25 MG tablet Take 1 tablet (0.25 mg total) by mouth 2 (two) times daily. Major depressive disorder, recurrent, unspecified (Patient not taking: Reported on 07/10/2022)   No facility-administered encounter medications on file as of 07/10/2022.     SIGNIFICANT DIAGNOSTIC EXAMS  LABS REVIEWED; PREVIOUS    07-12-21: wbc 7.5; hgb 15.5; hct 46.0; mcv 97.5 plt 252; glucose 112; bun 31; creat 0.98; k+ 4.0; na++ 138; ca 9.5 GFR>60  11-07-21: chol 134; ldl 66; trig 173 hdl 33; hepatitis C: nr  01-30-22; wbc 6.0; hgb 14.5; hct 42.1; mcv 96.3 plt 190; glucose 102; bun 21; creat 0.76; k+ 4.0; na++ 140; ca 9.3 gfr >60; protein 6.5; albumin 3.6 hgb a1c 5.2; tsh 1.695 02-24-22: wbc 6.7; hgb 15.6; hct 46.3; mcv 98.7 pt 214; glucose 132; bun 36; creat 1.01; k+ 4.3; na++ 141; ca 9.8 gfr 58 d dimer 0.42; crp 1.1   NO NEW LABS.   Review of Systems  Reason unable to perform ROS: unable to fully participate.   Physical Exam Constitutional:      General: She is not in acute distress.    Appearance: She is well-developed. She is not diaphoretic.  Neck:     Thyroid: No thyromegaly.  Cardiovascular:     Rate and Rhythm: Normal rate and regular rhythm.     Pulses: Normal pulses.     Heart sounds: Normal heart sounds.  Pulmonary:     Effort: Pulmonary effort is normal. No respiratory distress.     Breath  sounds: Normal breath sounds.  Abdominal:     General: Bowel sounds are normal. There is no distension.     Palpations: Abdomen is soft.     Tenderness: There is no abdominal tenderness.  Musculoskeletal:     Cervical back: Neck supple.     Right lower leg: No edema.     Left lower leg: No edema.     Comments:   Has left side weakness Leans head to the left  Bilateral hand tremor  Has bilateral eyebrow movement Right arm shrug     Lymphadenopathy:     Cervical: No cervical adenopathy.  Skin:    General: Skin is warm and dry.  Neurological:     Mental Status: She is alert. Mental status is at baseline.  Psychiatric:        Mood and Affect: Mood normal.     ASSESSMENT/ PLAN:  TODAY  Mixed hyperlipidemia: ldl 66 will continue lipitor 40 mg daily   2. PBA: will continue neudexta 10/20 mg twice daily   3. 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 COPD without exacerbation: will continue breztri aerosphere 16-04.5 mcg 2 puffs daily   5. GERD without esophagitis: will continue prilosec 20 mg daily   6. Chronic constipation: will continue colace daily and miralax daily as needed  7. Chronic pain syndrome will continue cymbalta 60 mg daily (also takes for mood state) salon patches to back; tylenol 500 mg three times daily    8. Parkinson's disease related dementia: weight is 122 pounds; has lost a few pounds over the past month: will continue aricept 5 mg daily   9.  Essential hypertension: b/p 126/88 will continue cozaar 100 mg daily   10. Parkinson's disease is stable is status post deep brain stimulator will continue sinemet IR 25/100 mg twice daily and mirapex 0.5 mg nightly   4. OAB (over active bladder) is off ditropan is completely incontinent of bladder.     Ok Edwards NP [facility] Adult Medicine  call [phone]

## 2022-07-11 ENCOUNTER — Non-Acute Institutional Stay (SKILLED_NURSING_FACILITY): Payer: Medicare Other | Admitting: Adult Health

## 2022-07-11 ENCOUNTER — Encounter: Payer: Self-pay | Admitting: Adult Health

## 2022-07-11 DIAGNOSIS — J449 Chronic obstructive pulmonary disease, unspecified: Secondary | ICD-10-CM | POA: Diagnosis not present

## 2022-07-11 DIAGNOSIS — G20B2 Parkinson's disease with dyskinesia, with fluctuations: Secondary | ICD-10-CM | POA: Diagnosis not present

## 2022-07-11 DIAGNOSIS — G20A1 Parkinson's disease without dyskinesia, without mention of fluctuations: Secondary | ICD-10-CM | POA: Diagnosis not present

## 2022-07-11 DIAGNOSIS — F02C2 Dementia in other diseases classified elsewhere, severe, with psychotic disturbance: Secondary | ICD-10-CM | POA: Diagnosis not present

## 2022-07-11 NOTE — Progress Notes (Signed)
Location:  [facility] Room Number: NO/108/W Place of Service:  SNF (31)   CODE STATUS: dnr   Allergies  Allergen Reactions   Oxycodone Other (See Comments)    Can tolerate low dose mixed with tynlelol HALLUCINATIONS    Buspar [Buspirone] Other (See Comments)    Unknown   Docosahexaenoic Acid Other (See Comments)   Eicosapentaenoic Acid    Eicosapentaenoic Acid (Epa)    Lutein Nausea And Vomiting   Omega-3 Fatty Acids Other (See Comments)   Other    Aspirin Nausea And Vomiting and Other (See Comments)    Severe stomach pain    Codeine Nausea Only    Makes her feel very sick     Chief Complaint  Patient presents with   Acute Visit    Care plan meeting     HPI:  We have come together for her care plan meeting. Guardian present  BIMS 11/15 mood: 8/30: depression; trouble sleeping; nervous at times. She is nonambulatory with no falls. She requires max assist with her adls. She is incontinent of bladder and bowel. Dietary: weigh tis 112.6 pounds she requires supervision for her meals. She is on a D2 diet supervision with meals  weight 122.7 pounds has lost 12 pounds in the past quarter; appetite 25-75% is on ensure three times daily; will change to medpass with medications three times daily. Therapy: none at this time . Activities in room. 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  Past Medical History:  Diagnosis Date   Anxiety    Arthritis    Complication of anesthesia    Depression    Depression with suicidal ideation 01/21/2021   Dysphasia    GERD (gastroesophageal reflux disease)    Hyperlipidemia    Hypertension    Parkinson's disease    diagnosed at age 58   Parkinson's disease dementia ([place]) 07/31/2020   PONV (postoperative nausea and vomiting)     states it has been a long time since getting sick   Sleep apnea    Has CPAP but doesnt use   Stroke ([place])    TIA (transient ischemic attack)    not really TIA but abnormal MRI per pt left sided weakness   UTI (lower urinary tract infection)    Vitamin D deficiency     Past Surgical History:  Procedure Laterality Date   BACK SURGERY     Battery change     DBS, 12/2009   BIOPSY N/A 11/29/2013   Procedure: GASTRIC BIOPSY;  Surgeon: Danie Binder, MD;  Location: [facility];  Service: Endoscopy;  Laterality: N/A;   BURR HOLE W/ STEREOTACTIC INSERTION OF DBS LEADS / INTRAOP MICROELECTRODE RECORDING     2007   CARPAL TUNNEL RELEASE Right    COLONOSCOPY WITH PROPOFOL N/A 11/29/2013   Procedure: COLONOSCOPY WITH PROPOFOL;  Surgeon: Danie Binder, MD;  Location: [facility];  Service: Endoscopy;  Laterality: N/A;  entered cecum @ [phone]; total cecal withdrawal time 21 minutes    ELBOW SURGERY Left    after fx   ESOPHAGEAL DILATION N/A 11/29/2013   Procedure: ESOPHAGEAL DILATION;  Surgeon: Danie Binder, MD;  Location: [facility];  Service: Endoscopy;  Laterality: N/A;  [place] 14/15/16   ESOPHAGOGASTRODUODENOSCOPY (EGD) WITH PROPOFOL N/A 11/29/2013   Procedure: ESOPHAGOGASTRODUODENOSCOPY (EGD) WITH PROPOFOL;  Surgeon: Carlyon Prows  Rexene Edison, MD;  Location: [facility];  Service: Endoscopy;  Laterality: N/A;   FOOT SURGERY Left    "something with 2nd 2."   NECK SURGERY     Fusion   POLYPECTOMY N/A 11/29/2013   Procedure: POLYPECTOMY;  Surgeon: Danie Binder, MD;  Location: [facility];  Service: Endoscopy;  Laterality: N/A;  Distal Transverse Colon x2    PR ANALYZE NEUROSTIM BRAIN, FIRST 1H  10/15/2012       PULSE GENERATOR IMPLANT Right 03/30/2018   Procedure: Right Implantable pulse generator change;  Surgeon: Erline Levine, MD;  Location: [facility];  Service: Neurosurgery;  Laterality: Right;   SUBTHALAMIC STIMULATOR BATTERY REPLACEMENT N/A 08/05/2013   Procedure: SUBTHALAMIC STIMULATOR BATTERY REPLACEMENT;  Surgeon: Erline Levine,  MD;  Location: [facility];  Service: Neurosurgery;  Laterality: N/A;   SUBTHALAMIC STIMULATOR BATTERY REPLACEMENT N/A 11/26/2015   Procedure: Implantable pulse generator battery change for deep brain stimulator;  Surgeon: Erline Levine, MD;  Location: [facility];  Service: Neurosurgery;  Laterality: N/A;   SUBTHALAMIC STIMULATOR BATTERY REPLACEMENT Right 09/13/2020   Procedure: Right chest implantable pulse generator change for depleted battery;  Surgeon: Erline Levine, MD;  Location: [facility];  Service: Neurosurgery;  Laterality: Right;  3C/RM 18   VAGINAL HYSTERECTOMY     "partial"   WRIST SURGERY Right    after fx    Social History   Socioeconomic History   Marital status: Widowed    Spouse name: Not on file   Number of children: Not on file   Years of education: Not on file   Highest education level: Not on file  Occupational History   Occupation: Retired  Tobacco Use   Smoking status: Former    Packs/day: 0.50    Years: 25.00    Total pack years: 12.50    Types: Cigarettes    Quit date: 03/01/2019    Years since quitting: 3.3   Smokeless tobacco: Never  Vaping Use   Vaping Use: Never used  Substance and Sexual Activity   Alcohol use: Not Currently   Drug use: No   Sexual activity: Not on file  Other Topics Concern   Not on file  Social History Narrative   She resides at [facility] (assisted living facility).   Social Determinants of Health   Financial Resource Strain: Low Risk  (03/29/2019)   Overall Financial Resource Strain (CARDIA)    Difficulty of Paying Living Expenses: Not very hard  Food Insecurity: Not on file  Transportation Needs: No Transportation Needs (03/29/2019)   PRAPARE - Hydrologist (Medical): No    Lack of Transportation (Non-Medical): No  Physical Activity: Not on file  Stress: Not on file  Social Connections: Moderately Isolated (03/29/2019)   Social Connection and Isolation Panel [NHANES]    Frequency of  Communication with Friends and Family: More than three times a week    Frequency of Social Gatherings with Friends and Family: Once a week    Attends Religious Services: 1 to 4 times per year    Active Member of [employer] or Organizations: No    Attends Archivist Meetings: Never    Marital Status: Widowed  Intimate Partner Violence: Unknown (03/29/2019)   Humiliation, Afraid, Rape, and Kick questionnaire    Fear of Current or Ex-Partner: No    Emotionally Abused: Not on file    Physically Abused: Not on file    Sexually Abused: Not on file   Family  History  Problem Relation Age of Onset   Colon cancer Paternal Uncle       VITAL SIGNS BP 126/88   Pulse 68   Temp 97.6 F (36.4 C)   Resp (!) 21   Ht 5' 2"$  (1.575 m)   Wt 122 lb 9.6 oz (55.6 kg)   SpO2 94%   BMI 22.42 kg/m   Outpatient Encounter Medications as of 07/11/2022  Medication Sig   acetaminophen (TYLENOL) 500 MG tablet Take 500 mg by mouth in the morning, at noon, and at bedtime.   ALPRAZolam (XANAX) 0.25 MG tablet Take 0.25 mg by mouth daily. At 4 pm   BREZTRI AEROSPHERE 160-9-4.8 MCG/ACT AERO Inhale 2 puffs into the lungs 2 (two) times daily.   Camphor-Menthol-Methyl Sal (SALONPAS) 3.05-31-08 % [facility] Special Instructions: apply one pack to both sides of lower back daily and remove and at HS. for bilateral lower back pain. [DX: Chronic pain syndrome-per NP note]   carbidopa-levodopa (SINEMET IR) 25-100 MG tablet Take 1 tablet by mouth 2 (two) times daily.   Cholecalciferol 50 MCG (2000 UT) CAPS Take 1 capsule by mouth daily.   clopidogrel (PLAVIX) 75 MG tablet Take 75 mg by mouth daily.   Dextromethorphan-quiNIDine (NUEDEXTA) 20-10 MG capsule Take 1 capsule by mouth 2 (two) times daily.   docusate sodium (COLACE) 100 MG capsule Take 100 mg by mouth daily.   donepezil (ARICEPT) 5 MG tablet Take 5 mg by mouth at bedtime. 9 pm for parkinson's dementia   DULoxetine (CYMBALTA) 60 MG capsule Take 60 mg by mouth daily. 9  am   losartan (COZAAR) 100 MG tablet Take 100 mg by mouth daily.   meloxicam (MOBIC) 15 MG tablet Take 15 mg by mouth daily.   NON FORMULARY Diet: : Dys 3 (chopped) diet, continue thin liquids; will allow pizza if requested  Liquids:Regular   Nutritional Supplements (ENSURE ENLIVE PO) Take by mouth. With meals   omeprazole (PRILOSEC) 20 MG capsule Take 20 mg by mouth in the morning and at bedtime.    polyethylene glycol (MIRALAX / GLYCOLAX) 17 g packet Take 17 g by mouth daily.   pramipexole (MIRAPEX) 0.5 MG tablet Take 0.5 mg by mouth at bedtime.   QUEtiapine (SEROQUEL) 25 MG tablet Take 12.5 mg by mouth daily.   traZODone (DESYREL) 50 MG tablet Take 25 mg by mouth at bedtime.   No facility-administered encounter medications on file as of 07/11/2022.     SIGNIFICANT DIAGNOSTIC EXAMS  LABS REVIEWED; PREVIOUS   07-12-21: wbc 7.5; hgb 15.5; hct 46.0; mcv 97.5 plt 252; glucose 112; bun 31; creat 0.98; k+ 4.0; na++ 138; ca 9.5 GFR>60  11-07-21: chol 134; ldl 66; trig 173 hdl 33; hepatitis C: nr  01-30-22; wbc 6.0; hgb 14.5; hct 42.1; mcv 96.3 plt 190; glucose 102; bun 21; creat 0.76; k+ 4.0; na++ 140; ca 9.3 gfr >60; protein 6.5; albumin 3.6 hgb a1c 5.2; tsh 1.695 02-24-22: wbc 6.7; hgb 15.6; hct 46.3; mcv 98.7 pt 214; glucose 132; bun 36; creat 1.01; k+ 4.3; na++ 141; ca 9.8 gfr 58 d dimer 0.42; crp 1.1   NO NEW LABS.   Review of Systems  Reason unable to perform ROS: unable to fully participate.   Physical Exam Constitutional:      General: She is not in acute distress.    Appearance: She is well-developed. She is not diaphoretic.  Neck:     Thyroid: No thyromegaly.  Cardiovascular:     Rate and Rhythm: Normal  rate and regular rhythm.     Pulses: Normal pulses.     Heart sounds: Normal heart sounds.  Pulmonary:     Effort: Pulmonary effort is normal. No respiratory distress.     Breath sounds: Normal breath sounds.  Abdominal:     General: Bowel sounds are normal. There is no  distension.     Palpations: Abdomen is soft.     Tenderness: There is no abdominal tenderness.  Musculoskeletal:     Cervical back: Neck supple.     Right lower leg: No edema.     Left lower leg: No edema.     Comments: Has left side weakness Leans head to the left  Bilateral hand tremor  Has bilateral eyebrow movement Right arm shrug   Lymphadenopathy:     Cervical: No cervical adenopathy.  Skin:    General: Skin is warm and dry.  Neurological:     Mental Status: She is alert. Mental status is at baseline.  Psychiatric:        Mood and Affect: Mood normal.      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   Time spent with patient: 40 minutes: medications; plan of care; dietary.    Ok Edwards NP [facility]   call [phone]

## 2022-07-31 ENCOUNTER — Non-Acute Institutional Stay (SKILLED_NURSING_FACILITY): Payer: Medicare Other | Admitting: Internal Medicine

## 2022-07-31 ENCOUNTER — Encounter: Payer: Self-pay | Admitting: Internal Medicine

## 2022-07-31 DIAGNOSIS — J449 Chronic obstructive pulmonary disease, unspecified: Secondary | ICD-10-CM

## 2022-07-31 DIAGNOSIS — G20B2 Parkinson's disease with dyskinesia, with fluctuations: Secondary | ICD-10-CM | POA: Diagnosis not present

## 2022-07-31 DIAGNOSIS — I10 Essential (primary) hypertension: Secondary | ICD-10-CM | POA: Diagnosis not present

## 2022-07-31 DIAGNOSIS — F323 Major depressive disorder, single episode, severe with psychotic features: Secondary | ICD-10-CM | POA: Diagnosis not present

## 2022-07-31 NOTE — Assessment & Plan Note (Signed)
BP controlled; no change in antihypertensive medications  

## 2022-07-31 NOTE — Assessment & Plan Note (Addendum)
As per movement clinic recommendations, carbidopa/levodopa dose was titrated.  There has been no significant clinical change.  The most striking findings are behavioral issues of incessant crying when she observes staff in the room. The complex psychotropic regimen would require a Pharmacist and Neuropsychiatrist to optimally assess and manage.

## 2022-07-31 NOTE — Progress Notes (Signed)
NURSING HOME LOCATION:  [facility] ROOM NUMBER:  66 W  CODE STATUS: DNR / Palliative Care  PCP:  Ok Edwards NP  This is a nursing facility follow up visit of chronic medical diagnoses & to document compliance with Regulation 483.30 (c) in The [place] Manual Phase 2 which mandates caregiver visit ( visits can alternate among physician, PA or NP as per statutes) within 10 days of 30 days / 60 days/ 90 days post admission to SNF date    Interim medical record and care since last SNF visit was updated with review of diagnostic studies and change in clinical status since last visit were documented.  HPI: She is a permanent resident of this facility with medical diagnoses of chronic depression, GERD, dyslipidemia, essential hypertension,long standing Parkinson's disease (onset in her early [id number]) complicated by dementia, OSA, history of stroke, and vitamin D deficiency. Significant surgeries include stereotactic insertion of deep brain stimulator bilaterally.  She is also had esophageal dilation and colon polypectomy.  Most current labs were completed 02/24/2022.  CKD high stage IIIa was present with a creatinine 1.01 and GFR of 58.  There was mild elevation of hemoglobin/ hematocrit with values of 15.6/46.3 with normochromic, normocytic indices.  The last movement disorder clinic visit was 02/07/2022.  Dr. Buck Mam suggested considering titrating her Carbidopa/levodopa with monitor for hallucinations.  DBS was interrogated and no changes made in settings.  Review of systems could not be completed due to behavioral issues and indiscernible speech.  Physical exam:  Pertinent or positive findings: As I stood outside her door she quietly lay in the bed exhibiting a constant mastication type tremor of the mandible and coarse tremor of the left upper extremity.  As soon as I entered the room & she saw me she began to cry incessantly.  Her face was contorted in a  grimace with eyes squinting almost closed, especially on the left.  Intermittently there appeared to be slight anisocoria with the right pupil greater than the left but there was reaction to light.  There was faint erythema over the inferior forehead between the eyebrows as well as malar areas.  She was essentially edentulous except for 2 maxillary teeth and 1 or 2 mandibular teeth.  Visualization was difficult due to lack of cooperation.  Heart sounds were virtually indiscernible and heard only minimally in the epigastrium.  As noted she exhibited incessant sobbing and crying which limited thoracic auscultation.  Subcutaneous stimulator was present @ the right chest.  Pedal pulses could not be palpated.  There was marked limb atrophy and interosseous wasting.  The left hand exhibited marked contractures of the fingers.  There are flexion contractures of the left toes greater than the right.  The left second toe overlaps the great toe.  The right great toe was hyperextended.  Hyperpigmentation changes were noted over the shins, greater on the left than the right. After examining her, I interviewed her nurse.  She stated this is typical behavior.  She had no other updates.  I then went back to the room and again observed the patient from the hallway.  She had stopped crying and was simply lying in the bed staring blankly at the ceiling.  She continued to exhibit the mastication type tremor of the jaw and coarse tremor of the left upper extremity.  General appearance: no acute distress, increased work of breathing is present.   Lymphatic: No lymphadenopathy about the head, neck, axilla. Eyes: No conjunctival  inflammation or lid edema is present. There is no scleral icterus. Ears:  External ear exam shows no significant lesions or deformities.   Nose:  External nasal examination shows no deformity or inflammation. Nasal mucosa are pink and moist without lesions, exudates Neck:  No thyromegaly, masses,  tenderness noted.    Abdomen: Bowel sounds are normal. Abdomen is soft and nontender with no organomegaly, hernias, masses. GU: Deferred  Extremities:  No cyanosis, clubbing, edema  Neurologic exam :Balance, Rhomberg, finger to nose testing could not be completed due to clinical state Skin: Warm & dry w/o tenting.  See summary under each active problem in the Problem List with associated updated therapeutic plan

## 2022-07-31 NOTE — Assessment & Plan Note (Signed)
She can provide no meaningful history; clinically she does appear to be profoundly depressed manifested by incessant crying and facial grimacing.  Again neuropsychiatric and pharmacy assessment of polypharmacy is only option.

## 2022-07-31 NOTE — Patient Instructions (Signed)
See assessment and plan under each diagnosis in the problem list and acutely for this visit 

## 2022-07-31 NOTE — Assessment & Plan Note (Deleted)
She can provide no meaningful history; clinically she does appear to be profoundly depressed manifested by incessant crying and facial grimacing.  Again neuropsychiatric and pharmacy assessment of polypharmacy is only option.

## 2022-07-31 NOTE — Assessment & Plan Note (Addendum)
Clinically she is stable with present pulmonary regimen; O2 sats are excellent.  No change indicated.

## 2022-08-04 ENCOUNTER — Other Ambulatory Visit: Payer: Self-pay | Admitting: Internal Medicine

## 2022-08-05 ENCOUNTER — Other Ambulatory Visit: Payer: Self-pay | Admitting: Adult Health

## 2022-08-05 MED ORDER — ALPRAZOLAM 0.25 MG PO TABS: 0.2500 mg | ORAL_TABLET | Freq: Every day | ORAL | 0 refills | Status: DC

## 2022-08-26 ENCOUNTER — Encounter: Payer: Self-pay | Admitting: Adult Health

## 2022-08-26 ENCOUNTER — Non-Acute Institutional Stay (SKILLED_NURSING_FACILITY): Payer: Medicare Other | Admitting: Adult Health

## 2022-08-26 DIAGNOSIS — K5909 Other constipation: Secondary | ICD-10-CM | POA: Diagnosis not present

## 2022-08-26 DIAGNOSIS — K219 Gastro-esophageal reflux disease without esophagitis: Secondary | ICD-10-CM | POA: Diagnosis not present

## 2022-08-26 DIAGNOSIS — J449 Chronic obstructive pulmonary disease, unspecified: Secondary | ICD-10-CM

## 2022-08-26 NOTE — Progress Notes (Unsigned)
Location:  [place] Room Number: [place] of Service:  SNF (31)   CODE STATUS: DNR  Allergies  Allergen Reactions   Oxycodone Other (See Comments)    Can tolerate low dose mixed with tynlelol HALLUCINATIONS    Buspar [Buspirone] Other (See Comments)    Unknown   Docosahexaenoic Acid Other (See Comments)   Eicosapentaenoic Acid    Eicosapentaenoic Acid (Epa)    Lutein Nausea And Vomiting   Omega-3 Fatty Acids Other (See Comments)   Other    Aspirin Nausea And Vomiting and Other (See Comments)    Severe stomach pain    Codeine Nausea Only    Makes her feel very sick     Chief Complaint  Patient presents with   Medical Management of Chronic Issues    Routine visit    HPI:    Past Medical History:  Diagnosis Date   Anxiety    Arthritis    Complication of anesthesia    Depression    Depression with suicidal ideation 01/21/2021   Dysphasia    GERD (gastroesophageal reflux disease)    Hyperlipidemia    Hypertension    Parkinson's disease    diagnosed at age 59   Parkinson's disease dementia 07/31/2020   PONV (postoperative nausea and vomiting)    states it has been a long time since getting sick   Sleep apnea    Has CPAP but doesnt use   Stroke    TIA (transient ischemic attack)    not really TIA but abnormal MRI per pt left sided weakness   UTI (lower urinary tract infection)    Vitamin D deficiency     Past Surgical History:  Procedure Laterality Date   BACK SURGERY     Battery change     DBS, 12/2009   BIOPSY N/A 11/29/2013   Procedure: GASTRIC BIOPSY;  Surgeon: Danie Binder, MD;  Location: [facility];  Service: Endoscopy;  Laterality: N/A;   BURR HOLE W/ STEREOTACTIC INSERTION OF DBS LEADS / INTRAOP MICROELECTRODE RECORDING     2007   CARPAL TUNNEL RELEASE Right    COLONOSCOPY WITH PROPOFOL N/A 11/29/2013   Procedure: COLONOSCOPY WITH PROPOFOL;  Surgeon: Danie Binder, MD;  Location: [facility];  Service: Endoscopy;  Laterality:  N/A;  entered cecum @ [phone]; total cecal withdrawal time 21 minutes    ELBOW SURGERY Left    after fx   ESOPHAGEAL DILATION N/A 11/29/2013   Procedure: ESOPHAGEAL DILATION;  Surgeon: Danie Binder, MD;  Location: [facility];  Service: Endoscopy;  Laterality: N/A;  [place] 14/15/16   ESOPHAGOGASTRODUODENOSCOPY (EGD) WITH PROPOFOL N/A 11/29/2013   Procedure: ESOPHAGOGASTRODUODENOSCOPY (EGD) WITH PROPOFOL;  Surgeon: Danie Binder, MD;  Location: [facility];  Service: Endoscopy;  Laterality: N/A;   FOOT SURGERY Left    "something with 2nd 2."   NECK SURGERY     Fusion   POLYPECTOMY N/A 11/29/2013   Procedure: POLYPECTOMY;  Surgeon: Danie Binder, MD;  Location: [facility];  Service: Endoscopy;  Laterality: N/A;  Distal Transverse Colon x2    PR ANALYZE NEUROSTIM BRAIN, FIRST 1H  10/15/2012       PULSE GENERATOR IMPLANT Right 03/30/2018   Procedure: Right Implantable pulse generator change;  Surgeon: Erline Levine, MD;  Location: [place];  Service: Neurosurgery;  Laterality: Right;   SUBTHALAMIC STIMULATOR BATTERY REPLACEMENT N/A 08/05/2013   Procedure: SUBTHALAMIC STIMULATOR BATTERY REPLACEMENT;  Surgeon: Erline Levine, MD;  Location: [place] [facility];  Service: Neurosurgery;  Laterality: N/A;   SUBTHALAMIC STIMULATOR BATTERY REPLACEMENT N/A 11/26/2015   Procedure: Implantable pulse generator battery change for deep brain stimulator;  Surgeon: Erline Levine, MD;  Location: [place] [facility];  Service: Neurosurgery;  Laterality: N/A;   SUBTHALAMIC STIMULATOR BATTERY REPLACEMENT Right 09/13/2020   Procedure: Right chest implantable pulse generator change for depleted battery;  Surgeon: Erline Levine, MD;  Location: [place] [facility];  Service: Neurosurgery;  Laterality: Right;  3C/RM 18   VAGINAL HYSTERECTOMY     "partial"   WRIST SURGERY Right    after fx    Social History   Socioeconomic History   Marital status: Widowed    Spouse name: Not on file   Number of children: Not on file   Years of education: Not on file   Highest  education level: Not on file  Occupational History   Occupation: Retired  Tobacco Use   Smoking status: Former    Packs/day: 0.50    Years: 25.00    Additional pack years: 0.00    Total pack years: 12.50    Types: Cigarettes    Quit date: 03/01/2019    Years since quitting: 3.4   Smokeless tobacco: Never  Vaping Use   Vaping Use: Never used  Substance and Sexual Activity   Alcohol use: Not Currently   Drug use: No   Sexual activity: Not on file  Other Topics Concern   Not on file  Social History Narrative   She resides at [facility] (assisted living facility).   Social Determinants of Health   Financial Resource Strain: Low Risk  (03/29/2019)   Overall Financial Resource Strain (CARDIA)    Difficulty of Paying Living Expenses: Not very hard  Food Insecurity: Not on file  Transportation Needs: No Transportation Needs (03/29/2019)   PRAPARE - Hydrologist (Medical): No    Lack of Transportation (Non-Medical): No  Physical Activity: Not on file  Stress: Not on file  Social Connections: Moderately Isolated (03/29/2019)   Social Connection and Isolation Panel [NHANES]    Frequency of Communication with Friends and Family: More than three times a week    Frequency of Social Gatherings with Friends and Family: Once a week    Attends Religious Services: 1 to 4 times per year    Active Member of [employer] or Organizations: No    Attends Archivist Meetings: Never    Marital Status: Widowed  Intimate Partner Violence: Unknown (03/29/2019)   Humiliation, Afraid, Rape, and Kick questionnaire    Fear of Current or Ex-Partner: No    Emotionally Abused: Not on file    Physically Abused: Not on file    Sexually Abused: Not on file   Family History  Problem Relation Age of Onset   Colon cancer Paternal Uncle       VITAL SIGNS BP (!) 146/70   Pulse 78   Temp 98.6 F (37 C)   Resp 20   Ht 5\' 2"  (1.575 m)   Wt 119 lb 12.8 oz (54.3 kg)   SpO2  98%   BMI 21.91 kg/m   Outpatient Encounter Medications as of 08/26/2022  Medication Sig   acetaminophen (TYLENOL) 500 MG tablet Take 500 mg by mouth 3 (three) times daily.   ALPRAZolam (XANAX) 0.25 MG tablet Take 1 tablet (0.25 mg total) by mouth daily. At 4 pm   BREZTRI AEROSPHERE 160-9-4.8 MCG/ACT AERO Inhale 2 puffs into the lungs 2 (two) times daily.  calcium-vitamin D (OSCAL WITH D) 500-5 MG-MCG tablet Take 1 tablet by mouth daily.   Camphor-Menthol-Methyl Sal (SALONPAS) 3.05-31-08 % [facility] Special Instructions: apply one pack to both sides of lower back daily and remove and at HS. for bilateral lower back pain. [DX: Chronic pain syndrome-per NP note]   carbidopa-levodopa (SINEMET IR) 25-100 MG tablet Take 1 tablet by mouth 2 (two) times daily.   clopidogrel (PLAVIX) 75 MG tablet Take 75 mg by mouth daily.   Dextromethorphan-quiNIDine (NUEDEXTA) 20-10 MG capsule Take 1 capsule by mouth 2 (two) times daily.   docusate sodium (COLACE) 100 MG capsule Take 100 mg by mouth daily.   donepezil (ARICEPT) 5 MG tablet Take 5 mg by mouth at bedtime. 9 pm for parkinson's dementia   DULoxetine (CYMBALTA) 60 MG capsule Take 60 mg by mouth daily. 9 am   losartan (COZAAR) 100 MG tablet Take 100 mg by mouth daily.   meloxicam (MOBIC) 15 MG tablet Take 15 mg by mouth daily.   NON FORMULARY Diet: : Dys 2 (chopped) diet, continue thin liquids; will allow pizza if requested  Liquids:Regular   omeprazole (PRILOSEC) 20 MG capsule Take 20 mg by mouth in the morning and at bedtime.    polyethylene glycol (MIRALAX / GLYCOLAX) 17 g packet Take 17 g by mouth daily.   pramipexole (MIRAPEX) 0.5 MG tablet Take 0.5 mg by mouth at bedtime.   QUEtiapine (SEROQUEL) 25 MG tablet Take 12.5 mg by mouth daily.   traZODone (DESYREL) 50 MG tablet Take 25 mg by mouth at bedtime.   UNABLE TO FIND Take 120 mLs by mouth 3 (three) times daily. Med Name: Med Pass 2.0   [DISCONTINUED] Cholecalciferol 50 MCG (2000 UT) CAPS Take 1  capsule by mouth daily.   [DISCONTINUED] Nutritional Supplements (ENSURE ENLIVE PO) Take by mouth. With meals   No facility-administered encounter medications on file as of 08/26/2022.     SIGNIFICANT DIAGNOSTIC EXAMS       ASSESSMENT/ PLAN:     Ok Edwards NP [facility]  Contact [phone] Monday through Friday 8am- 5pm  After hours call [phone]

## 2022-08-27 ENCOUNTER — Encounter: Payer: Self-pay | Admitting: Adult Health

## 2022-08-27 ENCOUNTER — Non-Acute Institutional Stay (SKILLED_NURSING_FACILITY): Payer: Medicare Other | Admitting: Adult Health

## 2022-08-27 DIAGNOSIS — G20B2 Parkinson's disease with dyskinesia, with fluctuations: Secondary | ICD-10-CM | POA: Diagnosis not present

## 2022-08-27 DIAGNOSIS — G20A1 Parkinson's disease without dyskinesia, without mention of fluctuations: Secondary | ICD-10-CM | POA: Diagnosis not present

## 2022-08-27 DIAGNOSIS — F02C2 Dementia in other diseases classified elsewhere, severe, with psychotic disturbance: Secondary | ICD-10-CM

## 2022-08-27 DIAGNOSIS — R627 Adult failure to thrive: Secondary | ICD-10-CM | POA: Diagnosis not present

## 2022-08-27 DIAGNOSIS — R634 Abnormal weight loss: Secondary | ICD-10-CM | POA: Diagnosis not present

## 2022-08-27 NOTE — Progress Notes (Signed)
Location:  [facility] Room Number: [address] Place of Service:  SNF (31)   CODE STATUS: DNR  Allergies  Allergen Reactions   Oxycodone Other (See Comments)    Can tolerate low dose mixed with tynlelol HALLUCINATIONS    Buspar [Buspirone] Other (See Comments)    Unknown   Docosahexaenoic Acid Other (See Comments)   Eicosapentaenoic Acid    Eicosapentaenoic Acid (Epa)    Lutein Nausea And Vomiting   Omega-3 Fatty Acids Other (See Comments)   Other    Aspirin Nausea And Vomiting and Other (See Comments)    Severe stomach pain    Codeine Nausea Only    Makes her feel very sick     Chief Complaint  Patient presents with   Acute Visit    Weight issues    HPI:  She is losing weight. Her weight 6 months ago was 134 pounds with her weight in April was 119 pounds. she has lost 11% of her weight. One year ago her weight is was 155 pounds. Her appetite is variable; and she is taking ensure three times daily. OT for adaptive equipment. She has declined restorative dining.   Past Medical History:  Diagnosis Date   Anxiety    Arthritis    Complication of anesthesia    Depression    Depression with suicidal ideation 01/21/2021   Dysphasia    GERD (gastroesophageal reflux disease)    Hyperlipidemia    Hypertension    Parkinson's disease    diagnosed at age 80   Parkinson's disease dementia 07/31/2020   PONV (postoperative nausea and vomiting)    states it has been a long time since getting sick   Sleep apnea    Has CPAP but doesnt use   Stroke    TIA (transient ischemic attack)    not really TIA but abnormal MRI per pt left sided weakness   UTI (lower urinary tract infection)    Vitamin D deficiency     Past Surgical History:  Procedure Laterality Date   BACK SURGERY     Battery change     DBS, 12/2009   BIOPSY N/A 11/29/2013   Procedure: GASTRIC BIOPSY;  Surgeon: West Bali, MD;  Location: [facility];  Service: Endoscopy;  Laterality: N/A;   BURR HOLE  W/ STEREOTACTIC INSERTION OF DBS LEADS / INTRAOP MICROELECTRODE RECORDING     2007   CARPAL TUNNEL RELEASE Right    COLONOSCOPY WITH PROPOFOL N/A 11/29/2013   Procedure: COLONOSCOPY WITH PROPOFOL;  Surgeon: West Bali, MD;  Location: [facility];  Service: Endoscopy;  Laterality: N/A;  entered cecum @ [phone]; total cecal withdrawal time 21 minutes    ELBOW SURGERY Left    after fx   ESOPHAGEAL DILATION N/A 11/29/2013   Procedure: ESOPHAGEAL DILATION;  Surgeon: West Bali, MD;  Location: [facility];  Service: Endoscopy;  Laterality: N/A;  [place] 14/15/16   ESOPHAGOGASTRODUODENOSCOPY (EGD) WITH PROPOFOL N/A 11/29/2013   Procedure: ESOPHAGOGASTRODUODENOSCOPY (EGD) WITH PROPOFOL;  Surgeon: West Bali, MD;  Location: [facility];  Service: Endoscopy;  Laterality: N/A;   FOOT SURGERY Left    "something with 2nd 2."   NECK SURGERY     Fusion   POLYPECTOMY N/A 11/29/2013   Procedure: POLYPECTOMY;  Surgeon: West Bali, MD;  Location: [facility];  Service: Endoscopy;  Laterality: N/A;  Distal Transverse Colon x2    PR ANALYZE NEUROSTIM BRAIN, FIRST 1H  10/15/2012  PULSE GENERATOR IMPLANT Right 03/30/2018   Procedure: Right Implantable pulse generator change;  Surgeon: Maeola Harman, MD;  Location: [facility];  Service: Neurosurgery;  Laterality: Right;   SUBTHALAMIC STIMULATOR BATTERY REPLACEMENT N/A 08/05/2013   Procedure: SUBTHALAMIC STIMULATOR BATTERY REPLACEMENT;  Surgeon: Maeola Harman, MD;  Location: [facility];  Service: Neurosurgery;  Laterality: N/A;   SUBTHALAMIC STIMULATOR BATTERY REPLACEMENT N/A 11/26/2015   Procedure: Implantable pulse generator battery change for deep brain stimulator;  Surgeon: Maeola Harman, MD;  Location: [facility];  Service: Neurosurgery;  Laterality: N/A;   SUBTHALAMIC STIMULATOR BATTERY REPLACEMENT Right 09/13/2020   Procedure: Right chest implantable pulse generator change for depleted battery;  Surgeon: Maeola Harman, MD;  Location: [facility] OR;  Service: Neurosurgery;  Laterality:  Right;  3C/RM 18   VAGINAL HYSTERECTOMY     "partial"   WRIST SURGERY Right    after fx    Social History   Socioeconomic History   Marital status: Widowed    Spouse name: Not on file   Number of children: Not on file   Years of education: Not on file   Highest education level: Not on file  Occupational History   Occupation: Retired  Tobacco Use   Smoking status: Former    Packs/day: 0.50    Years: 25.00    Additional pack years: 0.00    Total pack years: 12.50    Types: Cigarettes    Quit date: 03/01/2019    Years since quitting: 3.5   Smokeless tobacco: Never  Vaping Use   Vaping Use: Never used  Substance and Sexual Activity   Alcohol use: Not Currently   Drug use: No   Sexual activity: Not on file  Other Topics Concern   Not on file  Social History Narrative   She resides at [facility] (assisted living facility).   Social Determinants of Health   Financial Resource Strain: Low Risk  (03/29/2019)   Overall Financial Resource Strain (CARDIA)    Difficulty of Paying Living Expenses: Not very hard  Food Insecurity: Not on file  Transportation Needs: No Transportation Needs (03/29/2019)   PRAPARE - Administrator, Civil Service (Medical): No    Lack of Transportation (Non-Medical): No  Physical Activity: Not on file  Stress: Not on file  Social Connections: Moderately Isolated (03/29/2019)   Social Connection and Isolation Panel [NHANES]    Frequency of Communication with Friends and Family: More than three times a week    Frequency of Social Gatherings with Friends and Family: Once a week    Attends Religious Services: 1 to 4 times per year    Active Member of [employer] or Organizations: No    Attends Banker Meetings: Never    Marital Status: Widowed  Intimate Partner Violence: Unknown (03/29/2019)   Humiliation, Afraid, Rape, and Kick questionnaire    Fear of Current or Ex-Partner: No    Emotionally Abused: Not on file    Physically  Abused: Not on file    Sexually Abused: Not on file   Family History  Problem Relation Age of Onset   Colon cancer Paternal Uncle       VITAL SIGNS BP (!) 142/76   Pulse 78   Temp 98.6 F (37 C)   Resp 20   Ht  (1.575 m)   Wt 119 lb 12.8 oz (54.3 kg)   SpO2 98%   BMI 21.91 kg/m   Outpatient Encounter Medications as of 08/27/2022  Medication  Sig   acetaminophen (TYLENOL) 650 MG CR tablet Take 650 mg by mouth 3 (three) times daily.   ALPRAZolam (XANAX) 0.25 MG tablet Take 1 tablet (0.25 mg total) by mouth daily. At 4 pm   BREZTRI AEROSPHERE 160-9-4.8 MCG/ACT AERO Inhale 2 puffs into the lungs 2 (two) times daily.   calcium-vitamin D (OSCAL WITH D) 500-5 MG-MCG tablet Take 1 tablet by mouth daily.   Camphor-Menthol-Methyl Sal (SALONPAS) 3.05-31-08 % [facility] Special Instructions: apply one pack to both sides of lower back daily and remove and at HS. for bilateral lower back pain. [DX: Chronic pain syndrome-per NP note]   carbidopa-levodopa (SINEMET IR) 25-100 MG tablet Take 1 tablet by mouth 3 (three) times daily.   clopidogrel (PLAVIX) 75 MG tablet Take 75 mg by mouth daily.   Dextromethorphan-quiNIDine (NUEDEXTA) 20-10 MG capsule Take 1 capsule by mouth 2 (two) times daily.   docusate sodium (COLACE) 100 MG capsule Take 100 mg by mouth daily.   donepezil (ARICEPT) 5 MG tablet Take 5 mg by mouth at bedtime. 9 pm for parkinson's dementia   DULoxetine (CYMBALTA) 60 MG capsule Take 60 mg by mouth daily. 9 am   losartan (COZAAR) 100 MG tablet Take 100 mg by mouth daily.   meloxicam (MOBIC) 15 MG tablet Take 15 mg by mouth daily.   NON FORMULARY Diet: : Dys 2 (chopped) diet, continue thin liquids; will allow pizza if requested  Liquids:Regular   omeprazole (PRILOSEC) 20 MG capsule Take 20 mg by mouth in the morning and at bedtime.    polyethylene glycol (MIRALAX / GLYCOLAX) 17 g packet Take 17 g by mouth daily.   pramipexole (MIRAPEX) 0.5 MG tablet Take 0.5 mg by mouth at bedtime.    QUEtiapine (SEROQUEL) 25 MG tablet Take 12.5 mg by mouth daily.   traZODone (DESYREL) 50 MG tablet Take 25 mg by mouth at bedtime.   UNABLE TO FIND Take 120 mLs by mouth 3 (three) times daily. Med Name: Med Pass 2.0   [DISCONTINUED] acetaminophen (TYLENOL) 500 MG tablet Take 500 mg by mouth 3 (three) times daily.   No facility-administered encounter medications on file as of 08/27/2022.     SIGNIFICANT DIAGNOSTIC EXAMS  LABS REVIEWED; PREVIOUS    11-07-21: chol 134; ldl 66; trig 173 hdl 33; hepatitis C: nr  01-30-22; wbc 6.0; hgb 14.5; hct 42.1; mcv 96.3 plt 190; glucose 102; bun 21; creat 0.76; k+ 4.0; na++ 140; ca 9.3 gfr >60; protein 6.5; albumin 3.6 hgb a1c 5.2; tsh 1.695 02-24-22: wbc 6.7; hgb 15.6; hct 46.3; mcv 98.7 pt 214; glucose 132; bun 36; creat 1.01; k+ 4.3; na++ 141; ca 9.8 gfr 58 d dimer 0.42; crp 1.1   NO NEW LABS.   Review of Systems  Reason unable to perform ROS: unable to fully participate.   Physical Exam Constitutional:      General: She is not in acute distress.    Appearance: She is well-developed. She is not diaphoretic.  Neck:     Thyroid: No thyromegaly.  Cardiovascular:     Rate and Rhythm: Normal rate and regular rhythm.     Pulses: Normal pulses.     Heart sounds: Normal heart sounds.  Pulmonary:     Effort: Pulmonary effort is normal. No respiratory distress.     Breath sounds: Normal breath sounds.  Abdominal:     General: Bowel sounds are normal. There is no distension.     Palpations: Abdomen is soft.     Tenderness: There  is no abdominal tenderness.  Musculoskeletal:     Cervical back: Neck supple.     Right lower leg: No edema.     Left lower leg: No edema.     Comments: Has left side weakness Leans head to the left  Bilateral hand tremor  Has bilateral eyebrow movement Right arm shrug   Lymphadenopathy:     Cervical: No cervical adenopathy.  Skin:    General: Skin is warm and dry.  Neurological:     Mental Status: She is alert. Mental  status is at baseline.  Psychiatric:        Mood and Affect: Mood normal.        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

## 2022-08-28 ENCOUNTER — Other Ambulatory Visit (HOSPITAL_COMMUNITY)
Admission: RE | Admit: 2022-08-28 | Discharge: 2022-08-28 | Disposition: A | Discharge status: Home / Self Care | Payer: Medicare Other | Source: Skilled Nursing Facility | Attending: Adult Health | Admitting: Adult Health

## 2022-08-28 DIAGNOSIS — I69391 Dysphagia following cerebral infarction: Secondary | ICD-10-CM | POA: Diagnosis present

## 2022-08-28 DIAGNOSIS — I10 Essential (primary) hypertension: Secondary | ICD-10-CM | POA: Diagnosis not present

## 2022-08-28 LAB — CBC
HCT: 41.4 % (ref 36.0–46.0)
Hemoglobin: 13.7 g/dL (ref 12.0–15.0)
MCH: 33.8 pg (ref 26.0–34.0)
MCHC: 33.1 g/dL (ref 30.0–36.0)
MCV: 102.2 fL — ABNORMAL HIGH (ref 80.0–100.0)
Platelets: 188 10*3/uL (ref 150–400)
RBC: 4.05 MIL/uL (ref 3.87–5.11)
RDW: 12.2 % (ref 11.5–15.5)
WBC: 4.9 10*3/uL (ref 4.0–10.5)
nRBC: 0 % (ref 0.0–0.2)

## 2022-08-28 LAB — COMPREHENSIVE METABOLIC PANEL
ALT: 6 U/L (ref 0–44)
AST: 18 U/L (ref 15–41)
Albumin: 3.8 g/dL (ref 3.5–5.0)
Alkaline Phosphatase: 87 U/L (ref 38–126)
Anion gap: 7 (ref 5–15)
BUN: 42 mg/dL — ABNORMAL HIGH (ref 8–23)
CO2: 29 mmol/L (ref 22–32)
Calcium: 9.3 mg/dL (ref 8.9–10.3)
Chloride: 103 mmol/L (ref 98–111)
Creatinine, Ser: 0.96 mg/dL (ref 0.44–1.00)
GFR, Estimated: >60 mL/min (ref >60)
Glucose, Bld: 87 mg/dL (ref 70–99)
Potassium: 3.6 mmol/L (ref 3.5–5.1)
Sodium: 139 mmol/L (ref 135–145)
Total Bilirubin: 0.7 mg/dL (ref 0.3–1.2)
Total Protein: 6.6 g/dL (ref 6.5–8.1)

## 2022-08-28 LAB — TSH: TSH: 0.696 u[IU]/mL (ref 0.350–4.500)

## 2022-09-01 DIAGNOSIS — R627 Adult failure to thrive: Secondary | ICD-10-CM | POA: Insufficient documentation

## 2022-09-04 ENCOUNTER — Other Ambulatory Visit: Payer: Self-pay | Admitting: Adult Health

## 2022-09-04 MED ORDER — ALPRAZOLAM 0.25 MG PO TABS: 0.2500 mg | ORAL_TABLET | Freq: Every day | ORAL | 0 refills | Status: DC

## 2022-09-12 ENCOUNTER — Non-Acute Institutional Stay (SKILLED_NURSING_FACILITY): Payer: Medicare Other | Admitting: Internal Medicine

## 2022-09-12 ENCOUNTER — Encounter: Payer: Self-pay | Admitting: Internal Medicine

## 2022-09-12 DIAGNOSIS — I10 Essential (primary) hypertension: Secondary | ICD-10-CM | POA: Diagnosis not present

## 2022-09-12 DIAGNOSIS — R627 Adult failure to thrive: Secondary | ICD-10-CM

## 2022-09-12 DIAGNOSIS — Z8673 Personal history of transient ischemic attack (TIA), and cerebral infarction without residual deficits: Secondary | ICD-10-CM | POA: Diagnosis not present

## 2022-09-12 NOTE — Assessment & Plan Note (Signed)
She is on clopidogrel prophylaxis.  Clinically there is no definite evidence of acute CVA.  Will continue to monitor and reconsider possibly changing to Eliquis if clinically indicated.

## 2022-09-12 NOTE — Patient Instructions (Signed)
See assessment and plan under each diagnosis in the problem list and acutely for this visit 

## 2022-09-12 NOTE — Assessment & Plan Note (Signed)
The recent labs performed 08/28/2022 were surprisingly normal.  The TSH was low normal at 0.696.  She is not on thyroid supplement.  TSH with free T4 and T3 will be collected to rule out subclinical hyperthyroidism as the etiology of her tachycardia and anxiety.

## 2022-09-12 NOTE — Progress Notes (Unsigned)
NURSING HOME LOCATION:  [facility] ROOM NUMBER:  65 W  CODE STATUS:  DNR  PCP:  Synthia Innocent NP  This is a nursing facility follow up visit for specific acute issue of acute agitation with tachycardia & hypertension.  Interim medical record and care since last SNF visit was updated with review of diagnostic studies and change in clinical status since last visit were documented.  HPI: Staff noted her to be agitated and when she entered the room she was crying that she was "having a stroke."  She was noted to be hypertensive with systolic blood pressure 146 and tachycardia with a pulse rate of 100+.  I was asked to evaluate her.  History was obtained from the attending staff as she is essentially nonverbal.  The patient has exhibited such behavior in the afternoons repeatedly for which Xanax is ordered.  I have seen the pattern where she will be staring blankly lying in bed or sitting in her chair; but when someone enters the room she begins to cry and become agitated.  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 She is a permanent resident of this facility with diagnoses of dyslipidemia, essential hypertension, Parkinson's complicated by dementia, anxiety/depression, and history of stroke. Significant procedures include having had a deep brain stimulator implanted.  On 08/28/2022 she had extensive labs which were surprisingly normal except for prerenal azotemia.  Her TSH was extremely low at 0.696 and serially had dropped by point.  She is not on thyroid supplement.  Review of systems: During my exam she was completely nonverbal. Constitutional: No fever, significant weight change, fatigue  Eyes: No redness, discharge, pain, vision change ENT/mouth: No nasal congestion,  purulent discharge, earache, change in hearing, sore throat  Cardiovascular: No chest pain, palpitations, paroxysmal nocturnal dyspnea, claudication, edema  Respiratory: No cough, sputum production,  hemoptysis, DOE, significant snoring, apnea   Gastrointestinal: No heartburn, dysphagia, abdominal pain, nausea /vomiting, rectal bleeding, melena, change in bowels Genitourinary: No dysuria, hematuria, pyuria, incontinence, nocturia Musculoskeletal: No joint stiffness, joint swelling, weakness, pain Dermatologic: No rash, pruritus, change in appearance of skin Neurologic: No dizziness, headache, syncope, seizures, numbness, tingling Psychiatric: No significant anxiety, depression, insomnia, anorexia Endocrine: No change in hair/skin/nails, excessive thirst, excessive hunger, excessive urination  Hematologic/lymphatic: No significant bruising, lymphadenopathy, abnormal bleeding Allergy/immunology: No itchy/watery eyes, significant sneezing, urticaria, angioedema  Physical exam:  Pertinent or positive findings: She appears chronically ill and emaciated.  Temporal wasting is present.  She exhibits diffuse facial erythema.  She has a grimacing countenance.  Mouth is agape.  She is missing multiple maxillary teeth.  The mandible is edentulous.  There is a tremor of the mandible.  DBS is implanted over the right chest.  Tachycardia is present with accentuation of first and second heart sounds.  Breath sounds are decreased.  Dorsalis pedis pulses are stronger than posterior tibial pulses.  She exhibits a coarse tremor of the left hand which remains clenched.  She also has a tremor of the left foot.  There is weakness to opposition of the upper extremities which seems fairly symmetric.  She did not oppose my hands in either lower extremity.  She has irregular hyperpigmentation over the shins.  General appearance: Adequately nourished; no acute distress, increased work of breathing is present.   Lymphatic: No lymphadenopathy about the head, neck, axilla. Eyes: No conjunctival inflammation or lid edema is present. There is no scleral icterus. Ears:  External ear exam shows no significant lesions or  deformities.   Nose:  External nasal examination shows no deformity or inflammation. Nasal mucosa are pink and moist without lesions, exudates Oral exam:  Lips and gums are healthy appearing. There is no oropharyngeal erythema or exudate. Neck:  No thyromegaly, masses, tenderness noted.    Heart:  Normal rate and regular rhythm. S1 and S2 normal without gallop, murmur, click, rub .  Lungs: Chest clear to auscultation without wheezes, rhonchi, rales, rubs. Abdomen: Bowel sounds are normal. Abdomen is soft and nontender with no organomegaly, hernias, masses. GU: Deferred  Extremities:  No cyanosis, clubbing, edema  Neurologic exam : Cn 2-7 intact Strength equal  in upper & lower extremities Balance, Rhomberg, finger to nose testing could not be completed due to clinical state Deep tendon reflexes are equal Skin: Warm & dry w/o tenting. No significant lesions or rash.  See summary under each active problem in the Problem List with associated updated therapeutic plan

## 2022-09-12 NOTE — Assessment & Plan Note (Signed)
The current event is associated with tachycardia and hypertension.  Metoprolol 25 mg twice daily will be initiated.

## 2022-09-13 ENCOUNTER — Other Ambulatory Visit (HOSPITAL_COMMUNITY)
Admission: RE | Admit: 2022-09-13 | Discharge: 2022-09-13 | Disposition: A | Discharge status: Home / Self Care | Payer: Medicare Other | Attending: Adult Health | Admitting: Adult Health

## 2022-09-13 DIAGNOSIS — R000 Tachycardia, unspecified: Secondary | ICD-10-CM | POA: Insufficient documentation

## 2022-09-13 DIAGNOSIS — G20C Parkinsonism, unspecified: Secondary | ICD-10-CM | POA: Diagnosis present

## 2022-09-13 DIAGNOSIS — R634 Abnormal weight loss: Secondary | ICD-10-CM | POA: Insufficient documentation

## 2022-09-13 LAB — T4, FREE: Free T4: 1.03 ng/dL (ref 0.61–1.12)

## 2022-09-13 LAB — TSH: TSH: 0.805 u[IU]/mL (ref 0.350–4.500)

## 2022-09-14 LAB — T3, FREE: T3, Free: 2.9 pg/mL (ref 2.0–4.4)

## 2022-09-16 ENCOUNTER — Non-Acute Institutional Stay (SKILLED_NURSING_FACILITY): Payer: Medicare Other | Admitting: Adult Health

## 2022-09-16 ENCOUNTER — Encounter: Payer: Self-pay | Admitting: Adult Health

## 2022-09-16 DIAGNOSIS — F22 Delusional disorders: Secondary | ICD-10-CM | POA: Diagnosis not present

## 2022-09-16 DIAGNOSIS — G20B2 Parkinson's disease with dyskinesia, with fluctuations: Secondary | ICD-10-CM | POA: Diagnosis not present

## 2022-09-16 NOTE — Progress Notes (Signed)
Location:  [facility] Room Number: 108-W Place of Service:  SNF (31) Provider: Synthia Innocent, NP  CODE STATUS: DNR  Allergies  Allergen Reactions   Oxycodone Other (See Comments)    Can tolerate low dose mixed with tynlelol HALLUCINATIONS    Buspar [Buspirone] Other (See Comments)    Unknown   Docosahexaenoic Acid Other (See Comments)   Eicosapentaenoic Acid    Eicosapentaenoic Acid (Epa)    Lutein Nausea And Vomiting   Omega-3 Fatty Acids Other (See Comments)   Other    Aspirin Nausea And Vomiting and Other (See Comments)    Severe stomach pain    Codeine Nausea Only    Makes her feel very sick     Chief Complaint  Patient presents with   Acute Visit    Psychosis.    HPI:  She had been on seroquel 12.5 mg daily for her psychosis. She did have a gradual dose reduction. She has failed this wean. She is having delusional thoughts such as: I have broken my arm; I am having a seizure; I am having a stroke.   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   PONV (postoperative nausea and vomiting)    states it has been a long time since getting sick   Sleep apnea    Has CPAP but doesnt use   Stroke    TIA (transient ischemic attack)    not really TIA but abnormal MRI per pt left sided weakness   UTI (lower urinary tract infection)    Vitamin D deficiency     Past Surgical History:  Procedure Laterality Date   BACK SURGERY     Battery change     DBS, 12/2009   BIOPSY N/A 11/29/2013   Procedure: GASTRIC BIOPSY;  Surgeon: West Bali, MD;  Location: [facility];  Service: Endoscopy;  Laterality: N/A;   BURR HOLE W/ STEREOTACTIC INSERTION OF DBS LEADS / INTRAOP  MICROELECTRODE RECORDING     2007   CARPAL TUNNEL RELEASE Right    COLONOSCOPY WITH PROPOFOL N/A 11/29/2013   Procedure: COLONOSCOPY WITH PROPOFOL;  Surgeon: West Bali, MD;  Location: [facility];  Service: Endoscopy;  Laterality: N/A;  entered cecum @ [phone]; total cecal withdrawal time 21 minutes    ELBOW SURGERY Left    after fx   ESOPHAGEAL DILATION N/A 11/29/2013   Procedure: ESOPHAGEAL DILATION;  Surgeon: West Bali, MD;  Location: [facility];  Service: Endoscopy;  Laterality: N/A;  [facility] 14/15/16   ESOPHAGOGASTRODUODENOSCOPY (EGD) WITH PROPOFOL N/A 11/29/2013   Procedure: ESOPHAGOGASTRODUODENOSCOPY (EGD) WITH PROPOFOL;  Surgeon: West Bali, MD;  Location: [facility];  Service: Endoscopy;  Laterality: N/A;   FOOT SURGERY Left    "something with 2nd 2."   NECK SURGERY     Fusion   POLYPECTOMY N/A 11/29/2013   Procedure: POLYPECTOMY;  Surgeon: West Bali, MD;  Location: [facility];  Service: Endoscopy;  Laterality: N/A;  Distal Transverse Colon x2    PR ANALYZE NEUROSTIM BRAIN, FIRST 1H  10/15/2012       PULSE GENERATOR IMPLANT Right 03/30/2018   Procedure: Right Implantable  pulse generator change;  Surgeon: Maeola Harman, MD;  Location: [facility] OR;  Service: Neurosurgery;  Laterality: Right;   SUBTHALAMIC STIMULATOR BATTERY REPLACEMENT N/A 08/05/2013   Procedure: SUBTHALAMIC STIMULATOR BATTERY REPLACEMENT;  Surgeon: Maeola Harman, MD;  Location: [facility];  Service: Neurosurgery;  Laterality: N/A;   SUBTHALAMIC STIMULATOR BATTERY REPLACEMENT N/A 11/26/2015   Procedure: Implantable pulse generator battery change for deep brain stimulator;  Surgeon: Maeola Harman, MD;  Location: [facility];  Service: Neurosurgery;  Laterality: N/A;   SUBTHALAMIC STIMULATOR BATTERY REPLACEMENT Right 09/13/2020   Procedure: Right chest implantable pulse generator change for depleted battery;  Surgeon: Maeola Harman, MD;  Location: [facility] OR;  Service: Neurosurgery;  Laterality: Right;  3C/RM 18   VAGINAL HYSTERECTOMY      "partial"   WRIST SURGERY Right    after fx    Social History   Socioeconomic History   Marital status: Widowed    Spouse name: Not on file   Number of children: Not on file   Years of education: Not on file   Highest education level: Not on file  Occupational History   Occupation: Retired  Tobacco Use   Smoking status: Former    Packs/day: 0.50    Years: 25.00    Additional pack years: 0.00    Total pack years: 12.50    Types: Cigarettes    Quit date: 03/01/2019    Years since quitting: 3.5   Smokeless tobacco: Never  Vaping Use   Vaping Use: Never used  Substance and Sexual Activity   Alcohol use: Not Currently   Drug use: No   Sexual activity: Not on file  Other Topics Concern   Not on file  Social History Narrative   She resides at [facility] (assisted living facility).   Social Determinants of Health   Financial Resource Strain: Low Risk  (03/29/2019)   Overall Financial Resource Strain (CARDIA)    Difficulty of Paying Living Expenses: Not very hard  Food Insecurity: Not on file  Transportation Needs: No Transportation Needs (03/29/2019)   PRAPARE - Administrator, Civil Service (Medical): No    Lack of Transportation (Non-Medical): No  Physical Activity: Not on file  Stress: Not on file  Social Connections: Moderately Isolated (03/29/2019)   Social Connection and Isolation Panel [NHANES]    Frequency of Communication with Friends and Family: More than three times a week    Frequency of Social Gatherings with Friends and Family: Once a week    Attends Religious Services: 1 to 4 times per year    Active Member of [employer] or Organizations: No    Attends Banker Meetings: Never    Marital Status: Widowed  Intimate Partner Violence: Unknown (03/29/2019)   Humiliation, Afraid, Rape, and Kick questionnaire    Fear of Current or Ex-Partner: No    Emotionally Abused: Not on file    Physically Abused: Not on file    Sexually Abused: Not on  file   Family History  Problem Relation Age of Onset   Colon cancer Paternal Uncle       VITAL SIGNS BP 130/65   Pulse 71   Temp 98.4 F (36.9 C)   Resp 20   Ht 5\' 2"  (1.575 m)   Wt 119 lb 12.8 oz (54.3 kg)   SpO2 99%   BMI 21.91 kg/m   Outpatient Encounter Medications as of 09/16/2022  Medication Sig   acetaminophen (TYLENOL) 650 MG CR tablet Take 650  mg by mouth 3 (three) times daily.   ALPRAZolam (XANAX) 0.25 MG tablet Take 1 tablet (0.25 mg total) by mouth daily. At 4 pm   BREZTRI AEROSPHERE 160-9-4.8 MCG/ACT AERO Inhale 2 puffs into the lungs 2 (two) times daily.   calcium-vitamin D (OSCAL WITH D) 500-5 MG-MCG tablet Take 1 tablet by mouth daily.   Camphor-Menthol-Methyl Sal (SALONPAS) 3.05-31-08 % [facility] Special Instructions: apply one pack to both sides of lower back daily and remove and at HS. for bilateral lower back pain. [DX: Chronic pain syndrome-per NP note]   carbidopa-levodopa (SINEMET IR) 25-100 MG tablet Take 1 tablet by mouth 3 (three) times daily.   clopidogrel (PLAVIX) 75 MG tablet Take 75 mg by mouth daily.   Dextromethorphan-quiNIDine (NUEDEXTA) 20-10 MG capsule Take 1 capsule by mouth 2 (two) times daily.   docusate sodium (COLACE) 100 MG capsule Take 100 mg by mouth daily.   donepezil (ARICEPT) 5 MG tablet Take 5 mg by mouth at bedtime. 9 pm for parkinson's dementia   DULoxetine (CYMBALTA) 60 MG capsule Take 60 mg by mouth daily. 9 am   losartan (COZAAR) 100 MG tablet Take 100 mg by mouth daily.   meloxicam (MOBIC) 15 MG tablet Take 15 mg by mouth daily.   metoprolol tartrate (LOPRESSOR) 25 MG tablet Take 25 mg by mouth 2 (two) times daily.   NON FORMULARY Diet: : Dys 2 (chopped) diet, continue thin liquids; will allow pizza if requested  Liquids:Regular   Nutritional Supplements (ENSURE ENLIVE PO) Take by mouth 3 (three) times daily.   omeprazole (PRILOSEC) 20 MG capsule Take 20 mg by mouth in the morning and at bedtime.    polyethylene glycol (MIRALAX /  GLYCOLAX) 17 g packet Take 17 g by mouth daily.   pramipexole (MIRAPEX) 0.5 MG tablet Take 0.5 mg by mouth at bedtime.   QUEtiapine (SEROQUEL) 25 MG tablet Take 12.5 mg by mouth daily.   Spacer/Aero-Holding Chambers (AEROCHAMBER MAX W/FLOW-VU) MISC by Does not apply route.   traZODone (DESYREL) 50 MG tablet Take 25 mg by mouth at bedtime.   UNABLE TO FIND Take 120 mLs by mouth 3 (three) times daily. Med Name: Med Pass 2.0   No facility-administered encounter medications on file as of 09/16/2022.     SIGNIFICANT DIAGNOSTIC EXAMS  LABS REVIEWED; PREVIOUS    11-07-21: chol 134; ldl 66; trig 173 hdl 33; hepatitis C: nr  01-30-22; wbc 6.0; hgb 14.5; hct 42.1; mcv 96.3 plt 190; glucose 102; bun 21; creat 0.76; k+ 4.0; na++ 140; ca 9.3 gfr >60; protein 6.5; albumin 3.6 hgb a1c 5.2; tsh 1.695 02-24-22: wbc 6.7; hgb 15.6; hct 46.3; mcv 98.7 pt 214; glucose 132; bun 36; creat 1.01; k+ 4.3; na++ 141; ca 9.8 gfr 58 d dimer 0.42; crp 1.1   TODAY  08-28-22: wbc 4.9; hgb 13.7; hct 41.4; mcv 102.2 plt 188; glucose 87; bun 42; creat 0.96; k+ 3.6; na++ 139; ca 9.3 gfr >60; protein 6.6 albumin 3.8 tsh 0.696 09-13-22: tsh 0.805 free t3: 2.9 free t4: 1.03    Review of Systems  Reason unable to perform ROS: unable to fully participate.    Physical Exam Constitutional:      General: She is not in acute distress.    Appearance: She is well-developed. She is not diaphoretic.  Neck:     Thyroid: No thyromegaly.  Cardiovascular:     Rate and Rhythm: Normal rate and regular rhythm.     Pulses: Normal pulses.  Heart sounds: Normal heart sounds.  Pulmonary:     Effort: Pulmonary effort is normal. No respiratory distress.     Breath sounds: Normal breath sounds.  Abdominal:     General: Bowel sounds are normal. There is no distension.     Palpations: Abdomen is soft.     Tenderness: There is no abdominal tenderness.  Musculoskeletal:     Cervical back: Neck supple.     Right lower leg: No edema.      Left lower leg: No edema.     Comments:  Has left side weakness Leans head to the left  Bilateral hand tremor  Has bilateral eyebrow movement Right arm shrug   Lymphadenopathy:     Cervical: No cervical adenopathy.  Skin:    General: Skin is warm and dry.  Neurological:     Mental Status: She is alert. Mental status is at baseline.  Psychiatric:        Mood and Affect: Mood normal.       ASSESSMENT/ PLAN:  TODAY  Parkinson's disease with dyskinesia and fluctuating manifestations Delusional thoughts   Will restart seroquel 12.5 mg daily this is failure number 1    Synthia Innocent NP [facility]  call [phone]

## 2022-09-22 DIAGNOSIS — F22 Delusional disorders: Secondary | ICD-10-CM | POA: Insufficient documentation

## 2022-09-25 ENCOUNTER — Non-Acute Institutional Stay (SKILLED_NURSING_FACILITY): Payer: Medicare Other | Admitting: Adult Health

## 2022-09-25 ENCOUNTER — Encounter: Payer: Self-pay | Admitting: Adult Health

## 2022-09-25 DIAGNOSIS — K5909 Other constipation: Secondary | ICD-10-CM | POA: Diagnosis not present

## 2022-09-25 DIAGNOSIS — K219 Gastro-esophageal reflux disease without esophagitis: Secondary | ICD-10-CM

## 2022-09-25 DIAGNOSIS — J449 Chronic obstructive pulmonary disease, unspecified: Secondary | ICD-10-CM | POA: Diagnosis not present

## 2022-09-25 NOTE — Progress Notes (Signed)
Location:  [facility] Room Number: 22 W Place of Service:  SNF (31)   CODE STATUS: DNR  Allergies  Allergen Reactions   Oxycodone Other (See Comments)    Can tolerate low dose mixed with tynlelol HALLUCINATIONS    Buspar [Buspirone] Other (See Comments)    Unknown   Docosahexaenoic Acid Other (See Comments)   Eicosapentaenoic Acid    Eicosapentaenoic Acid (Epa)    Lutein Nausea And Vomiting   Omega-3 Fatty Acids Other (See Comments)   Other    Aspirin Nausea And Vomiting and Other (See Comments)    Severe stomach pain    Codeine Nausea Only    Makes her feel very sick     Chief Complaint  Patient presents with   Medical Management of Chronic Issues           COPD without exacerbation: GERD without esophagitis: Chronic constipation     HPI:  She is a 77 year old long term resident of this facility being seen for the management of her chronic illnesses: COPD without exacerbation: GERD without esophagitis: Chronic constipation. She continues to lose weight. She had her sinemet lowered to twice daily.  She continues to get out of bed daily. She is resistant to having someone assist with her meals.   Past Medical History:  Diagnosis Date   Anxiety    Arthritis    Complication of anesthesia    Depression    Depression with suicidal ideation 01/21/2021   Dysphasia    GERD (gastroesophageal reflux disease)    Hyperlipidemia    Hypertension    Parkinson's disease    diagnosed at age 37   Parkinson's disease dementia (HCC) 07/31/2020   PONV (postoperative nausea and vomiting)    states it has been a long time since getting sick   Sleep apnea    Has CPAP but doesnt use   Stroke (HCC)    TIA (transient ischemic attack)    not really TIA but abnormal MRI per pt left sided weakness   UTI (lower urinary tract infection)    Vitamin D deficiency     Past Surgical History:  Procedure Laterality Date   BACK SURGERY     Battery change     DBS,  12/2009   BIOPSY N/A 11/29/2013   Procedure: GASTRIC BIOPSY;  Surgeon: West Bali, MD;  Location: [facility];  Service: Endoscopy;  Laterality: N/A;   BURR HOLE W/ STEREOTACTIC INSERTION OF DBS LEADS / INTRAOP MICROELECTRODE RECORDING     2007   CARPAL TUNNEL RELEASE Right    COLONOSCOPY WITH PROPOFOL N/A 11/29/2013   Procedure: COLONOSCOPY WITH PROPOFOL;  Surgeon: West Bali, MD;  Location: [facility];  Service: Endoscopy;  Laterality: N/A;  entered cecum @ [phone]; total cecal withdrawal time 21 minutes    ELBOW SURGERY Left    after fx   ESOPHAGEAL DILATION N/A 11/29/2013   Procedure: ESOPHAGEAL DILATION;  Surgeon: West Bali, MD;  Location: [facility];  Service: Endoscopy;  Laterality: N/A;  [facility] 14/15/16   ESOPHAGOGASTRODUODENOSCOPY (EGD) WITH PROPOFOL N/A 11/29/2013   Procedure: ESOPHAGOGASTRODUODENOSCOPY (EGD) WITH PROPOFOL;  Surgeon: West Bali, MD;  Location: [facility];  Service: Endoscopy;  Laterality: N/A;   FOOT SURGERY Left    "something with 2nd 2."   NECK SURGERY     Fusion   POLYPECTOMY N/A 11/29/2013   Procedure: POLYPECTOMY;  Surgeon: West Bali, MD;  Location: [facility];  Service: Endoscopy;  Laterality: N/A;  Distal Transverse Colon x2    PR ANALYZE NEUROSTIM BRAIN, FIRST 1H  10/15/2012       PULSE GENERATOR IMPLANT Right 03/30/2018   Procedure: Right Implantable pulse generator change;  Surgeon: Maeola Harman, MD;  Location: [facility] OR;  Service: Neurosurgery;  Laterality: Right;   SUBTHALAMIC STIMULATOR BATTERY REPLACEMENT N/A 08/05/2013   Procedure: SUBTHALAMIC STIMULATOR BATTERY REPLACEMENT;  Surgeon: Maeola Harman, MD;  Location: [facility];  Service: Neurosurgery;  Laterality: N/A;   SUBTHALAMIC STIMULATOR BATTERY REPLACEMENT N/A 11/26/2015   Procedure: Implantable pulse generator battery change for deep brain stimulator;  Surgeon: Maeola Harman, MD;  Location: [facility];  Service: Neurosurgery;  Laterality: N/A;   SUBTHALAMIC STIMULATOR BATTERY REPLACEMENT Right 09/13/2020    Procedure: Right chest implantable pulse generator change for depleted battery;  Surgeon: Maeola Harman, MD;  Location: [facility] OR;  Service: Neurosurgery;  Laterality: Right;  3C/RM 18   VAGINAL HYSTERECTOMY     "partial"   WRIST SURGERY Right    after fx    Social History   Socioeconomic History   Marital status: Widowed    Spouse name: Not on file   Number of children: Not on file   Years of education: Not on file   Highest education level: Not on file  Occupational History   Occupation: Retired  Tobacco Use   Smoking status: Former    Packs/day: 0.50    Years: 25.00    Additional pack years: 0.00    Total pack years: 12.50    Types: Cigarettes    Quit date: 03/01/2019    Years since quitting: 3.5   Smokeless tobacco: Never  Vaping Use   Vaping Use: Never used  Substance and Sexual Activity   Alcohol use: Not Currently   Drug use: No   Sexual activity: Not on file  Other Topics Concern   Not on file  Social History Narrative   She resides at [facility] (assisted living facility).   Social Determinants of Health   Financial Resource Strain: Low Risk  (03/29/2019)   Overall Financial Resource Strain (CARDIA)    Difficulty of Paying Living Expenses: Not very hard  Food Insecurity: Not on file  Transportation Needs: No Transportation Needs (03/29/2019)   PRAPARE - Administrator, Civil Service (Medical): No    Lack of Transportation (Non-Medical): No  Physical Activity: Not on file  Stress: Not on file  Social Connections: Moderately Isolated (03/29/2019)   Social Connection and Isolation Panel [NHANES]    Frequency of Communication with Friends and Family: More than three times a week    Frequency of Social Gatherings with Friends and Family: Once a week    Attends Religious Services: 1 to 4 times per year    Active Member of [employer] or Organizations: No    Attends Banker Meetings: Never    Marital Status: Widowed  Intimate Partner Violence:  Unknown (03/29/2019)   Humiliation, Afraid, Rape, and Kick questionnaire    Fear of Current or Ex-Partner: No    Emotionally Abused: Not on file    Physically Abused: Not on file    Sexually Abused: Not on file   Family History  Problem Relation Age of Onset   Colon cancer Paternal Uncle       VITAL SIGNS BP 132/68   Pulse 68   Temp (!) 97.3 F (36.3 C)   Resp 20   Ht 5\' 2"  (1.575 m)   Wt 115  lb 8 oz (52.4 kg)   SpO2 98%   BMI 21.13 kg/m   Outpatient Encounter Medications as of 09/25/2022  Medication Sig   ALPRAZolam (XANAX) 0.25 MG tablet Take 1 tablet (0.25 mg total) by mouth daily. At 4 pm   BREZTRI AEROSPHERE 160-9-4.8 MCG/ACT AERO Inhale 2 puffs into the lungs 2 (two) times daily.   calcium-vitamin D (OSCAL WITH D) 500-5 MG-MCG tablet Take 1 tablet by mouth daily.   Camphor-Menthol-Methyl Sal (SALONPAS) 3.05-31-08 % [facility] Special Instructions: apply one pack to both sides of lower back daily and remove and at HS. for bilateral lower back pain. [DX: Chronic pain syndrome-per NP note]   carbidopa-levodopa (SINEMET IR) 25-100 MG tablet Take 1 tablet by mouth in the morning and at bedtime.   clopidogrel (PLAVIX) 75 MG tablet Take 75 mg by mouth daily.   Dextromethorphan-quiNIDine (NUEDEXTA) 20-10 MG capsule Take 1 capsule by mouth 2 (two) times daily.   docusate sodium (COLACE) 100 MG capsule Take 100 mg by mouth daily.   donepezil (ARICEPT) 5 MG tablet Take 5 mg by mouth at bedtime. 9 pm for parkinson's dementia   DULoxetine (CYMBALTA) 60 MG capsule Take 60 mg by mouth daily. 9 am   losartan (COZAAR) 100 MG tablet Take 100 mg by mouth daily.   meloxicam (MOBIC) 15 MG tablet Take 15 mg by mouth daily.   metoprolol tartrate (LOPRESSOR) 25 MG tablet Take 25 mg by mouth 2 (two) times daily.   NON FORMULARY Diet: : Dys 2 (chopped) diet, continue thin liquids; will allow pizza if requested  Liquids:Regular   Nutritional Supplements (ENSURE ENLIVE PO) Take by mouth 3 (three) times  daily.   omeprazole (PRILOSEC) 20 MG capsule Take 20 mg by mouth in the morning and at bedtime.    polyethylene glycol (MIRALAX / GLYCOLAX) 17 g packet Take 17 g by mouth daily.   pramipexole (MIRAPEX) 0.5 MG tablet Take 0.5 mg by mouth at bedtime.   QUEtiapine (SEROQUEL) 25 MG tablet Take 12.5 mg by mouth daily.   Spacer/Aero-Holding Chambers (AEROCHAMBER MAX W/FLOW-VU) MISC by Does not apply route.   traZODone (DESYREL) 50 MG tablet Take 25 mg by mouth at bedtime.   UNABLE TO FIND Take 120 mLs by mouth 3 (three) times daily. Med Name: Med Pass 2.0   acetaminophen (TYLENOL) 650 MG CR tablet Take 650 mg by mouth 3 (three) times daily.   No facility-administered encounter medications on file as of 09/25/2022.     SIGNIFICANT DIAGNOSTIC EXAMS   LABS REVIEWED; PREVIOUS    11-07-21: chol 134; ldl 66; trig 173 hdl 33; hepatitis C: nr  01-30-22; wbc 6.0; hgb 14.5; hct 42.1; mcv 96.3 plt 190; glucose 102; bun 21; creat 0.76; k+ 4.0; na++ 140; ca 9.3 gfr >60; protein 6.5; albumin 3.6 hgb a1c 5.2; tsh 1.695 02-24-22: wbc 6.7; hgb 15.6; hct 46.3; mcv 98.7 pt 214; glucose 132; bun 36; creat 1.01; k+ 4.3; na++ 141; ca 9.8 gfr 58 d dimer 0.42; crp 1.1   TODAY  08-28-22: wbc 4.9; hgb 13.7; hct 41.4; mcv 102.2; plt 188; glucose 87; bun 42; creat 0.96 ;k+ 3.6; na++ 139; ca 9.3 gfr >60 protein 6.6 albumin 3.8 tsh 0.696 09-13-22: tsh 0.805 free t3: 2.9 free t4: 1.03  Review of Systems  Reason unable to perform ROS: unable to fully participate.   Physical Exam Constitutional:      General: She is not in acute distress.    Appearance: She is well-developed. She is not  diaphoretic.  Neck:     Thyroid: No thyromegaly.  Cardiovascular:     Rate and Rhythm: Normal rate and regular rhythm.     Pulses: Normal pulses.     Heart sounds: Normal heart sounds.  Pulmonary:     Effort: Pulmonary effort is normal. No respiratory distress.     Breath sounds: Normal breath sounds.  Abdominal:     General: Bowel  sounds are normal. There is no distension.     Palpations: Abdomen is soft.     Tenderness: There is no abdominal tenderness.  Musculoskeletal:     Cervical back: Neck supple.     Right lower leg: No edema.     Left lower leg: No edema.     Comments: Has left side weakness Leans head to the left  Bilateral hand tremor  Has bilateral eyebrow movement Right arm shrug    Lymphadenopathy:     Cervical: No cervical adenopathy.  Skin:    General: Skin is warm and dry.  Neurological:     Mental Status: She is alert. Mental status is at baseline.  Psychiatric:        Mood and Affect: Mood normal.    ASSESSMENT/ PLAN:  TODAY  COPD without exacerbation: will continue breztri aerosphere 160.4-4.8 mcg 2 puffs twice daily   2. GERD without esophagitis: will continue prilosec 20 mg twice daily   3. Chronic constipation: will continue colace daily; miralax daily   PREVIOUS   4. Chronic pain syndrome will continue cymbalta 60 mg daily (also takes for mood state) salon patches to back; tylenol 650 mg three times daily    5. 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  Essential hypertension: b/p 132/68 will continue cozaar 100 mg daily and lopressor 25 mg twice daily    7. Parkinson's disease is stable is status post deep brain stimulator will continue sinemet IR 25/100 mg twice daily and mirapex 0.5 mg nightly   8. OAB (over active bladder) is off ditropan is completely incontinent of bladder.   9. Mixed hyperlipidemia: ldl 66 will continue to monitor   10. PBA: will continue neudexta 10/20 mg twice daily   11. 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

## 2022-10-01 ENCOUNTER — Other Ambulatory Visit: Payer: Self-pay | Admitting: Adult Health

## 2022-10-01 MED ORDER — ALPRAZOLAM 0.25 MG PO TABS: 0.2500 mg | ORAL_TABLET | Freq: Every day | ORAL | 0 refills | Status: DC

## 2022-10-02 ENCOUNTER — Encounter: Payer: Self-pay | Admitting: Adult Health

## 2022-10-02 ENCOUNTER — Non-Acute Institutional Stay (SKILLED_NURSING_FACILITY): Payer: Medicare Other | Admitting: Adult Health

## 2022-10-02 DIAGNOSIS — G20A1 Parkinson's disease without dyskinesia, without mention of fluctuations: Secondary | ICD-10-CM | POA: Diagnosis not present

## 2022-10-02 DIAGNOSIS — G20B2 Parkinson's disease with dyskinesia, with fluctuations: Secondary | ICD-10-CM | POA: Diagnosis not present

## 2022-10-02 DIAGNOSIS — F02C2 Dementia in other diseases classified elsewhere, severe, with psychotic disturbance: Secondary | ICD-10-CM | POA: Diagnosis not present

## 2022-10-02 DIAGNOSIS — F482 Pseudobulbar affect: Secondary | ICD-10-CM

## 2022-10-02 NOTE — Progress Notes (Signed)
Location:  [facility] Room Number: 56 W Place of Service:  SNF (31)   CODE STATUS:DNR  Allergies  Allergen Reactions   Oxycodone Other (See Comments)    Can tolerate low dose mixed with tynlelol HALLUCINATIONS    Buspar [Buspirone] Other (See Comments)    Unknown   Docosahexaenoic Acid Other (See Comments)   Eicosapentaenoic Acid    Eicosapentaenoic Acid (Epa)    Lutein Nausea And Vomiting   Omega-3 Fatty Acids Other (See Comments)   Other    Aspirin Nausea And Vomiting and Other (See Comments)    Severe stomach pain    Codeine Nausea Only    Makes her feel very sick     Chief Complaint  Patient presents with   Acute Visit    Status change     HPI:  She continues to decline globally. She requires more assistance with her adls including eating. She is less able to make her needs known. She cries frequently and uncontrollably. There are no indications of pain present. She is on neudexta for PBA; more than likely this medication is not effective in helping her to manage her mood state.   Past Medical History:  Diagnosis Date   Anxiety    Arthritis    Complication of anesthesia    Depression    Depression with suicidal ideation 01/21/2021   Dysphasia    GERD (gastroesophageal reflux disease)    Hyperlipidemia    Hypertension    Parkinson's disease    diagnosed at age 58   Parkinson's disease dementia (HCC) 07/31/2020   PONV (postoperative nausea and vomiting)    states it has been a long time since getting sick   Sleep apnea    Has CPAP but doesnt use   Stroke (HCC)    TIA (transient ischemic attack)    not really TIA but abnormal MRI per pt left sided weakness   UTI (lower urinary tract infection)    Vitamin D deficiency     Past Surgical History:  Procedure Laterality Date   BACK SURGERY     Battery change     DBS, 12/2009   BIOPSY N/A 11/29/2013   Procedure: GASTRIC BIOPSY;  Surgeon: West Bali, MD;  Location: [facility];  Service:  Endoscopy;  Laterality: N/A;   BURR HOLE W/ STEREOTACTIC INSERTION OF DBS LEADS / INTRAOP MICROELECTRODE RECORDING     2007   CARPAL TUNNEL RELEASE Right    COLONOSCOPY WITH PROPOFOL N/A 11/29/2013   Procedure: COLONOSCOPY WITH PROPOFOL;  Surgeon: West Bali, MD;  Location: [facility];  Service: Endoscopy;  Laterality: N/A;  entered cecum @ [phone]; total cecal withdrawal time 21 minutes    ELBOW SURGERY Left    after fx   ESOPHAGEAL DILATION N/A 11/29/2013   Procedure: ESOPHAGEAL DILATION;  Surgeon: West Bali, MD;  Location: [facility];  Service: Endoscopy;  Laterality: N/A;  [facility] 14/15/16   ESOPHAGOGASTRODUODENOSCOPY (EGD) WITH PROPOFOL N/A 11/29/2013   Procedure: ESOPHAGOGASTRODUODENOSCOPY (EGD) WITH PROPOFOL;  Surgeon: West Bali, MD;  Location: [facility];  Service: Endoscopy;  Laterality: N/A;   FOOT SURGERY Left    "something with 2nd 2."   NECK SURGERY     Fusion   POLYPECTOMY N/A 11/29/2013   Procedure: POLYPECTOMY;  Surgeon: West Bali, MD;  Location: [facility];  Service: Endoscopy;  Laterality: N/A;  Distal Transverse Colon x2    PR ANALYZE NEUROSTIM BRAIN, FIRST 1H  10/15/2012  PULSE GENERATOR IMPLANT Right 03/30/2018   Procedure: Right Implantable pulse generator change;  Surgeon: Maeola Harman, MD;  Location: [facility] OR;  Service: Neurosurgery;  Laterality: Right;   SUBTHALAMIC STIMULATOR BATTERY REPLACEMENT N/A 08/05/2013   Procedure: SUBTHALAMIC STIMULATOR BATTERY REPLACEMENT;  Surgeon: Maeola Harman, MD;  Location: [facility];  Service: Neurosurgery;  Laterality: N/A;   SUBTHALAMIC STIMULATOR BATTERY REPLACEMENT N/A 11/26/2015   Procedure: Implantable pulse generator battery change for deep brain stimulator;  Surgeon: Maeola Harman, MD;  Location: [facility];  Service: Neurosurgery;  Laterality: N/A;   SUBTHALAMIC STIMULATOR BATTERY REPLACEMENT Right 09/13/2020   Procedure: Right chest implantable pulse generator change for depleted battery;  Surgeon: Maeola Harman, MD;  Location:  [facility] OR;  Service: Neurosurgery;  Laterality: Right;  3C/RM 18   VAGINAL HYSTERECTOMY     "partial"   WRIST SURGERY Right    after fx    Social History   Socioeconomic History   Marital status: Widowed    Spouse name: Not on file   Number of children: Not on file   Years of education: Not on file   Highest education level: Not on file  Occupational History   Occupation: Retired  Tobacco Use   Smoking status: Former    Packs/day: 0.50    Years: 25.00    Additional pack years: 0.00    Total pack years: 12.50    Types: Cigarettes    Quit date: 03/01/2019    Years since quitting: 3.5   Smokeless tobacco: Never  Vaping Use   Vaping Use: Never used  Substance and Sexual Activity   Alcohol use: Not Currently   Drug use: No   Sexual activity: Not on file  Other Topics Concern   Not on file  Social History Narrative   She resides at [facility] (assisted living facility).   Social Determinants of Health   Financial Resource Strain: Low Risk  (03/29/2019)   Overall Financial Resource Strain (CARDIA)    Difficulty of Paying Living Expenses: Not very hard  Food Insecurity: Not on file  Transportation Needs: No Transportation Needs (03/29/2019)   PRAPARE - Administrator, Civil Service (Medical): No    Lack of Transportation (Non-Medical): No  Physical Activity: Not on file  Stress: Not on file  Social Connections: Moderately Isolated (03/29/2019)   Social Connection and Isolation Panel [NHANES]    Frequency of Communication with Friends and Family: More than three times a week    Frequency of Social Gatherings with Friends and Family: Once a week    Attends Religious Services: 1 to 4 times per year    Active Member of [employer] or Organizations: No    Attends Banker Meetings: Never    Marital Status: Widowed  Intimate Partner Violence: Unknown (03/29/2019)   Humiliation, Afraid, Rape, and Kick questionnaire    Fear of Current or Ex-Partner: No     Emotionally Abused: Not on file    Physically Abused: Not on file    Sexually Abused: Not on file   Family History  Problem Relation Age of Onset   Colon cancer Paternal Uncle       VITAL SIGNS BP 133/63   Pulse (!) 58   Ht 5\' 2"  (1.575 m)   Wt 115 lb 12.8 oz (52.5 kg)   BMI 21.18 kg/m   Outpatient Encounter Medications as of 10/02/2022  Medication Sig   acetaminophen (TYLENOL) 650 MG CR tablet Take 650 mg by mouth 3 (  three) times daily.   ALPRAZolam (XANAX) 0.25 MG tablet Take 1 tablet (0.25 mg total) by mouth daily. At 4 pm   BREZTRI AEROSPHERE 160-9-4.8 MCG/ACT AERO Inhale 2 puffs into the lungs 2 (two) times daily.   calcium-vitamin D (OSCAL WITH D) 500-5 MG-MCG tablet Take 1 tablet by mouth daily.   Camphor-Menthol-Methyl Sal (SALONPAS) 3.05-31-08 % [facility] Special Instructions: apply one pack to both sides of lower back daily and remove and at HS. for bilateral lower back pain. [DX: Chronic pain syndrome-per NP note]   carbidopa-levodopa (SINEMET IR) 25-100 MG tablet Take 1 tablet by mouth in the morning and at bedtime.   clopidogrel (PLAVIX) 75 MG tablet Take 75 mg by mouth daily.   docusate sodium (COLACE) 100 MG capsule Take 100 mg by mouth daily.   donepezil (ARICEPT) 5 MG tablet Take 5 mg by mouth at bedtime. 9 pm for parkinson's dementia   DULoxetine (CYMBALTA) 60 MG capsule Take 60 mg by mouth daily. 9 am   losartan (COZAAR) 100 MG tablet Take 100 mg by mouth daily.   meloxicam (MOBIC) 15 MG tablet Take 15 mg by mouth daily.   metoprolol tartrate (LOPRESSOR) 25 MG tablet Take 25 mg by mouth 2 (two) times daily.   NON FORMULARY Diet: : Dys 2 (chopped) diet, continue thin liquids; will allow pizza if requested  Liquids:Regular   Nutritional Supplements (ENSURE ENLIVE PO) Take by mouth 3 (three) times daily.   omeprazole (PRILOSEC) 20 MG capsule Take 20 mg by mouth in the morning and at bedtime.    polyethylene glycol (MIRALAX / GLYCOLAX) 17 g packet Take 17 g by mouth  daily.   pramipexole (MIRAPEX) 0.5 MG tablet Take 0.5 mg by mouth at bedtime.   QUEtiapine (SEROQUEL) 25 MG tablet Take 12.5 mg by mouth daily.   Spacer/Aero-Holding Chambers (AEROCHAMBER MAX W/FLOW-VU) MISC by Does not apply route.   traZODone (DESYREL) 50 MG tablet Take 25 mg by mouth at bedtime.   [DISCONTINUED] Dextromethorphan-quiNIDine (NUEDEXTA) 20-10 MG capsule Take 1 capsule by mouth 2 (two) times daily.   [DISCONTINUED] UNABLE TO FIND Take 120 mLs by mouth 3 (three) times daily. Med Name: Med Pass 2.0   No facility-administered encounter medications on file as of 10/02/2022.     SIGNIFICANT DIAGNOSTIC EXAMS  LABS REVIEWED; PREVIOUS    11-07-21: chol 134; ldl 66; trig 173 hdl 33; hepatitis C: nr  01-30-22; wbc 6.0; hgb 14.5; hct 42.1; mcv 96.3 plt 190; glucose 102; bun 21; creat 0.76; k+ 4.0; na++ 140; ca 9.3 gfr >60; protein 6.5; albumin 3.6 hgb a1c 5.2; tsh 1.695 02-24-22: wbc 6.7; hgb 15.6; hct 46.3; mcv 98.7 pt 214; glucose 132; bun 36; creat 1.01; k+ 4.3; na++ 141; ca 9.8 gfr 58 d dimer 0.42; crp 1.1  08-28-22: wbc 4.9; hgb 13.7; hct 41.4; mcv 102.2; plt 188; glucose 87; bun 42; creat 0.96 ;k+ 3.6; na++ 139; ca 9.3 gfr >60 protein 6.6 albumin 3.8 tsh 0.696 09-13-22: tsh 0.805 free t3: 2.9 free t4: 1.03  NO NEW LABS.   Review of Systems  Reason unable to perform ROS: unable to fully participate.    Physical Exam Constitutional:      General: She is not in acute distress.    Appearance: She is well-developed. She is not diaphoretic.  Neck:     Thyroid: No thyromegaly.  Cardiovascular:     Rate and Rhythm: Normal rate and regular rhythm.     Pulses: Normal pulses.  Heart sounds: Normal heart sounds.  Pulmonary:     Effort: Pulmonary effort is normal. No respiratory distress.     Breath sounds: Normal breath sounds.  Abdominal:     General: Bowel sounds are normal. There is no distension.     Palpations: Abdomen is soft.     Tenderness: There is no abdominal  tenderness.  Musculoskeletal:     Cervical back: Neck supple.     Right lower leg: No edema.     Left lower leg: No edema.     Comments: g: No edema.     Comments: Has left side weakness Leans head to the left  Bilateral hand tremor  Has bilateral eyebrow movement Right arm shrug     Lymphadenopathy:     Cervical: No cervical adenopathy.  Skin:    General: Skin is warm and dry.  Neurological:     Mental Status: She is alert. Mental status is at baseline.  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

## 2022-10-10 ENCOUNTER — Encounter: Payer: Self-pay | Admitting: Adult Health

## 2022-10-10 ENCOUNTER — Non-Acute Institutional Stay (SKILLED_NURSING_FACILITY): Payer: Medicare Other | Admitting: Adult Health

## 2022-10-10 DIAGNOSIS — J449 Chronic obstructive pulmonary disease, unspecified: Secondary | ICD-10-CM | POA: Diagnosis not present

## 2022-10-10 DIAGNOSIS — G20B2 Parkinson's disease with dyskinesia, with fluctuations: Secondary | ICD-10-CM

## 2022-10-10 DIAGNOSIS — R627 Adult failure to thrive: Secondary | ICD-10-CM

## 2022-10-10 NOTE — Progress Notes (Signed)
Location:  [facility]   Place of Service:   SNF    CODE STATUS: dnr   Allergies  Allergen Reactions   Oxycodone Other (See Comments)    Can tolerate low dose mixed with tynlelol HALLUCINATIONS    Buspar [Buspirone] Other (See Comments)    Unknown   Docosahexaenoic Acid Other (See Comments)   Eicosapentaenoic Acid    Eicosapentaenoic Acid (Epa)    Lutein Nausea And Vomiting   Omega-3 Fatty Acids Other (See Comments)   Other    Aspirin Nausea And Vomiting and Other (See Comments)    Severe stomach pain    Codeine Nausea Only    Makes her feel very sick     Chief Complaint  Patient presents with   Acute Visit    Care plan meeting    HPI:  We have come together for her care plan meeting. Family present . BIMS no; mood: no. She is nonambulatory with no falls. She requires max assist with her adls. She is incontinent of bowel and bladder. Dietary:  D3 diet; feeds self; weight 115.3 pounds; appetite poor.  Therapy: none at this time.  Activities: 1:1 visits. Her social worker would like to focus upon comfort care only to allow her to have a regular diet.  She will continue to be followed for her chronic illnesses including:  Failure to thrive in adult  COPD without exacerbation  Parkinson's disease with dyskinesia and fluctuating manifestations   Past Medical History:  Diagnosis Date   Anxiety    Arthritis    Complication of anesthesia    Depression    Depression with suicidal ideation 01/21/2021   Dysphasia    GERD (gastroesophageal reflux disease)    Hyperlipidemia    Hypertension    Parkinson's disease    diagnosed at age 44   Parkinson's disease dementia (HCC) 07/31/2020   PONV (postoperative nausea and vomiting)    states it has been a long time since getting sick   Sleep apnea    Has CPAP but doesnt use   Stroke (HCC)    TIA (transient ischemic attack)    not really TIA but abnormal MRI per pt left sided weakness   UTI (lower urinary tract infection)     Vitamin D deficiency     Past Surgical History:  Procedure Laterality Date   BACK SURGERY     Battery change     DBS, 12/2009   BIOPSY N/A 11/29/2013   Procedure: GASTRIC BIOPSY;  Surgeon: West Bali, MD;  Location: [facility];  Service: Endoscopy;  Laterality: N/A;   BURR HOLE W/ STEREOTACTIC INSERTION OF DBS LEADS / INTRAOP MICROELECTRODE RECORDING     2007   CARPAL TUNNEL RELEASE Right    COLONOSCOPY WITH PROPOFOL N/A 11/29/2013   Procedure: COLONOSCOPY WITH PROPOFOL;  Surgeon: West Bali, MD;  Location: [facility];  Service: Endoscopy;  Laterality: N/A;  entered cecum @ [phone]; total cecal withdrawal time 21 minutes    ELBOW SURGERY Left    after fx   ESOPHAGEAL DILATION N/A 11/29/2013   Procedure: ESOPHAGEAL DILATION;  Surgeon: West Bali, MD;  Location: [facility];  Service: Endoscopy;  Laterality: N/A;  [facility] 14/15/16   ESOPHAGOGASTRODUODENOSCOPY (EGD) WITH PROPOFOL N/A 11/29/2013   Procedure: ESOPHAGOGASTRODUODENOSCOPY (EGD) WITH PROPOFOL;  Surgeon: West Bali, MD;  Location: [facility];  Service: Endoscopy;  Laterality: N/A;   FOOT SURGERY Left    "something with 2nd 2."   NECK SURGERY  Fusion   POLYPECTOMY N/A 11/29/2013   Procedure: POLYPECTOMY;  Surgeon: West Bali, MD;  Location: [facility];  Service: Endoscopy;  Laterality: N/A;  Distal Transverse Colon x2    PR ANALYZE NEUROSTIM BRAIN, FIRST 1H  10/15/2012       PULSE GENERATOR IMPLANT Right 03/30/2018   Procedure: Right Implantable pulse generator change;  Surgeon: Maeola Harman, MD;  Location: [facility] OR;  Service: Neurosurgery;  Laterality: Right;   SUBTHALAMIC STIMULATOR BATTERY REPLACEMENT N/A 08/05/2013   Procedure: SUBTHALAMIC STIMULATOR BATTERY REPLACEMENT;  Surgeon: Maeola Harman, MD;  Location: [facility];  Service: Neurosurgery;  Laterality: N/A;   SUBTHALAMIC STIMULATOR BATTERY REPLACEMENT N/A 11/26/2015   Procedure: Implantable pulse generator battery change for deep brain stimulator;  Surgeon: Maeola Harman, MD;   Location: [facility];  Service: Neurosurgery;  Laterality: N/A;   SUBTHALAMIC STIMULATOR BATTERY REPLACEMENT Right 09/13/2020   Procedure: Right chest implantable pulse generator change for depleted battery;  Surgeon: Maeola Harman, MD;  Location: [facility] OR;  Service: Neurosurgery;  Laterality: Right;  3C/RM 18   VAGINAL HYSTERECTOMY     "partial"   WRIST SURGERY Right    after fx    Social History   Socioeconomic History   Marital status: Widowed    Spouse name: Not on file   Number of children: Not on file   Years of education: Not on file   Highest education level: Not on file  Occupational History   Occupation: Retired  Tobacco Use   Smoking status: Former    Packs/day: 0.50    Years: 25.00    Additional pack years: 0.00    Total pack years: 12.50    Types: Cigarettes    Quit date: 03/01/2019    Years since quitting: 3.6   Smokeless tobacco: Never  Vaping Use   Vaping Use: Never used  Substance and Sexual Activity   Alcohol use: Not Currently   Drug use: No   Sexual activity: Not on file  Other Topics Concern   Not on file  Social History Narrative   She resides at [facility] (assisted living facility).   Social Determinants of Health   Financial Resource Strain: Low Risk  (03/29/2019)   Overall Financial Resource Strain (CARDIA)    Difficulty of Paying Living Expenses: Not very hard  Food Insecurity: Not on file  Transportation Needs: No Transportation Needs (03/29/2019)   PRAPARE - Administrator, Civil Service (Medical): No    Lack of Transportation (Non-Medical): No  Physical Activity: Not on file  Stress: Not on file  Social Connections: Moderately Isolated (03/29/2019)   Social Connection and Isolation Panel [NHANES]    Frequency of Communication with Friends and Family: More than three times a week    Frequency of Social Gatherings with Friends and Family: Once a week    Attends Religious Services: 1 to 4 times per year    Active Member of [employer]  or Organizations: No    Attends Banker Meetings: Never    Marital Status: Widowed  Intimate Partner Violence: Unknown (03/29/2019)   Humiliation, Afraid, Rape, and Kick questionnaire    Fear of Current or Ex-Partner: No    Emotionally Abused: Not on file    Physically Abused: Not on file    Sexually Abused: Not on file   Family History  Problem Relation Age of Onset   Colon cancer Paternal Uncle       VITAL SIGNS BP 136/72   Pulse  86   Temp (!) 97.2 F (36.2 C)   Resp 18   Ht 5\' 2"  (1.575 m)   Wt 115 lb 12.8 oz (52.5 kg)   SpO2 98%   BMI 21.18 kg/m   Outpatient Encounter Medications as of 10/10/2022  Medication Sig   acetaminophen (TYLENOL) 650 MG CR tablet Take 650 mg by mouth 3 (three) times daily.   ALPRAZolam (XANAX) 0.25 MG tablet Take 1 tablet (0.25 mg total) by mouth daily. At 4 pm   BREZTRI AEROSPHERE 160-9-4.8 MCG/ACT AERO Inhale 2 puffs into the lungs 2 (two) times daily.   calcium-vitamin D (OSCAL WITH D) 500-5 MG-MCG tablet Take 1 tablet by mouth daily.   Camphor-Menthol-Methyl Sal (SALONPAS) 3.05-31-08 % [facility] Special Instructions: apply one pack to both sides of lower back daily and remove and at HS. for bilateral lower back pain. [DX: Chronic pain syndrome-per NP note]   carbidopa-levodopa (SINEMET IR) 25-100 MG tablet Take 1 tablet by mouth in the morning and at bedtime.   clopidogrel (PLAVIX) 75 MG tablet Take 75 mg by mouth daily.   docusate sodium (COLACE) 100 MG capsule Take 100 mg by mouth daily.   donepezil (ARICEPT) 5 MG tablet Take 5 mg by mouth at bedtime. 9 pm for parkinson's dementia   DULoxetine (CYMBALTA) 60 MG capsule Take 60 mg by mouth daily. 9 am   losartan (COZAAR) 100 MG tablet Take 100 mg by mouth daily.   meloxicam (MOBIC) 15 MG tablet Take 15 mg by mouth daily.   metoprolol tartrate (LOPRESSOR) 25 MG tablet Take 25 mg by mouth 2 (two) times daily.   NON FORMULARY Diet: : Dys 2 (chopped) diet, continue thin liquids; will allow  pizza if requested  Liquids:Regular   Nutritional Supplements (ENSURE ENLIVE PO) Take by mouth 3 (three) times daily.   omeprazole (PRILOSEC) 20 MG capsule Take 20 mg by mouth in the morning and at bedtime.    polyethylene glycol (MIRALAX / GLYCOLAX) 17 g packet Take 17 g by mouth daily.   pramipexole (MIRAPEX) 0.5 MG tablet Take 0.5 mg by mouth at bedtime.   QUEtiapine (SEROQUEL) 25 MG tablet Take 12.5 mg by mouth daily.   Spacer/Aero-Holding Chambers (AEROCHAMBER MAX W/FLOW-VU) MISC by Does not apply route.   traZODone (DESYREL) 50 MG tablet Take 25 mg by mouth at bedtime.   No facility-administered encounter medications on file as of 10/10/2022.     SIGNIFICANT DIAGNOSTIC EXAMS   LABS REVIEWED; PREVIOUS    11-07-21: chol 134; ldl 66; trig 173 hdl 33; hepatitis C: nr  01-30-22; wbc 6.0; hgb 14.5; hct 42.1; mcv 96.3 plt 190; glucose 102; bun 21; creat 0.76; k+ 4.0; na++ 140; ca 9.3 gfr >60; protein 6.5; albumin 3.6 hgb a1c 5.2; tsh 1.695 02-24-22: wbc 6.7; hgb 15.6; hct 46.3; mcv 98.7 pt 214; glucose 132; bun 36; creat 1.01; k+ 4.3; na++ 141; ca 9.8 gfr 58 d dimer 0.42; crp 1.1  08-28-22: wbc 4.9; hgb 13.7; hct 41.4; mcv 102.2; plt 188; glucose 87; bun 42; creat 0.96 ;k+ 3.6; na++ 139; ca 9.3 gfr >60 protein 6.6 albumin 3.8 tsh 0.696 09-13-22: tsh 0.805 free t3: 2.9 free t4: 1.03  NO NEW LABS.   Review of Systems  Reason unable to perform ROS: unable to participate.   Physical Exam Constitutional:      General: She is not in acute distress.    Appearance: She is well-developed. She is not diaphoretic.  Neck:     Thyroid: No thyromegaly.  Cardiovascular:     Rate and Rhythm: Normal rate and regular rhythm.     Pulses: Normal pulses.     Heart sounds: Normal heart sounds.  Pulmonary:     Effort: Pulmonary effort is normal. No respiratory distress.     Breath sounds: Normal breath sounds.  Abdominal:     General: Bowel sounds are normal. There is no distension.     Palpations:  Abdomen is soft.     Tenderness: There is no abdominal tenderness.  Musculoskeletal:     Cervical back: Neck supple.     Right lower leg: No edema.     Left lower leg: No edema.     Comments:  Has left side weakness Leans head to the left  Bilateral hand tremor  Has bilateral eyebrow movement Right arm shrug Has contractures  Lymphadenopathy:     Cervical: No cervical adenopathy.  Skin:    General: Skin is warm and dry.  Neurological:     Mental Status: She is alert. Mental status is at baseline.  Psychiatric:        Mood and Affect: Mood normal.       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  Will continue to monitor her status.    Time spent with patient: 40 minutes: medications; dietary; plan of care.    Synthia Innocent NP [facility]  call [phone]

## 2022-10-16 ENCOUNTER — Encounter: Payer: Self-pay | Admitting: Adult Health

## 2022-10-16 ENCOUNTER — Non-Acute Institutional Stay (SKILLED_NURSING_FACILITY): Payer: Medicare Other | Admitting: Adult Health

## 2022-10-16 DIAGNOSIS — G894 Chronic pain syndrome: Secondary | ICD-10-CM

## 2022-10-16 DIAGNOSIS — M5416 Radiculopathy, lumbar region: Secondary | ICD-10-CM

## 2022-10-16 NOTE — Progress Notes (Signed)
Location:  [facility] Room Number: 108 Place of Service:  SNF (31)   CODE STATUS: DNR  Allergies  Allergen Reactions   Oxycodone Other (See Comments)    Can tolerate low dose mixed with tynlelol HALLUCINATIONS    Buspar [Buspirone] Other (See Comments)    Unknown   Docosahexaenoic Acid Other (See Comments)   Eicosapentaenoic Acid    Eicosapentaenoic Acid (Epa)    Lutein Nausea And Vomiting   Omega-3 Fatty Acids Other (See Comments)   Other    Aspirin Nausea And Vomiting and Other (See Comments)    Severe stomach pain    Codeine Nausea Only    Makes her feel very sick     Chief Complaint  Patient presents with   Acute Visit    Bruising    HPI:  She is presently taking cymbalta and mobic together. This can lead to easy bruising. She can move her wheelchair around to her snacks. She does bump into things causing her to bruise. There are no reports of recent falls. There are no reports of uncontrolled pain. She would probably benefit from lowering her mobic dose.   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   PONV (postoperative nausea and vomiting)    states it has been a long time since getting sick   Sleep apnea    Has CPAP but doesnt use   Stroke (HCC)    TIA (transient ischemic attack)    not really TIA but abnormal MRI per pt left sided weakness   UTI (lower urinary tract infection)    Vitamin D deficiency     Past Surgical History:  Procedure Laterality Date   BACK SURGERY     Battery change     DBS, 12/2009   BIOPSY N/A 11/29/2013   Procedure: GASTRIC BIOPSY;  Surgeon: West Bali, MD;  Location: [facility];  Service: Endoscopy;  Laterality:  N/A;   BURR HOLE W/ STEREOTACTIC INSERTION OF DBS LEADS / INTRAOP MICROELECTRODE RECORDING     2007   CARPAL TUNNEL RELEASE Right    COLONOSCOPY WITH PROPOFOL N/A 11/29/2013   Procedure: COLONOSCOPY WITH PROPOFOL;  Surgeon: West Bali, MD;  Location: [facility];  Service: Endoscopy;  Laterality: N/A;  entered cecum @ [phone]; total cecal withdrawal time 21 minutes    ELBOW SURGERY Left    after fx   ESOPHAGEAL DILATION N/A 11/29/2013   Procedure: ESOPHAGEAL DILATION;  Surgeon: West Bali, MD;  Location: [facility];  Service: Endoscopy;  Laterality: N/A;  [facility] 14/15/16   ESOPHAGOGASTRODUODENOSCOPY (EGD) WITH PROPOFOL N/A 11/29/2013   Procedure: ESOPHAGOGASTRODUODENOSCOPY (EGD) WITH PROPOFOL;  Surgeon: West Bali, MD;  Location: [facility];  Service: Endoscopy;  Laterality: N/A;   FOOT SURGERY Left    "something with 2nd 2."   NECK SURGERY     Fusion   POLYPECTOMY N/A 11/29/2013   Procedure: POLYPECTOMY;  Surgeon: West Bali, MD;  Location: [facility];  Service: Endoscopy;  Laterality: N/A;  Distal Transverse Colon x2    PR ANALYZE NEUROSTIM BRAIN, FIRST 1H  10/15/2012       PULSE  GENERATOR IMPLANT Right 03/30/2018   Procedure: Right Implantable pulse generator change;  Surgeon: Maeola Harman, MD;  Location: [facility] OR;  Service: Neurosurgery;  Laterality: Right;   SUBTHALAMIC STIMULATOR BATTERY REPLACEMENT N/A 08/05/2013   Procedure: SUBTHALAMIC STIMULATOR BATTERY REPLACEMENT;  Surgeon: Maeola Harman, MD;  Location: [facility];  Service: Neurosurgery;  Laterality: N/A;   SUBTHALAMIC STIMULATOR BATTERY REPLACEMENT N/A 11/26/2015   Procedure: Implantable pulse generator battery change for deep brain stimulator;  Surgeon: Maeola Harman, MD;  Location: [facility];  Service: Neurosurgery;  Laterality: N/A;   SUBTHALAMIC STIMULATOR BATTERY REPLACEMENT Right 09/13/2020   Procedure: Right chest implantable pulse generator change for depleted battery;  Surgeon: Maeola Harman, MD;  Location: [facility] OR;  Service:  Neurosurgery;  Laterality: Right;  3C/RM 18   VAGINAL HYSTERECTOMY     "partial"   WRIST SURGERY Right    after fx    Social History   Socioeconomic History   Marital status: Widowed    Spouse name: Not on file   Number of children: Not on file   Years of education: Not on file   Highest education level: Not on file  Occupational History   Occupation: Retired  Tobacco Use   Smoking status: Former    Packs/day: 0.50    Years: 25.00    Additional pack years: 0.00    Total pack years: 12.50    Types: Cigarettes    Quit date: 03/01/2019    Years since quitting: 3.6   Smokeless tobacco: Never  Vaping Use   Vaping Use: Never used  Substance and Sexual Activity   Alcohol use: Not Currently   Drug use: No   Sexual activity: Not on file  Other Topics Concern   Not on file  Social History Narrative   She resides at [facility] (assisted living facility).   Social Determinants of Health   Financial Resource Strain: Low Risk  (03/29/2019)   Overall Financial Resource Strain (CARDIA)    Difficulty of Paying Living Expenses: Not very hard  Food Insecurity: Not on file  Transportation Needs: No Transportation Needs (03/29/2019)   PRAPARE - Administrator, Civil Service (Medical): No    Lack of Transportation (Non-Medical): No  Physical Activity: Not on file  Stress: Not on file  Social Connections: Moderately Isolated (03/29/2019)   Social Connection and Isolation Panel [NHANES]    Frequency of Communication with Friends and Family: More than three times a week    Frequency of Social Gatherings with Friends and Family: Once a week    Attends Religious Services: 1 to 4 times per year    Active Member of [employer] or Organizations: No    Attends Banker Meetings: Never    Marital Status: Widowed  Intimate Partner Violence: Unknown (03/29/2019)   Humiliation, Afraid, Rape, and Kick questionnaire    Fear of Current or Ex-Partner: No    Emotionally Abused: Not  on file    Physically Abused: Not on file    Sexually Abused: Not on file   Family History  Problem Relation Age of Onset   Colon cancer Paternal Uncle       VITAL SIGNS BP 139/79   Pulse 95   Temp (!) 96.8 F (36 C)   Resp 18   Ht 5\' 2"  (1.575 m)   Wt 115 lb 12.8 oz (52.5 kg)   SpO2 97%   BMI 21.18 kg/m   Outpatient Encounter Medications as of 10/16/2022  Medication Sig  acetaminophen (TYLENOL) 650 MG CR tablet Take 650 mg by mouth 3 (three) times daily.   ALPRAZolam (XANAX) 0.25 MG tablet Take 1 tablet (0.25 mg total) by mouth daily. At 4 pm   BREZTRI AEROSPHERE 160-9-4.8 MCG/ACT AERO Inhale 2 puffs into the lungs 2 (two) times daily.   calcium-vitamin D (OSCAL WITH D) 500-5 MG-MCG tablet Take 1 tablet by mouth daily.   Camphor-Menthol-Methyl Sal (SALONPAS) 3.05-31-08 % [facility] Special Instructions: apply one pack to both sides of lower back daily and remove and at HS. for bilateral lower back pain. [DX: Chronic pain syndrome-per NP note]   carbidopa-levodopa (SINEMET IR) 25-100 MG tablet Take 1 tablet by mouth in the morning and at bedtime.   clopidogrel (PLAVIX) 75 MG tablet Take 75 mg by mouth daily.   docusate sodium (COLACE) 100 MG capsule Take 100 mg by mouth daily.   donepezil (ARICEPT) 5 MG tablet Take 5 mg by mouth at bedtime. 9 pm for parkinson's dementia   DULoxetine (CYMBALTA) 60 MG capsule Take 60 mg by mouth daily. 9 am   losartan (COZAAR) 100 MG tablet Take 100 mg by mouth daily.   meloxicam (MOBIC) 15 MG tablet Take 15 mg by mouth daily.   metoprolol tartrate (LOPRESSOR) 25 MG tablet Take 25 mg by mouth 2 (two) times daily.   NON FORMULARY Diet: : Dys 2 (chopped) diet, continue thin liquids; will allow pizza if requested  Liquids:Regular   Nutritional Supplements (ENSURE ENLIVE PO) Take by mouth 3 (three) times daily.   omeprazole (PRILOSEC) 20 MG capsule Take 20 mg by mouth in the morning and at bedtime.    polyethylene glycol (MIRALAX / GLYCOLAX) 17 g packet  Take 17 g by mouth daily.   pramipexole (MIRAPEX) 0.5 MG tablet Take 0.5 mg by mouth at bedtime.   QUEtiapine (SEROQUEL) 25 MG tablet Take 12.5 mg by mouth daily.   Spacer/Aero-Holding Chambers (AEROCHAMBER MAX W/FLOW-VU) MISC by Does not apply route.   traZODone (DESYREL) 50 MG tablet Take 25 mg by mouth at bedtime.   No facility-administered encounter medications on file as of 10/16/2022.     SIGNIFICANT DIAGNOSTIC EXAMS  LABS REVIEWED; PREVIOUS    11-07-21: chol 134; ldl 66; trig 173 hdl 33; hepatitis C: nr  01-30-22; wbc 6.0; hgb 14.5; hct 42.1; mcv 96.3 plt 190; glucose 102; bun 21; creat 0.76; k+ 4.0; na++ 140; ca 9.3 gfr >60; protein 6.5; albumin 3.6 hgb a1c 5.2; tsh 1.695 02-24-22: wbc 6.7; hgb 15.6; hct 46.3; mcv 98.7 pt 214; glucose 132; bun 36; creat 1.01; k+ 4.3; na++ 141; ca 9.8 gfr 58 d dimer 0.42; crp 1.1  08-28-22: wbc 4.9; hgb 13.7; hct 41.4; mcv 102.2; plt 188; glucose 87; bun 42; creat 0.96 ;k+ 3.6; na++ 139; ca 9.3 gfr >60 protein 6.6 albumin 3.8 tsh 0.696 09-13-22: tsh 0.805 free t3: 2.9 free t4: 1.03  NO NEW LABS.   Review of Systems  Reason unable to perform ROS: unable to fully participate.    Physical Exam Constitutional:      General: She is not in acute distress.    Appearance: She is well-developed. She is not diaphoretic.  Neck:     Thyroid: No thyromegaly.  Cardiovascular:     Rate and Rhythm: Normal rate and regular rhythm.     Pulses: Normal pulses.     Heart sounds: Normal heart sounds.  Pulmonary:     Effort: Pulmonary effort is normal. No respiratory distress.     Breath sounds:  Normal breath sounds.  Abdominal:     General: Bowel sounds are normal. There is no distension.     Palpations: Abdomen is soft.     Tenderness: There is no abdominal tenderness.  Musculoskeletal:     Cervical back: Neck supple.     Right lower leg: No edema.     Left lower leg: No edema.     Comments: Has left side weakness Leans head to the left  Bilateral hand  tremor  Has bilateral eyebrow movement Right arm shrug Has contractures  Lymphadenopathy:     Cervical: No cervical adenopathy.  Skin:    General: Skin is warm and dry.     Findings: Bruising present.  Neurological:     Mental Status: She is alert. Mental status is at baseline.  Psychiatric:     Comments: Cries easily       ASSESSMENT/ PLAN:  TODAY  Chronic pain syndrome Radiculopathy lumbar region  Will lower mobic to 7.5 mg daily and will monitor her status.    Synthia Innocent NP [facility]   call [phone]

## 2022-10-27 ENCOUNTER — Other Ambulatory Visit: Payer: Self-pay | Admitting: Adult Health

## 2022-10-27 MED ORDER — ALPRAZOLAM 0.25 MG PO TABS: 0.2500 mg | ORAL_TABLET | Freq: Every day | ORAL | 0 refills | Status: DC

## 2022-10-30 ENCOUNTER — Encounter: Payer: Self-pay | Admitting: Adult Health

## 2022-10-30 ENCOUNTER — Non-Acute Institutional Stay (SKILLED_NURSING_FACILITY): Payer: Medicare Other | Admitting: Adult Health

## 2022-10-30 DIAGNOSIS — G20B2 Parkinson's disease with dyskinesia, with fluctuations: Secondary | ICD-10-CM | POA: Diagnosis not present

## 2022-10-30 DIAGNOSIS — F02C2 Dementia in other diseases classified elsewhere, severe, with psychotic disturbance: Secondary | ICD-10-CM | POA: Diagnosis not present

## 2022-10-30 DIAGNOSIS — R634 Abnormal weight loss: Secondary | ICD-10-CM

## 2022-10-30 DIAGNOSIS — G20A1 Parkinson's disease without dyskinesia, without mention of fluctuations: Secondary | ICD-10-CM

## 2022-10-30 NOTE — Progress Notes (Signed)
Location:  [facility] Room Number: 108 Place of Service:  SNF (31)   CODE STATUS: dnr  Allergies  Allergen Reactions   Oxycodone Other (See Comments)    Can tolerate low dose mixed with tynlelol HALLUCINATIONS    Buspar [Buspirone] Other (See Comments)    Unknown   Docosahexaenoic Acid Other (See Comments)   Eicosapentaenoic Acid    Eicosapentaenoic Acid (Epa)    Lutein Nausea And Vomiting   Omega-3 Fatty Acids Other (See Comments)   Other    Aspirin Nausea And Vomiting and Other (See Comments)    Severe stomach pain    Codeine Nausea Only    Makes her feel very sick     Chief Complaint  Patient presents with   Acute Visit    Weight loss     HPI:  She is losing weight Jan 130.7 pounds in June 117.2 pounds. She is on ensure three times daily and is on aricept 5 mg daily. She does have parkinson and dementia both of which weight loss is a progression of disease processes. Her appetite remains fair.   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   PONV (postoperative nausea and vomiting)    states it has been a long time since getting sick   Sleep apnea    Has CPAP but doesnt use   Stroke (HCC)    TIA (transient ischemic attack)    not really TIA but abnormal MRI per pt left sided weakness   UTI (lower urinary tract infection)    Vitamin D deficiency     Past Surgical History:  Procedure Laterality Date   BACK SURGERY     Battery change     DBS, 12/2009   BIOPSY N/A 11/29/2013   Procedure: GASTRIC BIOPSY;  Surgeon: West Bali, MD;  Location: [facility];  Service: Endoscopy;  Laterality: N/A;   BURR HOLE W/ STEREOTACTIC INSERTION OF DBS LEADS /  INTRAOP MICROELECTRODE RECORDING     2007   CARPAL TUNNEL RELEASE Right    COLONOSCOPY WITH PROPOFOL N/A 11/29/2013   Procedure: COLONOSCOPY WITH PROPOFOL;  Surgeon: West Bali, MD;  Location: [facility];  Service: Endoscopy;  Laterality: N/A;  entered cecum @ [phone]; total cecal withdrawal time 21 minutes    ELBOW SURGERY Left    after fx   ESOPHAGEAL DILATION N/A 11/29/2013   Procedure: ESOPHAGEAL DILATION;  Surgeon: West Bali, MD;  Location: [facility];  Service: Endoscopy;  Laterality: N/A;  [facility] 14/15/16   ESOPHAGOGASTRODUODENOSCOPY (EGD) WITH PROPOFOL N/A 11/29/2013   Procedure: ESOPHAGOGASTRODUODENOSCOPY (EGD) WITH PROPOFOL;  Surgeon: West Bali, MD;  Location: [facility];  Service: Endoscopy;  Laterality: N/A;   FOOT SURGERY Left    "something with 2nd 2."   NECK SURGERY     Fusion   POLYPECTOMY N/A 11/29/2013   Procedure: POLYPECTOMY;  Surgeon: West Bali, MD;  Location: [facility];  Service: Endoscopy;  Laterality: N/A;  Distal Transverse Colon x2    PR ANALYZE NEUROSTIM BRAIN, FIRST 1H  10/15/2012       PULSE GENERATOR IMPLANT Right 03/30/2018  Procedure: Right Implantable pulse generator change;  Surgeon: Maeola Harman, MD;  Location: [facility] OR;  Service: Neurosurgery;  Laterality: Right;   SUBTHALAMIC STIMULATOR BATTERY REPLACEMENT N/A 08/05/2013   Procedure: SUBTHALAMIC STIMULATOR BATTERY REPLACEMENT;  Surgeon: Maeola Harman, MD;  Location: [facility];  Service: Neurosurgery;  Laterality: N/A;   SUBTHALAMIC STIMULATOR BATTERY REPLACEMENT N/A 11/26/2015   Procedure: Implantable pulse generator battery change for deep brain stimulator;  Surgeon: Maeola Harman, MD;  Location: [facility];  Service: Neurosurgery;  Laterality: N/A;   SUBTHALAMIC STIMULATOR BATTERY REPLACEMENT Right 09/13/2020   Procedure: Right chest implantable pulse generator change for depleted battery;  Surgeon: Maeola Harman, MD;  [facility] OR;  Service: Neurosurgery;  Laterality: Right;  3C/RM 18   VAGINAL HYSTERECTOMY      "partial"   WRIST SURGERY Right    after fx    Social History   Socioeconomic History   Marital status: Widowed    Spouse name: Not on file   Number of children: Not on file   Years of education: Not on file   Highest education level: Not on file  Occupational History   Occupation: Retired  Tobacco Use   Smoking status: Former    Packs/day: 0.50    Years: 25.00    Additional pack years: 0.00    Total pack years: 12.50    Types: Cigarettes    Quit date: 03/01/2019    Years since quitting: 3.6   Smokeless tobacco: Never  Vaping Use   Vaping Use: Never used  Substance and Sexual Activity   Alcohol use: Not Currently   Drug use: No   Sexual activity: Not on file  Other Topics Concern   Not on file  Social History Narrative   She resides at [facility] (assisted living facility).   Social Determinants of Health   Financial Resource Strain: Low Risk  (03/29/2019)   Overall Financial Resource Strain (CARDIA)    Difficulty of Paying Living Expenses: Not very hard  Food Insecurity: Not on file  Transportation Needs: No Transportation Needs (03/29/2019)   PRAPARE - Administrator, Civil Service (Medical): No    Lack of Transportation (Non-Medical): No  Physical Activity: Not on file  Stress: Not on file  Social Connections: Moderately Isolated (03/29/2019)   Social Connection and Isolation Panel [NHANES]    Frequency of Communication with Friends and Family: More than three times a week    Frequency of Social Gatherings with Friends and Family: Once a week    Attends Religious Services: 1 to 4 times per year    Active Member of [employer] or Organizations: No    Attends Banker Meetings: Never    Marital Status: Widowed  Intimate Partner Violence: Unknown (03/29/2019)   Humiliation, Afraid, Rape, and Kick questionnaire    Fear of Current or Ex-Partner: No    Emotionally Abused: Not on file    Physically Abused: Not on file    Sexually Abused: Not  on file   Family History  Problem Relation Age of Onset   Colon cancer Paternal Uncle       VITAL SIGNS BP 104/63   Pulse 74   Temp 97.6 F (36.4 C)   Resp 12   Ht 5\' 2"  (1.575 m)   Wt 117 lb 3.2 oz (53.2 kg)   SpO2 95%   BMI 21.44 kg/m   Outpatient Encounter Medications as of 10/30/2022  Medication Sig   acetaminophen (TYLENOL) 650 MG CR  tablet Take 650 mg by mouth 3 (three) times daily.   ALPRAZolam (XANAX) 0.25 MG tablet Take 1 tablet (0.25 mg total) by mouth daily. At 4 pm   BREZTRI AEROSPHERE 160-9-4.8 MCG/ACT AERO Inhale 2 puffs into the lungs 2 (two) times daily.   calcium-vitamin D (OSCAL WITH D) 500-5 MG-MCG tablet Take 1 tablet by mouth daily.   Camphor-Menthol-Methyl Sal (SALONPAS) 3.05-31-08 % [facility] Special Instructions: apply one pack to both sides of lower back daily and remove and at HS. for bilateral lower back pain. [DX: Chronic pain syndrome-per NP note]   carbidopa-levodopa (SINEMET IR) 25-100 MG tablet Take 1 tablet by mouth in the morning and at bedtime.   clopidogrel (PLAVIX) 75 MG tablet Take 75 mg by mouth daily.   docusate sodium (COLACE) 100 MG capsule Take 100 mg by mouth daily.   donepezil (ARICEPT) 5 MG tablet Take 5 mg by mouth at bedtime. 9 pm for parkinson's dementia   DULoxetine (CYMBALTA) 60 MG capsule Take 60 mg by mouth daily. 9 am   losartan (COZAAR) 100 MG tablet Take 100 mg by mouth daily.   meloxicam (MOBIC) 15 MG tablet Take 15 mg by mouth daily.   metoprolol tartrate (LOPRESSOR) 25 MG tablet Take 25 mg by mouth 2 (two) times daily.   NON FORMULARY Diet: : Dys 2 (chopped) diet, continue thin liquids; will allow pizza if requested  Liquids:Regular   Nutritional Supplements (ENSURE ENLIVE PO) Take by mouth 3 (three) times daily.   omeprazole (PRILOSEC) 20 MG capsule Take 20 mg by mouth in the morning and at bedtime.    polyethylene glycol (MIRALAX / GLYCOLAX) 17 g packet Take 17 g by mouth daily.   pramipexole (MIRAPEX) 0.5 MG tablet Take  0.5 mg by mouth at bedtime.   QUEtiapine (SEROQUEL) 25 MG tablet Take 12.5 mg by mouth daily.   Spacer/Aero-Holding Chambers (AEROCHAMBER MAX W/FLOW-VU) MISC by Does not apply route.   traZODone (DESYREL) 50 MG tablet Take 25 mg by mouth at bedtime.   No facility-administered encounter medications on file as of 10/30/2022.     SIGNIFICANT DIAGNOSTIC EXAMS  LABS REVIEWED; PREVIOUS    11-07-21: chol 134; ldl 66; trig 173 hdl 33; hepatitis C: nr  01-30-22; wbc 6.0; hgb 14.5; hct 42.1; mcv 96.3 plt 190; glucose 102; bun 21; creat 0.76; k+ 4.0; na++ 140; ca 9.3 gfr >60; protein 6.5; albumin 3.6 hgb a1c 5.2; tsh 1.695 02-24-22: wbc 6.7; hgb 15.6; hct 46.3; mcv 98.7 pt 214; glucose 132; bun 36; creat 1.01; k+ 4.3; na++ 141; ca 9.8 gfr 58 d dimer 0.42; crp 1.1  08-28-22: wbc 4.9; hgb 13.7; hct 41.4; mcv 102.2; plt 188; glucose 87; bun 42; creat 0.96 ;k+ 3.6; na++ 139; ca 9.3 gfr >60 protein 6.6 albumin 3.8 tsh 0.696 09-13-22: tsh 0.805 free t3: 2.9 free t4: 1.03  NO NEW LABS.   Review of Systems  Reason unable to perform ROS: unable to fully participate.    Physical Exam Constitutional:      General: She is not in acute distress.    Appearance: She is underweight. She is not diaphoretic.  Neck:     Thyroid: No thyromegaly.  Cardiovascular:     Rate and Rhythm: Normal rate and regular rhythm.     Pulses: Normal pulses.     Heart sounds: Normal heart sounds.  Pulmonary:     Effort: Pulmonary effort is normal. No respiratory distress.     Breath sounds: Normal breath sounds.  Abdominal:  General: Bowel sounds are normal. There is no distension.     Palpations: Abdomen is soft.     Tenderness: There is no abdominal tenderness.  Musculoskeletal:     Cervical back: Neck supple.     Right lower leg: No edema.     Left lower leg: No edema.     Comments: Has left side weakness Leans head to the left  Bilateral hand tremor  Has bilateral eyebrow movement Right arm shrug Has contractures    Lymphadenopathy:     Cervical: No cervical adenopathy.  Skin:    General: Skin is warm and dry.  Neurological:     Mental Status: She is alert. Mental status is at baseline.  Psychiatric:        Mood and Affect: Mood normal.     ASSESSMENT/ PLAN:  TODAY  Weight loss Parkinson's disease Parkinson's dementia  Will stop aricept and calcium Will continue supplements as directed    Synthia Innocent NP [facility]   call [phone]

## 2022-11-12 ENCOUNTER — Non-Acute Institutional Stay (SKILLED_NURSING_FACILITY): Payer: Medicare Other | Admitting: Internal Medicine

## 2022-11-12 ENCOUNTER — Encounter: Payer: Self-pay | Admitting: Internal Medicine

## 2022-11-12 DIAGNOSIS — D7589 Other specified diseases of blood and blood-forming organs: Secondary | ICD-10-CM | POA: Diagnosis not present

## 2022-11-12 DIAGNOSIS — I10 Essential (primary) hypertension: Secondary | ICD-10-CM | POA: Diagnosis not present

## 2022-11-12 DIAGNOSIS — G20B2 Parkinson's disease with dyskinesia, with fluctuations: Secondary | ICD-10-CM

## 2022-11-12 DIAGNOSIS — R627 Adult failure to thrive: Secondary | ICD-10-CM | POA: Diagnosis not present

## 2022-11-12 NOTE — Assessment & Plan Note (Signed)
BP controlled; no change in antihypertensive medications  

## 2022-11-12 NOTE — Assessment & Plan Note (Signed)
The stigmata of Parkinson's are stable; the most striking finding is the constant tremor of the left upper extremity and the masklike facies.  Today she is not exhibiting the emotional swings with bouts of crying which is usually present.

## 2022-11-12 NOTE — Patient Instructions (Signed)
See assessment and plan under each diagnosis in the problem list and acutely for this visit 

## 2022-11-12 NOTE — Assessment & Plan Note (Signed)
Macrocytosis is a new finding but there is no anemia.  B12 level was last checked in 2012.  It will be updated if she develops any anemia.

## 2022-11-12 NOTE — Progress Notes (Unsigned)
   NURSING HOME LOCATION:  [facility] ROOM NUMBER:  108W  CODE STATUS:  DNR  PCP:  Synthia Innocent NP  This is a nursing facility follow up visit of chronic medical diagnoses & to document compliance with Regulation 483.30 (c) in The Long Term Care Survey Manual Phase 2 which mandates caregiver visit ( visits can alternate among physician, PA or NP as per statutes) within 10 days of 30 days / 60 days/ 90 days post admission to SNF date    Interim medical record and care since last SNF visit was updated with review of diagnostic studies and change in clinical status since last visit were documented.  HPI: She is a permanent resident of this facility with medical diagnoses of essential hypertension, Parkinson's complicated by neurocognitive deficits, anxiety/depression, history of stroke, dyslipidemia.  Significantly she has had a deep brain stimulator implant because of the Parkinson's. Most recent labs were completed in April.  Prerenal azotemia was present with a BUN of 42.  CKD stage C was present.  MCV was 102.2.  Other labs were normal.  Complete thyroid function studies were normal; TSH was low normal at 0.805, up from prior values of 0.696.  Review of systems could not be completed as she is essentially nonverbal admitting only hyponasal, unintelligible sounds.  Physical exam:  Pertinent or positive findings: She appears her age and chronically ill.  As noted she exhibits a hyponasal, unintelligible speech pattern.  She has 3 upper teeth and 1 lower teeth remaining.  She is sure facies have somewhat of a grimacing countenance.  The mouth is asymmetric.  NS 4 is present.  Breath sounds are decreased.  There is a deep brain stimulator subcutaneously over the right chest with the catheter palpable in the right lateral neck.  Pedal pulses are not palpable.  She has a below the knee brace on the right lower extremity.  The left upper extremity is stronger than the right upper  extremity.  There is minimal strength opposition of the lower extremities.  Interosseous wasting is noted.  She exhibits an almost constant circular tremor of the left upper extremity.  General appearance: Adequately nourished; no acute distress, increased work of breathing is present.   Lymphatic: No lymphadenopathy about the head, neck, axilla. Eyes: No conjunctival inflammation or lid edema is present. There is no scleral icterus. Ears:  External ear exam shows no significant lesions or deformities.   Nose:  External nasal examination shows no deformity or inflammation. Nasal mucosa are pink and moist without lesions, exudates Oral exam:  Lips and gums are healthy appearing. There is no oropharyngeal erythema or exudate. Neck:  No thyromegaly, masses, tenderness noted.    Heart:  Normal rate and regular rhythm. S1 and S2 normal without gallop, murmur, click, rub .  Lungs: Chest clear to auscultation without wheezes, rhonchi, rales, rubs. Abdomen: Bowel sounds are normal. Abdomen is soft and nontender with no organomegaly, hernias, masses. GU: Deferred  Extremities:  No cyanosis, clubbing, edema  Neurologic exam : Cn 2-7 intact Strength equal  in upper & lower extremities Balance, Rhomberg, finger to nose testing could not be completed due to clinical state Deep tendon reflexes are equal Skin: Warm & dry w/o tenting. No significant lesions or rash.  See summary under each active problem in the Problem List with associated updated therapeutic plan

## 2022-11-13 NOTE — Assessment & Plan Note (Signed)
There has ben essentially no change in her multifactorial adult failure to thrive. Subclinical hyperthyroidism has been  ruled out. B12 & folate will be checked were anemia to be documented.

## 2022-11-25 ENCOUNTER — Other Ambulatory Visit: Payer: Self-pay | Admitting: Adult Health

## 2022-11-25 MED ORDER — ALPRAZOLAM 0.25 MG PO TABS: 0.2500 mg | ORAL_TABLET | Freq: Every day | ORAL | 0 refills | Status: DC

## 2022-12-02 ENCOUNTER — Non-Acute Institutional Stay (SKILLED_NURSING_FACILITY): Payer: Medicare Other | Admitting: Adult Health

## 2022-12-02 ENCOUNTER — Encounter: Payer: Self-pay | Admitting: Adult Health

## 2022-12-02 DIAGNOSIS — G20A1 Parkinson's disease without dyskinesia, without mention of fluctuations: Secondary | ICD-10-CM

## 2022-12-02 DIAGNOSIS — G894 Chronic pain syndrome: Secondary | ICD-10-CM | POA: Diagnosis not present

## 2022-12-02 DIAGNOSIS — I10 Essential (primary) hypertension: Secondary | ICD-10-CM | POA: Diagnosis not present

## 2022-12-02 DIAGNOSIS — R627 Adult failure to thrive: Secondary | ICD-10-CM

## 2022-12-02 DIAGNOSIS — F02C2 Dementia in other diseases classified elsewhere, severe, with psychotic disturbance: Secondary | ICD-10-CM

## 2022-12-02 NOTE — Progress Notes (Unsigned)
Location:  [facility] Room Number: 108-W Place of Service:  SNF (31) Provider: Synthia Innocent, NP  CODE STATUS: DNR  Allergies  Allergen Reactions   Oxycodone Other (See Comments)    Can tolerate low dose mixed with tynlelol HALLUCINATIONS    Buspar [Buspirone] Other (See Comments)    Unknown   Docosahexaenoic Acid Other (See Comments)   Eicosapentaenoic Acid    Eicosapentaenoic Acid (Epa)    Lutein Nausea And Vomiting   Omega-3 Fatty Acids Other (See Comments)   Other    Aspirin Nausea And Vomiting and Other (See Comments)    Severe stomach pain    Codeine Nausea Only    Makes her feel very sick     Chief Complaint  Patient presents with   Medical Management of Chronic Issues               Chronic pain syndrome:  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 She is slowly declining; is losing weight. She is off aricept. There are no reports of uncontrolled pain.   Past Medical History:  Diagnosis Date   Anxiety    Arthritis    Complication of anesthesia    Depression    Depression with suicidal ideation 01/21/2021   Dysphasia    GERD (gastroesophageal reflux disease)    Hyperlipidemia    Hypertension    Parkinson's disease    diagnosed at age 18   Parkinson's disease dementia (HCC) 07/31/2020   PONV (postoperative nausea and vomiting)    states it has been a long time since getting sick   Sleep apnea    Has CPAP but doesnt use   Stroke (HCC)    TIA (transient ischemic attack)    not really TIA but abnormal MRI per pt left sided weakness   UTI (lower urinary tract infection)    Vitamin D deficiency     Past Surgical History:  Procedure Laterality Date   BACK SURGERY      Battery change     DBS, 12/2009   BIOPSY N/A 11/29/2013   Procedure: GASTRIC BIOPSY;  Surgeon: West Bali, MD;  Location: [facility];  Service: Endoscopy;  Laterality: N/A;   BURR HOLE W/ STEREOTACTIC INSERTION OF DBS LEADS / INTRAOP MICROELECTRODE RECORDING     2007   CARPAL TUNNEL RELEASE Right    COLONOSCOPY WITH PROPOFOL N/A 11/29/2013   Procedure: COLONOSCOPY WITH PROPOFOL;  Surgeon: West Bali, MD;  Location: [facility];  Service: Endoscopy;  Laterality: N/A;  entered cecum @ [phone]; total cecal withdrawal time 21 minutes    ELBOW SURGERY Left    after fx   ESOPHAGEAL DILATION N/A 11/29/2013   Procedure: ESOPHAGEAL DILATION;  Surgeon: West Bali, MD;  Location: [facility];  Service: Endoscopy;  Laterality: N/A;  [place] 14/15/16   ESOPHAGOGASTRODUODENOSCOPY (EGD) WITH PROPOFOL N/A 11/29/2013   Procedure: ESOPHAGOGASTRODUODENOSCOPY (EGD) WITH PROPOFOL;  Surgeon: West Bali, MD;  Location: [facility];  Service: Endoscopy;  Laterality: N/A;   FOOT SURGERY Left    "something with 2nd 2."   NECK SURGERY     Fusion   POLYPECTOMY N/A 11/29/2013   Procedure: POLYPECTOMY;  Surgeon: West Bali, MD;  Location: [facility];  Service: Endoscopy;  Laterality: N/A;  Distal Transverse Colon x2    PR ANALYZE NEUROSTIM BRAIN, FIRST 1H  10/15/2012       PULSE GENERATOR IMPLANT Right 03/30/2018   Procedure: Right Implantable pulse generator change;  Surgeon: Maeola Harman, MD;  Location: [facility] OR;  Service: Neurosurgery;  Laterality: Right;   SUBTHALAMIC STIMULATOR BATTERY REPLACEMENT N/A 08/05/2013   Procedure: SUBTHALAMIC STIMULATOR BATTERY REPLACEMENT;  Surgeon: Maeola Harman, MD;  Location: [facility];  Service: Neurosurgery;  Laterality: N/A;   SUBTHALAMIC STIMULATOR BATTERY REPLACEMENT N/A 11/26/2015   Procedure: Implantable pulse generator battery change for deep brain stimulator;  Surgeon: Maeola Harman, MD;  Location: [facility];  Service: Neurosurgery;  Laterality: N/A;   SUBTHALAMIC STIMULATOR BATTERY  REPLACEMENT Right 09/13/2020   Procedure: Right chest implantable pulse generator change for depleted battery;  Surgeon: Maeola Harman, MD;  Location: [facility] OR;  Service: Neurosurgery;  Laterality: Right;  3C/RM 18   VAGINAL HYSTERECTOMY     "partial"   WRIST SURGERY Right    after fx    Social History   Socioeconomic History   Marital status: Widowed    Spouse name: Not on file   Number of children: Not on file   Years of education: Not on file   Highest education level: Not on file  Occupational History   Occupation: Retired  Tobacco Use   Smoking status: Former    Packs/day: 0.50    Years: 25.00    Additional pack years: 0.00    Total pack years: 12.50    Types: Cigarettes    Quit date: 03/01/2019    Years since quitting: 3.7   Smokeless tobacco: Never  Vaping Use   Vaping Use: Never used  Substance and Sexual Activity   Alcohol use: Not Currently   Drug use: No   Sexual activity: Not on file  Other Topics Concern   Not on file  Social History Narrative   She resides at [facility] (assisted living facility).   Social Determinants of Health   Financial Resource Strain: Low Risk  (03/29/2019)   Overall Financial Resource Strain (CARDIA)    Difficulty of Paying Living Expenses: Not very hard  Food Insecurity: Not on file  Transportation Needs: No Transportation Needs (03/29/2019)   PRAPARE - Administrator, Civil Service (Medical): No    Lack of Transportation (Non-Medical): No  Physical Activity: Not on file  Stress: Not on file  Social Connections: Moderately Isolated (03/29/2019)   Social Connection and Isolation Panel [NHANES]    Frequency of Communication with Friends and Family: More than three times a week    Frequency of Social Gatherings with Friends and Family: Once a week    Attends Religious Services: 1 to 4 times per year    Active Member of [employer] or Organizations: No    Attends Banker Meetings: Never    Marital Status: Widowed   Intimate Partner Violence: Unknown (03/29/2019)   Humiliation, Afraid, Rape, and Kick questionnaire    Fear of Current or Ex-Partner: No    Emotionally Abused: Not on file    Physically Abused: Not on file    Sexually Abused: Not on file   Family History  Problem Relation Age of Onset   Colon cancer Paternal Uncle       VITAL SIGNS BP 136/64   Pulse 64   Temp (!) 97.1 F (36.2 C)   Resp 20   Ht 5\' 2"  (1.575 m)  Wt 101 lb 9.6 oz (46.1 kg)   SpO2 98%   BMI 18.58 kg/m   Outpatient Encounter Medications as of 12/02/2022  Medication Sig   acetaminophen (TYLENOL) 650 MG CR tablet Take 650 mg by mouth 3 (three) times daily.   ALPRAZolam (XANAX) 0.25 MG tablet Take 1 tablet (0.25 mg total) by mouth daily. At 4 pm   BREZTRI AEROSPHERE 160-9-4.8 MCG/ACT AERO Inhale 2 puffs into the lungs 2 (two) times daily.   Camphor-Menthol-Methyl Sal (SALONPAS) 3.05-31-08 % [facility] Special Instructions: apply one pack to both sides of lower back daily and remove and at HS. for bilateral lower back pain. [DX: Chronic pain syndrome-per NP note]   carbidopa-levodopa (SINEMET IR) 25-100 MG tablet Take 1 tablet by mouth in the morning and at bedtime.   clopidogrel (PLAVIX) 75 MG tablet Take 75 mg by mouth daily.   DULoxetine (CYMBALTA) 60 MG capsule Take 60 mg by mouth daily. 9 am   losartan (COZAAR) 100 MG tablet Take 100 mg by mouth daily.   meloxicam (MOBIC) 7.5 MG tablet Take 7.5 mg by mouth daily.   metoprolol tartrate (LOPRESSOR) 25 MG tablet Take 25 mg by mouth 2 (two) times daily.   Nutritional Supplements (ENSURE ENLIVE PO) Take by mouth 3 (three) times daily.   omeprazole (PRILOSEC) 20 MG capsule Take 20 mg by mouth in the morning and at bedtime.    polyethylene glycol (MIRALAX / GLYCOLAX) 17 g packet Take 17 g by mouth daily.   pramipexole (MIRAPEX) 0.5 MG tablet Take 0.5 mg by mouth at bedtime.   QUEtiapine (SEROQUEL) 25 MG tablet Take 12.5 mg by mouth daily.   sennosides-docusate sodium  (SENOKOT-S) 8.6-50 MG tablet Take 1 tablet by mouth daily.   Spacer/Aero-Holding Chambers (AEROCHAMBER MAX W/FLOW-VU) MISC by Does not apply route.   traZODone (DESYREL) 50 MG tablet Take 25 mg by mouth at bedtime.   [DISCONTINUED] docusate sodium (COLACE) 100 MG capsule Take 100 mg by mouth daily.   [DISCONTINUED] meloxicam (MOBIC) 15 MG tablet Take 15 mg by mouth daily.   [DISCONTINUED] NON FORMULARY Diet: : Dys 2 (chopped) diet, continue thin liquids; will allow pizza if requested  Liquids:Regular   No facility-administered encounter medications on file as of 12/02/2022.     SIGNIFICANT DIAGNOSTIC EXAMS   LABS REVIEWED; PREVIOUS    01-30-22; wbc 6.0; hgb 14.5; hct 42.1; mcv 96.3 plt 190; glucose 102; bun 21; creat 0.76; k+ 4.0; na++ 140; ca 9.3 gfr >60; protein 6.5; albumin 3.6 hgb a1c 5.2; tsh 1.695 02-24-22: wbc 6.7; hgb 15.6; hct 46.3; mcv 98.7 pt 214; glucose 132; bun 36; creat 1.01; k+ 4.3; na++ 141; ca 9.8 gfr 58 d dimer 0.42; crp 1.1  08-28-22: wbc 4.9; hgb 13.7; hct 41.4; mcv 102.2; plt 188; glucose 87; bun 42; creat 0.96 ;k+ 3.6; na++ 139; ca 9.3 gfr >60 protein 6.6 albumin 3.8 tsh 0.696 09-13-22: tsh 0.805 free t3: 2.9 free t4: 1.03  NO NEW LABS.   Review of Systems  Reason unable to perform ROS: unable to fully participate.   Physical Exam Constitutional:      General: She is not in acute distress.    Appearance: She is well-developed. She is not diaphoretic.  Neck:     Thyroid: No thyromegaly.  Cardiovascular:     Rate and Rhythm: Normal rate and regular rhythm.     Pulses: Normal pulses.     Heart sounds: Normal heart sounds.  Pulmonary:     Effort: Pulmonary effort  is normal. No respiratory distress.     Breath sounds: Normal breath sounds.  Abdominal:     General: Bowel sounds are normal. There is no distension.     Palpations: Abdomen is soft.     Tenderness: There is no abdominal tenderness.  Musculoskeletal:     Cervical back: Neck supple.     Right lower  leg: No edema.     Left lower leg: No edema.     Comments:  Has left side weakness Leans head to the left  Bilateral hand tremor  Has bilateral eyebrow movement Right arm shrug Has contractures    Lymphadenopathy:     Cervical: No cervical adenopathy.  Skin:    General: Skin is warm and dry.  Neurological:     Mental Status: She is alert. Mental status is at baseline.  Psychiatric:        Mood and Affect: Mood normal.     .    ASSESSMENT/ PLAN:  TODAY  Chronic pain syndrome: will continue cymbalta 60 mg daily (also takes for mood state) salon patches to back; tylenol 650 mg three times daily   2. 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   3. Essential hypertension: b/p 136/64 will continue cozaar 100 mg daily and lopressor 25 mg twice daily   PREVIOUS     4. Parkinson's disease is stable is status post deep brain stimulator will continue sinemet IR 25/100 mg twice daily and mirapex 0.5 mg nightly   5. OAB (over active bladder) is off ditropan is completely incontinent of bladder.   6. Mixed hyperlipidemia: ldl 66 will continue to monitor   7. PBA: will continue neudexta 10/20 mg twice daily   8. 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 COPD without exacerbation: will continue breztri aerosphere 160.4-4.8 mcg 2 puffs twice daily   10. GERD without esophagitis: will continue prilosec 20 mg twice daily   11. Chronic constipation: will continue colace daily; miralax daily     Synthia Innocent NP [facility]  call [phone]

## 2022-12-25 ENCOUNTER — Other Ambulatory Visit: Payer: Self-pay | Admitting: Adult Health

## 2022-12-25 MED ORDER — ALPRAZOLAM 0.25 MG PO TABS: 0.2500 mg | ORAL_TABLET | Freq: Every day | ORAL | 0 refills | Status: DC

## 2023-01-09 ENCOUNTER — Encounter: Payer: Self-pay | Admitting: Adult Health

## 2023-01-09 ENCOUNTER — Non-Acute Institutional Stay (SKILLED_NURSING_FACILITY): Payer: Medicare Other | Admitting: Adult Health

## 2023-01-09 DIAGNOSIS — G20B2 Parkinson's disease with dyskinesia, with fluctuations: Secondary | ICD-10-CM

## 2023-01-09 DIAGNOSIS — J449 Chronic obstructive pulmonary disease, unspecified: Secondary | ICD-10-CM

## 2023-01-09 DIAGNOSIS — F22 Delusional disorders: Secondary | ICD-10-CM | POA: Diagnosis not present

## 2023-01-09 NOTE — Progress Notes (Signed)
Location:  [facility] Room Number: 108 Place of Service:  SNF (31)   CODE STATUS: dnr   Allergies  Allergen Reactions   Oxycodone Other (See Comments)    Can tolerate low dose mixed with tynlelol HALLUCINATIONS    Buspar [Buspirone] Other (See Comments)    Unknown   Docosahexaenoic Acid Other (See Comments)   Eicosapentaenoic Acid    Eicosapentaenoic Acid (Epa)    Lutein Nausea And Vomiting   Omega-3 Fatty Acids Other (See Comments)   Other    Aspirin Nausea And Vomiting and Other (See Comments)    Severe stomach pain    Codeine Nausea Only    Makes her feel very sick     Chief Complaint  Patient presents with   Acute Visit    Care plan meeting     HPI:  We have come together for her care plan meeting. BIMS 10/15 mood 9/30: decreased energy, trouble concentrating. She is nonambulatory with no falls. She requires maximum to dependent for her adl care. She is incontinent of bladder and bowel. Dietary: she requries supervision for her meals; she is on D3 with thin liquids she has declined assist with meals; has adaptive equipment. Weight is 103 pounds; she continues to lose weight. Therapy: on ST maintenance functional. Activities: 1:1. She continues to be followed for her chronic illnesses including:   Parkinson's disease with dyskinesia and fluctuating manifestations     Delusional thoughts  COPD without exacerbation   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   PONV (postoperative nausea and vomiting)    states it has been a long time since getting sick   Sleep apnea    Has CPAP but doesnt use   Stroke (HCC)    TIA (transient ischemic attack)    not  really TIA but abnormal MRI per pt left sided weakness   UTI (lower urinary tract infection)    Vitamin D deficiency     Past Surgical History:  Procedure Laterality Date   BACK SURGERY     Battery change     DBS, 12/2009   BIOPSY N/A 11/29/2013   Procedure: GASTRIC BIOPSY;  Surgeon: West Bali, MD;  Location: [facility];  Service: Endoscopy;  Laterality: N/A;   BURR HOLE W/ STEREOTACTIC INSERTION OF DBS LEADS / INTRAOP MICROELECTRODE RECORDING     2007   CARPAL TUNNEL RELEASE Right    COLONOSCOPY WITH PROPOFOL N/A 11/29/2013   Procedure: COLONOSCOPY WITH PROPOFOL;  Surgeon: West Bali, MD;  Location: [facility];  Service: Endoscopy;  Laterality: N/A;  entered cecum @ [phone]; total cecal withdrawal time 21 minutes    ELBOW SURGERY Left    after fx   ESOPHAGEAL DILATION N/A 11/29/2013   Procedure: ESOPHAGEAL DILATION;  Surgeon: West Bali, MD;  Location: [facility];  Service: Endoscopy;  Laterality: N/A;  [facility] 14/15/16   ESOPHAGOGASTRODUODENOSCOPY (EGD) WITH PROPOFOL N/A 11/29/2013   Procedure: ESOPHAGOGASTRODUODENOSCOPY (EGD) WITH PROPOFOL;  Surgeon: West Bali, MD;  Location: [facility];  Service: Endoscopy;  Laterality: N/A;   FOOT SURGERY Left    "something with 2nd 2."  NECK SURGERY     Fusion   POLYPECTOMY N/A 11/29/2013   Procedure: POLYPECTOMY;  Surgeon: West Bali, MD;  Location: [facility];  Service: Endoscopy;  Laterality: N/A;  Distal Transverse Colon x2    PR ANALYZE NEUROSTIM BRAIN, FIRST 1H  10/15/2012       PULSE GENERATOR IMPLANT Right 03/30/2018   Procedure: Right Implantable pulse generator change;  Surgeon: Maeola Harman, MD;  Location: [facility] OR;  Service: Neurosurgery;  Laterality: Right;   SUBTHALAMIC STIMULATOR BATTERY REPLACEMENT N/A 08/05/2013   Procedure: SUBTHALAMIC STIMULATOR BATTERY REPLACEMENT;  Surgeon: Maeola Harman, MD;  Location: [facility];  Service: Neurosurgery;  Laterality: N/A;   SUBTHALAMIC STIMULATOR BATTERY REPLACEMENT N/A 11/26/2015   Procedure:  Implantable pulse generator battery change for deep brain stimulator;  Surgeon: Maeola Harman, MD;  Location: [facility];  Service: Neurosurgery;  Laterality: N/A;   SUBTHALAMIC STIMULATOR BATTERY REPLACEMENT Right 09/13/2020   Procedure: Right chest implantable pulse generator change for depleted battery;  Surgeon: Maeola Harman, MD;  Location: [facility] OR;  Service: Neurosurgery;  Laterality: Right;  3C/RM 18   VAGINAL HYSTERECTOMY     "partial"   WRIST SURGERY Right    after fx    Social History   Socioeconomic History   Marital status: Widowed    Spouse name: Not on file   Number of children: Not on file   Years of education: Not on file   Highest education level: Not on file  Occupational History   Occupation: Retired  Tobacco Use   Smoking status: Former    Current packs/day: 0.00    Average packs/day: 0.5 packs/day for 25.0 years (12.5 ttl pk-yrs)    Types: Cigarettes    Start date: 02/28/1994    Quit date: 03/01/2019    Years since quitting: 3.8   Smokeless tobacco: Never  Vaping Use   Vaping status: Never Used  Substance and Sexual Activity   Alcohol use: Not Currently   Drug use: No   Sexual activity: Not on file  Other Topics Concern   Not on file  Social History Narrative   She resides at [facility] (assisted living facility).   Social Determinants of Health   Financial Resource Strain: Low Risk  (03/29/2019)   Overall Financial Resource Strain (CARDIA)    Difficulty of Paying Living Expenses: Not very hard  Food Insecurity: Not on file  Transportation Needs: No Transportation Needs (03/29/2019)   PRAPARE - Administrator, Civil Service (Medical): No    Lack of Transportation (Non-Medical): No  Physical Activity: Not on file  Stress: Not on file  Social Connections: Moderately Isolated (03/29/2019)   Social Connection and Isolation Panel [NHANES]    Frequency of Communication with Friends and Family: More than three times a week    Frequency of Social  Gatherings with Friends and Family: Once a week    Attends Religious Services: 1 to 4 times per year    Active Member of [employer] or Organizations: No    Attends Banker Meetings: Never    Marital Status: Widowed  Intimate Partner Violence: Unknown (03/29/2019)   Humiliation, Afraid, Rape, and Kick questionnaire    Fear of Current or Ex-Partner: No    Emotionally Abused: Not on file    Physically Abused: Not on file    Sexually Abused: Not on file   Family History  Problem Relation Age of Onset   Colon cancer Paternal Uncle  VITAL SIGNS BP (!) 118/54   Pulse 82   Temp 97.9 F (36.6 C)   Resp 20   Ht 5\' 2"  (1.575 m)   Wt 103 lb (46.7 kg)   SpO2 93%   BMI 18.84 kg/m   Outpatient Encounter Medications as of 01/09/2023  Medication Sig   acetaminophen (TYLENOL) 650 MG CR tablet Take 650 mg by mouth 3 (three) times daily.   ALPRAZolam (XANAX) 0.25 MG tablet Take 1 tablet (0.25 mg total) by mouth daily. At 4 pm   BREZTRI AEROSPHERE 160-9-4.8 MCG/ACT AERO Inhale 2 puffs into the lungs 2 (two) times daily.   Camphor-Menthol-Methyl Sal (SALONPAS) 3.05-31-08 % [facility] Special Instructions: apply one pack to both sides of lower back daily and remove and at HS. for bilateral lower back pain. [DX: Chronic pain syndrome-per NP note]   carbidopa-levodopa (SINEMET IR) 25-100 MG tablet Take 1 tablet by mouth in the morning and at bedtime.   clopidogrel (PLAVIX) 75 MG tablet Take 75 mg by mouth daily.   DULoxetine (CYMBALTA) 60 MG capsule Take 60 mg by mouth daily. 9 am   losartan (COZAAR) 100 MG tablet Take 100 mg by mouth daily.   meloxicam (MOBIC) 7.5 MG tablet Take 7.5 mg by mouth daily.   metoprolol tartrate (LOPRESSOR) 25 MG tablet Take 25 mg by mouth 2 (two) times daily.   Nutritional Supplements (ENSURE ENLIVE PO) Take by mouth 3 (three) times daily.   omeprazole (PRILOSEC) 20 MG capsule Take 20 mg by mouth in the morning and at bedtime.    polyethylene glycol (MIRALAX /  GLYCOLAX) 17 g packet Take 17 g by mouth daily.   pramipexole (MIRAPEX) 0.5 MG tablet Take 0.5 mg by mouth at bedtime.   QUEtiapine (SEROQUEL) 25 MG tablet Take 12.5 mg by mouth daily.   sennosides-docusate sodium (SENOKOT-S) 8.6-50 MG tablet Take 1 tablet by mouth daily.   Spacer/Aero-Holding Chambers (AEROCHAMBER MAX W/FLOW-VU) MISC by Does not apply route.   traZODone (DESYREL) 50 MG tablet Take 25 mg by mouth at bedtime.   No facility-administered encounter medications on file as of 01/09/2023.     SIGNIFICANT DIAGNOSTIC EXAMS  LABS REVIEWED; PREVIOUS    01-30-22; wbc 6.0; hgb 14.5; hct 42.1; mcv 96.3 plt 190; glucose 102; bun 21; creat 0.76; k+ 4.0; na++ 140; ca 9.3 gfr >60; protein 6.5; albumin 3.6 hgb a1c 5.2; tsh 1.695 02-24-22: wbc 6.7; hgb 15.6; hct 46.3; mcv 98.7 pt 214; glucose 132; bun 36; creat 1.01; k+ 4.3; na++ 141; ca 9.8 gfr 58 d dimer 0.42; crp 1.1  08-28-22: wbc 4.9; hgb 13.7; hct 41.4; mcv 102.2; plt 188; glucose 87; bun 42; creat 0.96 ;k+ 3.6; na++ 139; ca 9.3 gfr >60 protein 6.6 albumin 3.8 tsh 0.696 09-13-22: tsh 0.805 free t3: 2.9 free t4: 1.03  NO NEW LABS.   Review of Systems  Reason unable to perform ROS: unable to fully participate.    Physical Exam Constitutional:      General: She is not in acute distress.    Appearance: She is underweight. She is not diaphoretic.  Neck:     Thyroid: No thyromegaly.  Cardiovascular:     Rate and Rhythm: Normal rate and regular rhythm.     Pulses: Normal pulses.     Heart sounds: Normal heart sounds.  Pulmonary:     Effort: Pulmonary effort is normal. No respiratory distress.     Breath sounds: Normal breath sounds.  Abdominal:     General: Bowel sounds  are normal. There is no distension.     Palpations: Abdomen is soft.     Tenderness: There is no abdominal tenderness.  Musculoskeletal:     Cervical back: Neck supple.     Right lower leg: No edema.     Left lower leg: No edema.     Comments: Has left side  weakness Leans head to the left  Bilateral hand tremor  Has bilateral eyebrow movement Right arm shrug Has contractures     Lymphadenopathy:     Cervical: No cervical adenopathy.  Skin:    General: Skin is warm and dry.  Neurological:     Mental Status: She is alert. Mental status is at baseline.  Psychiatric:        Mood and Affect: Mood normal.       ASSESSMENT/ PLAN:  TODAY  Parkinson's disease with dyskinesia and fluctuating manifestations Delusional thoughts COPD without exacerbation   Will continue current medications Will continue current plan of care Will continue to monitor her status   Time spent with patient: 40 minutes: medications; plan of care; dietary      Synthia Innocent NP [facility]  call [phone]

## 2023-01-12 ENCOUNTER — Encounter: Payer: Self-pay | Admitting: Adult Health

## 2023-01-12 ENCOUNTER — Non-Acute Institutional Stay: Payer: Self-pay | Admitting: Adult Health

## 2023-01-12 DIAGNOSIS — G20B2 Parkinson's disease with dyskinesia, with fluctuations: Secondary | ICD-10-CM

## 2023-01-12 DIAGNOSIS — E782 Mixed hyperlipidemia: Secondary | ICD-10-CM | POA: Diagnosis not present

## 2023-01-12 DIAGNOSIS — N3281 Overactive bladder: Secondary | ICD-10-CM

## 2023-01-12 NOTE — Progress Notes (Unsigned)
Location:  [facility] Room Number: 108 Place of Service:  SNF (31)   CODE STATUS: dnr  Allergies  Allergen Reactions   Oxycodone Other (See Comments)    Can tolerate low dose mixed with tynlelol HALLUCINATIONS    Buspar [Buspirone] Other (See Comments)    Unknown   Docosahexaenoic Acid Other (See Comments)   Eicosapentaenoic Acid    Eicosapentaenoic Acid (Epa)    Lutein Nausea And Vomiting   Omega-3 Fatty Acids Other (See Comments)   Other    Aspirin Nausea And Vomiting and Other (See Comments)    Severe stomach pain    Codeine Nausea Only    Makes her feel very sick     Chief Complaint  Patient presents with   Medical Management of Chronic Issues           Parkinson's disease:     OAB (overactive bladder)    Mixed hyperlipidemia:     HPI:  She is a 77 year old long term resident of this facility being seen for the management of her chronic illnesses: Parkinson's disease:     OAB (overactive bladder)    Mixed hyperlipidemia. There are no reports of uncontrolled pain. She does have periods of crying present. She continues to slowly lose weight.   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   PONV (postoperative nausea and vomiting)    states it has been a long time since getting sick   Sleep apnea    Has CPAP but doesnt use   Stroke (HCC)    TIA (transient ischemic attack)    not really TIA but abnormal MRI per pt left sided weakness   UTI (lower urinary tract infection)    Vitamin D deficiency     Past Surgical History:  Procedure Laterality Date   BACK SURGERY     Battery change     DBS, 12/2009   BIOPSY N/A 11/29/2013   Procedure: GASTRIC  BIOPSY;  Surgeon: West Bali, MD;  Location: [facility];  Service: Endoscopy;  Laterality: N/A;   BURR HOLE W/ STEREOTACTIC INSERTION OF DBS LEADS / INTRAOP MICROELECTRODE RECORDING     2007   CARPAL TUNNEL RELEASE Right    COLONOSCOPY WITH PROPOFOL N/A 11/29/2013   Procedure: COLONOSCOPY WITH PROPOFOL;  Surgeon: West Bali, MD;  Location: [facility];  Service: Endoscopy;  Laterality: N/A;  entered cecum @ [phone]; total cecal withdrawal time 21 minutes    ELBOW SURGERY Left    after fx   ESOPHAGEAL DILATION N/A 11/29/2013   Procedure: ESOPHAGEAL DILATION;  Surgeon: West Bali, MD;  Location: [facility];  Service: Endoscopy;  Laterality: N/A;  [facility] 14/15/16   ESOPHAGOGASTRODUODENOSCOPY (EGD) WITH PROPOFOL N/A 11/29/2013   Procedure: ESOPHAGOGASTRODUODENOSCOPY (EGD) WITH PROPOFOL;  Surgeon: West Bali, MD;  Location: [facility];  Service: Endoscopy;  Laterality: N/A;   FOOT SURGERY Left    "something with 2nd 2."   NECK SURGERY     Fusion   POLYPECTOMY N/A 11/29/2013   Procedure: POLYPECTOMY;  Surgeon: West Bali, MD;  Location: [facility];  Service: Endoscopy;  Laterality: N/A;  Distal Transverse Colon x2    PR ANALYZE NEUROSTIM BRAIN, FIRST 1H  10/15/2012       PULSE GENERATOR IMPLANT Right 03/30/2018   Procedure: Right Implantable pulse generator change;  Surgeon: Maeola Harman, MD;  [facility] OR;  Service: Neurosurgery;  Laterality: Right;   SUBTHALAMIC STIMULATOR BATTERY REPLACEMENT N/A 08/05/2013   Procedure: SUBTHALAMIC STIMULATOR BATTERY REPLACEMENT;  Surgeon: Maeola Harman, MD;  Location: [facility];  Service: Neurosurgery;  Laterality: N/A;   SUBTHALAMIC STIMULATOR BATTERY REPLACEMENT N/A 11/26/2015   Procedure: Implantable pulse generator battery change for deep brain stimulator;  Surgeon: Maeola Harman, MD;  Location: [facility];  Service: Neurosurgery;  Laterality: N/A;   SUBTHALAMIC STIMULATOR BATTERY REPLACEMENT Right 09/13/2020   Procedure: Right chest implantable pulse generator  change for depleted battery;  Surgeon: Maeola Harman, MD;  Location: [facility] OR;  Service: Neurosurgery;  Laterality: Right;  3C/RM 18   VAGINAL HYSTERECTOMY     "partial"   WRIST SURGERY Right    after fx    Social History   Socioeconomic History   Marital status: Widowed    Spouse name: Not on file   Number of children: Not on file   Years of education: Not on file   Highest education level: Not on file  Occupational History   Occupation: Retired  Tobacco Use   Smoking status: Former    Current packs/day: 0.00    Average packs/day: 0.5 packs/day for 25.0 years (12.5 ttl pk-yrs)    Types: Cigarettes    Start date: 02/28/1994    Quit date: 03/01/2019    Years since quitting: 3.8   Smokeless tobacco: Never  Vaping Use   Vaping status: Never Used  Substance and Sexual Activity   Alcohol use: Not Currently   Drug use: No   Sexual activity: Not on file  Other Topics Concern   Not on file  Social History Narrative   She resides at [facility] (assisted living facility).   Social Determinants of Health   Financial Resource Strain: Low Risk  (03/29/2019)   Overall Financial Resource Strain (CARDIA)    Difficulty of Paying Living Expenses: Not very hard  Food Insecurity: Not on file  Transportation Needs: No Transportation Needs (03/29/2019)   PRAPARE - Administrator, Civil Service (Medical): No    Lack of Transportation (Non-Medical): No  Physical Activity: Not on file  Stress: Not on file  Social Connections: Moderately Isolated (03/29/2019)   Social Connection and Isolation Panel [NHANES]    Frequency of Communication with Friends and Family: More than three times a week    Frequency of Social Gatherings with Friends and Family: Once a week    Attends Religious Services: 1 to 4 times per year    Active Member of [employer] or Organizations: No    Attends Banker Meetings: Never    Marital Status: Widowed  Intimate Partner Violence: Unknown (03/29/2019)    Humiliation, Afraid, Rape, and Kick questionnaire    Fear of Current or Ex-Partner: No    Emotionally Abused: Not on file    Physically Abused: Not on file    Sexually Abused: Not on file   Family History  Problem Relation Age of Onset   Colon cancer Paternal Uncle       VITAL SIGNS BP 118/64   Pulse 68   Temp (!) 97.1 F (36.2 C)   Resp (!) 22   Ht 5\' 2"  (1.575 m)  Wt 103 lb (46.7 kg)   SpO2 97%   BMI 18.84 kg/m   Outpatient Encounter Medications as of 01/12/2023  Medication Sig   acetaminophen (TYLENOL) 650 MG CR tablet Take 650 mg by mouth 3 (three) times daily.   ALPRAZolam (XANAX) 0.25 MG tablet Take 1 tablet (0.25 mg total) by mouth daily. At 4 pm   BREZTRI AEROSPHERE 160-9-4.8 MCG/ACT AERO Inhale 2 puffs into the lungs 2 (two) times daily.   Camphor-Menthol-Methyl Sal (SALONPAS) 3.05-31-08 % [facility] Special Instructions: apply one pack to both sides of lower back daily and remove and at HS. for bilateral lower back pain. [DX: Chronic pain syndrome-per NP note]   carbidopa-levodopa (SINEMET IR) 25-100 MG tablet Take 1 tablet by mouth in the morning and at bedtime.   clopidogrel (PLAVIX) 75 MG tablet Take 75 mg by mouth daily.   DULoxetine (CYMBALTA) 60 MG capsule Take 60 mg by mouth daily. 9 am   losartan (COZAAR) 100 MG tablet Take 100 mg by mouth daily.   meloxicam (MOBIC) 7.5 MG tablet Take 7.5 mg by mouth daily.   metoprolol tartrate (LOPRESSOR) 25 MG tablet Take 25 mg by mouth 2 (two) times daily.   Nutritional Supplements (ENSURE ENLIVE PO) Take by mouth 3 (three) times daily.   omeprazole (PRILOSEC) 20 MG capsule Take 20 mg by mouth in the morning and at bedtime.    polyethylene glycol (MIRALAX / GLYCOLAX) 17 g packet Take 17 g by mouth daily.   pramipexole (MIRAPEX) 0.5 MG tablet Take 0.5 mg by mouth at bedtime.   QUEtiapine (SEROQUEL) 25 MG tablet Take 12.5 mg by mouth daily.   sennosides-docusate sodium (SENOKOT-S) 8.6-50 MG tablet Take 1 tablet by mouth daily.    Spacer/Aero-Holding Chambers (AEROCHAMBER MAX W/FLOW-VU) MISC by Does not apply route.   traZODone (DESYREL) 50 MG tablet Take 25 mg by mouth at bedtime.   No facility-administered encounter medications on file as of 01/12/2023.     SIGNIFICANT DIAGNOSTIC EXAMS  LABS REVIEWED; PREVIOUS    01-30-22; wbc 6.0; hgb 14.5; hct 42.1; mcv 96.3 plt 190; glucose 102; bun 21; creat 0.76; k+ 4.0; na++ 140; ca 9.3 gfr >60; protein 6.5; albumin 3.6 hgb a1c 5.2; tsh 1.695 02-24-22: wbc 6.7; hgb 15.6; hct 46.3; mcv 98.7 pt 214; glucose 132; bun 36; creat 1.01; k+ 4.3; na++ 141; ca 9.8 gfr 58 d dimer 0.42; crp 1.1  08-28-22: wbc 4.9; hgb 13.7; hct 41.4; mcv 102.2; plt 188; glucose 87; bun 42; creat 0.96 ;k+ 3.6; na++ 139; ca 9.3 gfr >60 protein 6.6 albumin 3.8 tsh 0.696 09-13-22: tsh 0.805 free t3: 2.9 free t4: 1.03  NO NEW LABS.    Review of Systems  Reason unable to perform ROS: unable to fulliy participate.   Physical Exam Constitutional:      General: She is not in acute distress.    Appearance: She is underweight. She is not diaphoretic.  Neck:     Thyroid: No thyromegaly.  Cardiovascular:     Rate and Rhythm: Normal rate and regular rhythm.     Pulses: Normal pulses.     Heart sounds: Normal heart sounds.  Pulmonary:     Effort: Pulmonary effort is normal. No respiratory distress.     Breath sounds: Normal breath sounds.  Abdominal:     General: Bowel sounds are normal. There is no distension.     Palpations: Abdomen is soft.     Tenderness: There is no abdominal tenderness.  Musculoskeletal:  Cervical back: Neck supple.     Right lower leg: No edema.     Left lower leg: No edema.     Comments: Has left side weakness Leans head to the left  Bilateral hand tremor  Has bilateral eyebrow movement Right arm shrug Has contractures  Lymphadenopathy:     Cervical: No cervical adenopathy.  Skin:    General: Skin is warm and dry.  Neurological:     Mental Status: She is alert. Mental  status is at baseline.  Psychiatric:        Mood and Affect: Mood normal.     ASSESSMENT/ PLAN:  TODAY  Parkinson's disease: is without change; is status post deep brain stimulator; will continue sinemet IR 25/100 mg twice daily and mirapex 0.5 mg nightly   2. OAB (overactive bladder) is off ditropan is completely incontinent of urine  3. Mixed hyperlipidemia: ldl 66; will continue to monitor   PREVIOUS     4. 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 COPD without exacerbation: will continue breztri aerosphere 160.4-4.8 mcg 2 puffs twice daily   7. GERD without esophagitis: will continue prilosec 20 mg twice daily   8. Chronic constipation: will continue colace daily; miralax daily   9. Chronic pain syndrome: will continue cymbalta 60 mg daily (also takes for mood state) salon patches to back; tylenol 650 mg three times daily   10. 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   11. Essential hypertension: b/p 118/64 will continue cozaar 100 mg daily and lopressor 25 mg twice daily    Synthia Innocent NP [name]  call [phone]

## 2023-02-10 ENCOUNTER — Encounter: Payer: Self-pay | Admitting: Internal Medicine

## 2023-02-10 ENCOUNTER — Non-Acute Institutional Stay (SKILLED_NURSING_FACILITY): Payer: Medicare Other | Admitting: Internal Medicine

## 2023-02-10 DIAGNOSIS — I10 Essential (primary) hypertension: Secondary | ICD-10-CM

## 2023-02-10 DIAGNOSIS — F323 Major depressive disorder, single episode, severe with psychotic features: Secondary | ICD-10-CM

## 2023-02-10 DIAGNOSIS — N182 Chronic kidney disease, stage 2 (mild): Secondary | ICD-10-CM | POA: Diagnosis not present

## 2023-02-10 DIAGNOSIS — R627 Adult failure to thrive: Secondary | ICD-10-CM

## 2023-02-10 DIAGNOSIS — J449 Chronic obstructive pulmonary disease, unspecified: Secondary | ICD-10-CM

## 2023-02-10 NOTE — Patient Instructions (Signed)
See assessment and plan under each diagnosis in the problem list and acutely for this visit

## 2023-02-10 NOTE — Assessment & Plan Note (Signed)
BP controlled; no change in antihypertensive medications

## 2023-02-10 NOTE — Assessment & Plan Note (Addendum)
Renal function serially stable Grade Stage 2. Mild pre -renal azotemia present. Encourage hydration.

## 2023-02-10 NOTE — Progress Notes (Signed)
   NURSING HOME LOCATION:  [facility] ROOM NUMBER:  [id number]  CODE STATUS:  DNR  PCP:  Synthia Innocent NP  This is a nursing facility follow up visit of chronic medical diagnoses & to document compliance with Regulation 483.30 (c) in The Long Term Care Survey Manual Phase 2 which mandates caregiver visit ( visits can alternate among physician, PA or NP as per statutes) within 10 days of 30 days / 60 days/ 90 days post admission to SNF date    Interim medical record and care since last SNF visit was updated with review of diagnostic studies and change in clinical status since last visit were documented.  HPI: She is a permanent resident of this facility with medical diagnoses of OSA, history of stroke, Parkinson's disease complicated by dementia, essential hypertension, dyslipidemia, GERD, and history of vitamin D deficiency. Significant surgeries and procedures stereotactic insertion of deep brain stimulator, right carpal tunnel release, and esophageal dilation. Most recent labs were completely in February of this year and revealed normal chemistries except for prerenal azotemia with a BUN of 42. No anemia was present but macrocytosis with an MCV of 102.2 was present. TSH was 0.805 with normal T3 & free T4.  Review of systems could not be completed. Today she was in a wheelchair near the nurses station having just returned from visit to be fitted for a brace for the left lower extremity. Her speech is garbled and extremely low in volume and essentially unintelligible.  There is a slight hyponasal quality as well.  She may be having constipation and some abdominal discomfort; but this could not be verified.  The only discernible comment was a request for her nurse to put her back to bed. Staff reports that she does exhibit intermittent screaming without definite stimulus or reason.  Physical exam:  Pertinent or positive findings: She appears her age and chronically ill and almost  cachectic.  Hair is thin over the crown.  Her speech pattern is as noted.  Facies are blank.  Pupils are dilated and nonreactive to light.  Her mouth is sunken; & droops on the L. Intraoral exam is markedly limited.Breath sounds are decreased.  Pedal pulses are not palpable.  She is wearing a brace on the right lower extremity.  She has a vitiliginous scarring of the left forearm.  Interosseous wasting of the hands and limb atrophy are present.  She exhibits a coarse tremor of the upper extremities greater on the left than the right.  General appearance: no acute distress, increased work of breathing is present.   Lymphatic: No lymphadenopathy about the head, neck, axilla. Eyes: No conjunctival inflammation or lid edema is present. There is no scleral icterus. Ears:  External ear exam shows no significant lesions or deformities.   Nose:  External nasal examination shows no deformity or inflammation. Nasal mucosa are pink and moist without lesions, exudates Neck:  No thyromegaly, masses, tenderness noted.    Heart:  Normal rate and regular rhythm. S1 and S2 normal without gallop, murmur, click, rub .  Lungs: no wheezes, rhonchi, rales, rubs. Abdomen: Bowel sounds are normal. Abdomen is soft and nontender with no organomegaly, hernias, masses. GU: Deferred  Extremities:  No cyanosis, clubbing, edema  Neurologic exam :Balance, Rhomberg, finger to nose testing could not be completed due to clinical state Skin: Warm & dry w/o tenting. No significant rash.  See summary under each active problem in the Problem List with associated updated therapeutic plan

## 2023-02-12 NOTE — Assessment & Plan Note (Addendum)
Staff reports that she will intermittently scream for extended periods of time without apparent stimulus or reason.  Continue to monitor on present psychotropic regimen to assess need for adjustment in meds or dosages.

## 2023-02-12 NOTE — Assessment & Plan Note (Signed)
Clinically breath sounds are decreased suggesting emphysema.  O2 sats are excellent without supplemental oxygen.  Clinically no change in pulmonary toilet indicated.  Continue to monitor.

## 2023-02-13 NOTE — Assessment & Plan Note (Signed)
She appears cachectic & has obvious muscle wasting , but clinically the FTT is insidiously progressive. Nutrition continues to follow her.

## 2023-02-19 ENCOUNTER — Other Ambulatory Visit: Payer: Self-pay | Admitting: Adult Health

## 2023-02-19 MED ORDER — ALPRAZOLAM 0.25 MG PO TABS: 0.2500 mg | ORAL_TABLET | Freq: Every day | ORAL | 0 refills | Status: DC

## 2023-03-05 ENCOUNTER — Encounter: Payer: Self-pay | Admitting: Adult Health

## 2023-03-09 ENCOUNTER — Non-Acute Institutional Stay (SKILLED_NURSING_FACILITY): Payer: Self-pay | Admitting: Adult Health

## 2023-03-09 ENCOUNTER — Encounter: Payer: Self-pay | Admitting: Adult Health

## 2023-03-09 DIAGNOSIS — F482 Pseudobulbar affect: Secondary | ICD-10-CM | POA: Diagnosis not present

## 2023-03-09 DIAGNOSIS — F323 Major depressive disorder, single episode, severe with psychotic features: Secondary | ICD-10-CM | POA: Diagnosis not present

## 2023-03-09 DIAGNOSIS — F32A Depression, unspecified: Secondary | ICD-10-CM | POA: Diagnosis not present

## 2023-03-09 DIAGNOSIS — J449 Chronic obstructive pulmonary disease, unspecified: Secondary | ICD-10-CM

## 2023-03-09 DIAGNOSIS — R45851 Suicidal ideations: Secondary | ICD-10-CM

## 2023-03-09 NOTE — Progress Notes (Unsigned)
Location:  [facility] Room Number: North/108/W Place of Service:  SNF (31)   CODE STATUS: DNR  Allergies  Allergen Reactions   Oxycodone Other (See Comments)    Can tolerate low dose mixed with tynlelol HALLUCINATIONS    Buspar [Buspirone] Other (See Comments)    Unknown   Docosahexaenoic Acid Other (See Comments)   Eicosapentaenoic Acid    Eicosapentaenoic Acid (Epa)    Lutein Nausea And Vomiting   Omega-3 Fatty Acids Other (See Comments)   Other    Aspirin Nausea And Vomiting and Other (See Comments)    Severe stomach pain    Codeine Nausea Only    Makes her feel very sick     Chief Complaint  Patient presents with   Medical Management of Chronic Issues           PBA:    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 There are no reports of uncontrolled pain. She is having more anxiety today. There are no indications of aspiration present.   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   PONV (postoperative nausea and vomiting)    states it has been a long time since getting sick   Sleep apnea    Has CPAP but doesnt use   Stroke [facility])    TIA (transient ischemic attack)    not really TIA but abnormal MRI per pt left sided weakness   UTI (lower urinary tract infection)    Vitamin D deficiency     Past Surgical History:  Procedure  Laterality Date   BACK SURGERY     Battery change     DBS, 12/2009   BIOPSY N/A 11/29/2013   Procedure: GASTRIC BIOPSY;  Surgeon: West Bali, MD;  Location: [facility];  Service: Endoscopy;  Laterality: N/A;   BURR HOLE W/ STEREOTACTIC INSERTION OF DBS LEADS / INTRAOP MICROELECTRODE RECORDING     2007   CARPAL TUNNEL RELEASE Right    COLONOSCOPY WITH PROPOFOL N/A 11/29/2013   Procedure: COLONOSCOPY WITH PROPOFOL;  Surgeon: West Bali, MD;  Location: [facility];  Service: Endoscopy;  Laterality: N/A;  entered cecum @ [phone]; total cecal withdrawal time 21 minutes    ELBOW SURGERY Left    after fx   ESOPHAGEAL DILATION N/A 11/29/2013   Procedure: ESOPHAGEAL DILATION;  Surgeon: West Bali, MD;  Location: [facility];  Service: Endoscopy;  Laterality: N/A;  [facility] 14/15/16   ESOPHAGOGASTRODUODENOSCOPY (EGD) WITH PROPOFOL N/A 11/29/2013   Procedure: ESOPHAGOGASTRODUODENOSCOPY (EGD) WITH PROPOFOL;  Surgeon: West Bali, MD;  Location: [facility];  Service: Endoscopy;  Laterality: N/A;   FOOT SURGERY Left    "something with 2nd 2."   NECK SURGERY     Fusion   POLYPECTOMY N/A 11/29/2013   Procedure: POLYPECTOMY;  Surgeon: West Bali, MD;  Location: [facility];  Service: Endoscopy;  Laterality: N/A;  Distal Transverse Colon x2    PR ANALYZE NEUROSTIM BRAIN, FIRST 1H  10/15/2012       PULSE GENERATOR IMPLANT Right 03/30/2018   Procedure: Right Implantable pulse generator change;  Surgeon: Maeola Harman, MD;  Location[name];  Service: Neurosurgery;  Laterality: Right;   SUBTHALAMIC STIMULATOR BATTERY REPLACEMENT N/A 08/05/2013   Procedure: SUBTHALAMIC STIMULATOR BATTERY REPLACEMENT;  Surgeon: Maeola Harman, MD;  Location: [facility];  Service: Neurosurgery;  Laterality: N/A;   SUBTHALAMIC STIMULATOR BATTERY REPLACEMENT N/A 11/26/2015   Procedure: Implantable pulse generator battery change for deep brain stimulator;  Surgeon: Maeola Harman, MD;  Location: [facility];  Service: Neurosurgery;  Laterality: N/A;    SUBTHALAMIC STIMULATOR BATTERY REPLACEMENT Right 09/13/2020   Procedure: Right chest implantable pulse generator change for depleted battery;  Surgeon: Maeola Harman, MD;  Location: [facility] OR;  Service: Neurosurgery;  Laterality: Right;  3C/RM 18   VAGINAL HYSTERECTOMY     "partial"   WRIST SURGERY Right    after fx    Social History   Socioeconomic History   Marital status: Widowed    Spouse name: Not on file   Number of children: Not on file   Years of education: Not on file   Highest education level: Not on file  Occupational History   Occupation: Retired  Tobacco Use   Smoking status: Former    Current packs/day: 0.00    Average packs/day: 0.5 packs/day for 25.0 years (12.5 ttl pk-yrs)    Types: Cigarettes    Start date: 02/28/1994    Quit date: 03/01/2019    Years since quitting: 4.0   Smokeless tobacco: Never  Vaping Use   Vaping status: Never Used  Substance and Sexual Activity   Alcohol use: Not Currently   Drug use: No   Sexual activity: Not on file  Other Topics Concern   Not on file  Social History Narrative   She resides at [facility] (assisted living facility).   Social Determinants of Health   Financial Resource Strain: Low Risk  (03/29/2019)   Overall Financial Resource Strain (CARDIA)    Difficulty of Paying Living Expenses: Not very hard  Food Insecurity: Not on file  Transportation Needs: No Transportation Needs (03/29/2019)   PRAPARE - Administrator, Civil Service (Medical): No    Lack of Transportation (Non-Medical): No  Physical Activity: Not on file  Stress: Not on file  Social Connections: Moderately Isolated (03/29/2019)   Social Connection and Isolation Panel [NHANES]    Frequency of Communication with Friends and Family: More than three times a week    Frequency of Social Gatherings with Friends and Family: Once a week    Attends Religious Services: 1 to 4 times per year    Active Member of [employer] or Organizations: No    Attends Occupational hygienist Meetings: Never    Marital Status: Widowed  Intimate Partner Violence: Unknown (03/29/2019)   Humiliation, Afraid, Rape, and Kick questionnaire    Fear of Current or Ex-Partner: No    Emotionally Abused: Not on file    Physically Abused: Not on file    Sexually Abused: Not on file   Family History  Problem Relation Age of Onset   Colon cancer Paternal Uncle       VITAL SIGNS BP (!) 107/50   Pulse 65   Temp (!) 97.1 F (36.2 C)  Ht 5\' 2"  (1.575 m)   Wt 110 lb (49.9 kg)   SpO2 99%   BMI 20.12 kg/m   Outpatient Encounter Medications as of 03/09/2023  Medication Sig   acetaminophen (TYLENOL) 650 MG CR tablet Take 650 mg by mouth 3 (three) times daily.   ALPRAZolam (XANAX) 0.25 MG tablet Take 1 tablet (0.25 mg total) by mouth daily. At 4 pm   BREZTRI AEROSPHERE 160-9-4.8 MCG/ACT AERO Inhale 2 puffs into the lungs 2 (two) times daily.   Camphor-Menthol-Methyl Sal (SALONPAS) 3.05-31-08 % [facility] Special Instructions: apply one pack to both sides of lower back daily and remove and at HS. for bilateral lower back pain. [DX: Chronic pain syndrome-per NP note]   carbidopa-levodopa (SINEMET IR) 25-100 MG tablet Take 1 tablet by mouth in the morning and at bedtime.   clopidogrel (PLAVIX) 75 MG tablet Take 75 mg by mouth daily.   DULoxetine (CYMBALTA) 60 MG capsule Take 60 mg by mouth daily. 9 am   losartan (COZAAR) 100 MG tablet Take 100 mg by mouth daily.   meloxicam (MOBIC) 7.5 MG tablet Take 7.5 mg by mouth daily.   metoprolol tartrate (LOPRESSOR) 25 MG tablet Take 25 mg by mouth 2 (two) times daily.   Nutritional Supplements (ENSURE ENLIVE PO) Take by mouth 3 (three) times daily.   omeprazole (PRILOSEC) 20 MG capsule Take 20 mg by mouth in the morning and at bedtime.    polyethylene glycol (MIRALAX / GLYCOLAX) 17 g packet Take 17 g by mouth daily.   pramipexole (MIRAPEX) 0.5 MG tablet Take 0.5 mg by mouth at bedtime.   QUEtiapine (SEROQUEL) 25 MG tablet Take 12.5 mg by  mouth daily.   sennosides-docusate sodium (SENOKOT-S) 8.6-50 MG tablet Take 1 tablet by mouth daily.   Spacer/Aero-Holding Chambers (AEROCHAMBER MAX W/FLOW-VU) MISC by Does not apply route.   traZODone (DESYREL) 50 MG tablet Take 25 mg by mouth at bedtime.   No facility-administered encounter medications on file as of 03/09/2023.     SIGNIFICANT DIAGNOSTIC EXAMS  LABS REVIEWED; PREVIOUS    02-24-22: wbc 6.7; hgb 15.6; hct 46.3; mcv 98.7 pt 214; glucose 132; bun 36; creat 1.01; k+ 4.3; na++ 141; ca 9.8 gfr 58 d dimer 0.42; crp 1.1  08-28-22: wbc 4.9; hgb 13.7; hct 41.4; mcv 102.2; plt 188; glucose 87; bun 42; creat 0.96 ;k+ 3.6; na++ 139; ca 9.3 gfr >60 protein 6.6 albumin 3.8 tsh 0.696 09-13-22: tsh 0.805 free t3: 2.9 free t4: 1.03  NO NEW LABS.   Review of Systems  Unable to perform ROS: Dementia   Physical Exam Constitutional:      General: She is not in acute distress.    Appearance: She is underweight. She is not diaphoretic.  Neck:     Thyroid: No thyromegaly.  Cardiovascular:     Rate and Rhythm: Normal rate and regular rhythm.     Pulses: Normal pulses.     Heart sounds: Normal heart sounds.  Pulmonary:     Effort: Pulmonary effort is normal. No respiratory distress.     Breath sounds: Normal breath sounds.  Abdominal:     General: Bowel sounds are normal. There is no distension.     Palpations: Abdomen is soft.     Tenderness: There is no abdominal tenderness.  Musculoskeletal:     Cervical back: Neck supple.     Right lower leg: No edema.     Left lower leg: No edema.     Comments:  Has left side  weakness Leans head to the left  Bilateral hand tremor  Has bilateral eyebrow movement Right arm shrug Has contractures  Lymphadenopathy:     Cervical: No cervical adenopathy.  Skin:    General: Skin is warm and dry.  Neurological:     Mental Status: She is alert. Mental status is at baseline.  Psychiatric:        Mood and Affect: Mood normal.      ASSESSMENT/  PLAN:  TODAY  PBA: is off neudexta  2. 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 Xanax 0.25 mg daily (has failed 2 weans); will continue cymbalta 60 mg daily (also takes for pain); trazodone 25 mg nightly; seroquel 12.5 mg nightly (failed one wean) unable to tolerate buspar  3. COPD without exacerbation: will continue breztri aerosphere 160.4-4.8 mcg 2 puffs twice daily   PREVIOUS      4. GERD without esophagitis: will continue prilosec 20 mg twice daily   5. Chronic constipation: will continue colace daily; miralax daily   6. Chronic pain syndrome: will continue cymbalta 60 mg daily (also takes for mood state) salon patches to back; tylenol 650 mg three times daily   7. 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   8. Essential hypertension: b/p 107/50 will continue cozaar 100 mg daily and lopressor 25 mg twice daily   9. Parkinson's disease: is without change; is status post deep brain stimulator; will continue sinemet IR 25/100 mg twice daily and mirapex 0.5 mg nightly   10. OAB (overactive bladder) is off ditropan is completely incontinent of urine  11. Mixed hyperlipidemia: ldl 66; will continue to monitor     Synthia Innocent NP [facility]  call [phone]

## 2023-03-10 ENCOUNTER — Telehealth: Payer: Self-pay | Admitting: Family

## 2023-03-10 NOTE — Telephone Encounter (Signed)
Facility Nurse called to report patient was eating her dinner in bed was observed leaning on her food tray unresponsive with eye rolled and skin color changing.Blood pressure 91/48 and oxygen saturation 98% orders given to send to ED for evaluation but Nurse states patient has a DNR and MOST form that indicates comfort care measures.Also on palliative care.patient has guardian will notify Guardian and palliative care of patient's condition.

## 2023-03-11 ENCOUNTER — Encounter: Payer: Self-pay | Admitting: Adult Health

## 2023-03-11 ENCOUNTER — Non-Acute Institutional Stay (SKILLED_NURSING_FACILITY): Payer: Medicare Other | Admitting: Adult Health

## 2023-03-11 ENCOUNTER — Other Ambulatory Visit: Payer: Self-pay | Admitting: Adult Health

## 2023-03-11 DIAGNOSIS — G20A1 Parkinson's disease without dyskinesia, without mention of fluctuations: Secondary | ICD-10-CM | POA: Diagnosis not present

## 2023-03-11 DIAGNOSIS — F02C2 Dementia in other diseases classified elsewhere, severe, with psychotic disturbance: Secondary | ICD-10-CM

## 2023-03-11 DIAGNOSIS — G20B2 Parkinson's disease with dyskinesia, with fluctuations: Secondary | ICD-10-CM

## 2023-03-11 DIAGNOSIS — R627 Adult failure to thrive: Secondary | ICD-10-CM

## 2023-03-11 MED ORDER — LORAZEPAM 2 MG/ML PO CONC: 0.5000 mg | Freq: Four times a day (QID) | ORAL | 0 refills | Status: AC | PRN

## 2023-03-11 MED ORDER — MORPHINE SULFATE (CONCENTRATE) 20 MG/ML PO SOLN: 5.0000 mg | Freq: Four times a day (QID) | ORAL | 0 refills | Status: AC

## 2023-03-11 NOTE — Progress Notes (Signed)
Location:  [facility] Room Number: 108 Place of Service:  SNF (31)   CODE STATUS: dnr   Allergies  Allergen Reactions   Oxycodone Other (See Comments)    Can tolerate low dose mixed with tynlelol HALLUCINATIONS    Buspar [Buspirone] Other (See Comments)    Unknown   Docosahexaenoic Acid Other (See Comments)   Eicosapentaenoic Acid    Eicosapentaenoic Acid (Epa)    Lutein Nausea And Vomiting   Omega-3 Fatty Acids Other (See Comments)   Other    Aspirin Nausea And Vomiting and Other (See Comments)    Severe stomach pain    Codeine Nausea Only    Makes her feel very sick     Chief Complaint  Patient presents with   Acute Visit    Change in status     HPI:  Yesterday evening she had a change in her status. She was found slumped over her overbed table with gasping respirations. Her blood pressure was soft at that time. Her MOST reads for non-aggressive care. She was placed on 02. More than likely this represents a probable MI or PE. Today she appears to be in pain with rapid respiration. She is pale with warm extremities.   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   PONV (postoperative nausea and vomiting)    states it has been a long time since getting sick   Sleep apnea    Has CPAP but doesnt use   Stroke [facility])    TIA (transient ischemic attack)    not really TIA but abnormal MRI per pt left sided weakness   UTI (lower urinary tract infection)    Vitamin D deficiency     Past Surgical History:  Procedure Laterality Date   BACK SURGERY     Battery change     DBS, 12/2009   BIOPSY N/A 11/29/2013   Procedure: GASTRIC BIOPSY;  Surgeon: West Bali, MD;  Location: [facility];  Service: Endoscopy;  Laterality: N/A;   BURR HOLE W/ STEREOTACTIC INSERTION OF DBS LEADS / INTRAOP MICROELECTRODE RECORDING     2007   CARPAL TUNNEL RELEASE Right    COLONOSCOPY WITH PROPOFOL N/A 11/29/2013   Procedure: COLONOSCOPY WITH PROPOFOL;  Surgeon: West Bali, MD;  Location: [facility];  Service: Endoscopy;  Laterality: N/A;  entered cecum @ [phone]; total cecal withdrawal time 21 minutes    ELBOW SURGERY Left    after fx   ESOPHAGEAL DILATION N/A 11/29/2013   Procedure: ESOPHAGEAL DILATION;  Surgeon: West Bali, MD;  Location: [facility];  Service: Endoscopy;  Laterality: N/A;  [facility] 14/15/16   ESOPHAGOGASTRODUODENOSCOPY (EGD) WITH PROPOFOL N/A 11/29/2013   Procedure: ESOPHAGOGASTRODUODENOSCOPY (EGD) WITH PROPOFOL;  Surgeon: West Bali, MD;  Location: [facility];  Service: Endoscopy;  Laterality: N/A;   FOOT SURGERY Left    "something with 2nd 2."   NECK SURGERY     Fusion   POLYPECTOMY N/A 11/29/2013   Procedure: POLYPECTOMY;  Surgeon: West Bali, MD;  Location: [facility];  Service: Endoscopy;  Laterality: N/A;  Distal Transverse Colon x2  PR ANALYZE NEUROSTIM BRAIN, FIRST 1H  10/15/2012       PULSE GENERATOR IMPLANT Right 03/30/2018   Procedure: Right Implantable pulse generator change;  Surgeon: Maeola Harman, MD;  Location: [facility] OR;  Service: Neurosurgery;  Laterality: Right;   SUBTHALAMIC STIMULATOR BATTERY REPLACEMENT N/A 08/05/2013   Procedure: SUBTHALAMIC STIMULATOR BATTERY REPLACEMENT;  Surgeon: Maeola Harman, MD;  Location: [facility];  Service: Neurosurgery;  Laterality: N/A;   SUBTHALAMIC STIMULATOR BATTERY REPLACEMENT N/A 11/26/2015   Procedure: Implantable pulse generator battery change for deep brain stimulator;  Surgeon: Maeola Harman, MD;  Location: [facility];  Service: Neurosurgery;  Laterality: N/A;   SUBTHALAMIC STIMULATOR BATTERY REPLACEMENT Right 09/13/2020   Procedure: Right chest implantable pulse generator change for depleted battery;   Surgeon: Maeola Harman, MD;  [facility];  Service: Neurosurgery;  Laterality: Right;  3C/RM 18   VAGINAL HYSTERECTOMY     "partial"   WRIST SURGERY Right    after fx    Social History   Socioeconomic History   Marital status: Widowed    Spouse name: Not on file   Number of children: Not on file   Years of education: Not on file   Highest education level: Not on file  Occupational History   Occupation: Retired  Tobacco Use   Smoking status: Former    Current packs/day: 0.00    Average packs/day: 0.5 packs/day for 25.0 years (12.5 ttl pk-yrs)    Types: Cigarettes    Start date: 02/28/1994    Quit date: 03/01/2019    Years since quitting: 4.0   Smokeless tobacco: Never  Vaping Use   Vaping status: Never Used  Substance and Sexual Activity   Alcohol use: Not Currently   Drug use: No   Sexual activity: Not on file  Other Topics Concern   Not on file  Social History Narrative   She resides at [facility] (assisted living facility).   Social Determinants of Health   Financial Resource Strain: Low Risk  (03/29/2019)   Overall Financial Resource Strain (CARDIA)    Difficulty of Paying Living Expenses: Not very hard  Food Insecurity: Not on file  Transportation Needs: No Transportation Needs (03/29/2019)   PRAPARE - Administrator, Civil Service (Medical): No    Lack of Transportation (Non-Medical): No  Physical Activity: Not on file  Stress: Not on file  Social Connections: Moderately Isolated (03/29/2019)   Social Connection and Isolation Panel [NHANES]    Frequency of Communication with Friends and Family: More than three times a week    Frequency of Social Gatherings with Friends and Family: Once a week    Attends Religious Services: 1 to 4 times per year    Active Member of [employer] or Organizations: No    Attends Banker Meetings: Never    Marital Status: Widowed  Intimate Partner Violence: Unknown (03/29/2019)   Humiliation, Afraid, Rape, and  Kick questionnaire    Fear of Current or Ex-Partner: No    Emotionally Abused: Not on file    Physically Abused: Not on file    Sexually Abused: Not on file   Family History  Problem Relation Age of Onset   Colon cancer Paternal Uncle       VITAL SIGNS BP (!) 122/56   Pulse 74   Temp 98.8 F (37.1 C)   Resp 20   Ht 5\' 2"  (1.575 m)   Wt 110 lb (49.9 kg)   SpO2 95%  BMI 20.12 kg/m   Outpatient Encounter Medications as of 03/11/2023  Medication Sig   Camphor-Menthol-Methyl Sal (SALONPAS) 3.05-31-08 % [facility] Special Instructions: apply one pack to both sides of lower back daily and remove and at HS. for bilateral lower back pain. [DX: Chronic pain syndrome-per NP note]   LORazepam (LORAZEPAM INTENSOL) 2 MG/ML concentrated solution Take 0.3 mLs (0.6 mg total) by mouth every 6 (six) hours as needed for anxiety.   morphine (ROXANOL) 20 MG/ML concentrated solution Take 0.25 mLs (5 mg total) by mouth every 6 (six) hours.   [DISCONTINUED] acetaminophen (TYLENOL) 650 MG CR tablet Take 650 mg by mouth 3 (three) times daily.   [DISCONTINUED] BREZTRI AEROSPHERE 160-9-4.8 MCG/ACT AERO Inhale 2 puffs into the lungs 2 (two) times daily.   [DISCONTINUED] carbidopa-levodopa (SINEMET IR) 25-100 MG tablet Take 1 tablet by mouth in the morning and at bedtime.   [DISCONTINUED] clopidogrel (PLAVIX) 75 MG tablet Take 75 mg by mouth daily.   [DISCONTINUED] DULoxetine (CYMBALTA) 60 MG capsule Take 60 mg by mouth daily. 9 am   [DISCONTINUED] losartan (COZAAR) 100 MG tablet Take 100 mg by mouth daily.   [DISCONTINUED] meloxicam (MOBIC) 7.5 MG tablet Take 7.5 mg by mouth daily.   [DISCONTINUED] metoprolol tartrate (LOPRESSOR) 25 MG tablet Take 25 mg by mouth 2 (two) times daily.   [DISCONTINUED] Nutritional Supplements (ENSURE ENLIVE PO) Take by mouth 3 (three) times daily.   [DISCONTINUED] omeprazole (PRILOSEC) 20 MG capsule Take 20 mg by mouth in the morning and at bedtime.    [DISCONTINUED] polyethylene  glycol (MIRALAX / GLYCOLAX) 17 g packet Take 17 g by mouth daily.   [DISCONTINUED] pramipexole (MIRAPEX) 0.5 MG tablet Take 0.5 mg by mouth at bedtime.   [DISCONTINUED] QUEtiapine (SEROQUEL) 25 MG tablet Take 12.5 mg by mouth daily.   [DISCONTINUED] sennosides-docusate sodium (SENOKOT-S) 8.6-50 MG tablet Take 1 tablet by mouth daily.   [DISCONTINUED] Spacer/Aero-Holding Chambers (AEROCHAMBER MAX W/FLOW-VU) MISC by Does not apply route.   [DISCONTINUED] traZODone (DESYREL) 50 MG tablet Take 25 mg by mouth at bedtime.   No facility-administered encounter medications on file as of 03/11/2023.     SIGNIFICANT DIAGNOSTIC EXAMS  LABS REVIEWED; PREVIOUS    02-24-22: wbc 6.7; hgb 15.6; hct 46.3; mcv 98.7 pt 214; glucose 132; bun 36; creat 1.01; k+ 4.3; na++ 141; ca 9.8 gfr 58 d dimer 0.42; crp 1.1  08-28-22: wbc 4.9; hgb 13.7; hct 41.4; mcv 102.2; plt 188; glucose 87; bun 42; creat 0.96 ;k+ 3.6; na++ 139; ca 9.3 gfr >60 protein 6.6 albumin 3.8 tsh 0.696 09-13-22: tsh 0.805 free t3: 2.9 free t4: 1.03  NO NEW LABS.   Review of Systems  Reason unable to perform ROS: nonverbal.   Physical Exam Constitutional:      General: She is not in acute distress.    Appearance: She is underweight. She is ill-appearing. She is not diaphoretic.  Neck:     Thyroid: No thyromegaly.  Cardiovascular:     Rate and Rhythm: Normal rate and regular rhythm.     Heart sounds: Normal heart sounds.  Pulmonary:     Effort: Pulmonary effort is normal. No respiratory distress.     Breath sounds: Normal breath sounds.  Abdominal:     General: Bowel sounds are normal. There is no distension.     Palpations: Abdomen is soft.     Tenderness: There is no abdominal tenderness.  Musculoskeletal:     Cervical back: Neck supple.     Right lower  leg: No edema.     Left lower leg: No edema.  Lymphadenopathy:     Cervical: No cervical adenopathy.  Skin:    General: Skin is warm and dry.  Neurological:     Mental Status:  She is alert.      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

## 2023-03-16 ENCOUNTER — Non-Acute Institutional Stay (SKILLED_NURSING_FACILITY): Payer: Self-pay | Admitting: Student

## 2023-03-16 DIAGNOSIS — Z515 Encounter for palliative care: Secondary | ICD-10-CM | POA: Diagnosis not present

## 2023-03-16 NOTE — Addendum Note (Signed)
Addended by: Sharee Holster on: 03/16/2023 12:55 PM   Modules accepted: Level of Service

## 2023-03-16 NOTE — Progress Notes (Signed)
Location:  [facility]   Place of Service:      CODE STATUS: Hospice  Allergies  Allergen Reactions   Oxycodone Other (See Comments)    Can tolerate low dose mixed with tynlelol HALLUCINATIONS    Buspar [Buspirone] Other (See Comments)    Unknown   Docosahexaenoic Acid Other (See Comments)   Eicosapentaenoic Acid    Eicosapentaenoic Acid (Epa)    Lutein Nausea And Vomiting   Omega-3 Fatty Acids Other (See Comments)   Other    Aspirin Nausea And Vomiting and Other (See Comments)    Severe stomach pain    Codeine Nausea Only    Makes her feel very sick     No chief complaint on file.   HPI: Alerted by nursing staff that patient appeared uncomfortable and was vocalizing more than usual. On arrival to the room she is obviously uncomfortable with exaggerated tremor of the jaw and continuous vocalization. 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   PONV (postoperative nausea and vomiting)    states it has been a long time since getting sick   Sleep apnea    Has CPAP but doesnt use   Stroke (HCC)    TIA (transient ischemic attack)    not really TIA but abnormal MRI per pt left sided weakness   UTI (lower urinary tract infection)    Vitamin D deficiency     Past Surgical History:  Procedure Laterality Date   BACK SURGERY     Battery change     DBS, 12/2009   BIOPSY N/A 11/29/2013   Procedure: GASTRIC BIOPSY;  Surgeon: West Bali, MD;  Location: [facility];  Service: Endoscopy;  Laterality: N/A;   BURR HOLE W/ STEREOTACTIC INSERTION OF DBS LEADS / INTRAOP  MICROELECTRODE RECORDING     2007   CARPAL TUNNEL RELEASE Right    COLONOSCOPY WITH PROPOFOL N/A 11/29/2013   Procedure: COLONOSCOPY WITH PROPOFOL;  Surgeon: West Bali, MD;  Location: [facility];  Service: Endoscopy;  Laterality: N/A;  entered cecum @ [phone]; total cecal withdrawal time 21 minutes    ELBOW SURGERY Left    after fx   ESOPHAGEAL DILATION N/A 11/29/2013   Procedure: ESOPHAGEAL DILATION;  Surgeon: West Bali, MD;  Location: [facility];  Service: Endoscopy;  Laterality: N/A;  [facility] 14/15/16   ESOPHAGOGASTRODUODENOSCOPY (EGD) WITH PROPOFOL N/A 11/29/2013   Procedure: ESOPHAGOGASTRODUODENOSCOPY (EGD) WITH PROPOFOL;  Surgeon: West Bali, MD;  Location: [facility];  Service: Endoscopy;  Laterality: N/A;   FOOT SURGERY Left    "something with 2nd 2."   NECK SURGERY     Fusion   POLYPECTOMY N/A 11/29/2013   Procedure: POLYPECTOMY;  Surgeon: West Bali, MD;  Location: [facility];  Service: Endoscopy;  Laterality: N/A;  Distal Transverse Colon x2    PR ANALYZE NEUROSTIM BRAIN, FIRST 1H  10/15/2012       PULSE GENERATOR IMPLANT Right 03/30/2018   Procedure: Right Implantable pulse generator change;  Surgeon: Maeola Harman, MD;  Location: [facility];  Service: Neurosurgery;  Laterality: Right;   SUBTHALAMIC STIMULATOR BATTERY REPLACEMENT N/A 08/05/2013   Procedure: SUBTHALAMIC STIMULATOR BATTERY REPLACEMENT;  Surgeon: Maeola Harman, MD;  Location: [facility];  Service: Neurosurgery;  Laterality: N/A;   SUBTHALAMIC STIMULATOR BATTERY REPLACEMENT N/A 11/26/2015   Procedure: Implantable pulse generator battery change for deep brain stimulator;  Surgeon: Maeola Harman, MD;  Location[facility];  Service: Neurosurgery;  Laterality: N/A;   SUBTHALAMIC STIMULATOR BATTERY REPLACEMENT Right 09/13/2020   Procedure: Right chest implantable pulse generator change for depleted battery;  Surgeon: Maeola Harman, MD;  Location: [facility] OR;  Service: Neurosurgery;  Laterality: Right;  3C/RM 18   VAGINAL HYSTERECTOMY      "partial"   WRIST SURGERY Right    after fx    Social History   Socioeconomic History   Marital status: Widowed    Spouse name: Not on file   Number of children: Not on file   Years of education: Not on file   Highest education level: Not on file  Occupational History   Occupation: Retired  Tobacco Use   Smoking status: Former    Current packs/day: 0.00    Average packs/day: 0.5 packs/day for 25.0 years (12.5 ttl pk-yrs)    Types: Cigarettes    Start date: 02/28/1994    Quit date: 03/01/2019    Years since quitting: 4.0   Smokeless tobacco: Never  Vaping Use   Vaping status: Never Used  Substance and Sexual Activity   Alcohol use: Not Currently   Drug use: No   Sexual activity: Not on file  Other Topics Concern   Not on file  Social History Narrative   She resides at [facility] (assisted living facility).   Social Determinants of Health   Financial Resource Strain: Low Risk  (03/29/2019)   Overall Financial Resource Strain (CARDIA)    Difficulty of Paying Living Expenses: Not very hard  Food Insecurity: Not on file  Transportation Needs: No Transportation Needs (03/29/2019)   PRAPARE - Administrator, Civil Service (Medical): No    Lack of Transportation (Non-Medical): No  Physical Activity: Not on file  Stress: Not on file  Social Connections: Moderately Isolated (03/29/2019)   Social Connection and Isolation Panel [NHANES]    Frequency of Communication with Friends and Family: More than three times a week    Frequency of Social Gatherings with Friends and Family: Once a week    Attends Religious Services: 1 to 4 times per year    Active Member of [employer] or Organizations: No    Attends Banker Meetings: Never    Marital Status: Widowed  Intimate Partner Violence: Unknown (03/29/2019)   Humiliation, Afraid, Rape, and Kick questionnaire    Fear of Current or Ex-Partner: No    Emotionally Abused: Not on file    Physically Abused: Not on file     Sexually Abused: Not on file   Family History  Problem Relation Age of Onset   Colon cancer Paternal Uncle       VITAL SIGNS There were no vitals taken for this visit.  Outpatient Encounter Medications as of 03/16/2023  Medication Sig   Camphor-Menthol-Methyl Sal (SALONPAS) 3.05-31-08 % [facility] Special Instructions: apply one pack to both sides of lower back daily and remove and at HS. for bilateral lower back pain. [DX: Chronic pain syndrome-per NP note]   LORazepam (LORAZEPAM INTENSOL) 2 MG/ML concentrated solution Take 0.3 mLs (0.6 mg total) by mouth  every 6 (six) hours as needed for anxiety.   morphine (ROXANOL) 20 MG/ML concentrated solution Take 0.25 mLs (5 mg total) by mouth every 6 (six) hours.   No facility-administered encounter medications on file as of 03/16/2023.     SIGNIFICANT DIAGNOSTIC EXAMS  Gen: Chronically ill appearing. Acutely uncomfortable.  HENT: Mucous membranes dry, exaggerated tremor of jaw  Hands: Contracted, nails digging into palms in L>R hand    ASSESSMENT/ PLAN:   End of Life Care Patient demonstrating evidence of discomfort today, with vocalizations and no longer responding to verbal cues.  - Will therefore increase frequency of her morphine dosing to 5mg  q2h PRN in addition to her scheduled dose of 5mg  q6h.  - Also has an order for lorazepam 0.6mg  q6h PRN for anxiety - Padding placed in L hand to prevent nails digging into palms and exacerbating pain  Eliezer Mccoy, MD Contact [phone] Monday through Friday 8am- 5pm  After hours call [phone]

## 2023-03-20 ENCOUNTER — Telehealth: Payer: Self-pay | Admitting: Adult Health

## 2023-03-20 NOTE — Telephone Encounter (Signed)
[employer] called to inform Courtney Tucker that they are needing the Death Certificate signed ASAP it was uploaded to the [facility] System. Please advise

## 2023-03-27 DEATH — deceased
